# Patient Record
Sex: Male | Born: 1970 | Race: Black or African American | Hispanic: No | Marital: Single | Smoking: Never smoker
Health system: Southern US, Community
[De-identification: ages and names within clinical notes are randomized; demographics above are authoritative.]

## PROBLEM LIST (undated history)

## (undated) DIAGNOSIS — M109 Gout, unspecified: Secondary | ICD-10-CM

## (undated) DIAGNOSIS — I10 Essential (primary) hypertension: Secondary | ICD-10-CM

## (undated) DIAGNOSIS — K219 Gastro-esophageal reflux disease without esophagitis: Secondary | ICD-10-CM

## (undated) DIAGNOSIS — I728 Aneurysm of other specified arteries: Secondary | ICD-10-CM

## (undated) DIAGNOSIS — D649 Anemia, unspecified: Secondary | ICD-10-CM

## (undated) DIAGNOSIS — M87051 Idiopathic aseptic necrosis of right femur: Secondary | ICD-10-CM

## (undated) DIAGNOSIS — K279 Peptic ulcer, site unspecified, unspecified as acute or chronic, without hemorrhage or perforation: Secondary | ICD-10-CM

## (undated) DIAGNOSIS — M1990 Unspecified osteoarthritis, unspecified site: Secondary | ICD-10-CM

## (undated) DIAGNOSIS — E119 Type 2 diabetes mellitus without complications: Secondary | ICD-10-CM

## (undated) HISTORY — DX: Type 2 diabetes mellitus without complications: E11.9

## (undated) HISTORY — PX: COLONOSCOPY: SHX174

## (undated) HISTORY — DX: Anemia, unspecified: D64.9

## (undated) HISTORY — PX: KNEE ARTHROSCOPY, MEDIAL PATELLO FEMORAL LIGAMENT REPAIR: SHX1890

---

## 2001-09-20 ENCOUNTER — Emergency Department (HOSPITAL_COMMUNITY): Admission: EM | Admit: 2001-09-20 | Discharge: 2001-09-20 | Payer: Self-pay | Admitting: Emergency Medicine

## 2002-09-30 ENCOUNTER — Emergency Department (HOSPITAL_COMMUNITY): Admission: EM | Admit: 2002-09-30 | Discharge: 2002-09-30 | Payer: Self-pay

## 2002-09-30 ENCOUNTER — Encounter: Payer: Self-pay | Admitting: Emergency Medicine

## 2002-10-03 ENCOUNTER — Ambulatory Visit (HOSPITAL_BASED_OUTPATIENT_CLINIC_OR_DEPARTMENT_OTHER): Admission: RE | Admit: 2002-10-03 | Discharge: 2002-10-03 | Payer: Self-pay | Admitting: Orthopedic Surgery

## 2012-04-20 ENCOUNTER — Ambulatory Visit: Payer: Self-pay | Admitting: Physician Assistant

## 2012-09-05 ENCOUNTER — Ambulatory Visit: Payer: Self-pay | Admitting: Internal Medicine

## 2012-09-12 ENCOUNTER — Emergency Department: Payer: Self-pay | Admitting: Emergency Medicine

## 2012-09-12 LAB — BASIC METABOLIC PANEL
Anion Gap: 5 — ABNORMAL LOW (ref 7–16)
BUN: 17 mg/dL (ref 7–18)
Calcium, Total: 9.3 mg/dL (ref 8.5–10.1)
Chloride: 106 mmol/L (ref 98–107)
Co2: 28 mmol/L (ref 21–32)
Creatinine: 0.98 mg/dL (ref 0.60–1.30)
EGFR (African American): >60
EGFR (Non-African Amer.): >60
Glucose: 113 mg/dL — ABNORMAL HIGH (ref 65–99)
Osmolality: 280 (ref 275–301)
Potassium: 3.9 mmol/L (ref 3.5–5.1)
Sodium: 139 mmol/L (ref 136–145)

## 2012-09-12 LAB — TROPONIN I: Troponin-I: <0.02 ng/mL

## 2012-09-12 LAB — CBC
HCT: 39.6 % — ABNORMAL LOW (ref 40.0–52.0)
HGB: 13.7 g/dL (ref 13.0–18.0)
MCH: 32.1 pg (ref 26.0–34.0)
MCHC: 34.7 g/dL (ref 32.0–36.0)
MCV: 92 fL (ref 80–100)
Platelet: 376 10*3/uL (ref 150–440)
RBC: 4.28 10*6/uL — ABNORMAL LOW (ref 4.40–5.90)
RDW: 13.4 % (ref 11.5–14.5)
WBC: 15.3 10*3/uL — ABNORMAL HIGH (ref 3.8–10.6)

## 2012-10-21 ENCOUNTER — Ambulatory Visit: Payer: Self-pay | Admitting: Internal Medicine

## 2015-03-29 ENCOUNTER — Other Ambulatory Visit: Payer: Self-pay | Admitting: Nurse Practitioner

## 2015-03-29 DIAGNOSIS — R1084 Generalized abdominal pain: Secondary | ICD-10-CM

## 2015-04-04 ENCOUNTER — Ambulatory Visit
Admission: RE | Admit: 2015-04-04 | Discharge: 2015-04-04 | Disposition: A | Discharge status: Home / Self Care | Payer: 59 | Source: Ambulatory Visit | Attending: Nurse Practitioner | Admitting: Nurse Practitioner

## 2015-04-04 ENCOUNTER — Other Ambulatory Visit: Payer: Self-pay | Admitting: Nurse Practitioner

## 2015-04-04 DIAGNOSIS — R1909 Other intra-abdominal and pelvic swelling, mass and lump: Secondary | ICD-10-CM | POA: Diagnosis not present

## 2015-04-04 DIAGNOSIS — R1084 Generalized abdominal pain: Secondary | ICD-10-CM | POA: Diagnosis present

## 2015-04-04 DIAGNOSIS — M1611 Unilateral primary osteoarthritis, right hip: Secondary | ICD-10-CM | POA: Diagnosis not present

## 2015-04-04 HISTORY — DX: Essential (primary) hypertension: I10

## 2015-04-04 MED ORDER — IOHEXOL 350 MG/ML SOLN
100.0000 mL | Freq: Once | INTRAVENOUS | Status: AC | PRN
  Administered 2015-04-04: 100 mL via INTRAVENOUS

## 2015-11-01 DIAGNOSIS — R55 Syncope and collapse: Secondary | ICD-10-CM | POA: Insufficient documentation

## 2015-11-01 DIAGNOSIS — R0602 Shortness of breath: Secondary | ICD-10-CM | POA: Insufficient documentation

## 2015-11-01 DIAGNOSIS — D649 Anemia, unspecified: Secondary | ICD-10-CM | POA: Insufficient documentation

## 2016-05-13 DIAGNOSIS — I10 Essential (primary) hypertension: Secondary | ICD-10-CM | POA: Diagnosis not present

## 2016-05-13 DIAGNOSIS — K210 Gastro-esophageal reflux disease with esophagitis: Secondary | ICD-10-CM | POA: Diagnosis not present

## 2016-05-13 DIAGNOSIS — M25551 Pain in right hip: Secondary | ICD-10-CM | POA: Diagnosis not present

## 2016-05-13 DIAGNOSIS — R634 Abnormal weight loss: Secondary | ICD-10-CM | POA: Diagnosis not present

## 2016-11-19 DIAGNOSIS — Z0001 Encounter for general adult medical examination with abnormal findings: Secondary | ICD-10-CM | POA: Diagnosis not present

## 2016-11-19 DIAGNOSIS — L03113 Cellulitis of right upper limb: Secondary | ICD-10-CM | POA: Diagnosis not present

## 2016-11-19 DIAGNOSIS — I10 Essential (primary) hypertension: Secondary | ICD-10-CM | POA: Diagnosis not present

## 2016-11-19 DIAGNOSIS — M25541 Pain in joints of right hand: Secondary | ICD-10-CM | POA: Diagnosis not present

## 2016-11-26 ENCOUNTER — Ambulatory Visit
Admission: RE | Admit: 2016-11-26 | Discharge: 2016-11-26 | Disposition: A | Discharge status: Home / Self Care | Payer: BLUE CROSS/BLUE SHIELD | Source: Ambulatory Visit | Attending: Nurse Practitioner | Admitting: Nurse Practitioner

## 2016-11-26 ENCOUNTER — Other Ambulatory Visit: Payer: Self-pay | Admitting: Nurse Practitioner

## 2016-11-26 DIAGNOSIS — M7989 Other specified soft tissue disorders: Secondary | ICD-10-CM | POA: Diagnosis not present

## 2016-11-26 DIAGNOSIS — R937 Abnormal findings on diagnostic imaging of other parts of musculoskeletal system: Secondary | ICD-10-CM | POA: Insufficient documentation

## 2016-11-26 DIAGNOSIS — M79641 Pain in right hand: Secondary | ICD-10-CM | POA: Insufficient documentation

## 2016-11-26 DIAGNOSIS — R52 Pain, unspecified: Secondary | ICD-10-CM

## 2016-11-26 DIAGNOSIS — L03113 Cellulitis of right upper limb: Secondary | ICD-10-CM | POA: Diagnosis not present

## 2016-11-26 DIAGNOSIS — M25541 Pain in joints of right hand: Secondary | ICD-10-CM | POA: Diagnosis not present

## 2016-11-26 DIAGNOSIS — I10 Essential (primary) hypertension: Secondary | ICD-10-CM | POA: Diagnosis not present

## 2016-11-26 DIAGNOSIS — R609 Edema, unspecified: Secondary | ICD-10-CM

## 2016-11-26 DIAGNOSIS — M25531 Pain in right wrist: Secondary | ICD-10-CM | POA: Diagnosis not present

## 2016-11-27 ENCOUNTER — Other Ambulatory Visit: Payer: Self-pay | Admitting: Nurse Practitioner

## 2016-11-27 DIAGNOSIS — L03113 Cellulitis of right upper limb: Secondary | ICD-10-CM

## 2016-12-01 ENCOUNTER — Ambulatory Visit: Payer: BLUE CROSS/BLUE SHIELD | Attending: Nurse Practitioner

## 2016-12-01 DIAGNOSIS — M25431 Effusion, right wrist: Secondary | ICD-10-CM | POA: Diagnosis not present

## 2016-12-01 DIAGNOSIS — M25531 Pain in right wrist: Secondary | ICD-10-CM | POA: Diagnosis not present

## 2016-12-23 DIAGNOSIS — M1A4310 Other secondary chronic gout, right wrist, without tophus (tophi): Secondary | ICD-10-CM | POA: Diagnosis not present

## 2016-12-23 DIAGNOSIS — M25541 Pain in joints of right hand: Secondary | ICD-10-CM | POA: Diagnosis not present

## 2016-12-23 DIAGNOSIS — I10 Essential (primary) hypertension: Secondary | ICD-10-CM | POA: Diagnosis not present

## 2017-01-10 ENCOUNTER — Encounter (HOSPITAL_COMMUNITY): Payer: Self-pay

## 2017-01-10 ENCOUNTER — Emergency Department (HOSPITAL_COMMUNITY)
Admission: EM | Admit: 2017-01-10 | Discharge: 2017-01-10 | Disposition: A | Discharge status: Home / Self Care | Payer: BLUE CROSS/BLUE SHIELD | Attending: Emergency Medicine | Admitting: Emergency Medicine

## 2017-01-10 ENCOUNTER — Other Ambulatory Visit: Payer: Self-pay

## 2017-01-10 ENCOUNTER — Emergency Department (HOSPITAL_COMMUNITY): Payer: BLUE CROSS/BLUE SHIELD

## 2017-01-10 DIAGNOSIS — Y998 Other external cause status: Secondary | ICD-10-CM | POA: Insufficient documentation

## 2017-01-10 DIAGNOSIS — Y939 Activity, unspecified: Secondary | ICD-10-CM | POA: Diagnosis not present

## 2017-01-10 DIAGNOSIS — W25XXXA Contact with sharp glass, initial encounter: Secondary | ICD-10-CM | POA: Insufficient documentation

## 2017-01-10 DIAGNOSIS — Y929 Unspecified place or not applicable: Secondary | ICD-10-CM | POA: Diagnosis not present

## 2017-01-10 DIAGNOSIS — S51812A Laceration without foreign body of left forearm, initial encounter: Secondary | ICD-10-CM | POA: Diagnosis not present

## 2017-01-10 MED ORDER — LIDOCAINE HCL (PF) 1 % IJ SOLN
30.0000 mL | Freq: Once | INTRAMUSCULAR | Status: DC
  Filled 2017-01-10: qty 30

## 2017-01-10 MED ORDER — CEPHALEXIN 500 MG PO CAPS: 500.0000 mg | ORAL_CAPSULE | Freq: Four times a day (QID) | ORAL | 0 refills | Status: AC

## 2017-01-10 NOTE — ED Notes (Signed)
Patient transported to [facility] 
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
## 2017-01-18 ENCOUNTER — Encounter (INDEPENDENT_AMBULATORY_CARE_PROVIDER_SITE_OTHER): Payer: Self-pay | Admitting: Orthopaedic Surgery

## 2017-01-18 ENCOUNTER — Ambulatory Visit (INDEPENDENT_AMBULATORY_CARE_PROVIDER_SITE_OTHER): Payer: BLUE CROSS/BLUE SHIELD | Admitting: Orthopaedic Surgery

## 2017-01-18 DIAGNOSIS — S41112A Laceration without foreign body of left upper arm, initial encounter: Secondary | ICD-10-CM | POA: Diagnosis not present

## 2017-01-18 NOTE — Progress Notes (Signed)
The patient I am seeing today for the first time 8 days out from a laceration to his left forearm near the antecubital fossa but more radial and dorsal.  This laceration was repaired by the ER staff since there was no tenderness ligamentous vascular or neuro injury.  The incision was closed with small interrupted 4 oh looks like Prolene sutures.  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  He does have history of gout but no diabetes  He denies any fever, chills, nausea, vomiting or chest pain.  He denies any numbness and tingling in his hand.  On examination the incision looks well-healed.  The sutures are very teeny side to remove all of but is very meticulous because I feel that the used to small suture for such a laceration that needs only a small amount of sutures.  Distally his motor sensory exam were entirely normal there is no evidence of infection.  At this point I placed Steri-Strips and let them know that it is gaped open at all that he can treat it with just local wound care.  All questions concerns were answered and addressed.  Follow-up as needed.

## 2017-02-02 DIAGNOSIS — M87051 Idiopathic aseptic necrosis of right femur: Secondary | ICD-10-CM

## 2017-02-02 HISTORY — DX: Idiopathic aseptic necrosis of right femur: M87.051

## 2017-03-09 ENCOUNTER — Other Ambulatory Visit: Payer: Self-pay

## 2017-03-09 MED ORDER — COLCHICINE 0.6 MG PO TABS: 0.6000 mg | ORAL_TABLET | Freq: Every day | ORAL | 0 refills | Status: DC | PRN

## 2017-03-15 ENCOUNTER — Ambulatory Visit: Payer: BLUE CROSS/BLUE SHIELD | Admitting: Nurse Practitioner

## 2017-03-15 ENCOUNTER — Encounter: Payer: Self-pay | Admitting: Nurse Practitioner

## 2017-03-15 VITALS — BP 124/102 | HR 60 | Resp 16 | Ht 72.0 in | Wt 202.0 lb

## 2017-03-15 DIAGNOSIS — Z8711 Personal history of peptic ulcer disease: Secondary | ICD-10-CM | POA: Diagnosis not present

## 2017-03-15 DIAGNOSIS — M87051 Idiopathic aseptic necrosis of right femur: Secondary | ICD-10-CM | POA: Diagnosis not present

## 2017-03-15 DIAGNOSIS — I10 Essential (primary) hypertension: Secondary | ICD-10-CM

## 2017-03-15 MED ORDER — PREDNISONE 10 MG (21) PO TBPK: ORAL_TABLET | ORAL | 0 refills | Status: DC

## 2017-03-15 NOTE — Progress Notes (Signed)
[facility], [place] [address]  Internal MEDICINE  Office Visit Note  Patient Name: Evan Sullivan  [id number]  [id number]  Date of Service: 03/15/2017  Chief Complaint  Patient presents with  . Hip Pain    right side pain is going on since last thursday     Hip Pain   The incident occurred 2 days ago. The incident occurred at home. There was no injury mechanism. The pain is present in the right hip. The quality of the pain is described as aching. Associated symptoms include an inability to bear weight, a loss of motion and muscle weakness. Pertinent negatives include no numbness. The symptoms are aggravated by movement and weight bearing. He has tried immobilization (using crutches to help with mobility) for the symptoms. The treatment provided mild relief.   Pt is here for a sick visit.     Current Medication:  Outpatient Encounter Medications as of 03/15/2017  Medication Sig Note  . atenolol (TENORMIN) 100 MG tablet Take 100 mg by mouth daily.   . colchicine 0.6 MG tablet Take 1 tablet (0.6 mg total) by mouth daily as needed (gout attack).   . hydrochlorothiazide (HYDRODIURIL) 25 MG tablet Take 25 mg by mouth daily.   . pantoprazole (PROTONIX) 40 MG tablet Take 40 mg by mouth daily.   Marland Kitchen ULORIC 40 MG tablet Take 20 mg by mouth daily. 01/10/2017: X 14 days  . predniSONE (STERAPRED UNI-PAK 21 TAB) 10 MG (21) TBPK tablet 6 day taper - take by mouth as directed for 6 days    No facility-administered encounter medications on file as of 03/15/2017.       Medical History: Past Medical History:  Diagnosis Date  . Hypertension      Today's Vitals   03/15/17 1020  BP: (!) 124/102  Pulse: 60  Resp: 16  SpO2: 99%  Weight: 202 lb (91.6 kg)  Height: 6' (1.829 m)    Review of Systems  Constitutional: Positive for activity change. Negative for chills, fatigue and unexpected weight change.  HENT: Negative for congestion, postnasal drip,  rhinorrhea, sneezing and sore throat.   Eyes: Negative.  Negative for redness.  Respiratory: Negative for cough, chest tightness and shortness of breath.   Cardiovascular: Negative for chest pain and palpitations.  Gastrointestinal: Negative for abdominal pain, constipation, diarrhea, nausea and vomiting.  Endocrine: Negative for cold intolerance, heat intolerance, polydipsia and polyphagia.  Genitourinary: Negative for dysuria and frequency.  Musculoskeletal: Positive for arthralgias. Negative for back pain, joint swelling and neck pain.       Moderate to severe right hip pain. Getting slightly better since using crutch to support his weight.   Skin: Negative for rash.  Neurological: Negative.  Negative for tremors, numbness and headaches.  Hematological: Negative for adenopathy. Does not bruise/bleed easily.  Psychiatric/Behavioral: Negative for behavioral problems (Depression), sleep disturbance and suicidal ideas.    Physical Exam  Constitutional: He is oriented to person, place, and time. He appears well-developed and well-nourished.  HENT:  Head: Normocephalic.  Eyes: Pupils are equal, round, and reactive to light.  Neck: Normal range of motion. Neck supple.  Cardiovascular: Normal rate and regular rhythm.  Pulmonary/Chest: Effort normal and breath sounds normal.  Abdominal: Soft. Bowel sounds are normal. There is no tenderness.  Musculoskeletal:       Right hip: He exhibits decreased range of motion and decreased strength.       Legs: Neurological: He is alert and oriented to  person, place, and time.  Skin: Skin is warm and dry.  Psychiatric: He has a normal mood and affect. His behavior is normal. Judgment and thought content normal.  Nursing note and vitals reviewed.   Assessment/Plan: 1. Avascular necrosis of bone of hip, right ([place]) X-[place] done in 2017 per orthopedics, concerning for avascular necrosis. Patient admits decreasing alcohol intake since that visit. Will refer  back to Vance Peper, Pac for further evaluation and treatment.  - predniSONE (STERAPRED UNI-PAK 21 TAB) 10 MG (21) TBPK tablet; 6 day taper - take by mouth as directed for 6 days  Dispense: 21 tablet; Refill: 0 - Ambulatory referral to Orthopedic Surgery  2. Essential hypertension Stable. continue bp medication as prescribed.   3. History of peptic ulcer disease Continue pantoprazole as presribed.   General Counseling: [name] verbalizes understanding of the findings of todays visit and agrees with plan of treatment. I have discussed any further diagnostic evaluation that may be needed or ordered today. We also reviewed his medications today. he has been encouraged to call the office with any questions or concerns that should arise related to todays visit.   This patient was seen by Evan Pol, FNP- C in Collaboration with Dr Evan Sullivan as a part of collaborative care agreement    Orders Placed This Encounter  Procedures  . Ambulatory referral to Orthopedic Surgery    Meds ordered this encounter  Medications  . predniSONE (STERAPRED UNI-PAK 21 TAB) 10 MG (21) TBPK tablet    Sig: 6 day taper - take by mouth as directed for 6 days    Dispense:  21 tablet    Refill:  0    Order Specific Question:   Supervising Provider    Answer:   Evan Sullivan [[id number]]    Time spent: 15 Minutes

## 2017-03-16 DIAGNOSIS — M87051 Idiopathic aseptic necrosis of right femur: Secondary | ICD-10-CM | POA: Diagnosis not present

## 2017-03-16 DIAGNOSIS — M25551 Pain in right hip: Secondary | ICD-10-CM | POA: Diagnosis not present

## 2017-03-18 DIAGNOSIS — M167 Other unilateral secondary osteoarthritis of hip: Secondary | ICD-10-CM | POA: Diagnosis not present

## 2017-03-18 DIAGNOSIS — M87051 Idiopathic aseptic necrosis of right femur: Secondary | ICD-10-CM | POA: Diagnosis not present

## 2017-04-15 ENCOUNTER — Other Ambulatory Visit: Payer: Self-pay

## 2017-04-15 MED ORDER — HYDROCHLOROTHIAZIDE 25 MG PO TABS: 25.0000 mg | ORAL_TABLET | Freq: Every day | ORAL | 1 refills | Status: DC

## 2017-04-15 MED ORDER — ATENOLOL 100 MG PO TABS: 100.0000 mg | ORAL_TABLET | Freq: Every day | ORAL | 1 refills | Status: DC

## 2017-04-21 ENCOUNTER — Encounter
Admission: RE | Admit: 2017-04-21 | Discharge: 2017-04-21 | Disposition: A | Discharge status: Home / Self Care | Payer: BLUE CROSS/BLUE SHIELD | Source: Ambulatory Visit | Attending: Orthopedic Surgery | Admitting: Orthopedic Surgery

## 2017-04-21 ENCOUNTER — Other Ambulatory Visit: Payer: Self-pay

## 2017-04-21 DIAGNOSIS — R9431 Abnormal electrocardiogram [ECG] [EKG]: Secondary | ICD-10-CM | POA: Insufficient documentation

## 2017-04-21 DIAGNOSIS — Z01812 Encounter for preprocedural laboratory examination: Secondary | ICD-10-CM | POA: Insufficient documentation

## 2017-04-21 DIAGNOSIS — Z01810 Encounter for preprocedural cardiovascular examination: Secondary | ICD-10-CM | POA: Diagnosis not present

## 2017-04-21 DIAGNOSIS — I10 Essential (primary) hypertension: Secondary | ICD-10-CM | POA: Insufficient documentation

## 2017-04-21 HISTORY — DX: Gout, unspecified: M10.9

## 2017-04-21 HISTORY — DX: Unspecified osteoarthritis, unspecified site: M19.90

## 2017-04-21 LAB — COMPREHENSIVE METABOLIC PANEL
ALT: 16 U/L — ABNORMAL LOW (ref 17–63)
AST: 21 U/L (ref 15–41)
Albumin: 4.2 g/dL (ref 3.5–5.0)
Alkaline Phosphatase: 63 U/L (ref 38–126)
Anion gap: 9 (ref 5–15)
BUN: 13 mg/dL (ref 6–20)
CO2: 26 mmol/L (ref 22–32)
Calcium: 9.9 mg/dL (ref 8.9–10.3)
Chloride: 103 mmol/L (ref 101–111)
Creatinine, Ser: 0.93 mg/dL (ref 0.61–1.24)
GFR calc Af Amer: >60 mL/min (ref >60)
GFR calc non Af Amer: >60 mL/min (ref >60)
Glucose, Bld: 108 mg/dL — ABNORMAL HIGH (ref 65–99)
Potassium: 3.7 mmol/L (ref 3.5–5.1)
Sodium: 138 mmol/L (ref 135–145)
Total Bilirubin: 0.7 mg/dL (ref 0.3–1.2)
Total Protein: 7.9 g/dL (ref 6.5–8.1)

## 2017-04-21 LAB — URINALYSIS, ROUTINE W REFLEX MICROSCOPIC
Bacteria, UA: NONE SEEN
Bilirubin Urine: NEGATIVE
Glucose, UA: NEGATIVE mg/dL
Hgb urine dipstick: NEGATIVE
Ketones, ur: NEGATIVE mg/dL
Leukocytes, UA: NEGATIVE
Nitrite: NEGATIVE
Protein, ur: 100 mg/dL — AB
RBC / HPF: NONE SEEN RBC/hpf (ref 0–5)
Specific Gravity, Urine: 1.016 (ref 1.005–1.030)
WBC, UA: NONE SEEN WBC/hpf (ref 0–5)
pH: 5 (ref 5.0–8.0)

## 2017-04-21 LAB — CBC
HCT: 45 % (ref 40.0–52.0)
Hemoglobin: 15.5 g/dL (ref 13.0–18.0)
MCH: 31.9 pg (ref 26.0–34.0)
MCHC: 34.5 g/dL (ref 32.0–36.0)
MCV: 92.5 fL (ref 80.0–100.0)
Platelets: 299 10*3/uL (ref 150–440)
RBC: 4.86 MIL/uL (ref 4.40–5.90)
RDW: 13.5 % (ref 11.5–14.5)
WBC: 7 10*3/uL (ref 3.8–10.6)

## 2017-04-21 LAB — SURGICAL PCR SCREEN
MRSA, PCR: NEGATIVE
Staphylococcus aureus: NEGATIVE

## 2017-04-21 LAB — APTT: aPTT: 32 seconds (ref 24–36)

## 2017-04-21 LAB — TYPE AND SCREEN
ABO/RH(D): O POS
Antibody Screen: NEGATIVE

## 2017-04-21 LAB — C-REACTIVE PROTEIN: CRP: 0.8 mg/dL (ref <1.0)

## 2017-04-21 LAB — SEDIMENTATION RATE: Sed Rate: 29 mm/hr — ABNORMAL HIGH (ref 0–15)

## 2017-04-21 LAB — PROTIME-INR
INR: 0.92
Prothrombin Time: 12.3 seconds (ref 11.4–15.2)

## 2017-04-21 NOTE — Patient Instructions (Signed)
Your procedure is scheduled on: 05/03/17 Mon Report to Same Day Surgery 2nd floor medical mall [facility] Entrance-take elevator on left to 2nd floor.  Check in with surgery information desk.) To find out your arrival time please call [phone] between 1PM - 3PM on 04/30/17 Fri  Remember: Instructions that are not followed completely may result in serious medical risk, up to and including death, or upon the discretion of your surgeon and anesthesiologist your surgery may need to be rescheduled.    _x___ 1. Do not eat food after midnight the night before your procedure. You may drink clear liquids up to 2 hours before you are scheduled to arrive at the hospital for your procedure.  Do not drink clear liquids within 2 hours of your scheduled arrival to the hospital.  Clear liquids include  --Water or Apple juice without pulp  --Clear carbohydrate beverage such as ClearFast or Gatorade  --Black Coffee or Clear Tea (No milk, no creamers, do not add anything to                  the coffee or Tea Type 1 and type 2 diabetics should only drink water.  No gum chewing or hard candies.     __x__ 2. No Alcohol for 24 hours before or after surgery.   __x__3. No Smoking or e-cigarettes for 24 prior to surgery.  Do not use any chewable tobacco products for at least 6 hour prior to surgery   ____  4. Bring all medications with you on the day of surgery if instructed.    __x__ 5. Notify your doctor if there is any change in your medical condition     (cold, fever, infections).    x___6. On the morning of surgery brush your teeth with toothpaste and water.  You may rinse your mouth with mouth wash if you wish.  Do not swallow any toothpaste or mouthwash.   Do not wear jewelry, make-up, hairpins, clips or nail polish.  Do not wear lotions, powders, or perfumes. You may wear deodorant.  Do not shave 48 hours prior to surgery. Men may shave face and neck.  Do not bring valuables to the hospital.     [facility] is not responsible for any belongings or valuables.               Contacts, dentures or bridgework may not be worn into surgery.  Leave your suitcase in the car. After surgery it may be brought to your room.  For patients admitted to the hospital, discharge time is determined by your                       treatment team.  _  Patients discharged the day of surgery will not be allowed to drive home.  You will need someone to drive you home and stay with you the night of your procedure.    Please read over the following fact sheets that you were given:   [facility] Preparing for Surgery and or MRSA Information   _x___ Take anti-hypertensive listed below, cardiac, seizure, asthma,     anti-reflux and psychiatric medicines. These include:  1. atenolol (TENORMIN) 100 MG tablet  2.pantoprazole (PROTONIX) 40 MG tablet  3.  4.  5.  6.  ____Fleets enema or Magnesium Citrate as directed.   _x___ Use CHG Soap or sage wipes as directed on instruction sheet   ____ Use inhalers on the day of surgery  and bring to hospital day of surgery  ____ Stop Metformin and Janumet 2 days prior to surgery.    ____ Take 1/2 of usual insulin dose the night before surgery and none on the morning     surgery.   _x___ Follow recommendations from Cardiologist, Pulmonologist or PCP regarding          stopping Aspirin, Coumadin, Plavix ,Eliquis, Effient, or Pradaxa, and Pletal.  X____Stop Anti-inflammatories such as Advil, Aleve, Ibuprofen, Motrin, Naproxen, Naprosyn, Goodies powders or aspirin products. OK to take Tylenol and                          Celebrex.   _x___ Stop supplements until after surgery.  But may continue Vitamin D, Vitamin B,       and multivitamin.   ____ Bring C-Pap to the hospital.

## 2017-04-21 NOTE — Pre-Procedure Instructions (Addendum)
AS INSTRUCTED BY DR Ronelle Nigh, EKG/CARDIAC CLEARANCE REQUEST FAXED TO DR Hans Eden. LM FOR TIFFANY

## 2017-04-22 LAB — URINE CULTURE
Culture: NO GROWTH
Special Requests: NORMAL

## 2017-04-23 DIAGNOSIS — I208 Other forms of angina pectoris: Secondary | ICD-10-CM | POA: Diagnosis not present

## 2017-04-23 DIAGNOSIS — R0602 Shortness of breath: Secondary | ICD-10-CM | POA: Diagnosis not present

## 2017-05-03 ENCOUNTER — Encounter: Admission: RE | Payer: Self-pay | Source: Ambulatory Visit

## 2017-05-03 ENCOUNTER — Inpatient Hospital Stay
Admission: RE | Admit: 2017-05-03 | Payer: BLUE CROSS/BLUE SHIELD | Source: Ambulatory Visit | Admitting: Orthopedic Surgery

## 2017-05-03 SURGERY — ARTHROPLASTY, HIP, TOTAL,POSTERIOR APPROACH
Anesthesia: Choice | Laterality: Right

## 2017-05-10 DIAGNOSIS — I208 Other forms of angina pectoris: Secondary | ICD-10-CM | POA: Diagnosis not present

## 2017-05-10 DIAGNOSIS — R0602 Shortness of breath: Secondary | ICD-10-CM | POA: Diagnosis not present

## 2017-05-24 ENCOUNTER — Other Ambulatory Visit: Payer: Self-pay

## 2017-05-24 MED ORDER — COLCHICINE 0.6 MG PO TABS: 0.6000 mg | ORAL_TABLET | Freq: Every day | ORAL | 0 refills | Status: DC | PRN

## 2017-05-26 ENCOUNTER — Telehealth: Payer: Self-pay | Admitting: Nurse Practitioner

## 2017-05-26 ENCOUNTER — Other Ambulatory Visit: Payer: Self-pay | Admitting: Nurse Practitioner

## 2017-05-26 MED ORDER — COLCHICINE 0.6 MG PO CAPS: 1.0000 | ORAL_CAPSULE | Freq: Every day | ORAL | 1 refills | Status: DC

## 2017-05-26 NOTE — Telephone Encounter (Signed)
Prior authorization is approved for Mitigare 0.6

## 2017-05-26 NOTE — Telephone Encounter (Signed)
Prior authorization for colchicine has been approved.

## 2017-07-20 ENCOUNTER — Encounter
Admission: RE | Admit: 2017-07-20 | Discharge: 2017-07-20 | Disposition: A | Discharge status: Home / Self Care | Payer: BLUE CROSS/BLUE SHIELD | Source: Ambulatory Visit | Attending: Orthopedic Surgery | Admitting: Orthopedic Surgery

## 2017-07-20 ENCOUNTER — Other Ambulatory Visit: Payer: Self-pay

## 2017-07-20 DIAGNOSIS — Z01812 Encounter for preprocedural laboratory examination: Secondary | ICD-10-CM | POA: Diagnosis not present

## 2017-07-20 HISTORY — DX: Idiopathic aseptic necrosis of right femur: M87.051

## 2017-07-20 HISTORY — DX: Gastro-esophageal reflux disease without esophagitis: K21.9

## 2017-07-20 HISTORY — DX: Peptic ulcer, site unspecified, unspecified as acute or chronic, without hemorrhage or perforation: K27.9

## 2017-07-20 LAB — COMPREHENSIVE METABOLIC PANEL
ALT: 21 U/L (ref 17–63)
AST: 24 U/L (ref 15–41)
Albumin: 4.3 g/dL (ref 3.5–5.0)
Alkaline Phosphatase: 62 U/L (ref 38–126)
Anion gap: 9 (ref 5–15)
BUN: 16 mg/dL (ref 6–20)
CO2: 24 mmol/L (ref 22–32)
Calcium: 9.8 mg/dL (ref 8.9–10.3)
Chloride: 105 mmol/L (ref 101–111)
Creatinine, Ser: 0.94 mg/dL (ref 0.61–1.24)
GFR calc Af Amer: >60 mL/min (ref >60)
GFR calc non Af Amer: >60 mL/min (ref >60)
Glucose, Bld: 110 mg/dL — ABNORMAL HIGH (ref 65–99)
Potassium: 3.7 mmol/L (ref 3.5–5.1)
Sodium: 138 mmol/L (ref 135–145)
Total Bilirubin: 0.7 mg/dL (ref 0.3–1.2)
Total Protein: 8.3 g/dL — ABNORMAL HIGH (ref 6.5–8.1)

## 2017-07-20 LAB — URINALYSIS, ROUTINE W REFLEX MICROSCOPIC
Bacteria, UA: NONE SEEN
Bilirubin Urine: NEGATIVE
Glucose, UA: NEGATIVE mg/dL
Hgb urine dipstick: NEGATIVE
Ketones, ur: NEGATIVE mg/dL
Leukocytes, UA: NEGATIVE
Nitrite: NEGATIVE
Protein, ur: 100 mg/dL — AB
Specific Gravity, Urine: 1.019 (ref 1.005–1.030)
pH: 5 (ref 5.0–8.0)

## 2017-07-20 LAB — TYPE AND SCREEN
ABO/RH(D): O POS
Antibody Screen: NEGATIVE

## 2017-07-20 LAB — APTT: aPTT: 32 seconds (ref 24–36)

## 2017-07-20 LAB — SURGICAL PCR SCREEN
MRSA, PCR: NEGATIVE
Staphylococcus aureus: NEGATIVE

## 2017-07-20 LAB — CBC
HCT: 45.3 % (ref 40.0–52.0)
Hemoglobin: 15.7 g/dL (ref 13.0–18.0)
MCH: 32.6 pg (ref 26.0–34.0)
MCHC: 34.7 g/dL (ref 32.0–36.0)
MCV: 94 fL (ref 80.0–100.0)
Platelets: 284 10*3/uL (ref 150–440)
RBC: 4.81 MIL/uL (ref 4.40–5.90)
RDW: 13.5 % (ref 11.5–14.5)
WBC: 6.6 10*3/uL (ref 3.8–10.6)

## 2017-07-20 LAB — PROTIME-INR
INR: 0.86
Prothrombin Time: 11.6 seconds (ref 11.4–15.2)

## 2017-07-20 LAB — SEDIMENTATION RATE: Sed Rate: 17 mm/hr — ABNORMAL HIGH (ref 0–15)

## 2017-07-20 LAB — C-REACTIVE PROTEIN: CRP: <0.8 mg/dL (ref <1.0)

## 2017-07-20 NOTE — Patient Instructions (Signed)
Your procedure is scheduled on: Monday, August 02, 2017  Report to [place]    DO NOT STOP ON THE FIRST FLOOR TO REGISTER  To find out your arrival time please call [phone] between 1PM - 3PM on Friday, July 30, 2017  Remember: Instructions that are not followed completely may result in serious medical risk, p to and including death, or upon the discretion of your surgeon and anesthesiologist your  surgery may need to be rescheduled.     _X__ 1. Do not eat food after midnight the night before your procedure.                 No gum chewing or hard candies. NOTHING SOLID IN YOUR MOUTH                     AFTER MIDNIGHT.                 You may drink clear liquids up to 2 hours before you are scheduled to arrive for your surgery                DO not drink clear liquids within 2 hours of the start of your surgery.                  Clear Liquids include:  water, apple juice without pulp, clear carbohydrate                 drink such as Clearfast of Gatorade, Black Coffee or Tea (Do not add                 anything to coffee or tea).  __X__2.  On the morning of surgery brush your teeth with toothpaste and water,                     you  may rinse your mouth with mouthwash if you wish.                         Do not swallow any toothpaste of mouthwash.     _X__ 3.  No Alcohol for 24 hours before or after surgery.   __ 4.  Do Not Smoke or use e-cigarettes For 24 Hours Prior to Your Surgery.                 Do not use any chewable tobacco products for at least 6 hours prior to                 surgery.  ____  5.  Bring all medications with you on the day of surgery if instructed.   __x__  6.  Notify your doctor if there is any change in your medical condition      (cold, fever, infections).     Do not wear jewelry, make-up, hairpins, clips or nail polish. Do not wear lotions, powders, or perfumes. You may wear deodorant. Do not shave  48 hours prior to surgery. Men may shave face and neck. Do not bring valuables to the hospital.    [facility] is not responsible for any belongings or valuables.  Contacts, dentures or bridgework may not be worn into surgery. Leave your suitcase in the car. After surgery it may be brought to your room. For patients admitted to the hospital, discharge time is determined by your treatment team.   Patients discharged the  day of surgery will not be allowed to drive home.   Please read over the following fact sheets that you were given:   Preparing for surgery    ____ Take these medicines the morning of surgery with A SIP OF WATER:    1. PANTOPRAZOLE  2. ATENOLOL  3. COLCHICINE, ONLY IF YOU HAVE A CURRENT GOUT OUTBREAK  4.  5.  6.  ____ Fleet Enema (as directed)   __X__ Use CHG Soap as directed  ____ Use inhalers on the day of surgery  ____ Stop ALL ASPIRIN PRODUCTS TODAY.                                   YOU MAY TAKE TYLENOL AT ANY TIME   ____ Stop Anti-inflammatories AS OF TODAY   ____ Stop supplements until after surgery.    ____ Bring C-Pap to the hospital.   HAVE STOOL SOFTENERS READY FOR USE UPON DISCHARGE.       YOU SHOULD START THIS ON THE DAY BEFORE SURGERY BUT DO NOT             TAKE ON THE DAY OF SURGERY.  Arnold

## 2017-07-20 NOTE — Pre-Procedure Instructions (Signed)
BP remained elevated during pre-op interview.  Left room and allowed patient to recline and relax to see if numbers would go down. States he took his bp medicine this morning. Repeat blood pressure after quiet and calm environment remains with diastolic over 327.  Faxed request to [facility] for medical clearance

## 2017-07-21 LAB — URINE CULTURE
Culture: <10000 — AB
Special Requests: NORMAL

## 2017-07-28 ENCOUNTER — Encounter: Payer: Self-pay | Admitting: Internal Medicine

## 2017-07-28 ENCOUNTER — Ambulatory Visit: Payer: BLUE CROSS/BLUE SHIELD | Admitting: Internal Medicine

## 2017-07-28 VITALS — BP 170/100 | HR 63 | Resp 16 | Ht 72.0 in | Wt 210.4 lb

## 2017-07-28 DIAGNOSIS — I10 Essential (primary) hypertension: Secondary | ICD-10-CM | POA: Diagnosis not present

## 2017-07-28 DIAGNOSIS — M87051 Idiopathic aseptic necrosis of right femur: Secondary | ICD-10-CM | POA: Diagnosis not present

## 2017-07-28 MED ORDER — AMLODIPINE BESYLATE 5 MG PO TABS: 5.0000 mg | ORAL_TABLET | Freq: Every day | ORAL | 3 refills | Status: DC

## 2017-07-28 NOTE — Progress Notes (Signed)
[facility] [place], [place] [address]  Internal MEDICINE  Office Visit Note  Patient Name: Evan Sullivan  [id number]  [id number]  Date of Service: 08/16/2017  Chief Complaint  Patient presents with  . Hypertension    BP is uncontrolled, pt needs surgical clearance  Pt is here for routine follow up. His bp is not well controlled, he is scheduled to have hip arthoplasty next week, pt admits taking his Atenolol. C/o ED.  Hypertension  This is a chronic problem. The current episode started more than 1 year ago. The problem has been waxing and waning since onset. The problem is uncontrolled. Associated symptoms include headaches and shortness of breath. Pertinent negatives include no chest pain, neck pain or palpitations. Compliance problems include medication side effects.    Current Medication: Outpatient Encounter Medications as of 07/28/2017  Medication Sig  . acetaminophen (TYLENOL) 500 MG tablet Take 1,000 mg by mouth daily as needed for moderate pain.  Marland Kitchen atenolol (TENORMIN) 100 MG tablet Take 1 tablet (100 mg total) by mouth daily.  . colchicine (COLCRYS) 0.6 MG tablet Take 0.6 mg by mouth daily.  . hydrochlorothiazide (HYDRODIURIL) 25 MG tablet Take 1 tablet (25 mg total) by mouth daily.  . hydrocortisone cream 1 % Apply 1 application topically daily as needed (rash).  . pantoprazole (PROTONIX) 40 MG tablet Take 40 mg by mouth daily as needed (acid reflux).   . Colchicine (MITIGARE) 0.6 MG CAPS Take 1 capsule by mouth daily for 1 dose. (Patient taking differently: Take 1 capsule by mouth 2 (two) times daily as needed (gout). )  . [DISCONTINUED] amLODipine (NORVASC) 5 MG tablet Take 1 tablet (5 mg total) by mouth daily.   No facility-administered encounter medications on file as of 07/28/2017.     Surgical History: Past Surgical History:  Procedure Laterality Date  . COLONOSCOPY    . KNEE ARTHROSCOPY, MEDIAL PATELLO FEMORAL LIGAMENT REPAIR Bilateral  2004,2000  . TOTAL HIP ARTHROPLASTY Right 08/02/2017   Procedure: TOTAL HIP ARTHROPLASTY;  Surgeon: Dereck Leep, MD;  Location: [facility] ORS;  Service: Orthopedics;  Laterality: Right;    Medical History: Past Medical History:  Diagnosis Date  . Arthritis   . Avascular necrosis of femoral head, right ([place]) 2019  . GERD (gastroesophageal reflux disease)   . Gout   . Hypertension   . Peptic ulcer disease     Family History: Family History  Problem Relation Age of Onset  . Hypertension Mother   . Hypertension Father   . Colon cancer Father     Social History   Socioeconomic History  . Marital status: Single    Spouse name: Not on file  . Number of children: Not on file  . Years of education: Not on file  . Highest education level: Not on file  Occupational History  . Not on file  Social Needs  . Financial resource strain: Not on file  . Food insecurity:    Worry: Not on file    Inability: Not on file  . Transportation needs:    Medical: Not on file    Non-medical: Not on file  Tobacco Use  . Smoking status: Never Smoker  . Smokeless tobacco: Never Used  Substance and Sexual Activity  . Alcohol use: Yes    Alcohol/week: 3.6 oz    Types: 6 Cans of beer per week    Comment: 2 cans of beer most days  . Drug use: Yes    Types:  Marijuana  . Sexual activity: Not on file  Lifestyle  . Physical activity:    Days per week: Not on file    Minutes per session: Not on file  . Stress: Not on file  Relationships  . Social connections:    Talks on phone: Not on file    Gets together: Not on file    Attends religious service: Not on file    Active member of club or organization: Not on file    Attends meetings of clubs or organizations: Not on file    Relationship status: Not on file  . Intimate partner violence:    Fear of current or ex partner: Not on file    Emotionally abused: Not on file    Physically abused: Not on file    Forced sexual activity: Not on file   Other Topics Concern  . Not on file  Social History Narrative  . Not on file      Review of Systems  Constitutional: Negative for chills, fatigue and unexpected weight change.  HENT: Positive for postnasal drip. Negative for congestion, rhinorrhea, sneezing and sore throat.   Eyes: Negative for redness.  Respiratory: Positive for shortness of breath. Negative for cough and chest tightness.   Cardiovascular: Negative for chest pain and palpitations.  Gastrointestinal: Negative for abdominal pain, constipation, diarrhea, nausea and vomiting.  Genitourinary: Negative for dysuria and frequency.  Musculoskeletal: Negative for arthralgias, back pain, joint swelling and neck pain.  Skin: Negative for rash.  Neurological: Positive for headaches. Negative for tremors and numbness.  Hematological: Negative for adenopathy. Does not bruise/bleed easily.  Psychiatric/Behavioral: Negative for behavioral problems (Depression), sleep disturbance and suicidal ideas. The patient is not nervous/anxious.     Vital Signs: BP (!) 170/100   Pulse 63   Resp 16   Ht 6' (1.829 m)   Wt 210 lb 6.4 oz (95.4 kg)   SpO2 98%   BMI 28.54 kg/m    Physical Exam  Constitutional: He is oriented to person, place, and time. He appears well-developed and well-nourished. No distress.  HENT:  Head: Normocephalic and atraumatic.  Mouth/Throat: Oropharynx is clear and moist. No oropharyngeal exudate.  Eyes: Pupils are equal, round, and reactive to light. EOM are normal.  Neck: Normal range of motion. Neck supple. No JVD present. No tracheal deviation present. No thyromegaly present.  Cardiovascular: Normal rate, regular rhythm and normal heart sounds. Exam reveals no gallop and no friction rub.  No murmur heard. Pulmonary/Chest: Effort normal. No respiratory distress. He has no wheezes. He has no rales. He exhibits no tenderness.  Abdominal: Soft. Bowel sounds are normal.  Musculoskeletal: Normal range of motion.   Lymphadenopathy:    He has no cervical adenopathy.  Neurological: He is alert and oriented to person, place, and time. No cranial nerve deficit.  Skin: Skin is warm and dry. He is not diaphoretic.  Psychiatric: He has a normal mood and affect. His behavior is normal. Judgment and thought content normal.   Assessment/Plan: 1. Essential hypertension Continue atenolol and add norvasc, will increase from 5 mg to 10 mg,  will recheck his bp here in the office   2. Avascular necrosis of bone of hip, right ([place]) Continue with surgery as scheduled   General Counseling: Heriberto verbalizes understanding of the findings of todays visit and agrees with plan of treatment. I have discussed any further diagnostic evaluation that may be needed or ordered today. We also reviewed his medications today. he has been  encouraged to call the office with any questions or concerns that should arise related to todays visit. Hypertension Counseling:   The following hypertensive lifestyle modification were recommended and discussed:  1. Limiting alcohol intake to less than 1 oz/day of ethanol:(24 oz of beer or 8 oz of wine or 2 oz of 100-proof whiskey). 2. Take baby ASA 81 mg daily. 3. Importance of regular aerobic exercise and losing weight. 4. Reduce dietary saturated fat and cholesterol intake for overall cardiovascular health. 5. Maintaining adequate dietary potassium, calcium, and magnesium intake. 6. Regular monitoring of the blood pressure. 7. Reduce sodium intake to less than 100 mmol/day (less than 2.3 gm of sodium or less than 6 gm of sodium choride)     Meds ordered this encounter  Medications  . DISCONTD: amLODipine (NORVASC) 5 MG tablet    Sig: Take 1 tablet (5 mg total) by mouth daily.    Dispense:  30 tablet    Refill:  3    Time spent:20 Minutes  Dr Lavera Guise Internal medicine

## 2017-07-29 DIAGNOSIS — M25551 Pain in right hip: Secondary | ICD-10-CM | POA: Diagnosis not present

## 2017-07-30 ENCOUNTER — Ambulatory Visit: Payer: BLUE CROSS/BLUE SHIELD

## 2017-07-30 VITALS — BP 152/102 | HR 63 | Resp 16 | Ht 76.0 in | Wt 210.0 lb

## 2017-07-30 DIAGNOSIS — Z0130 Encounter for examination of blood pressure without abnormal findings: Secondary | ICD-10-CM

## 2017-08-01 ENCOUNTER — Encounter: Payer: Self-pay | Admitting: Orthopedic Surgery

## 2017-08-01 NOTE — Discharge Instructions (Signed)
Instructions after Total Hip Replacement ° ° °  Diksha Tagliaferro P. Delina Kruczek, Jr., M.D.    ° Dept. of Orthopaedics & Sports Medicine ° [facility] ° [address] ° [place], [place]  [address] ° Phone: [phone]   Fax: [phone] ° °  °DIET: °• Drink plenty of non-alcoholic fluids. °• Resume your normal diet. Include foods high in fiber. ° °ACTIVITY:  °• You may use crutches or a Man with weight-bearing as tolerated, unless instructed otherwise. °• You may be weaned off of the [place] or crutches by your Physical Therapist.  °• Do NOT reach below the level of your knees or cross your legs until allowed.    °• Continue doing gentle exercises. Exercising will reduce the pain and swelling, increase motion, and prevent muscle weakness.   °• Please continue to use the TED compression stockings for 6 weeks. You may remove the stockings at night, but should reapply them in the morning. °• Do not drive or operate any equipment until instructed. ° °WOUND CARE:  °• Continue to use ice packs periodically to reduce pain and swelling. °• Keep the incision clean and dry. °• You may bathe or shower after the staples are removed at the first office visit following surgery. ° °MEDICATIONS: °• You may resume your regular medications. °• Please take the pain medication as prescribed on the medication. °• Do not take pain medication on an empty stomach. °• You have been given a prescription for a blood thinner to prevent blood clots. Please take the medication as instructed. (NOTE: After completing a 2 week course of Lovenox, take one Enteric-coated aspirin once a day.) °• Pain medications and iron supplements can cause constipation. Use a stool softener (Senokot or Colace) on a daily basis and a laxative (dulcolax or miralax) as needed. °• Do not drive or drink alcoholic beverages when taking pain medications. ° °CALL THE OFFICE FOR: °• Temperature above 101 degrees °• Excessive bleeding or drainage on the dressing. °• Excessive  swelling, coldness, or paleness of the toes. °• Persistent nausea and vomiting. ° °FOLLOW-UP:  °• You should have an appointment to return to the office in 6 weeks after surgery. °• Arrangements have been made for continuation of Physical Therapy (either home therapy or outpatient therapy). °  °

## 2017-08-02 ENCOUNTER — Inpatient Hospital Stay: Payer: BLUE CROSS/BLUE SHIELD

## 2017-08-02 ENCOUNTER — Inpatient Hospital Stay
Admission: RE | Admit: 2017-08-02 | Discharge: 2017-08-04 | DRG: 470 | Disposition: A | Discharge status: Home / Self Care | Payer: BLUE CROSS/BLUE SHIELD | Source: Ambulatory Visit | Attending: Orthopedic Surgery | Admitting: Orthopedic Surgery

## 2017-08-02 ENCOUNTER — Other Ambulatory Visit: Payer: Self-pay

## 2017-08-02 ENCOUNTER — Encounter
Admission: RE | Disposition: A | Discharge status: Home / Self Care | Payer: Self-pay | Source: Ambulatory Visit | Attending: Orthopedic Surgery

## 2017-08-02 ENCOUNTER — Encounter: Payer: Self-pay | Admitting: *Deleted

## 2017-08-02 DIAGNOSIS — K219 Gastro-esophageal reflux disease without esophagitis: Secondary | ICD-10-CM | POA: Diagnosis not present

## 2017-08-02 DIAGNOSIS — Z79899 Other long term (current) drug therapy: Secondary | ICD-10-CM

## 2017-08-02 DIAGNOSIS — Z471 Aftercare following joint replacement surgery: Secondary | ICD-10-CM | POA: Diagnosis not present

## 2017-08-02 DIAGNOSIS — I10 Essential (primary) hypertension: Secondary | ICD-10-CM | POA: Diagnosis not present

## 2017-08-02 DIAGNOSIS — M87851 Other osteonecrosis, right femur: Secondary | ICD-10-CM | POA: Diagnosis present

## 2017-08-02 DIAGNOSIS — I96 Gangrene, not elsewhere classified: Secondary | ICD-10-CM | POA: Diagnosis not present

## 2017-08-02 DIAGNOSIS — M1611 Unilateral primary osteoarthritis, right hip: Principal | ICD-10-CM | POA: Diagnosis present

## 2017-08-02 DIAGNOSIS — M109 Gout, unspecified: Secondary | ICD-10-CM | POA: Diagnosis present

## 2017-08-02 DIAGNOSIS — Z96641 Presence of right artificial hip joint: Secondary | ICD-10-CM | POA: Diagnosis not present

## 2017-08-02 DIAGNOSIS — M879 Osteonecrosis, unspecified: Secondary | ICD-10-CM | POA: Diagnosis not present

## 2017-08-02 DIAGNOSIS — M167 Other unilateral secondary osteoarthritis of hip: Secondary | ICD-10-CM | POA: Diagnosis not present

## 2017-08-02 DIAGNOSIS — Z96649 Presence of unspecified artificial hip joint: Secondary | ICD-10-CM | POA: Diagnosis not present

## 2017-08-02 HISTORY — PX: TOTAL HIP ARTHROPLASTY: SHX124

## 2017-08-02 LAB — URINE DRUG SCREEN, QUALITATIVE (ARMC ONLY)
Amphetamines, Ur Screen: NOT DETECTED
Benzodiazepine, Ur Scrn: NOT DETECTED
Cannabinoid 50 Ng, Ur ~~LOC~~: POSITIVE — AB
Cocaine Metabolite,Ur ~~LOC~~: NOT DETECTED
MDMA (Ecstasy)Ur Screen: NOT DETECTED
Methadone Scn, Ur: NOT DETECTED
Opiate, Ur Screen: NOT DETECTED
Phencyclidine (PCP) Ur S: NOT DETECTED
Tricyclic, Ur Screen: NOT DETECTED

## 2017-08-02 SURGERY — ARTHROPLASTY, HIP, TOTAL,POSTERIOR APPROACH
Anesthesia: Spinal | Site: Hip | Laterality: Right | Wound class: Clean

## 2017-08-02 MED ORDER — FAMOTIDINE 20 MG PO TABS
ORAL_TABLET | ORAL | Status: AC
  Administered 2017-08-02: 20 mg
  Filled 2017-08-02: qty 1

## 2017-08-02 MED ORDER — CELECOXIB 200 MG PO CAPS
ORAL_CAPSULE | ORAL | Status: AC
  Administered 2017-08-02: 400 mg via ORAL
  Filled 2017-08-02: qty 2

## 2017-08-02 MED ORDER — DEXMEDETOMIDINE HCL IN NACL 80 MCG/20ML IV SOLN
INTRAVENOUS | Status: AC
  Filled 2017-08-02: qty 20

## 2017-08-02 MED ORDER — SENNOSIDES-DOCUSATE SODIUM 8.6-50 MG PO TABS
1.0000 | ORAL_TABLET | Freq: Two times a day (BID) | ORAL | Status: DC
  Administered 2017-08-02 – 2017-08-04 (×4): 1 via ORAL
  Filled 2017-08-02 (×4): qty 1

## 2017-08-02 MED ORDER — MENTHOL 3 MG MT LOZG
1.0000 | LOZENGE | OROMUCOSAL | Status: DC | PRN
  Filled 2017-08-02: qty 9

## 2017-08-02 MED ORDER — GABAPENTIN 300 MG PO CAPS
300.0000 mg | ORAL_CAPSULE | Freq: Every day | ORAL | Status: DC
  Administered 2017-08-02 – 2017-08-03 (×2): 300 mg via ORAL
  Filled 2017-08-02 (×2): qty 1

## 2017-08-02 MED ORDER — NEOMYCIN-POLYMYXIN B GU 40-200000 IR SOLN
Status: DC | PRN
  Administered 2017-08-02: 2 mL

## 2017-08-02 MED ORDER — MIDAZOLAM HCL 5 MG/5ML IJ SOLN
INTRAMUSCULAR | Status: DC | PRN
  Administered 2017-08-02 (×2): 2 mg via INTRAVENOUS

## 2017-08-02 MED ORDER — MIDAZOLAM HCL 2 MG/2ML IJ SOLN
INTRAMUSCULAR | Status: AC
  Filled 2017-08-02: qty 2

## 2017-08-02 MED ORDER — FENTANYL CITRATE (PF) 100 MCG/2ML IJ SOLN
INTRAMUSCULAR | Status: DC | PRN
  Administered 2017-08-02 (×2): 50 ug via INTRAVENOUS

## 2017-08-02 MED ORDER — HYDROMORPHONE HCL 1 MG/ML IJ SOLN: 0.5000 mg | INTRAMUSCULAR | Status: DC | PRN

## 2017-08-02 MED ORDER — HYDROCORTISONE 1 % EX CREA
1.0000 "application " | TOPICAL_CREAM | Freq: Every day | CUTANEOUS | Status: DC | PRN
  Filled 2017-08-02: qty 28

## 2017-08-02 MED ORDER — ACETAMINOPHEN 10 MG/ML IV SOLN
INTRAVENOUS | Status: DC | PRN
  Administered 2017-08-02: 1000 mg via INTRAVENOUS

## 2017-08-02 MED ORDER — EPHEDRINE SULFATE 50 MG/ML IJ SOLN
INTRAMUSCULAR | Status: AC
  Filled 2017-08-02: qty 1

## 2017-08-02 MED ORDER — PROPOFOL 500 MG/50ML IV EMUL
INTRAVENOUS | Status: AC
  Filled 2017-08-02: qty 50

## 2017-08-02 MED ORDER — ACETAMINOPHEN 325 MG PO TABS: 325.0000 mg | ORAL_TABLET | Freq: Four times a day (QID) | ORAL | Status: DC | PRN

## 2017-08-02 MED ORDER — CEFAZOLIN SODIUM-DEXTROSE 2-4 GM/100ML-% IV SOLN
2.0000 g | Freq: Four times a day (QID) | INTRAVENOUS | Status: AC
  Administered 2017-08-02 – 2017-08-03 (×4): 2 g via INTRAVENOUS
  Filled 2017-08-02 (×4): qty 100

## 2017-08-02 MED ORDER — ALUM & MAG HYDROXIDE-SIMETH 200-200-20 MG/5ML PO SUSP: 30.0000 mL | ORAL | Status: DC | PRN

## 2017-08-02 MED ORDER — FENTANYL CITRATE (PF) 100 MCG/2ML IJ SOLN: 25.0000 ug | INTRAMUSCULAR | Status: DC | PRN

## 2017-08-02 MED ORDER — PANTOPRAZOLE SODIUM 40 MG PO TBEC
40.0000 mg | DELAYED_RELEASE_TABLET | Freq: Two times a day (BID) | ORAL | Status: DC
  Administered 2017-08-02 – 2017-08-04 (×4): 40 mg via ORAL
  Filled 2017-08-02 (×4): qty 1

## 2017-08-02 MED ORDER — PHENYLEPHRINE HCL 10 MG/ML IJ SOLN
INTRAMUSCULAR | Status: DC | PRN
  Administered 2017-08-02: 100 ug via INTRAVENOUS

## 2017-08-02 MED ORDER — METOCLOPRAMIDE HCL 5 MG/ML IJ SOLN: 5.0000 mg | Freq: Three times a day (TID) | INTRAMUSCULAR | Status: DC | PRN

## 2017-08-02 MED ORDER — ONDANSETRON HCL 4 MG/2ML IJ SOLN: 4.0000 mg | Freq: Four times a day (QID) | INTRAMUSCULAR | Status: DC | PRN

## 2017-08-02 MED ORDER — SODIUM CHLORIDE 0.9 % IV SOLN
INTRAVENOUS | Status: DC
  Administered 2017-08-02: 14:00:00 via INTRAVENOUS

## 2017-08-02 MED ORDER — ACETAMINOPHEN 10 MG/ML IV SOLN
1000.0000 mg | Freq: Four times a day (QID) | INTRAVENOUS | Status: AC
  Administered 2017-08-02 – 2017-08-03 (×4): 1000 mg via INTRAVENOUS
  Filled 2017-08-02 (×4): qty 100

## 2017-08-02 MED ORDER — BUPIVACAINE HCL (PF) 0.5 % IJ SOLN
INTRAMUSCULAR | Status: DC | PRN
  Administered 2017-08-02: 2 mL

## 2017-08-02 MED ORDER — TETRACAINE HCL 1 % IJ SOLN
INTRAMUSCULAR | Status: DC | PRN
  Administered 2017-08-02: 8 mg via INTRASPINAL

## 2017-08-02 MED ORDER — MAGNESIUM HYDROXIDE 400 MG/5ML PO SUSP
30.0000 mL | Freq: Every day | ORAL | Status: DC
  Administered 2017-08-03 – 2017-08-04 (×2): 30 mL via ORAL
  Filled 2017-08-02 (×2): qty 30

## 2017-08-02 MED ORDER — GABAPENTIN 300 MG PO CAPS
ORAL_CAPSULE | ORAL | Status: AC
  Administered 2017-08-02: 300 mg via ORAL
  Filled 2017-08-02: qty 1

## 2017-08-02 MED ORDER — ONDANSETRON HCL 4 MG/2ML IJ SOLN: 4.0000 mg | Freq: Once | INTRAMUSCULAR | Status: DC | PRN

## 2017-08-02 MED ORDER — AMLODIPINE BESYLATE 5 MG PO TABS
5.0000 mg | ORAL_TABLET | Freq: Every day | ORAL | Status: DC
  Administered 2017-08-03 – 2017-08-04 (×2): 5 mg via ORAL
  Filled 2017-08-02 (×2): qty 1

## 2017-08-02 MED ORDER — AMLODIPINE BESYLATE 5 MG PO TABS
5.0000 mg | ORAL_TABLET | Freq: Once | ORAL | Status: AC
  Administered 2017-08-02: 5 mg via ORAL
  Filled 2017-08-02: qty 1

## 2017-08-02 MED ORDER — OXYCODONE HCL 5 MG PO TABS
5.0000 mg | ORAL_TABLET | ORAL | Status: DC | PRN
  Administered 2017-08-02: 5 mg via ORAL
  Filled 2017-08-02: qty 1

## 2017-08-02 MED ORDER — CELECOXIB 200 MG PO CAPS
200.0000 mg | ORAL_CAPSULE | Freq: Two times a day (BID) | ORAL | Status: DC
  Administered 2017-08-02 – 2017-08-04 (×4): 200 mg via ORAL
  Filled 2017-08-02 (×4): qty 1

## 2017-08-02 MED ORDER — COLCHICINE 0.6 MG PO TABS
0.6000 mg | ORAL_TABLET | Freq: Every day | ORAL | Status: DC
  Filled 2017-08-02: qty 1

## 2017-08-02 MED ORDER — SUCCINYLCHOLINE CHLORIDE 20 MG/ML IJ SOLN
INTRAMUSCULAR | Status: AC
  Filled 2017-08-02: qty 1

## 2017-08-02 MED ORDER — ENOXAPARIN SODIUM 30 MG/0.3ML ~~LOC~~ SOLN
30.0000 mg | Freq: Two times a day (BID) | SUBCUTANEOUS | Status: DC
Start: 2017-08-03 — End: 2017-08-04
  Administered 2017-08-03 – 2017-08-04 (×3): 30 mg via SUBCUTANEOUS
  Filled 2017-08-02 (×3): qty 0.3

## 2017-08-02 MED ORDER — METOCLOPRAMIDE HCL 10 MG PO TABS: 5.0000 mg | ORAL_TABLET | Freq: Three times a day (TID) | ORAL | Status: DC | PRN

## 2017-08-02 MED ORDER — CHLORHEXIDINE GLUCONATE 4 % EX LIQD: 60.0000 mL | Freq: Once | CUTANEOUS | Status: DC

## 2017-08-02 MED ORDER — LACTATED RINGERS IV SOLN
INTRAVENOUS | Status: DC
  Administered 2017-08-02: 100 mL/h via INTRAVENOUS

## 2017-08-02 MED ORDER — HYDROCHLOROTHIAZIDE 25 MG PO TABS
25.0000 mg | ORAL_TABLET | Freq: Every day | ORAL | Status: DC
  Administered 2017-08-02 – 2017-08-04 (×3): 25 mg via ORAL
  Filled 2017-08-02 (×3): qty 1

## 2017-08-02 MED ORDER — OXYCODONE HCL 5 MG PO TABS
10.0000 mg | ORAL_TABLET | ORAL | Status: DC | PRN
  Administered 2017-08-03 – 2017-08-04 (×5): 10 mg via ORAL
  Filled 2017-08-02 (×5): qty 2

## 2017-08-02 MED ORDER — ONDANSETRON HCL 4 MG PO TABS: 4.0000 mg | ORAL_TABLET | Freq: Four times a day (QID) | ORAL | Status: DC | PRN

## 2017-08-02 MED ORDER — CELECOXIB 200 MG PO CAPS
400.0000 mg | ORAL_CAPSULE | Freq: Once | ORAL | Status: AC
  Administered 2017-08-02: 400 mg via ORAL

## 2017-08-02 MED ORDER — FERROUS SULFATE 325 (65 FE) MG PO TABS
325.0000 mg | ORAL_TABLET | Freq: Two times a day (BID) | ORAL | Status: DC
  Administered 2017-08-03 – 2017-08-04 (×3): 325 mg via ORAL
  Filled 2017-08-02 (×4): qty 1

## 2017-08-02 MED ORDER — TETRACAINE HCL 1 % IJ SOLN
INTRAMUSCULAR | Status: AC
  Filled 2017-08-02: qty 2

## 2017-08-02 MED ORDER — BISACODYL 10 MG RE SUPP: 10.0000 mg | Freq: Every day | RECTAL | Status: DC | PRN

## 2017-08-02 MED ORDER — DEXMEDETOMIDINE HCL 200 MCG/2ML IV SOLN
INTRAVENOUS | Status: DC | PRN
  Administered 2017-08-02: 8 ug via INTRAVENOUS
  Administered 2017-08-02: 12 ug via INTRAVENOUS
  Administered 2017-08-02: 8 ug via INTRAVENOUS
  Administered 2017-08-02: 12 ug via INTRAVENOUS

## 2017-08-02 MED ORDER — ACETAMINOPHEN 10 MG/ML IV SOLN
INTRAVENOUS | Status: AC
  Filled 2017-08-02: qty 100

## 2017-08-02 MED ORDER — TRAMADOL HCL 50 MG PO TABS
50.0000 mg | ORAL_TABLET | ORAL | Status: DC | PRN
  Administered 2017-08-02: 100 mg via ORAL
  Filled 2017-08-02: qty 2
  Filled 2017-08-02: qty 1
  Filled 2017-08-02: qty 2

## 2017-08-02 MED ORDER — NEOMYCIN-POLYMYXIN B GU 40-200000 IR SOLN
Status: AC
  Filled 2017-08-02: qty 20

## 2017-08-02 MED ORDER — TRANEXAMIC ACID 1000 MG/10ML IV SOLN
1000.0000 mg | INTRAVENOUS | Status: AC
  Administered 2017-08-02: 1000 mg via INTRAVENOUS
  Filled 2017-08-02: qty 10

## 2017-08-02 MED ORDER — FLEET ENEMA 7-19 GM/118ML RE ENEM: 1.0000 | ENEMA | Freq: Once | RECTAL | Status: DC | PRN

## 2017-08-02 MED ORDER — FAMOTIDINE 20 MG PO TABS: 20.0000 mg | ORAL_TABLET | Freq: Once | ORAL | Status: DC

## 2017-08-02 MED ORDER — DEXAMETHASONE SODIUM PHOSPHATE 10 MG/ML IJ SOLN
8.0000 mg | Freq: Once | INTRAMUSCULAR | Status: AC
  Administered 2017-08-02: 8 mg via INTRAVENOUS

## 2017-08-02 MED ORDER — PHENOL 1.4 % MT LIQD
1.0000 | OROMUCOSAL | Status: DC | PRN
  Filled 2017-08-02: qty 177

## 2017-08-02 MED ORDER — FENTANYL CITRATE (PF) 100 MCG/2ML IJ SOLN
INTRAMUSCULAR | Status: AC
  Filled 2017-08-02: qty 2

## 2017-08-02 MED ORDER — CEFAZOLIN SODIUM-DEXTROSE 2-4 GM/100ML-% IV SOLN
2.0000 g | INTRAVENOUS | Status: AC
  Administered 2017-08-02: 2 g via INTRAVENOUS

## 2017-08-02 MED ORDER — TRANEXAMIC ACID 1000 MG/10ML IV SOLN
1000.0000 mg | Freq: Once | INTRAVENOUS | Status: AC
  Administered 2017-08-02: 1000 mg via INTRAVENOUS
  Filled 2017-08-02: qty 10

## 2017-08-02 MED ORDER — SODIUM CHLORIDE 0.9 % IV SOLN
INTRAVENOUS | Status: DC | PRN
  Administered 2017-08-02: 20 ug/min via INTRAVENOUS

## 2017-08-02 MED ORDER — PROPOFOL 500 MG/50ML IV EMUL
INTRAVENOUS | Status: DC | PRN
  Administered 2017-08-02: 125 ug/kg/min via INTRAVENOUS

## 2017-08-02 MED ORDER — ATENOLOL 25 MG PO TABS
100.0000 mg | ORAL_TABLET | Freq: Every day | ORAL | Status: DC
  Administered 2017-08-03 – 2017-08-04 (×2): 100 mg via ORAL
  Filled 2017-08-02 (×2): qty 4

## 2017-08-02 MED ORDER — BUPIVACAINE HCL (PF) 0.5 % IJ SOLN
INTRAMUSCULAR | Status: AC
  Filled 2017-08-02: qty 10

## 2017-08-02 MED ORDER — PHENYLEPHRINE HCL 10 MG/ML IJ SOLN
INTRAMUSCULAR | Status: AC
  Filled 2017-08-02: qty 1

## 2017-08-02 MED ORDER — GABAPENTIN 300 MG PO CAPS
300.0000 mg | ORAL_CAPSULE | Freq: Once | ORAL | Status: AC
  Administered 2017-08-02: 300 mg via ORAL

## 2017-08-02 MED ORDER — DEXAMETHASONE SODIUM PHOSPHATE 10 MG/ML IJ SOLN
INTRAMUSCULAR | Status: AC
  Administered 2017-08-02: 8 mg via INTRAVENOUS
  Filled 2017-08-02: qty 1

## 2017-08-02 MED ORDER — DIPHENHYDRAMINE HCL 12.5 MG/5ML PO ELIX
12.5000 mg | ORAL_SOLUTION | ORAL | Status: DC | PRN
  Administered 2017-08-02: 12.5 mg via ORAL
  Administered 2017-08-03: 25 mg via ORAL
  Filled 2017-08-02: qty 5
  Filled 2017-08-02: qty 10

## 2017-08-02 MED ORDER — METOCLOPRAMIDE HCL 10 MG PO TABS
10.0000 mg | ORAL_TABLET | Freq: Three times a day (TID) | ORAL | Status: DC
  Administered 2017-08-03 – 2017-08-04 (×6): 10 mg via ORAL
  Filled 2017-08-02 (×7): qty 1

## 2017-08-02 MED ORDER — CEFAZOLIN SODIUM-DEXTROSE 2-4 GM/100ML-% IV SOLN
INTRAVENOUS | Status: AC
  Filled 2017-08-02: qty 100

## 2017-08-02 SURGICAL SUPPLY — 53 items
BLADE DRUM FLTD (BLADE) ×2 IMPLANT
BLADE SAW 1 (BLADE) ×2 IMPLANT
CANISTER SUCT 1200ML W/VALVE (MISCELLANEOUS) ×2 IMPLANT
CANISTER SUCT 3000ML PPV (MISCELLANEOUS) ×4 IMPLANT
CAPT HIP TOTAL 2 ×2 IMPLANT
CARTRIDGE OIL MAESTRO DRILL (MISCELLANEOUS) ×1 IMPLANT
DIFFUSER DRILL AIR PNEUMATIC (MISCELLANEOUS) ×2 IMPLANT
DRAPE INCISE IOBAN 66X60 STRL (DRAPES) ×2 IMPLANT
DRAPE SHEET LG 3/4 BI-LAMINATE (DRAPES) ×2 IMPLANT
DRSG DERMACEA 8X12 NADH (GAUZE/BANDAGES/DRESSINGS) ×2 IMPLANT
DRSG OPSITE POSTOP 4X12 (GAUZE/BANDAGES/DRESSINGS) ×2 IMPLANT
DRSG OPSITE POSTOP 4X14 (GAUZE/BANDAGES/DRESSINGS) IMPLANT
DRSG TEGADERM 4X4.75 (GAUZE/BANDAGES/DRESSINGS) ×2 IMPLANT
DURAPREP 26ML APPLICATOR (WOUND CARE) ×2 IMPLANT
ELECT BLADE 6.5 EXT (BLADE) ×2 IMPLANT
ELECT CAUTERY BLADE 6.4 (BLADE) ×2 IMPLANT
GLOVE BIOGEL M STRL SZ7.5 (GLOVE) ×4 IMPLANT
GLOVE BIOGEL PI IND STRL 9 (GLOVE) ×1 IMPLANT
GLOVE BIOGEL PI INDICATOR 9 (GLOVE) ×1
GLOVE INDICATOR 8.0 STRL GRN (GLOVE) ×2 IMPLANT
GLOVE SURG SYN 9.0  PF PI (GLOVE) ×1
GLOVE SURG SYN 9.0 PF PI (GLOVE) ×1 IMPLANT
GOWN STRL REUS W/ TWL LRG LVL3 (GOWN DISPOSABLE) ×2 IMPLANT
GOWN STRL REUS W/TWL 2XL LVL3 (GOWN DISPOSABLE) ×2 IMPLANT
GOWN STRL REUS W/TWL LRG LVL3 (GOWN DISPOSABLE) ×2
HEMOVAC 400CC 10FR (MISCELLANEOUS) ×2 IMPLANT
HOLDER FOLEY CATH W/STRAP (MISCELLANEOUS) ×2 IMPLANT
HOOD PEEL AWAY FLYTE STAYCOOL (MISCELLANEOUS) ×4 IMPLANT
KIT TURNOVER KIT A (KITS) ×2 IMPLANT
NDL SAFETY ECLIPSE 18X1.5 (NEEDLE) ×1 IMPLANT
NEEDLE HYPO 18GX1.5 SHARP (NEEDLE) ×1
NS IRRIG 500ML POUR BTL (IV SOLUTION) ×2 IMPLANT
OIL CARTRIDGE MAESTRO DRILL (MISCELLANEOUS) ×2
PACK HIP PROSTHESIS (MISCELLANEOUS) ×2 IMPLANT
PIN STEIN THRED 5/32 (Pin) ×2 IMPLANT
PULSAVAC PLUS IRRIG FAN TIP (DISPOSABLE) ×2
SOL .9 NS 3000ML IRR  AL (IV SOLUTION) ×1
SOL .9 NS 3000ML IRR UROMATIC (IV SOLUTION) ×1 IMPLANT
SOL PREP PVP 2OZ (MISCELLANEOUS) ×2
SOLUTION PREP PVP 2OZ (MISCELLANEOUS) ×1 IMPLANT
SPONGE DRAIN TRACH 4X4 STRL 2S (GAUZE/BANDAGES/DRESSINGS) ×2 IMPLANT
STAPLER SKIN PROX 35W (STAPLE) ×2 IMPLANT
SUT ETHIBOND #5 BRAIDED 30INL (SUTURE) ×2 IMPLANT
SUT VIC AB 0 CT1 36 (SUTURE) ×2 IMPLANT
SUT VIC AB 1 CT1 36 (SUTURE) ×4 IMPLANT
SUT VIC AB 2-0 CT1 27 (SUTURE) ×1
SUT VIC AB 2-0 CT1 TAPERPNT 27 (SUTURE) ×1 IMPLANT
SYR 20CC LL (SYRINGE) ×2 IMPLANT
TAPE ADH 3 LX (MISCELLANEOUS) ×2 IMPLANT
TAPE TRANSPORE STRL 2 31045 (GAUZE/BANDAGES/DRESSINGS) ×2 IMPLANT
TIP FAN IRRIG PULSAVAC PLUS (DISPOSABLE) ×1 IMPLANT
TOWEL OR 17X26 4PK STRL BLUE (TOWEL DISPOSABLE) ×2 IMPLANT
TRAY FOLEY MTR SLVR 16FR STAT (SET/KITS/TRAYS/PACK) ×2 IMPLANT

## 2017-08-02 NOTE — OR Nursing (Signed)
Received instructions with return demonstration for use of the incentive spirometer. Patient understands the device and how to use after surgery.

## 2017-08-02 NOTE — Progress Notes (Signed)
PT Cancellation Note  Patient Details Name: Evan Sullivan MRN: [id number] DOB: 1970/08/31   Cancelled Treatment:    Reason Eval/Treat Not Completed: Patient not medically ready.  PT consult received.  Chart reviewed.  Pt does not have full LE sensation and strength back yet (minimal movement at R knee and no active movement rest of R LE below knee; unable to perform any active movement below L knee).  Discussed with pt and nurse regarding pt dangling edge of bed later today once pt has full LE sensation and strength back.  Will re-attempt PT evaluation tomorrow morning.  Leitha Bleak, PT 08/02/17, 3:18 PM [phone]

## 2017-08-02 NOTE — Anesthesia Procedure Notes (Signed)
Spinal  Patient location during procedure: OR Start time: 08/02/2017 7:15 AM End time: 08/02/2017 7:20 AM Staffing Anesthesiologist: Molli Barrows, MD Resident/CRNA: Rolla Plate, CRNA Other anesthesia staff: Salomon Fick, RN Performed: other anesthesia staff  Preanesthetic Checklist Completed: patient identified, site marked, surgical consent, pre-op evaluation, timeout performed, IV checked, risks and benefits discussed and monitors and equipment checked Spinal Block Patient position: sitting Prep: Betadine and ChloraPrep Patient monitoring: heart rate, continuous pulse ox and blood pressure Approach: midline Location: L4-5 Injection technique: single-shot Needle Needle type: Pencan  Needle gauge: 24 G Needle length: 10 cm Additional Notes Negative paresthesia. Negative blood return. Positive free-flowing CSF. Expiration date of kit checked and confirmed. Patient tolerated procedure well, without complications.

## 2017-08-02 NOTE — Anesthesia Post-op Follow-up Note (Signed)
Anesthesia QCDR form completed.        

## 2017-08-02 NOTE — Anesthesia Preprocedure Evaluation (Signed)
Anesthesia Evaluation  Patient identified by MRN, date of birth, ID band Patient awake    Reviewed: Allergy & Precautions, H&P , NPO status , Patient's Chart, lab work & pertinent test results, reviewed documented beta blocker date and time   Airway Mallampati: II   Neck ROM: full    Dental  (+) Poor Dentition   Pulmonary neg pulmonary ROS,    Pulmonary exam normal        Cardiovascular Exercise Tolerance: Good hypertension, On Medications negative cardio ROS Normal cardiovascular exam Rhythm:regular Rate:Normal  Hx of arrythmia and clearance per [facility] cards. JA   Neuro/Psych negative neurological ROS  negative psych ROS   GI/Hepatic negative GI ROS, Neg liver ROS, PUD, GERD  Medicated,  Endo/Other  negative endocrine ROS  Renal/GU negative Renal ROS  negative genitourinary   Musculoskeletal   Abdominal   Peds  Hematology negative hematology ROS (+) anemia ,   Anesthesia Other Findings Past Medical History: No date: Arthritis 2019: Avascular necrosis of femoral head, right ([facility]) No date: GERD (gastroesophageal reflux disease) No date: Gout No date: Hypertension No date: Peptic ulcer disease Past Surgical History: No date: COLONOSCOPY 2004,2000: KNEE ARTHROSCOPY, MEDIAL PATELLO FEMORAL LIGAMENT REPAIR;  Bilateral BMI    Body Mass Index:  28.48 kg/m     Reproductive/Obstetrics negative OB ROS                             Anesthesia Physical Anesthesia Plan  ASA: III  Anesthesia Plan: General and Spinal   Post-op Pain Management:    Induction:   PONV Risk Score and Plan: 3  Airway Management Planned:   Additional Equipment:   Intra-op Plan:   Post-operative Plan:   Informed Consent: I have reviewed the patients History and Physical, chart, labs and discussed the procedure including the risks, benefits and alternatives for the proposed anesthesia with  the patient or authorized representative who has indicated his/her understanding and acceptance.   Dental Advisory Given  Plan Discussed with: CRNA  Anesthesia Plan Comments:         Anesthesia Quick Evaluation

## 2017-08-02 NOTE — NC FL2 (Signed)
[place] LEVEL OF CARE SCREENING TOOL     IDENTIFICATION  Patient Name: Evan Sullivan Birthdate: 10/10/1970 Sex: male Admission Date (Current Location): 08/02/2017  [place] and [place] Number:  Engineering geologist and Address:  [facility], [address], [place], [place] [address]      Provider Number: [id number]  Attending Physician Name and Address:  Dereck Leep, MD  Relative Name and Phone Number:       Current Level of Care: Hospital Recommended Level of Care: [place] Prior Approval Number:    Date Approved/Denied:   PASRR Number: [id number])  Discharge Plan: SNF    Current Diagnoses: Patient Active Problem List   Diagnosis Date Noted  . 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 Avascular necrosis of bone of hip, right ([place]) 03/15/2017  . Essential hypertension 03/15/2017  . History of peptic ulcer disease 03/15/2017  . Anemia 11/01/2015  . SOB (shortness of breath) 11/01/2015  . Syncope 11/01/2015    Orientation RESPIRATION BLADDER Height & Weight     Self, Time, Situation, Place  Normal Continent Weight: 210 lb (95.3 kg) Height:  6' (182.9 cm)  BEHAVIORAL SYMPTOMS/MOOD NEUROLOGICAL BOWEL NUTRITION STATUS      Continent Diet(Diet: Heart Healthy )  AMBULATORY STATUS COMMUNICATION OF NEEDS Skin   Extensive Assist Verbally Surgical wounds(Incision: Right Hip. )                       Personal Care Assistance Level of Assistance  Bathing, Feeding, Dressing Bathing Assistance: Limited assistance Feeding assistance: Independent Dressing Assistance: Limited assistance     Functional Limitations Info  Sight, Hearing, Speech Sight Info: Adequate Hearing Info: Adequate Speech Info: Adequate    SPECIAL CARE FACTORS FREQUENCY  PT (By licensed PT), OT (By licensed OT)     PT Frequency: (5) OT Frequency: (5)            Contractures      Additional Factors Info  Code  Status, Allergies Code Status Info: (Full Code. ) Allergies Info: (No Known Allergies. )           Current Medications (08/02/2017):  This is the current hospital active medication list Current Facility-Administered Medications  Medication Dose Route Frequency Provider Last Rate Last Dose  . 0.9 %  sodium chloride infusion   Intravenous Continuous Hooten, Laurice Record, MD      . acetaminophen (OFIRMEV) IV 1,000 mg  1,000 mg Intravenous Q6H Hooten, Laurice Record, MD      . Derrill Memo ON 08/03/2017] acetaminophen (TYLENOL) tablet 325-650 mg  325-650 mg Oral Q6H PRN Hooten, Laurice Record, MD      . alum & mag hydroxide-simeth (MAALOX/MYLANTA) 200-200-20 MG/5ML suspension 30 mL  30 mL Oral Q4H PRN Hooten, Laurice Record, MD      . Derrill Memo ON 08/03/2017] amLODipine (NORVASC) tablet 5 mg  5 mg Oral Daily Hooten, Laurice Record, MD      . Derrill Memo ON 08/03/2017] atenolol (TENORMIN) tablet 100 mg  100 mg Oral Daily Hooten, Laurice Record, MD      . bisacodyl (DULCOLAX) suppository 10 mg  10 mg Rectal Daily PRN Hooten, Laurice Record, MD      . ceFAZolin (ANCEF) IVPB 2g/100 mL premix  2 g Intravenous Q6H Hooten, Laurice Record, MD      . celecoxib (CELEBREX) capsule 200 mg  200 mg Oral BID Hooten, Laurice Record, MD      .  colchicine tablet 0.6 mg  0.6 mg Oral Daily Hooten, Laurice Record, MD      . diphenhydrAMINE (BENADRYL) 12.5 MG/5ML elixir 12.5-25 mg  12.5-25 mg Oral Q4H PRN Hooten, Laurice Record, MD      . Derrill Memo ON 08/03/2017] enoxaparin (LOVENOX) injection 30 mg  30 mg Subcutaneous Q12H Hooten, Laurice Record, MD      . famotidine (PEPCID) tablet 20 mg  20 mg Oral Once Molli Barrows, MD      . ferrous sulfate tablet 325 mg  325 mg Oral BID WC Hooten, Laurice Record, MD      . gabapentin (NEURONTIN) capsule 300 mg  300 mg Oral QHS Hooten, Laurice Record, MD      . hydrochlorothiazide (HYDRODIURIL) tablet 25 mg  25 mg Oral Daily Hooten, Laurice Record, MD      . hydrocortisone cream 1 % 1 application  1 application Topical Daily PRN Hooten, Laurice Record, MD      . HYDROmorphone (DILAUDID) injection 0.5-1 mg   0.5-1 mg Intravenous Q4H PRN Hooten, Laurice Record, MD      . magnesium hydroxide (MILK OF MAGNESIA) suspension 30 mL  30 mL Oral Daily Hooten, Laurice Record, MD      . menthol-cetylpyridinium (CEPACOL) lozenge 3 mg  1 lozenge Oral PRN Hooten, Laurice Record, MD       Or  . phenol (CHLORASEPTIC) mouth spray 1 spray  1 spray Mouth/Throat PRN Hooten, Laurice Record, MD      . metoCLOPramide (REGLAN) tablet 5-10 mg  5-10 mg Oral Q8H PRN Hooten, Laurice Record, MD       Or  . metoCLOPramide (REGLAN) injection 5-10 mg  5-10 mg Intravenous Q8H PRN Hooten, Laurice Record, MD      . metoCLOPramide (REGLAN) tablet 10 mg  10 mg Oral TID AC & HS Hooten, Laurice Record, MD      . ondansetron (ZOFRAN) tablet 4 mg  4 mg Oral Q6H PRN Hooten, Laurice Record, MD       Or  . ondansetron (ZOFRAN) injection 4 mg  4 mg Intravenous Q6H PRN Hooten, Laurice Record, MD      . oxyCODONE (Oxy IR/ROXICODONE) immediate release tablet 10 mg  10 mg Oral Q4H PRN Hooten, Laurice Record, MD      . oxyCODONE (Oxy IR/ROXICODONE) immediate release tablet 5 mg  5 mg Oral Q4H PRN Hooten, Laurice Record, MD      . pantoprazole (PROTONIX) EC tablet 40 mg  40 mg Oral BID Hooten, Laurice Record, MD      . senna-docusate (Senokot-S) tablet 1 tablet  1 tablet Oral BID Hooten, Laurice Record, MD      . sodium phosphate (FLEET) 7-19 GM/118ML enema 1 enema  1 enema Rectal Once PRN Hooten, Laurice Record, MD      . traMADol Veatrice Bourbon) tablet 50-100 mg  50-100 mg Oral Q4H PRN Hooten, Laurice Record, MD         Discharge Medications: Please see discharge summary for a list of discharge medications.  Relevant Imaging Results:  Relevant Lab Results:   Additional Information (SSN: [id number])  Gazelle Towe, Veronia Beets, LCSW

## 2017-08-02 NOTE — Progress Notes (Signed)
Paged MD (Hooten) regarding elevated BP. Verbal order to give home med now, f/u with Hooten.

## 2017-08-02 NOTE — Op Note (Signed)
OPERATIVE NOTE  DATE OF SURGERY:  08/02/2017  PATIENT NAME:  Evan Sullivan   DOB: [birth year]  MRN: [id number]  PRE-OPERATIVE DIAGNOSIS: Degenerative arthrosis of the right hip, primary  POST-OPERATIVE DIAGNOSIS:  Same  PROCEDURE:  Right total hip arthroplasty  SURGEON:  Marciano Sequin. M.D.  ASSISTANT:  Vance Peper, PA (present and scrubbed throughout the case, critical for assistance with exposure, retraction, instrumentation, and closure)  ANESTHESIA: spinal  ESTIMATED BLOOD LOSS: 100 mL  FLUIDS REPLACED: 1000 mL of crystalloid  DRAINS: 2 medium drains to a Hemovac reservoir  IMPLANTS UTILIZED: DePuy 15 mm small stature AML femoral stem, 56 mm OD Pinnacle 100 acetabular component, +4 mm neutral Pinnacle Marathon polyethylene insert, and a 36 mm M-SPEC +1.5 mm hip ball  INDICATIONS FOR SURGERY: Evan Sullivan is a 47 y.o. year old male with a long history of progressive hip and groin  pain. X-rays demonstrated severe degenerative changes. The patient had not seen any significant improvement despite conservative nonsurgical intervention. After discussion of the risks and benefits of surgical intervention, the patient expressed understanding of the risks benefits and agree with plans for total hip arthroplasty.   The risks, benefits, and alternatives were discussed at length including but not limited to the risks of infection, bleeding, nerve injury, stiffness, blood clots, the need for revision surgery, limb length inequality, dislocation, cardiopulmonary complications, among others, and they were willing to proceed.  PROCEDURE IN DETAIL: The patient was brought into the operating room and, after adequate spinal anesthesia was achieved, the patient was placed in a left lateral decubitus position. Axillary roll was placed and all bony prominences were well-padded. The patient's right hip was cleaned and prepped with alcohol and DuraPrep and draped in the usual sterile fashion. A "timeout"  was performed as per usual protocol. A lateral curvilinear incision was made gently curving towards the posterior superior iliac spine. The IT band was incised in line with the skin incision and the fibers of the gluteus maximus were split in line. The piriformis tendon was identified, skeletonized, and incised at its insertion to the proximal femur and reflected posteriorly. A T type posterior capsulotomy was performed. Prior to dislocation of the femoral head, a threaded Steinmann pin was inserted through a separate stab incision into the pelvis superior to the acetabulum and bent in the form of a stylus so as to assess limb length and hip offset throughout the procedure. The femoral head was then dislocated posteriorly. Inspection of the femoral head demonstrated severe degenerative changes with full-thickness loss of articular cartilage as well as gross irregularity.  Prominent synovial tissue was also noted. The femoral neck cut was performed using an oscillating saw. The anterior capsule was elevated off of the femoral neck using a periosteal elevator. Attention was then directed to the acetabulum. The remnant of the labrum was excised using electrocautery. Inspection of the acetabulum also demonstrated significant degenerative changes. The acetabulum was reamed in sequential fashion up to a 55 mm diameter. Good punctate bleeding bone was encountered.  There was a contained cystic lesion in the dome of the acetabulum.  The cyst was curettaged free of soft tissue.  Bone graft was retrieved from the femoral head and impacted in the cystic lesion.  The 55 mm reamer was then placed in the acetabulum and reamed in reverse to impact the graft material.  A 56 mm Pinnacle 100 acetabular component was positioned and impacted into place. Good scratch fit was appreciated. A +4 mm neutral  polyethylene trial was inserted.  Attention was then directed to the proximal femur. A hole for reaming of the proximal femoral canal  was created using a high-speed burr. The femoral canal was reamed in sequential fashion up to a 14.5 mm diameter. This allowed for approximately 7 cm of scratch fit.  It was thus elected to ream up to a 15 mm diameter to allow for a line to line fit.  Serial broaches were inserted up to a 15 mm small stature femoral broach. Calcar region was planed and a trial reduction was performed using a 36 mm hip ball with a +1.5 mm neck length. Good equalization of limb lengths and hip offset was appreciated and excellent stability was noted both anteriorly and posteriorly. Trial components were removed. The acetabular shell was irrigated with copious amounts of normal saline with antibiotic solution and suctioned dry. A +4 mm neutral Pinnacle Marathon polyethylene insert was positioned and impacted into place. Next, a 15 mm small stature AML femoral stem was positioned and impacted into place. Excellent scratch fit was appreciated. A trial reduction was again performed with a 36 mm hip ball with a +1.5 mm neck length. Again, good equalization of limb lengths was appreciated and excellent stability appreciated both anteriorly and posteriorly. The hip was then dislocated and the trial hip ball was removed. The Morse taper was cleaned and dried. A 36 mm MM-SPEC hip ball with a +1.5 mm neck length was placed on the trunnion and impacted into place. The hip was then reduced and placed through range of motion. Excellent stability was appreciated both anteriorly and posteriorly.  The wound was irrigated with copious amounts of normal saline with antibiotic solution and suctioned dry. Good hemostasis was appreciated. The posterior capsulotomy was repaired using #5 Ethibond. Piriformis tendon was reapproximated to the undersurface of the gluteus medius tendon using #5 Ethibond. Two medium drains were placed in the wound bed and brought out through separate stab incisions to be attached to a Hemovac reservoir. The IT band was  reapproximated using interrupted sutures of #1 Vicryl. Subcutaneous tissue was approximated using first #0 Vicryl followed by #2-0 Vicryl. The skin was closed with skin staples.  The patient tolerated the procedure well and was transported to the recovery room in stable condition.   Marciano Sequin., M.D.

## 2017-08-02 NOTE — Transfer of Care (Signed)
Immediate Anesthesia Transfer of Care Note  Patient: Evan Sullivan  Procedure(s) Performed: TOTAL HIP ARTHROPLASTY (Right Hip)  Patient Location: PACU  Anesthesia Type:Spinal  Level of Consciousness: awake  Airway & Oxygen Therapy: Patient Spontanous Breathing  Post-op Assessment: Report given to RN and Post -op Vital signs reviewed and stable  Post vital signs: Reviewed  Last Vitals:  Vitals Value Taken Time  BP 124/79 08/02/2017 10:41 AM  Temp    Pulse 59 08/02/2017 10:42 AM  Resp 19 08/02/2017 10:42 AM  SpO2 97 % 08/02/2017 10:42 AM  Vitals shown include unvalidated device data.  Last Pain:  Vitals:   08/02/17 0622  TempSrc: Oral  PainSc: 0-No pain      Patients Stated Pain Goal: 0 (91/66/06 0045)  Complications: No apparent anesthesia complications

## 2017-08-03 ENCOUNTER — Encounter: Payer: Self-pay | Admitting: Orthopedic Surgery

## 2017-08-03 MED ORDER — TRAMADOL HCL 50 MG PO TABS: 50.0000 mg | ORAL_TABLET | ORAL | 0 refills | Status: DC | PRN

## 2017-08-03 MED ORDER — HYDRALAZINE HCL 25 MG PO TABS
25.0000 mg | ORAL_TABLET | Freq: Three times a day (TID) | ORAL | Status: DC
  Administered 2017-08-03 – 2017-08-04 (×3): 25 mg via ORAL
  Filled 2017-08-03 (×3): qty 1

## 2017-08-03 MED ORDER — OXYCODONE HCL 5 MG PO TABS: 5.0000 mg | ORAL_TABLET | ORAL | 0 refills | Status: DC | PRN

## 2017-08-03 MED ORDER — ENOXAPARIN SODIUM 40 MG/0.4ML ~~LOC~~ SOLN: 40.0000 mg | SUBCUTANEOUS | 0 refills | Status: DC

## 2017-08-03 NOTE — Discharge Summary (Addendum)
Physician Discharge Summary  Patient ID: Evan Sullivan MRN: [id number] DOB/AGE: [birth year] 47 y.o.  Admit date: 08/02/2017 Discharge date: 08/04/2017  Admission Diagnoses:  [place] SECONDARY OSTEOARTHRITIS OF RIGHT HIP   Discharge Diagnoses: Patient Active Problem List   Diagnosis Date Noted  . 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 Avascular necrosis of bone of hip, right ([place]) 03/15/2017  . Essential hypertension 03/15/2017  . History of peptic ulcer disease 03/15/2017  . Anemia 11/01/2015  . SOB (shortness of breath) 11/01/2015  . Syncope 11/01/2015    Past Medical History:  Diagnosis Date  . Arthritis   . Avascular necrosis of femoral head, right ([place]) 2019  . GERD (gastroesophageal reflux disease)   . Gout   . Hypertension   . Peptic ulcer disease      Transfusion: No transfusions during this admission   Consultants (if any):   Discharged Condition: Improved  Hospital Course: Evan Sullivan is an 47 y.o. male who was admitted 08/02/2017 with a diagnosis of degenerative arthrosis right hip and went to the operating room on 08/02/2017 and underwent the above named procedures.    Surgeries:Procedure(s): TOTAL HIP ARTHROPLASTY on 08/02/2017  PRE-OPERATIVE DIAGNOSIS: Degenerative arthrosis of the right hip, primary  POST-OPERATIVE DIAGNOSIS:  Same  PROCEDURE:  Right total hip arthroplasty  SURGEON:  Marciano Sequin. M.D.  ASSISTANT:  Vance Peper, PA (present and scrubbed throughout the case, critical for assistance with exposure, retraction, instrumentation, and closure)  ANESTHESIA: spinal  ESTIMATED BLOOD LOSS: 100 mL  FLUIDS REPLACED: 1000 mL of crystalloid  DRAINS: 2 medium drains to a Hemovac reservoir  IMPLANTS UTILIZED: DePuy 15 mm small stature AML femoral stem, 56 mm OD Pinnacle 100 acetabular component, +4 mm neutral Pinnacle Marathon polyethylene insert, and a 36 mm M-SPEC +1.5 mm hip  ball  INDICATIONS FOR SURGERY: Evan Sullivan is a 47 y.o. year old male with a long history of progressive hip and groin  pain. X-rays demonstrated severe degenerative changes. The patient had not seen any significant improvement despite conservative nonsurgical intervention. After discussion of the risks and benefits of surgical intervention, the patient expressed understanding of the risks benefits and agree with plans for total hip arthroplasty.   The risks, benefits, and alternatives were discussed at length including but not limited to the risks of infection, bleeding, nerve injury, stiffness, blood clots, the need for revision surgery, limb length inequality, dislocation, cardiopulmonary complications, among others, and they were willing to proceed.    Patient tolerated the surgery well. No complications .Patient was taken to PACU where she was stabilized and then transferred to the orthopedic floor.  Patient started on Lovenox 30 mg q 12 hrs. Foot pumps applied bilaterally at 80 mm hgb. Heels elevated off bed with rolled towels. No evidence of DVT. Calves non tender. Negative Homan. Physical therapy started on day #1 for gait training and transfer with OT starting on  day #1 for ADL and assisted devices. Patient has done well with therapy. Ambulated greater than 200 feet upon being discharged.  Patient's IV And Foley were discontinued on day #1 with Hemovac being discontinued on day #2. Dressing was changed on day 2 prior to patient being discharged   He was given perioperative antibiotics:  Anti-infectives (From admission, onward)   Start     Dose/Rate Route Frequency Ordered Stop   08/02/17 1400  ceFAZolin (ANCEF) IVPB 2g/100 mL premix     2 g 200 mL/hr  over 30 Minutes Intravenous Every 6 hours 08/02/17 1255 08/03/17 1359   08/02/17 0633  ceFAZolin (ANCEF) 2-4 GM/100ML-% IVPB    Note to Pharmacy:  Cleatis Polka   : cabinet override      08/02/17 0633 08/02/17 0723   08/02/17 0600   ceFAZolin (ANCEF) IVPB 2g/100 mL premix     2 g 200 mL/hr over 30 Minutes Intravenous On call to O.R. 08/02/17 0131 08/02/17 4010    .  He was fitted with AV 1 compression foot pump devices, instructed on heel pumps, early ambulation, and fitted with TED stockings bilaterally for DVT prophylaxis.  He benefited maximally from the hospital stay and there were no complications.    Recent vital signs:  Vitals:   08/03/17 0021 08/03/17 0343  BP: (!) 150/86 (!) 150/101  Pulse:  70  Resp:    Temp:  97.7 F (36.5 C)  SpO2:  99%    Recent laboratory studies:  Lab Results  Component Value Date   HGB 15.7 07/20/2017   HGB 15.5 04/21/2017   HGB 13.7 09/12/2012   Lab Results  Component Value Date   WBC 6.6 07/20/2017   PLT 284 07/20/2017   Lab Results  Component Value Date   INR 0.86 07/20/2017   Lab Results  Component Value Date   NA 138 07/20/2017   K 3.7 07/20/2017   CL 105 07/20/2017   CO2 24 07/20/2017   BUN 16 07/20/2017   CREATININE 0.94 07/20/2017   GLUCOSE 110 (H) 07/20/2017    Discharge Medications:   Allergies as of 08/03/2017   No Known Allergies     Medication List    TAKE these medications   acetaminophen 500 MG tablet Commonly known as:  TYLENOL Take 1,000 mg by mouth daily as needed for moderate pain.   amLODipine 5 MG tablet Commonly known as:  NORVASC Take 1 tablet (5 mg total) by mouth daily.   atenolol 100 MG tablet Commonly known as:  TENORMIN Take 1 tablet (100 mg total) by mouth daily.   COLCRYS 0.6 MG tablet Generic drug:  colchicine Take 0.6 mg by mouth daily. What changed:  Another medication with the same name was changed. Make sure you understand how and when to take each.   Colchicine 0.6 MG Caps Commonly known as:  MITIGARE Take 1 capsule by mouth daily for 1 dose. What changed:    when to take this  reasons to take this   enoxaparin 40 MG/0.4ML injection Commonly known as:  LOVENOX Inject 0.4 mLs (40 mg total) into  the skin daily for 14 days. Start taking on:  08/05/2017   hydrochlorothiazide 25 MG tablet Commonly known as:  HYDRODIURIL Take 1 tablet (25 mg total) by mouth daily.   hydrocortisone cream 1 % Apply 1 application topically daily as needed (rash).   oxyCODONE 5 MG immediate release tablet Commonly known as:  Oxy IR/ROXICODONE Take 1 tablet (5 mg total) by mouth every 4 (four) hours as needed for moderate pain (pain score 4-6).   pantoprazole 40 MG tablet Commonly known as:  PROTONIX Take 40 mg by mouth daily as needed (acid reflux).   traMADol 50 MG tablet Commonly known as:  ULTRAM Take 1-2 tablets (50-100 mg total) by mouth every 4 (four) hours as needed for moderate pain.            Durable Medical Equipment  (From admission, onward)        Start     Ordered  08/02/17 1256  DME [place] rolling  Once    Question:  Patient needs a [place] to treat with the following condition  Answer:  S/P total hip arthroplasty   08/02/17 1255   08/02/17 1256  DME Bedside commode  Once    Question:  Patient needs a bedside commode to treat with the following condition  Answer:  S/P total hip arthroplasty   08/02/17 1255      Diagnostic Studies: Dg Hip Port Unilat With Pelvis 1v Right  Result Date: 08/02/2017 CLINICAL DATA:  Status post right hip replacement EXAM: DG HIP (WITH OR WITHOUT PELVIS) 1V PORT RIGHT COMPARISON:  None. FINDINGS: Right hip replacement is well visualized. No acute bony or soft tissue abnormality is seen. Surgical drains are noted in place. IMPRESSION: Status post right hip replacement. Electronically Signed   By: Inez Catalina M.D.   On: 08/02/2017 12:22    Disposition:   Discharge Instructions    Increase activity slowly   Complete by:  As directed       Follow-up Information    Hooten, Laurice Record, MD On 09/14/2017.   Specialty:  Orthopedic Surgery Why:  at 9:45am Contact information: [place]  [address] [phone]            Signed: Watt Climes. 08/03/2017, 8:03 AM

## 2017-08-03 NOTE — Progress Notes (Signed)
Physical Therapy Treatment Patient Details Name: Evan Sullivan MRN: [id number] DOB: [birth year] Today's Date: 08/03/2017    History of Present Illness Pt is a 47 y.o. 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  PMH includes HTN, PUD, GERD, anemia, avascular necrosis R femoral head, gout.     PT Comments    Pt reporting minimal R hip soreness/stiffness beginning of session and able to ambulate around nursing loop with RW (pt reporting R hip soreness/stiffness improved with activity and pt reporting 3/10 R hip pain/soreness/stiffness end of session).  Pt able to recall 3/3 posterior hip precautions with increased time.  Will continue to progress pt with strengthening, progressive functional mobility, and trial stairs next session as appropriate.   Follow Up Recommendations  Home health PT     Equipment Recommendations  Rolling [place] with 5" wheels;3in1 (PT)    Recommendations for Other Services OT consult     Precautions / Restrictions Precautions Precautions: Fall;Posterior Hip Precaution Booklet Issued: Yes (comment) Precaution Comments: Pt able to recall 3/3 posterior hip precautions beginning and end of session with increased time. Restrictions Weight Bearing Restrictions: Yes RLE Weight Bearing: Weight bearing as tolerated    Mobility  Bed Mobility Overal bed mobility: Needs Assistance Bed Mobility: Sit to Supine       Sit to supine: Supervision;HOB elevated   General bed mobility comments: increased effort and time to perform on own but no physical assist required  Transfers Overall transfer level: Needs assistance Equipment used: Rolling Sheaffer (2 wheeled) Transfers: Sit to/from Stand Sit to Stand: Supervision         General transfer comment: occasional vc's for posterior hip precautions; strong stand from recliner  Ambulation/Gait Ambulation/Gait assistance: Min guard Gait Distance (Feet): 260 Feet Assistive device: Rolling Husak (2  wheeled) Gait Pattern/deviations: Step-to pattern Gait velocity: decreased   General Gait Details: occasional vc's required for Juran use, gait technique, posterior hip precautions with turns, and to stay within RW with turns   Stairs             Wheelchair Mobility    Modified Rankin (Stroke Patients Only)       Balance Overall balance assessment: Needs assistance Sitting-balance support: No upper extremity supported;Feet supported Sitting balance-Leahy Scale: Normal Sitting balance - Comments: steady sitting reaching outside BOS (within posterior hip precautions)   Standing balance support: No upper extremity supported Standing balance-Leahy Scale: Good Standing balance comment: steady standing reaching within BOS                            Cognition Arousal/Alertness: Awake/alert Behavior During Therapy: WFL for tasks assessed/performed Overall Cognitive Status: Within Functional Limits for tasks assessed                                        Exercises Total Joint Exercises Long Arc Quad: AROM;Strengthening;Both;10 reps;Seated    General Comments General comments (skin integrity, edema, etc.): R hip dressing and hemovac in place beginning and end of session.  Nursing cleared pt for participation in physical therapy.  Pt agreeable to PT session.      Pertinent Vitals/Pain Pain Assessment: 0-10 Pain Score: 3  Pain Location: R hip Pain Descriptors / Indicators: Sore Pain Intervention(s): Limited activity within patient's tolerance;Monitored during session;Repositioned;Ice applied;Other (comment)(Pt was calling nurse end of session for pain medication)  Home Living                      Prior Function            PT Goals (current goals can now be found in the care plan section) Acute Rehab PT Goals Patient Stated Goal: to go home PT Goal Formulation: With patient Time For Goal Achievement: 08/17/17 Potential to  Achieve Goals: Good Progress towards PT goals: Progressing toward goals    Frequency    BID      PT Plan Current plan remains appropriate    Co-evaluation              AM-PAC PT "6 Clicks" Daily Activity  Outcome Measure  Difficulty turning over in bed (including adjusting bedclothes, sheets and blankets)?: A Little Difficulty moving from lying on back to sitting on the side of the bed? : Unable Difficulty sitting down on and standing up from a chair with arms (e.g., wheelchair, bedside commode, etc,.)?: A Little Help needed moving to and from a bed to chair (including a wheelchair)?: A Little Help needed walking in hospital room?: A Little Help needed climbing 3-5 steps with a railing? : A Little 6 Click Score: 16    End of Session Equipment Utilized During Treatment: Gait belt Activity Tolerance: Patient tolerated treatment well Patient left: in bed;with call bell/phone within reach;with bed alarm set;with family/visitor present;with SCD's reapplied;Other (comment)(pillows placed between pt's knees for posterior hip precautions; B heels elevated via towel rolls; ice pack applied) Nurse Communication: Mobility status;Precautions;Weight bearing status PT Visit Diagnosis: Other abnormalities of gait and mobility (R26.89);Muscle weakness (generalized) (M62.81);Difficulty in walking, not elsewhere classified (R26.2);Pain Pain - Right/Left: Right Pain - part of body: Hip     Time: 1402-1430 PT Time Calculation (min) (ACUTE ONLY): 28 min  Charges:  $Gait Training: 8-22 mins $Therapeutic Exercise: 8-22 mins                    G CodesLeitha Bleak, PT 08/03/17, 3:39 PM [phone]

## 2017-08-03 NOTE — Care Management Note (Signed)
Case Management Note  Patient Details  Name: Evan Sullivan MRN: [id number] Date of Birth: 21-Dec-1970  Subjective/Objective:                  RNCM met with patient briefly as PT had just started to evaluate. He plans to return home with a lady friend that works third shift.  He has not DME. He would like to use [facility] for home health. He will need a bedside commode and front-wheeled S[place]an. He uses [facility] [phone].  Action/Plan: Home health list provided to patient for review; referral to [facility]. Rolling Kiester and bedside commode requested from [place] [facility].  [facility]ovenox was 10$ per [facility]- patient updated.  Expected Discharge Date:  08/04/17               Expected Discharge Plan:     In-House Referral:     Discharge planning Services  CM Consult  Post Acute Care Choice:  Durable Medical Equipment, Home Health Choice offered to:  Patient  DME Arranged:  3-N-1, [place] rolling DME Agency:  [place]:  PT [place]:  [facility] (now [facility])  Status of Service:  In process, will continue to follow  If discussed at Long Length of Stay Meetings, dates discussed:    Additional Comments:  Marshell Garfinkel, RN 08/03/2017, 10:07 AM

## 2017-08-03 NOTE — Evaluation (Signed)
Occupational Therapy Evaluation Patient Details Name: Evan Sullivan MRN: [id number] DOB: 01/24/1971 Today's Date: 08/03/2017    History of Present Illness Pt is a 47 y.o. 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  PMH includes HTN, PUD, GERD, anemia, avascular necrosis R femoral head, gout.    Clinical Impression   Pt seen for OT evaluation this date, POD#1 from above surgery. Pt was independent in all ADLs prior to surgery, however having increased difficulty with LB dressing tasks 2:2 R hip pain. Pt is eager to return to PLOF with less pain and improved safety and independence. Pt currently requires minimal assist for LB dressing and bathing while in seated position due to pain and limited AROM of R hip in order to maintain posterior THPs. Pt able to recall 1/3 posterior total hip precautions at start of session and demonstrated difficulty with independently verbalizing how to implement during ADL and mobility. >23 minutes aside from evaluation spent instructing pt in posterior total hip precautions and how to implement, self care skills, falls prevention strategies, home/routines modifications, DME/AE for LB bathing and dressing tasks, functional mobility training within kitchen/bathroom environments, compression stocking mgt strategies, positioning during sleep, and car transfer techniques. Pt verbalized understanding but would benefit from additional instruction in self care skills and techniques to help maintain precautions with or without assistive devices to support recall and carryover prior to discharge. At end of session pt able to independently recall 2/3 precautions, requiring a verbal cue for the 3rd. Pt will benefit from skilled OT services while in the hospital to address noted impairments and functional deficits in order to maximize return to PLOF and minimize risk of falls. Good family support and home set up. Recommend 3:1 and reacher for use at home to assist in increasing  independence and maintaining hip precautions. Do not anticipate skilled OT needs after discharge.     Follow Up Recommendations  No OT follow up    Equipment Recommendations  3 in 1 bedside commode;Other (comment)(reacher)    Recommendations for Other Services       Precautions / Restrictions Precautions Precautions: Fall;Posterior Hip Precaution Booklet Issued: Yes (comment) Precaution Comments: Pt able to recall 1/3 precautions at start of session. Extensive education/training provided, able to recall 2/3 independently at end of session, required verbal cue for 3rd Restrictions Weight Bearing Restrictions: Yes RLE Weight Bearing: Weight bearing as tolerated      Mobility Bed Mobility     General bed mobility comments: deferred, up in recliner  Transfers Overall transfer level: Needs assistance Equipment used: Rolling [place] (2 wheeled) Transfers: Sit to/from Stand Sit to Stand: Min guard         General transfer comment: VCs for posterior THPs    Balance Overall balance assessment: Needs assistance Sitting-balance support: No upper extremity supported;Feet supported Sitting balance-Leahy Scale: Good Sitting balance - Comments: steady sitting reaching within BOS   Standing balance support: No upper extremity supported Standing balance-Leahy Scale: Fair Standing balance comment: pt able to static stand without UE support safely                           ADL either performed or assessed with clinical judgement   ADL Overall ADL's : Needs assistance/impaired Eating/Feeding: Independent   Grooming: Independent   Upper Body Bathing: Sitting;Modified independent   Lower Body Bathing: Sit to/from stand;Minimal assistance   Upper Body Dressing : Sitting;Modified independent   Lower Body Dressing: Sit to/from  stand;Minimal assistance Lower Body Dressing Details (indicate cue type and reason): pt instructed in AE for LB dressing to maintain  precautions Toilet Transfer: RW;Min guard;Ambulation Toilet Transfer Details (indicate cue type and reason): BSC over toilet, VC for precautions, instructed pt to use 3:1 over commode at home to maintain hip precautions Toileting- Clothing Manipulation and Hygiene: Independent       Functional mobility during ADLs: Min guard;Rolling [place] General ADL Comments: pt instructed in compression stocking mgt and how to instruct significant other in assisting. Would benefit from additional training     Vision Baseline Vision/History: No visual deficits Patient Visual Report: No change from baseline       Perception     Praxis      Pertinent Vitals/Pain Pain Assessment: 0-10 Pain Score: 2  Pain Location: R hip Pain Descriptors / Indicators: Sore Pain Intervention(s): Limited activity within patient's tolerance;Monitored during session;Repositioned     Hand Dominance Right   Extremity/Trunk Assessment Upper Extremity Assessment Upper Extremity Assessment: Overall WFL for tasks assessed   Lower Extremity Assessment Lower Extremity Assessment: Defer to PT evaluation;RLE deficits/detail;LLE deficits/detail RLE Deficits / Details: at least 3/5 AROM hip flexion; at least 3+/5 knee flexion/extension and DF RLE: Unable to fully assess due to pain LLE Deficits / Details: at least 4/5 hip flexion, knee flexion/extension, and DF   Cervical / Trunk Assessment Cervical / Trunk Assessment: Normal   Communication Communication Communication: No difficulties   Cognition Arousal/Alertness: Awake/alert Behavior During Therapy: WFL for tasks assessed/performed Overall Cognitive Status: Within Functional Limits for tasks assessed                                     General Comments  R hip dressing and hemovac in place beginning and end of session    Exercises Other Exercises Other Exercises: Pt instructed in falls prevention strategies including proper footwear during  recovery while wearing compression stockings. Pt verbalized understanding. Other Exercises: Pt instructed in sleeping positioning with pillows between legs to minimize risk of unintentionally crossing his legs or internally rotating his hip. Educated in use of pillows between knees while sidelying once precautions are no longer in place to support joint alignment Other Exercises: Pt instructed in functional mobility training with RW while maintaining THPs within kitchen/bathroom environment with counter support as well as techniques for performing car transfers with visual demo.   Shoulder Instructions      Home Living Family/patient expects to be discharged to:: Private residence Living Arrangements: Spouse/significant other(Significant other of 19 years) Available Help at Discharge: Friend(s);Family(sig other) Type of Home: House Home Access: Stairs to enter [employer] of Steps: 4-5 steps with B rail from front; 7-8 with L rail from side entrance   Home Layout: One level     Bathroom Shower/Tub: Teacher, early years/pre: Standard     Home Equipment: Crutches          Prior Functioning/Environment Level of Independence: Independent        Comments: Pt reports no falls in past 6 months. Indep with mobility, ADL, IADL including driving and working full time as a Building control surveyor (on his feet a lot, steel toe boots on concrete floors). Did endorse increasing difficulty with LB dressing 2:2 R hip pain.        OT Problem List: Decreased knowledge of use of DME or AE;Decreased knowledge of precautions;Impaired balance (sitting and/or standing);Pain  OT Treatment/Interventions: Self-care/ADL training;Balance training;Therapeutic exercise;Therapeutic activities;DME and/or AE instruction;Patient/family education    OT Goals(Current goals can be found in the care plan section) Acute Rehab OT Goals Patient Stated Goal: to go home OT Goal Formulation: With  patient Time For Goal Achievement: 08/17/17 Potential to Achieve Goals: Good ADL Goals Pt Will Perform Lower Body Dressing: with modified independence;with adaptive equipment;sit to/from stand(maintaining posterior THPs) Pt Will Transfer to Toilet: with supervision;ambulating(3:1 over toilet, LRAD for amb, maintaining posterior THPs) Additional ADL Goal #1: Pt will verbalize 3/3 posterior total hip precautions and demo understanding during ADL and mobility tasks, 5/5 opportunities to maximize adherence and safety.  OT Frequency: Min 1X/week   Barriers to D/C:            Co-evaluation              AM-PAC PT "6 Clicks" Daily Activity     Outcome Measure Help from another person eating meals?: None Help from another person taking care of personal grooming?: None Help from another person toileting, which includes using toliet, bedpan, or urinal?: A Little Help from another person bathing (including washing, rinsing, drying)?: A Little Help from another person to put on and taking off regular upper body clothing?: None Help from another person to put on and taking off regular lower body clothing?: A Little 6 Click Score: 21   End of Session Equipment Utilized During Treatment: Gait belt;Rolling [place]  Activity Tolerance: Patient tolerated treatment well Patient left: in chair;with call bell/phone within reach;with chair alarm set;with SCD's reapplied  OT Visit Diagnosis: Other abnormalities of gait and mobility (R26.89);Pain Pain - Right/Left: Right Pain - part of body: Hip                Time: 3570-1779 OT Time Calculation (min): 29 min Charges:  OT General Charges $OT Visit: 1 Visit OT Evaluation $OT Eval Low Complexity: 1 Low OT Treatments $Self Care/Home Management : 23-37 mins  Jeni Salles, MPH, MS, OTR/L ascom [phone] 08/03/17, 10:30 AM

## 2017-08-03 NOTE — Progress Notes (Signed)
   Subjective: 1 Day Post-Op Procedure(s) (LRB): TOTAL HIP ARTHROPLASTY (Right) Patient reports pain as Minimal.   Patient is well, and has had no acute complaints or problems We will start therapy today.  Plan is to go Home after hospital stay. no nausea and no vomiting Patient denies any chest pains or shortness of breath. Objective: Vital signs in last 24 hours: Temp:  [97 F (36.1 C)-98.3 F (36.8 C)] 97.7 F (36.5 C) (07/02 0343) Pulse Rate:  [52-71] 70 (07/02 0343) Resp:  [12-22] 18 (07/01 2304) BP: (118-172)/(72-107) 150/101 (07/02 0343) SpO2:  [95 %-100 %] 99 % (07/02 0343) well approximated incision Heels are non tender and elevated off the bed using rolled towels Intake/Output from previous day: 07/01 0701 - 07/02 0700 In: 3793.3 [P.O.:480; I.V.:2413.3; IV Piggyback:900] Out: 6811 [Urine:3425; Drains:350; Blood:100] Intake/Output this shift: No intake/output data recorded.  No results for input(s): HGB in the last 72 hours. No results for input(s): WBC, RBC, HCT, PLT in the last 72 hours. No results for input(s): NA, K, CL, CO2, BUN, CREATININE, GLUCOSE, CALCIUM in the last 72 hours. No results for input(s): LABPT, INR in the last 72 hours.  EXAM General - Patient is Alert, Appropriate and Oriented Extremity - Neurologically intact Neurovascular intact Sensation intact distally Intact pulses distally Dorsiflexion/Plantar flexion intact No cellulitis present Compartment soft Dressing - dressing C/D/I Motor Function - intact, moving foot and toes well on exam.    Past Medical History:  Diagnosis Date  . Arthritis   . Avascular necrosis of femoral head, right ([place]) 2019  . GERD (gastroesophageal reflux disease)   . Gout   . Hypertension   . Peptic ulcer disease     Assessment/Plan: 1 Day Post-Op Procedure(s) (LRB): TOTAL HIP ARTHROPLASTY (Right) Active Problems:   Status post total replacement of hip  Estimated body mass index is 28.48 kg/m as  calculated from the following:   Height as of this encounter: 6' (1.829 m).   Weight as of this encounter: 95.3 kg (210 lb). Advance diet Up with therapy D/C IV fluids Plan for discharge tomorrow Discharge home with home health  Labs: None DVT Prophylaxis - Lovenox, Foot Pumps and TED hose Weight-Bearing as tolerated to right leg D/C O2 and Pulse OX and try on Room Air Begin working on bowel movement  Jon R. Kay Volant 08/03/2017, 7:58 AM

## 2017-08-03 NOTE — Progress Notes (Signed)
Clinical Social Worker (CSW) received SNF consult. PT is recommending home health. RN case manager aware of above. Please reconsult if future social work needs arise. CSW signing off.   Andersen Mckiver, LCSW [phone] 

## 2017-08-03 NOTE — Anesthesia Postprocedure Evaluation (Signed)
Anesthesia Post Note  Patient: Evan Sullivan  Procedure(s) Performed: TOTAL HIP ARTHROPLASTY (Right Hip)  Patient location during evaluation: PACU Anesthesia Type: Spinal Level of consciousness: awake and alert Pain management: pain level controlled Vital Signs Assessment: post-procedure vital signs reviewed and stable Respiratory status: spontaneous breathing, nonlabored ventilation, respiratory function stable and patient connected to nasal cannula oxygen Cardiovascular status: blood pressure returned to baseline and stable Postop Assessment: no apparent nausea or vomiting Anesthetic complications: no     Last Vitals:  Vitals:   08/03/17 0021 08/03/17 0343  BP: (!) 150/86 (!) 150/101  Pulse:  70  Resp:    Temp:  36.5 C  SpO2:  99%    Last Pain:  Vitals:   08/03/17 0608  TempSrc:   PainSc: [place]

## 2017-08-03 NOTE — Evaluation (Signed)
Physical Therapy Evaluation Patient Details Name: Evan Sullivan MRN: [id number] DOB: 07/02/1970 Today's Date: 08/03/2017   History of Present Illness  Pt is a 47 y.o. 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  PMH includes htn, PUD, GERD, anemia, avascular necrosis R femoral head, gout.  Clinical Impression  Prior to hospital admission, pt was independent with functional mobility.  Pt lives with his significant other in 1 level home with stairs to enter home.  Currently pt is min assist supine to sit; CGA with transfers; and CGA ambulating 100 feet with RW.  Vc's required for gait technique and also posterior hip precautions with functional activities.  Pain 0/10 R hip beginning of session and 3/10 end of session.  Pt would benefit from skilled PT to address noted impairments and functional limitations (see below for any additional details).  Upon hospital discharge, recommend pt discharge to home with HHPT.    Follow Up Recommendations Home health PT    Equipment Recommendations  Rolling [place] with 5" wheels;3in1 (PT)    Recommendations for Other Services OT consult     Precautions / Restrictions Precautions Precautions: Fall;Posterior Hip Precaution Booklet Issued: Yes (comment) Restrictions Weight Bearing Restrictions: Yes RLE Weight Bearing: Weight bearing as tolerated      Mobility  Bed Mobility Overal bed mobility: Needs Assistance Bed Mobility: Supine to Sit     Supine to sit: Min assist;HOB elevated     General bed mobility comments: assist for R LE supine to sit; mild increased time to perform with vc's for technique and posterior hip precautions  Transfers Overall transfer level: Needs assistance Equipment used: Rolling [place] (2 wheeled) Transfers: Sit to/from Stand Sit to Stand: Min guard         General transfer comment: vc's for posterior hip precautions with transfers sit to/from stand; vc's for UE and LE placement; strong  stand  Ambulation/Gait Ambulation/Gait assistance: Min guard Gait Distance (Feet): 100 Feet Assistive device: Rolling [place] (2 wheeled) Gait Pattern/deviations: Step-to pattern Gait velocity: decreased   General Gait Details: vc's required for Kautzman use, gait technique, posterior hip precautions with turns, and to stay within RW with turns  Stairs            Wheelchair Mobility    Modified Rankin (Stroke Patients Only)       Balance Overall balance assessment: Needs assistance Sitting-balance support: No upper extremity supported;Feet supported Sitting balance-Leahy Scale: Good Sitting balance - Comments: steady sitting reaching within BOS   Standing balance support: No upper extremity supported Standing balance-Leahy Scale: Fair Standing balance comment: pt able to static stand without UE support safely                             Pertinent Vitals/Pain Pain Assessment: 0-10 Pain Score: 3  Pain Location: R hip Pain Descriptors / Indicators: Sore Pain Intervention(s): Limited activity within patient's tolerance;Monitored during session;Premedicated before session;Repositioned;Other (comment)(NT notified pt requesting ice for his ice pack)  HR and O2 sats on room air WFL during session    [place] expects to be discharged to:: Private residence Living Arrangements: Spouse/significant other(Significant other of 19 years) Available Help at Discharge: Friend(s) Type of Home: House Home Access: Stairs to enter   [employer] of Steps: 4-5 steps with B rail from front; 7-8 with L rail from side entrance Home Layout: One level Home Equipment: Crutches      Prior Function Level of Independence:  Independent         Comments: Pt reports no falls in past 6 months.     Hand Dominance        Extremity/Trunk Assessment   Upper Extremity Assessment Upper Extremity Assessment: Overall WFL for tasks assessed    Lower  Extremity Assessment Lower Extremity Assessment: RLE deficits/detail;LLE deficits/detail RLE Deficits / Details: at least 3/5 AROM hip flexion; at least 3+/5 knee flexion/extension and DF RLE: Unable to fully assess due to pain LLE Deficits / Details: at least 4/5 hip flexion, knee flexion/extension, and DF    Cervical / Trunk Assessment Cervical / Trunk Assessment: Normal  Communication   Communication: No difficulties  Cognition Arousal/Alertness: Awake/alert Behavior During Therapy: WFL for tasks assessed/performed Overall Cognitive Status: Within Functional Limits for tasks assessed                                        General Comments General comments (skin integrity, edema, etc.): R hip dressing and hemovac in place beginning and end of session.  Nursing cleared pt for participation in physical therapy.  Pt agreeable to PT session.    Exercises Total Joint Exercises Ankle Circles/Pumps: AROM;Strengthening;Both;10 reps;Supine Quad Sets: AROM;Strengthening;Both;10 reps;Supine Gluteal Sets: AROM;Strengthening;Both;10 reps;Supine Towel Squeeze: AROM;Strengthening;Both;10 reps;Supine(pillow between pt's knees) Short Arc Quad: AROM;Strengthening;Right;10 reps;Supine Heel Slides: AROM;Strengthening;Right;10 reps;Supine Hip ABduction/ADduction: AAROM;Strengthening;Right;10 reps;Supine   Assessment/Plan    PT Assessment Patient needs continued PT services  PT Problem List Decreased strength;Decreased balance;Decreased mobility;Decreased activity tolerance;Decreased knowledge of use of DME;Decreased knowledge of precautions;Pain;Decreased skin integrity       PT Treatment Interventions DME instruction;Gait training;Stair training;Functional mobility training;Therapeutic activities;Therapeutic exercise;Balance training;Patient/family education    PT Goals (Current goals can be found in the Care Plan section)  Acute Rehab PT Goals Patient Stated Goal: to go  home PT Goal Formulation: With patient Time For Goal Achievement: 08/17/17 Potential to Achieve Goals: Good    Frequency BID   Barriers to discharge        Co-evaluation               AM-PAC PT "6 Clicks" Daily Activity  Outcome Measure Difficulty turning over in bed (including adjusting bedclothes, sheets and blankets)?: A Little Difficulty moving from lying on back to sitting on the side of the bed? : Unable Difficulty sitting down on and standing up from a chair with arms (e.g., wheelchair, bedside commode, etc,.)?: A Little Help needed moving to and from a bed to chair (including a wheelchair)?: A Little Help needed walking in hospital room?: A Little Help needed climbing 3-5 steps with a railing? : A Little 6 Click Score: 16    End of Session Equipment Utilized During Treatment: Gait belt Activity Tolerance: Patient tolerated treatment well Patient left: in chair;with call bell/phone within reach;with chair alarm set;with SCD's reapplied;Other (comment)(pillow between pt's knees for posterior hip precautions; B heels elevated via towel rolls) Nurse Communication: Mobility status;Precautions;Weight bearing status;Other (comment)(Pt's BP and pain status end of session) PT Visit Diagnosis: Other abnormalities of gait and mobility (R26.89);Muscle weakness (generalized) (M62.81);Difficulty in walking, not elsewhere classified (R26.2);Pain Pain - Right/Left: Right Pain - part of body: Hip    Time: 0835-0920 PT Time Calculation (min) (ACUTE ONLY): 45 min   Charges:   PT Evaluation $PT Eval Low Complexity: 1 Low PT Treatments $Gait Training: 8-22 mins $Therapeutic Exercise: 8-22 mins   PT G Codes:  Leitha Bleak, PT 08/03/17, 9:41 AM [phone]

## 2017-08-03 NOTE — Consult Note (Signed)
Patient Demographics  Evan Sullivan, is a 47 y.o. 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  Patient had right THA on July 1.  We were called for management of hypertension.  The patient has long-standing history of hypertension and takes atenolol,  amlodipine, hydrochlorothiazide.  Patient started on amlodipine recently 6 days ago by PCP in preparation for surgery.  Patient BP has been running high, diastolic BP more than 474 and he received all his home by BP medicines and also Norvasc 10 mg daily.  Patient is started on Norvasc 5 mg daily.  He denies any complaints now.  According to him BP has been running high because of that PCP added amlodipine recently.  Past Medical History:  Diagnosis Date  . Arthritis   . Avascular necrosis of femoral head, right ([place]) 2019  . GERD (gastroesophageal reflux disease)   . Gout   . Hypertension   . Peptic ulcer disease       Past Surgical History:  Procedure Laterality Date  . COLONOSCOPY    . KNEE ARTHROSCOPY, MEDIAL PATELLO FEMORAL LIGAMENT REPAIR Bilateral 2004,2000  . TOTAL HIP ARTHROPLASTY Right 08/02/2017   Procedure: TOTAL HIP ARTHROPLASTY;  Surgeon: Dereck Leep, MD;  Location: [facility] ORS;  Service: Orthopedics;  Laterality: Right;       Review of Systems    In addition to the HPI above,  No Fever-chills, No Headache, No changes with Vision or hearing, No problems swallowing food or Liquids, No Chest pain, Cough or Shortness of  Breath, No Abdominal pain, No Nausea or Vommitting, Bowel movements are regular, No Blood in stool or Urine, No dysuria, No new skin rashes or bruises, Minimal right hip pain with PT. No new weakness, tingling, numbness in any extremity, No recent weight gain or loss, No polyuria, polydypsia or polyphagia, No significant Mental Stressors.  A full 10 point Review of Systems was done, except as stated above, all other Review of Systems were negative.   Social History Social History   Tobacco Use  . Smoking status: Never Smoker  . Smokeless tobacco: Never Used  Substance Use Topics  . Alcohol use: Yes    Alcohol/week: 3.6 oz    Types: 6 Cans of beer per week    Comment: 2 cans of beer most days     Family History Family History  Problem Relation Age of Onset  . Hypertension Mother   . Hypertension Father   . Colon cancer Father      Prior to Admission medications   Medication Sig Start Date End Date Taking? Authorizing Provider  acetaminophen (TYLENOL) 500 MG tablet Take 1,000 mg  by mouth daily as needed for moderate pain.   Yes [provider]  amLODipine (NORVASC) 5 MG tablet Take 1 tablet (5 mg total) by mouth daily. 07/28/17  Yes Lavera Guise, MD  atenolol (TENORMIN) 100 MG tablet Take 1 tablet (100 mg total) by mouth daily. 04/15/17  Yes Boscia, Greer Ee, NP  colchicine (COLCRYS) 0.6 MG tablet Take 0.6 mg by mouth daily.   Yes [provider]  Colchicine (MITIGARE) 0.6 MG CAPS Take 1 capsule by mouth daily for 1 dose. Patient taking differently: Take 1 capsule by mouth 2 (two) times daily as needed (gout).  05/26/17 07/14/17 Yes Boscia, Greer Ee, NP  hydrochlorothiazide (HYDRODIURIL) 25 MG tablet Take 1 tablet (25 mg total) by mouth daily. 04/15/17  Yes Boscia, Greer Ee, NP  hydrocortisone cream 1 % Apply 1 application topically daily as needed (rash).   Yes [provider]  pantoprazole (PROTONIX) 40 MG tablet Take 40 mg by mouth daily as  needed (acid reflux).    Yes [provider]  enoxaparin (LOVENOX) 40 MG/0.4ML injection Inject 0.4 mLs (40 mg total) into the skin daily for 14 days. 08/05/17 08/19/17  Watt Climes, PA  oxyCODONE (OXY IR/ROXICODONE) 5 MG immediate release tablet Take 1 tablet (5 mg total) by mouth every 4 (four) hours as needed for moderate pain (pain score 4-6). 08/03/17   Watt Climes, PA  traMADol (ULTRAM) 50 MG tablet Take 1-2 tablets (50-100 mg total) by mouth every 4 (four) hours as needed for moderate pain. 08/03/17   Watt Climes, PA    Anti-infectives (From admission, onward)   Start     Dose/Rate Route Frequency Ordered Stop   08/02/17 1400  ceFAZolin (ANCEF) IVPB 2g/100 mL premix     2 g 200 mL/hr over 30 Minutes Intravenous Every 6 hours 08/02/17 1255 08/03/17 0848   08/02/17 0633  ceFAZolin (ANCEF) 2-4 GM/100ML-% IVPB    Note to Pharmacy:  Cleatis Polka   : cabinet override      08/02/17 0633 08/02/17 0723   08/02/17 0600  ceFAZolin (ANCEF) IVPB 2g/100 mL premix     2 g 200 mL/hr over 30 Minutes Intravenous On call to O.R. 08/02/17 0131 08/02/17 0733      Scheduled Meds: . amLODipine  5 mg Oral Daily  . atenolol  100 mg Oral Daily  . celecoxib  200 mg Oral BID  . colchicine  0.6 mg Oral Daily  . enoxaparin (LOVENOX) injection  30 mg Subcutaneous Q12H  . famotidine  20 mg Oral Once  . ferrous sulfate  325 mg Oral BID WC  . gabapentin  300 mg Oral QHS  . hydrochlorothiazide  25 mg Oral Daily  . magnesium hydroxide  30 mL Oral Daily  . metoCLOPramide  10 mg Oral TID AC & HS  . pantoprazole  40 mg Oral BID  . senna-docusate  1 tablet Oral BID   Continuous Infusions: . sodium chloride Stopped (08/03/17 1039)  . acetaminophen Stopped (08/03/17 0533)   PRN Meds:.acetaminophen, alum & mag hydroxide-simeth, bisacodyl, diphenhydrAMINE, hydrocortisone cream, HYDROmorphone (DILAUDID) injection, menthol-cetylpyridinium **OR** phenol, metoCLOPramide **OR** metoCLOPramide (REGLAN) injection,  ondansetron **OR** ondansetron (ZOFRAN) IV, oxyCODONE, oxyCODONE, sodium phosphate, traMADol  No Known Allergies  Physical Exam  Vitals  Blood pressure (!) 139/100, pulse 68, temperature 98.6 F (37 C), temperature source Oral, resp. rate 18, height 6' (1.829 m), weight 95.3 kg (210 lb), SpO2 99 %.   1. General ; alert, awake, oriented  2. Normal affect and insight, Not Suicidal or Homicidal, Awake Alert, Oriented X 3.  3. No F.N deficits, ALL C.Nerves Intact, Strength 5/5 all 4 extremities, Sensation intact all 4 extremities, Plantars down going.  4. Ears and Eyes appear Normal, Conjunctivae clear, PERRLA. Moist Oral Mucosa.  5. Supple Neck, No JVD, No cervical lymphadenopathy appriciated, No Carotid Bruits.  6. Symmetrical Chest wall movement, Good air movement bilaterally, CTAB.  7. RRR, No Gallops, Rubs or Murmurs, No Parasternal Heave.  8. Positive Bowel Sounds, Abdomen Soft, No tenderness, No organomegaly appriciated,No rebound -guarding or rigidity.  9.  No Cyanosis, Normal Skin Turgor, No Skin Rash or Bruise.  10. Good muscle tone,  joints appear normal , no effusions, Normal ROM.  11. No Palpable Lymph Nodes in Neck or Axillae    Data Review  CBC No results for input(s): WBC, HGB, HCT, PLT, MCV, MCH, MCHC, RDW, LYMPHSABS, MONOABS, EOSABS, BASOSABS, BANDABS in the last 168 hours.  Invalid input(s): NEUTRABS, BANDSABD ------------------------------------------------------------------------------------------------------------------  Chemistries  No results for input(s): NA, K, CL, CO2, GLUCOSE, BUN, CREATININE, CALCIUM, MG, AST, ALT, ALKPHOS, BILITOT in the last 168 hours.  Invalid input(s): GFRCGP ------------------------------------------------------------------------------------------------------------------ estimated creatinine clearance is 117.6 mL/min (by C-G formula based on SCr of 0.94  mg/dL). ------------------------------------------------------------------------------------------------------------------ No results for input(s): TSH, T4TOTAL, T3FREE, THYROIDAB in the last 72 hours.  Invalid input(s): FREET3   Coagulation profile No results for input(s): INR, PROTIME in the last 168 hours. ------------------------------------------------------------------------------------------------------------------- No results for input(s): DDIMER in the last 72 hours. -------------------------------------------------------------------------------------------------------------------  Cardiac Enzymes No results for input(s): CKMB, TROPONINI, MYOGLOBIN in the last 168 hours.  Invalid input(s): CK ------------------------------------------------------------------------------------------------------------------ Invalid input(s): POCBNP   ---------------------------------------------------------------------------------------------------------------  Urinalysis    Component Value Date/Time   COLORURINE YELLOW (A) 07/20/2017 0950   APPEARANCEUR CLEAR (A) 07/20/2017 0950   LABSPEC 1.019 07/20/2017 0950   PHURINE 5.0 07/20/2017 0950   GLUCOSEU NEGATIVE 07/20/2017 0950   HGBUR NEGATIVE 07/20/2017 0950   BILIRUBINUR NEGATIVE 07/20/2017 0950   KETONESUR NEGATIVE 07/20/2017 0950   PROTEINUR 100 (A) 07/20/2017 0950   NITRITE NEGATIVE 07/20/2017 0950   LEUKOCYTESUR NEGATIVE 07/20/2017 0950     Imaging results:   Dg Hip Port Unilat With Pelvis 1v Right  Result Date: 08/02/2017 CLINICAL DATA:  Status post right hip replacement EXAM: DG HIP (WITH OR WITHOUT PELVIS) 1V PORT RIGHT COMPARISON:  None. FINDINGS: Right hip replacement is well visualized. No acute bony or soft tissue abnormality is seen. Surgical drains are noted in place. IMPRESSION: Status post right hip replacement. Electronically Signed   By: Inez Catalina M.D.   On: 08/02/2017 12:22        Assessment &  Plan  Active Problems:   Status post total replacement of hip    1.  Accelerated hypertension: Presently patient is on amlodipine 5 mg daily, atenolol 100 mg p.o. daily, HCTZ 25 mg p.o. daily, recommend discontinue IV fluids, increase amlodipine to 10 mg daily at discharge, continue other home medicine including atenolol 100 mg daily, HCTZ 25 mg daily, will add hydralazine 20 mg every 8 hours, watch overnight for any further medication adjustment.      DVT Prophylaxis Heparin -  Lovenox -   AM Labs Ordered, also please review Full Orders  Family Communication: Plan discussed with patient  Total time spent on this consult 55 minutes  Thank you for the consult, we will follow the patient with you in the Hospital.   Epifanio Lesches M.D on 08/03/2017 at 10:45 AM

## 2017-08-03 NOTE — Plan of Care (Signed)
  Problem: Education: Goal: Knowledge of General Education information will improve Outcome: Progressing   Problem: Health Behavior/Discharge Planning: Goal: Ability to manage health-related needs will improve Outcome: Progressing   Problem: Clinical Measurements: Goal: Ability to maintain clinical measurements within normal limits will improve Outcome: Progressing Goal: Will remain free from infection Outcome: Progressing Goal: Diagnostic test results will improve Outcome: Progressing Goal: Respiratory complications will improve Outcome: Progressing Goal: Cardiovascular complication will be avoided Outcome: Progressing   Problem: Activity: Goal: Risk for activity intolerance will decrease Outcome: Progressing   Problem: Nutrition: Goal: Adequate nutrition will be maintained Outcome: Progressing   Problem: Coping: Goal: Level of anxiety will decrease Outcome: Progressing   Problem: Elimination: Goal: Will not experience complications related to bowel motility Outcome: Progressing Goal: Will not experience complications related to urinary retention Outcome: Progressing   Problem: Pain Managment: Goal: General experience of comfort will improve Outcome: Progressing

## 2017-08-04 LAB — SURGICAL PATHOLOGY

## 2017-08-04 MED ORDER — AMLODIPINE BESYLATE 10 MG PO TABS: 10.0000 mg | ORAL_TABLET | Freq: Every day | ORAL | Status: DC

## 2017-08-04 MED ORDER — AMLODIPINE BESYLATE 5 MG PO TABS
5.0000 mg | ORAL_TABLET | Freq: Once | ORAL | Status: AC
  Administered 2017-08-04: 5 mg via ORAL
  Filled 2017-08-04: qty 1

## 2017-08-04 MED ORDER — AMLODIPINE BESYLATE 10 MG PO TABS: 10.0000 mg | ORAL_TABLET | Freq: Every day | ORAL | 0 refills | Status: DC

## 2017-08-04 MED ORDER — LACTULOSE 10 GM/15ML PO SOLN: 10.0000 g | Freq: Two times a day (BID) | ORAL | Status: DC | PRN

## 2017-08-04 NOTE — Progress Notes (Signed)
Discharge instructions reviewed with patient. Prescription given. Patient voiced comfort with Lovenox injections.  IV removed without complication. Additional dressings given to patient. Appointment reminders highlighted. Patient with no questions at this time.

## 2017-08-04 NOTE — Care Management Note (Signed)
Case Management Note  Patient Details  Name: Evan Sullivan MRN: [id number] Date of Birth: [birth year]  Subjective/Objective:  Discharging today                  Action/Plan: [facility] notified of discharge.   Expected Discharge Date:  08/04/17               Expected Discharge Plan:  [place]  In-House Referral:     Discharge planning Services  CM Consult  Post Acute Care Choice:  Durable Medical Equipment, Home Health Choice offered to:  Patient  DME Arranged:  3-N-1, [place] rolling DME Agency:  [place]:  PT [place]:  [facility] (now [facility])  Status of Service:  Completed, signed off  If discussed at [employer] of Stay Meetings, dates discussed:    Additional Comments:  Jolly Mango, RN 08/04/2017, 9:18 AM

## 2017-08-04 NOTE — Progress Notes (Signed)
Physical Therapy Treatment Patient Details Name: Evan Sullivan MRN: [id number] DOB: [birth year] Today's Date: 08/04/2017    History of Present Illness Pt is a 47 y.o. 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  PMH includes HTN, PUD, GERD, anemia, avascular necrosis R femoral head, gout.     PT Comments    Pt modified independent with bed mobility, transfers, and ambulating around nursing loop with RW.  Able to ascend/descend 8 stairs with B railings SBA safely.  Pain 2/10 R hip during session ("sore").  Pt able to maintain and verbalize posterior hip precautions during session.  Pt appears safe to discharge home with HHPT when medically appropriate.    Follow Up Recommendations  Home health PT     Equipment Recommendations  Rolling Mcfate with 5" wheels;3in1 (PT)    Recommendations for Other Services OT consult     Precautions / Restrictions Precautions Precautions: Fall;Posterior Hip Precaution Booklet Issued: Yes (comment) Precaution Comments: Pt able to recall 3/3 posterior hip precautions beginning and end of session with increased time. Restrictions Weight Bearing Restrictions: Yes RLE Weight Bearing: Weight bearing as tolerated    Mobility  Bed Mobility Overal bed mobility: Modified Independent Bed Mobility: Supine to Sit;Sit to Supine     Supine to sit: Modified independent (Device/Increase time) Sit to supine: Modified independent (Device/Increase time)   General bed mobility comments: increased time/effort to perform but no physical assist required; able to maintain posterior hip precautions  Transfers Overall transfer level: Modified independent Equipment used: Rolling [place] (2 wheeled) Transfers: Sit to/from [facility] Sit to Stand: Modified independent (Device/Increase time) Stand pivot transfers: Modified independent (Device/Increase time)       General transfer comment: strong steady transfers; no vc's required for  posterior hip precautions  Ambulation/Gait Ambulation/Gait assistance: Modified independent (Device/Increase time) Gait Distance (Feet): 300 Feet Assistive device: Rolling Mccumbers (2 wheeled)   Gait velocity: mildly decreased   General Gait Details: initially step to pattern but with cueing improved to step through gait pattern; vc's to increase R knee flexion during R LE swing phase; good R heel strike   Stairs Stairs: Yes Stairs assistance: Supervision Stair Management: Step to pattern;Forwards;Two rails Number of Stairs: 8 General stair comments: initial vc's for technique/sequencing and then pt able to perform on own safely   Wheelchair Mobility    Modified Rankin (Stroke Patients Only)       Balance Overall balance assessment: Needs assistance Sitting-balance support: No upper extremity supported;Feet supported Sitting balance-Leahy Scale: Normal Sitting balance - Comments: steady sitting reaching outside BOS (within posterior hip precautions)   Standing balance support: No upper extremity supported Standing balance-Leahy Scale: Good Standing balance comment: steady standing reaching within BOS                            Cognition Arousal/Alertness: Awake/alert Behavior During Therapy: WFL for tasks assessed/performed Overall Cognitive Status: Within Functional Limits for tasks assessed                                        Exercises      General Comments General comments (skin integrity, edema, etc.): R hip dressing in place.  Nursing cleared pt for participation in physical therapy.  Pt agreeable to PT session.      Pertinent Vitals/Pain Pain Assessment: 0-10 Pain Score: 2  Pain Location:  R hip Pain Descriptors / Indicators: Sore Pain Intervention(s): Limited activity within patient's tolerance;Monitored during session;Premedicated before session;Repositioned    Home Living                      Prior Function             PT Goals (current goals can now be found in the care plan section) Acute Rehab PT Goals Patient Stated Goal: to go home PT Goal Formulation: With patient Time For Goal Achievement: 08/17/17 Potential to Achieve Goals: Good Progress towards PT goals: Progressing toward goals    Frequency    BID      PT Plan Current plan remains appropriate    Co-evaluation              AM-PAC PT "6 Clicks" Daily Activity  Outcome Measure  Difficulty turning over in bed (including adjusting bedclothes, sheets and blankets)?: A Little Difficulty moving from lying on back to sitting on the side of the bed? : A Little Difficulty sitting down on and standing up from a chair with arms (e.g., wheelchair, bedside commode, etc,.)?: None Help needed moving to and from a bed to chair (including a wheelchair)?: None Help needed walking in hospital room?: None Help needed climbing 3-5 steps with a railing? : A Little 6 Click Score: 21    End of Session Equipment Utilized During Treatment: Gait belt Activity Tolerance: Patient tolerated treatment well Patient left: in chair;with call bell/phone within reach;with chair alarm set;with SCD's reapplied;Other (comment)(pillow between pt's knees for posterior hip precautions; towel rolls under B ankles to offload B heels) Nurse Communication: Mobility status;Precautions;Weight bearing status PT Visit Diagnosis: Other abnormalities of gait and mobility (R26.89);Muscle weakness (generalized) (M62.81);Difficulty in walking, not elsewhere classified (R26.2);Pain Pain - Right/Left: Right Pain - part of body: Hip     Time: 0926-0949 PT Time Calculation (min) (ACUTE ONLY): 23 min  Charges:  $Gait Training: 8-22 mins $Therapeutic Activity: 8-22 mins                    G CodesLeitha Bleak, PT 08/04/17, 10:03 AM [phone]

## 2017-08-04 NOTE — Progress Notes (Signed)
   Subjective: 2 Days Post-Op Procedure(s) (LRB): TOTAL HIP ARTHROPLASTY (Right) Patient reports pain as 2 on 0-10 scale.   Patient is well, and has had no acute complaints or problems Patient did very well with physical therapy yesterday.  Met all goals except for doing stairs. Plan is to go Home after hospital stay. no nausea and no vomiting Patient denies any chest pains or shortness of breath. Objective: Vital signs in last 24 hours: Temp:  [97.5 F (36.4 C)-98.7 F (37.1 C)] 98.7 F (37.1 C) (07/03 0613) Pulse Rate:  [54-70] 70 (07/03 0613) Resp:  [16-18] 16 (07/03 0613) BP: (139-169)/(86-105) 145/86 (07/03 0613) SpO2:  [98 %-100 %] 100 % (07/03 4373) well approximated incision Heels are non tender and elevated off the bed using rolled towels Intake/Output from previous day: 07/02 0701 - 07/03 0700 In: 1665 [P.O.:1200; I.V.:365; IV Piggyback:100] Out: 2795 [Urine:2700; Drains:95] Intake/Output this shift: Total I/O In: 480 [P.O.:480] Out: 1020 [Urine:1000; Drains:20]  No results for input(s): HGB in the last 72 hours. No results for input(s): WBC, RBC, HCT, PLT in the last 72 hours. No results for input(s): NA, K, CL, CO2, BUN, CREATININE, GLUCOSE, CALCIUM in the last 72 hours. No results for input(s): LABPT, INR in the last 72 hours.  EXAM General - Patient is Alert, Appropriate and Oriented Extremity - Neurologically intact Neurovascular intact Sensation intact distally Intact pulses distally Dorsiflexion/Plantar flexion intact No cellulitis present Compartment soft Dressing - dressing C/D/I Motor Function - intact, moving foot and toes well on exam.    Past Medical History:  Diagnosis Date  . Arthritis   . Avascular necrosis of femoral head, right ([place]) 2019  . GERD (gastroesophageal reflux disease)   . Gout   . Hypertension   . Peptic ulcer disease     Assessment/Plan: 2 Days Post-Op Procedure(s) (LRB): TOTAL HIP ARTHROPLASTY (Right) Active  Problems:   Status post total replacement of hip  Estimated body mass index is 28.48 kg/m as calculated from the following:   Height as of this encounter: 6' (1.829 m).   Weight as of this encounter: 95.3 kg (210 lb). Up with therapy Discharge home with home health  Labs: None DVT Prophylaxis - Lovenox, Foot Pumps and TED hose Weight-Bearing as tolerated to right leg Hemovac discontinued today.  Into the drain appeared to be intact. Patient needs bowel movement today.  Lactulose ordered Please change dressing prior to patient being discharged and get the patient 2 extra honeycomb dressings to take home  [place]. [place] Holden 08/04/2017, 6:36 AM

## 2017-08-04 NOTE — Progress Notes (Signed)
Occupational Therapy Treatment Patient Details Name: Evan Sullivan MRN: [id number] DOB: 12/23/1970 Today's Date: 08/04/2017    History of present illness Pt is a 47 y.o. 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  PMH includes HTN, PUD, GERD, anemia, avascular necrosis R femoral head, gout.    OT comments  Pt seen for OT tx session this am focused on posterior THPs during functional mobility and ADL tasks as well as training in AE for LB dressing to improve independence and safety. Pt able to perform sup>sit EOB with supervision and initial verbal cue for precautions. Pt was able to verbalize 3/3 with additional time and good follow through t/o session. Pt instructed in AE for LB dressing and able to return demo with additional time to perform, and initial CGA for transition from sit>stand. Good safety awareness noted and pt able to complete donning of underwear and pants over hips with BUE off RW and no LOB. Pt progressing well towards OT goals; will continue to progress while in the hospital.   Follow Up Recommendations  No OT follow up    Equipment Recommendations  3 in 1 bedside commode;Other (comment)(reacher)    Recommendations for Other Services      Precautions / Restrictions Precautions Precautions: Fall;Posterior Hip Precaution Comments: Pt able to recall 3/3 posterior hip precautions beginning and end of session with increased time. Restrictions Weight Bearing Restrictions: Yes RLE Weight Bearing: Weight bearing as tolerated       Mobility Bed Mobility Overal bed mobility: Needs Assistance Bed Mobility: Supine to Sit     Supine to sit: Supervision;HOB elevated     General bed mobility comments: increased time/effort, no physical assist required, maintained precautions t/o  Transfers Overall transfer level: Needs assistance Equipment used: Rolling Puls (2 wheeled) Transfers: Sit to/from Stand Sit to Stand: Supervision         General transfer  comment: minimal/PRN VC for precautions with good carryover    Balance Overall balance assessment: Needs assistance Sitting-balance support: No upper extremity supported;Feet supported Sitting balance-Leahy Scale: Normal     Standing balance support: No upper extremity supported Standing balance-Leahy Scale: Good                             ADL either performed or assessed with clinical judgement   ADL Overall ADL's : Needs assistance/impaired Eating/Feeding: Independent   Grooming: Modified independent Grooming Details (indicate cue type and reason): stood at sink to brush teeth, using a cup to rinse so he didn't bend over         Upper Body Dressing : Sitting;Set up   Lower Body Dressing: With adaptive equipment;Min guard;Sit to/from stand Lower Body Dressing Details (indicate cue type and reason): pt able to don underwear and pants with AE after instruction with CGA for initial sit to stand transfer. No LOB.                     Vision Baseline Vision/History: No visual deficits Patient Visual Report: No change from baseline     Perception     Praxis      Cognition Arousal/Alertness: Awake/alert Behavior During Therapy: WFL for tasks assessed/performed Overall Cognitive Status: Within Functional Limits for tasks assessed  Exercises     Shoulder Instructions       General Comments      Pertinent Vitals/ Pain       Pain Assessment: 0-10 Pain Score: 2  Pain Location: R hip Pain Descriptors / Indicators: Sore Pain Intervention(s): Limited activity within patient's tolerance;Monitored during session;Premedicated before session;Repositioned  Home Living                                          Prior Functioning/Environment              Frequency  Min 1X/week        Progress Toward Goals  OT Goals(current goals can now be found in the care plan  section)  Progress towards OT goals: Progressing toward goals  Acute Rehab OT Goals Patient Stated Goal: to go home OT Goal Formulation: With patient Time For Goal Achievement: 08/17/17 Potential to Achieve Goals: Good  Plan Discharge plan remains appropriate;Frequency remains appropriate    Co-evaluation                 AM-PAC PT "6 Clicks" Daily Activity     Outcome Measure   Help from another person eating meals?: None Help from another person taking care of personal grooming?: None Help from another person toileting, which includes using toliet, bedpan, or urinal?: A Little Help from another person bathing (including washing, rinsing, drying)?: A Little Help from another person to put on and taking off regular upper body clothing?: None Help from another person to put on and taking off regular lower body clothing?: A Little 6 Click Score: 21    End of Session Equipment Utilized During Treatment: Rolling Collard;Gait belt  OT Visit Diagnosis: Other abnormalities of gait and mobility (R26.89);Pain Pain - Right/Left: Right Pain - part of body: Hip   Activity Tolerance Patient tolerated treatment well   Patient Left in chair;with call bell/phone within reach;with chair alarm set   Nurse Communication          Time: 7124-5809 OT Time Calculation (min): 26 min  Charges: OT General Charges $OT Visit: 1 Visit OT Treatments $Self Care/Home Management : 23-37 mins  Jeni Salles, MPH, MS, OTR/L ascom [phone] 08/04/17, 9:48 AM

## 2017-08-04 NOTE — Progress Notes (Signed)
Evan Sullivan at [facility] NAME: Evan Sullivan    MR#:  [id number]  DATE OF BIRTH:  08-Jul-1970  SUBJECTIVE:  patient out in the chair doing well. Pain much better control today.  REVIEW OF SYSTEMS:   Review of Systems  Constitutional: Negative for chills, fever and weight loss.  HENT: Negative for ear discharge, ear pain and nosebleeds.   Eyes: Negative for blurred vision, pain and discharge.  Respiratory: Negative for sputum production, shortness of breath, wheezing and stridor.   Cardiovascular: Negative for chest pain, palpitations, orthopnea and PND.  Gastrointestinal: Negative for abdominal pain, diarrhea, nausea and vomiting.  Genitourinary: Negative for frequency and urgency.  Musculoskeletal: Positive for joint pain. Negative for back pain.  Neurological: Negative for sensory change, speech change, focal weakness and weakness.  Psychiatric/Behavioral: Negative for depression and hallucinations. The patient is not nervous/anxious.    Tolerating Diet:yes Tolerating PT: HHPT  DRUG ALLERGIES:  No Known Allergies  VITALS:  Blood pressure (!) 150/93, pulse 67, temperature 98.5 F (36.9 C), temperature source Oral, resp. rate 18, height 6' (1.829 m), weight 95.3 kg (210 lb), SpO2 98 %.  PHYSICAL EXAMINATION:   Physical Exam  GENERAL:  47 y.o.-year-old patient lying in the bed with no acute distress.  EYES: Pupils equal, round, reactive to light and accommodation. No scleral icterus. Extraocular muscles intact.  HEENT: Head atraumatic, normocephalic. Oropharynx and nasopharynx clear.  NECK:  Supple, no jugular venous distention. No thyroid enlargement, no tenderness.  LUNGS: Normal breath sounds bilaterally, no wheezing, rales, rhonchi. No use of accessory muscles of respiration.  CARDIOVASCULAR: S1, S2 normal. No murmurs, rubs, or gallops.  ABDOMEN: Soft, nontender, nondistended. Bowel sounds present. No organomegaly or mass.   EXTREMITIES: No cyanosis, clubbing or edema b/l.    NEUROLOGIC: Cranial nerves II through XII are intact. No focal Motor or sensory deficits b/l.   PSYCHIATRIC:  patient is alert and oriented x 3.  SKIN: No obvious rash, lesion, or ulcer.   LABORATORY PANEL:  CBC No results for input(s): WBC, HGB, HCT, PLT in the last 168 hours.  Chemistries  No results for input(s): NA, K, CL, CO2, GLUCOSE, BUN, CREATININE, CALCIUM, MG, AST, ALT, ALKPHOS, BILITOT in the last 168 hours.  Invalid input(s): GFRCGP Cardiac Enzymes No results for input(s): TROPONINI in the last 168 hours. RADIOLOGY:  No results found. ASSESSMENT AND PLAN:   1.  Accelerated hypertension: - Presently patient is on amlodipine 5 mg daily-- increased to 10 mg daily. -Continue atenolol 100 mg p.o. daily, HCTZ 25 mg p.o. Daily -overall feels better. Recommended to get a blood pressure cuff at home document BP readings at home for primary care physician to review.  2. Avascular necrosis of   right hip status post total hip replacement by Dr.Hooten -management per orthopedic  Patient will discharge home later today. Okay from a medical standpoint. Follow-up with primary care physician for hypertension.    Case discussed with Care Management/Social Worker. Management plans discussed with the patient, family and they are in agreement.  CODE STATUS: full  DVT Prophylaxis:lovenox  TOTAL TIME TAKING CARE OF THIS PATIENT: *25* minutes.  >50% time spent on counselling and coordination of care    Note: This dictation was prepared with Dragon dictation along with smaller phrase technology. Any transcriptional errors that result from this process are unintentional.  Fritzi Mandes M.D on 08/04/2017 at 11:18 AM  Between 7am to 6pm - Pager - [phone]  After 6pm  go to [email or web address] - password [email or web address] [facility]  Office  [phone]  CC: Primary care physician; [name], NPPatient ID: Evan Sullivan, male   DOB: Sep 16, 1970, 47 y.o.   MRN: [id number]

## 2017-08-05 DIAGNOSIS — Z7901 Long term (current) use of anticoagulants: Secondary | ICD-10-CM | POA: Diagnosis not present

## 2017-08-05 DIAGNOSIS — Z9181 History of falling: Secondary | ICD-10-CM | POA: Diagnosis not present

## 2017-08-05 DIAGNOSIS — M109 Gout, unspecified: Secondary | ICD-10-CM | POA: Diagnosis not present

## 2017-08-05 DIAGNOSIS — Z96641 Presence of right artificial hip joint: Secondary | ICD-10-CM | POA: Diagnosis not present

## 2017-08-05 DIAGNOSIS — K279 Peptic ulcer, site unspecified, unspecified as acute or chronic, without hemorrhage or perforation: Secondary | ICD-10-CM | POA: Diagnosis not present

## 2017-08-05 DIAGNOSIS — I10 Essential (primary) hypertension: Secondary | ICD-10-CM | POA: Diagnosis not present

## 2017-08-05 DIAGNOSIS — Z79891 Long term (current) use of opiate analgesic: Secondary | ICD-10-CM | POA: Diagnosis not present

## 2017-08-05 DIAGNOSIS — Z471 Aftercare following joint replacement surgery: Secondary | ICD-10-CM | POA: Diagnosis not present

## 2017-08-07 DIAGNOSIS — Z96641 Presence of right artificial hip joint: Secondary | ICD-10-CM | POA: Diagnosis not present

## 2017-08-07 DIAGNOSIS — Z7901 Long term (current) use of anticoagulants: Secondary | ICD-10-CM | POA: Diagnosis not present

## 2017-08-07 DIAGNOSIS — M109 Gout, unspecified: Secondary | ICD-10-CM | POA: Diagnosis not present

## 2017-08-07 DIAGNOSIS — Z79891 Long term (current) use of opiate analgesic: Secondary | ICD-10-CM | POA: Diagnosis not present

## 2017-08-07 DIAGNOSIS — I10 Essential (primary) hypertension: Secondary | ICD-10-CM | POA: Diagnosis not present

## 2017-08-07 DIAGNOSIS — K279 Peptic ulcer, site unspecified, unspecified as acute or chronic, without hemorrhage or perforation: Secondary | ICD-10-CM | POA: Diagnosis not present

## 2017-08-07 DIAGNOSIS — Z9181 History of falling: Secondary | ICD-10-CM | POA: Diagnosis not present

## 2017-08-07 DIAGNOSIS — Z471 Aftercare following joint replacement surgery: Secondary | ICD-10-CM | POA: Diagnosis not present

## 2017-08-09 DIAGNOSIS — Z96641 Presence of right artificial hip joint: Secondary | ICD-10-CM | POA: Diagnosis not present

## 2017-08-09 DIAGNOSIS — I10 Essential (primary) hypertension: Secondary | ICD-10-CM | POA: Diagnosis not present

## 2017-08-09 DIAGNOSIS — K279 Peptic ulcer, site unspecified, unspecified as acute or chronic, without hemorrhage or perforation: Secondary | ICD-10-CM | POA: Diagnosis not present

## 2017-08-09 DIAGNOSIS — Z7901 Long term (current) use of anticoagulants: Secondary | ICD-10-CM | POA: Diagnosis not present

## 2017-08-09 DIAGNOSIS — Z471 Aftercare following joint replacement surgery: Secondary | ICD-10-CM | POA: Diagnosis not present

## 2017-08-09 DIAGNOSIS — Z9181 History of falling: Secondary | ICD-10-CM | POA: Diagnosis not present

## 2017-08-09 DIAGNOSIS — M109 Gout, unspecified: Secondary | ICD-10-CM | POA: Diagnosis not present

## 2017-08-09 DIAGNOSIS — Z79891 Long term (current) use of opiate analgesic: Secondary | ICD-10-CM | POA: Diagnosis not present

## 2017-08-10 ENCOUNTER — Telehealth: Payer: Self-pay

## 2017-08-10 NOTE — Telephone Encounter (Signed)
EMMI Follow-up: Noted on the report that patient had other questions.  I called and talked with Evan Sullivan and he said he was just about out about of the Oxycodone and I encouraged him to follow-up with his PCP.  Also, asked if home health nursing would be coming out to change his dressing since his follow-up appointments aren't until 09/14/17.  I checked with the care manager and she said hospital provided him with 2 dressing changes and he was suppose to change himself. Said PT was going well and they would be back on Wednesday.  No other needs noted.

## 2017-08-11 DIAGNOSIS — Z96641 Presence of right artificial hip joint: Secondary | ICD-10-CM | POA: Diagnosis not present

## 2017-08-11 DIAGNOSIS — Z471 Aftercare following joint replacement surgery: Secondary | ICD-10-CM | POA: Diagnosis not present

## 2017-08-11 DIAGNOSIS — Z79891 Long term (current) use of opiate analgesic: Secondary | ICD-10-CM | POA: Diagnosis not present

## 2017-08-11 DIAGNOSIS — M109 Gout, unspecified: Secondary | ICD-10-CM | POA: Diagnosis not present

## 2017-08-11 DIAGNOSIS — I10 Essential (primary) hypertension: Secondary | ICD-10-CM | POA: Diagnosis not present

## 2017-08-11 DIAGNOSIS — K279 Peptic ulcer, site unspecified, unspecified as acute or chronic, without hemorrhage or perforation: Secondary | ICD-10-CM | POA: Diagnosis not present

## 2017-08-11 DIAGNOSIS — Z9181 History of falling: Secondary | ICD-10-CM | POA: Diagnosis not present

## 2017-08-11 DIAGNOSIS — Z7901 Long term (current) use of anticoagulants: Secondary | ICD-10-CM | POA: Diagnosis not present

## 2017-08-13 DIAGNOSIS — Z96641 Presence of right artificial hip joint: Secondary | ICD-10-CM | POA: Diagnosis not present

## 2017-08-13 DIAGNOSIS — K279 Peptic ulcer, site unspecified, unspecified as acute or chronic, without hemorrhage or perforation: Secondary | ICD-10-CM | POA: Diagnosis not present

## 2017-08-13 DIAGNOSIS — M109 Gout, unspecified: Secondary | ICD-10-CM | POA: Diagnosis not present

## 2017-08-13 DIAGNOSIS — Z9181 History of falling: Secondary | ICD-10-CM | POA: Diagnosis not present

## 2017-08-13 DIAGNOSIS — Z471 Aftercare following joint replacement surgery: Secondary | ICD-10-CM | POA: Diagnosis not present

## 2017-08-13 DIAGNOSIS — Z79891 Long term (current) use of opiate analgesic: Secondary | ICD-10-CM | POA: Diagnosis not present

## 2017-08-13 DIAGNOSIS — I10 Essential (primary) hypertension: Secondary | ICD-10-CM | POA: Diagnosis not present

## 2017-08-13 DIAGNOSIS — Z7901 Long term (current) use of anticoagulants: Secondary | ICD-10-CM | POA: Diagnosis not present

## 2017-08-16 DIAGNOSIS — Z96641 Presence of right artificial hip joint: Secondary | ICD-10-CM | POA: Diagnosis not present

## 2017-08-16 DIAGNOSIS — K279 Peptic ulcer, site unspecified, unspecified as acute or chronic, without hemorrhage or perforation: Secondary | ICD-10-CM | POA: Diagnosis not present

## 2017-08-16 DIAGNOSIS — Z9181 History of falling: Secondary | ICD-10-CM | POA: Diagnosis not present

## 2017-08-16 DIAGNOSIS — Z79891 Long term (current) use of opiate analgesic: Secondary | ICD-10-CM | POA: Diagnosis not present

## 2017-08-16 DIAGNOSIS — Z471 Aftercare following joint replacement surgery: Secondary | ICD-10-CM | POA: Diagnosis not present

## 2017-08-16 DIAGNOSIS — I10 Essential (primary) hypertension: Secondary | ICD-10-CM | POA: Diagnosis not present

## 2017-08-16 DIAGNOSIS — M109 Gout, unspecified: Secondary | ICD-10-CM | POA: Diagnosis not present

## 2017-08-16 DIAGNOSIS — Z7901 Long term (current) use of anticoagulants: Secondary | ICD-10-CM | POA: Diagnosis not present

## 2017-08-18 DIAGNOSIS — Z7901 Long term (current) use of anticoagulants: Secondary | ICD-10-CM | POA: Diagnosis not present

## 2017-08-18 DIAGNOSIS — Z9181 History of falling: Secondary | ICD-10-CM | POA: Diagnosis not present

## 2017-08-18 DIAGNOSIS — I10 Essential (primary) hypertension: Secondary | ICD-10-CM | POA: Diagnosis not present

## 2017-08-18 DIAGNOSIS — M109 Gout, unspecified: Secondary | ICD-10-CM | POA: Diagnosis not present

## 2017-08-18 DIAGNOSIS — Z79891 Long term (current) use of opiate analgesic: Secondary | ICD-10-CM | POA: Diagnosis not present

## 2017-08-18 DIAGNOSIS — K279 Peptic ulcer, site unspecified, unspecified as acute or chronic, without hemorrhage or perforation: Secondary | ICD-10-CM | POA: Diagnosis not present

## 2017-08-18 DIAGNOSIS — Z96641 Presence of right artificial hip joint: Secondary | ICD-10-CM | POA: Diagnosis not present

## 2017-08-18 DIAGNOSIS — Z471 Aftercare following joint replacement surgery: Secondary | ICD-10-CM | POA: Diagnosis not present

## 2017-08-20 DIAGNOSIS — K279 Peptic ulcer, site unspecified, unspecified as acute or chronic, without hemorrhage or perforation: Secondary | ICD-10-CM | POA: Diagnosis not present

## 2017-08-20 DIAGNOSIS — M109 Gout, unspecified: Secondary | ICD-10-CM | POA: Diagnosis not present

## 2017-08-20 DIAGNOSIS — Z96641 Presence of right artificial hip joint: Secondary | ICD-10-CM | POA: Diagnosis not present

## 2017-08-20 DIAGNOSIS — Z471 Aftercare following joint replacement surgery: Secondary | ICD-10-CM | POA: Diagnosis not present

## 2017-08-20 DIAGNOSIS — Z9181 History of falling: Secondary | ICD-10-CM | POA: Diagnosis not present

## 2017-08-20 DIAGNOSIS — Z7901 Long term (current) use of anticoagulants: Secondary | ICD-10-CM | POA: Diagnosis not present

## 2017-08-20 DIAGNOSIS — I10 Essential (primary) hypertension: Secondary | ICD-10-CM | POA: Diagnosis not present

## 2017-08-20 DIAGNOSIS — Z79891 Long term (current) use of opiate analgesic: Secondary | ICD-10-CM | POA: Diagnosis not present

## 2017-08-27 ENCOUNTER — Other Ambulatory Visit: Payer: Self-pay | Admitting: Nurse Practitioner

## 2017-08-27 MED ORDER — AMLODIPINE BESYLATE 10 MG PO TABS: 10.0000 mg | ORAL_TABLET | Freq: Every day | ORAL | 3 refills | Status: DC

## 2017-09-13 DIAGNOSIS — Z471 Aftercare following joint replacement surgery: Secondary | ICD-10-CM | POA: Diagnosis not present

## 2017-09-14 ENCOUNTER — Ambulatory Visit: Payer: Self-pay | Admitting: Nurse Practitioner

## 2017-09-14 DIAGNOSIS — Z96641 Presence of right artificial hip joint: Secondary | ICD-10-CM | POA: Diagnosis not present

## 2017-09-15 ENCOUNTER — Ambulatory Visit: Payer: BLUE CROSS/BLUE SHIELD | Admitting: Nurse Practitioner

## 2017-09-15 ENCOUNTER — Encounter: Payer: Self-pay | Admitting: Nurse Practitioner

## 2017-09-15 VITALS — BP 140/99 | HR 68 | Resp 16 | Ht 72.0 in | Wt 207.0 lb

## 2017-09-15 DIAGNOSIS — M87051 Idiopathic aseptic necrosis of right femur: Secondary | ICD-10-CM

## 2017-09-15 DIAGNOSIS — K219 Gastro-esophageal reflux disease without esophagitis: Secondary | ICD-10-CM | POA: Diagnosis not present

## 2017-09-15 DIAGNOSIS — I10 Essential (primary) hypertension: Secondary | ICD-10-CM | POA: Diagnosis not present

## 2017-09-15 MED ORDER — IBUPROFEN 800 MG PO TABS: 800.0000 mg | ORAL_TABLET | Freq: Three times a day (TID) | ORAL | 3 refills | Status: DC | PRN

## 2017-09-15 MED ORDER — PANTOPRAZOLE SODIUM 40 MG PO TBEC: 40.0000 mg | DELAYED_RELEASE_TABLET | Freq: Every day | ORAL | 5 refills | Status: DC | PRN

## 2017-09-15 MED ORDER — AMLODIPINE BESYLATE 10 MG PO TABS: 10.0000 mg | ORAL_TABLET | Freq: Every day | ORAL | 5 refills | Status: DC

## 2017-09-15 NOTE — Progress Notes (Signed)
[facility] [place], [place] [address]  Internal MEDICINE  Office Visit Note  Patient Name: Evan Sullivan  [id number]  [id number]  Date of Service: 09/15/2017  Chief Complaint  Patient presents with  . Hypertension    6wk follow up    The patient does have history of hypertension. Was started on amlodipine 5mg  prior to hip replacement surgery.  Was increased to 10mg  due to resistant bp. Had hip replacement on July 1. Doing very well. Will go back to work September 3. Was discharged from hospital with 10mg  tablets, but ran out. Is currently taking two of the 5mg  tablets.  He would like to have trial of NSAID to help control pain and inflammation in hip and knee after surgery. He has had a ruptured peptic ulcer in the past due to over consumption of NSAIDs in the past.       Current Medication: Outpatient Encounter Medications as of 09/15/2017  Medication Sig  . acetaminophen (TYLENOL) 500 MG tablet Take 1,000 mg by mouth daily as needed for moderate pain.  Marland Kitchen amLODipine (NORVASC) 10 MG tablet Take 1 tablet (10 mg total) by mouth daily.  Marland Kitchen atenolol (TENORMIN) 100 MG tablet Take 1 tablet (100 mg total) by mouth daily.  . hydrochlorothiazide (HYDRODIURIL) 25 MG tablet Take 1 tablet (25 mg total) by mouth daily.  . hydrocortisone cream 1 % Apply 1 application topically daily as needed (rash).  Marland Kitchen oxyCODONE (OXY IR/ROXICODONE) 5 MG immediate release tablet Take 1 tablet (5 mg total) by mouth every 4 (four) hours as needed for moderate pain (pain score 4-6).  . [DISCONTINUED] amLODipine (NORVASC) 10 MG tablet Take 1 tablet (10 mg total) by mouth daily.  . colchicine (COLCRYS) 0.6 MG tablet Take 0.6 mg by mouth daily.  . Colchicine (MITIGARE) 0.6 MG CAPS Take 1 capsule by mouth daily for 1 dose. (Patient taking differently: Take 1 capsule by mouth 2 (two) times daily as needed (gout). )  . enoxaparin (LOVENOX) 40 MG/0.4ML injection Inject 0.4 mLs (40 mg total) into the  skin daily for 14 days.  Marland Kitchen ibuprofen (IBU) 800 MG tablet Take 1 tablet (800 mg total) by mouth every 8 (eight) hours as needed for moderate pain.  . pantoprazole (PROTONIX) 40 MG tablet Take 1 tablet (40 mg total) by mouth daily as needed (acid reflux).  . [DISCONTINUED] pantoprazole (PROTONIX) 40 MG tablet Take 40 mg by mouth daily as needed (acid reflux).    No facility-administered encounter medications on file as of 09/15/2017.     Surgical History: Past Surgical History:  Procedure Laterality Date  . COLONOSCOPY    . KNEE ARTHROSCOPY, MEDIAL PATELLO FEMORAL LIGAMENT REPAIR Bilateral 2004,2000  . TOTAL HIP ARTHROPLASTY Right 08/02/2017   Procedure: TOTAL HIP ARTHROPLASTY;  Surgeon: Dereck Leep, MD;  Location: [facility] ORS;  Service: Orthopedics;  Laterality: Right;    Medical History: Past Medical History:  Diagnosis Date  . Arthritis   . Avascular necrosis of femoral head, right ([place]) 2019  . GERD (gastroesophageal reflux disease)   . Gout   . Hypertension   . Peptic ulcer disease     Family History: Family History  Problem Relation Age of Onset  . Hypertension Mother   . Hypertension Father   . Colon cancer Father     Social History   Socioeconomic History  . Marital status: Single    Spouse name: Not on file  . Number of children: Not on file  .  Years of education: Not on file  . Highest education level: Not on file  Occupational History  . Not on file  Social Needs  . Financial resource strain: Not on file  . Food insecurity:    Worry: Not on file    Inability: Not on file  . Transportation needs:    Medical: Not on file    Non-medical: Not on file  Tobacco Use  . Smoking status: Never Smoker  . Smokeless tobacco: Never Used  Substance and Sexual Activity  . Alcohol use: Yes    Alcohol/week: 6.0 standard drinks    Types: 6 Cans of beer per week    Comment: 2 cans of beer most days  . Drug use: Yes    Types: Marijuana  . Sexual activity: Not on file   Lifestyle  . Physical activity:    Days per week: Not on file    Minutes per session: Not on file  . Stress: Not on file  Relationships  . Social connections:    Talks on phone: Not on file    Gets together: Not on file    Attends religious service: Not on file    Active member of club or organization: Not on file    Attends meetings of clubs or organizations: Not on file    Relationship status: Not on file  . Intimate partner violence:    Fear of current or ex partner: Not on file    Emotionally abused: Not on file    Physically abused: Not on file    Forced sexual activity: Not on file  Other Topics Concern  . Not on file  Social History Narrative  . Not on file      Review of Systems  Constitutional: Negative for activity change, chills, fatigue and unexpected weight change.  HENT: Negative for congestion, postnasal drip, rhinorrhea, sneezing and sore throat.   Eyes: Negative for redness.  Respiratory: Positive for shortness of breath. Negative for cough and chest tightness.   Cardiovascular: Negative for chest pain and palpitations.       Mild elevated bp today.  Gastrointestinal: Negative for abdominal pain, constipation, diarrhea, nausea and vomiting.  Endocrine: Negative for cold intolerance, heat intolerance, polyphagia and polyuria.  Genitourinary: Negative for dysuria and frequency.  Musculoskeletal: Positive for arthralgias and myalgias. Negative for back pain, joint swelling and neck pain.       Right hip replacement August 02, 2017  Skin: Negative for rash.  Allergic/Immunologic: Negative for environmental allergies.  Neurological: Positive for headaches. Negative for tremors and numbness.  Hematological: Negative for adenopathy. Does not bruise/bleed easily.  Psychiatric/Behavioral: Negative for behavioral problems (Depression), sleep disturbance and suicidal ideas. The patient is not nervous/anxious.     Today's Vitals   09/15/17 0922  BP: (!) 140/99   Pulse: 68  Resp: 16  SpO2: 100%  Weight: 207 lb (93.9 kg)  Height: 6' (1.829 m)    Physical Exam  Constitutional: He is oriented to person, place, and time. He appears well-developed and well-nourished. No distress.  HENT:  Head: Normocephalic and atraumatic.  Nose: Nose normal.  Mouth/Throat: Oropharynx is clear and moist. No oropharyngeal exudate.  Eyes: Pupils are equal, round, and reactive to light. Conjunctivae and EOM are normal.  Neck: Normal range of motion. Neck supple. No JVD present. No tracheal deviation present. No thyromegaly present.  Cardiovascular: Normal rate, regular rhythm and normal heart sounds. Exam reveals no gallop and no friction rub.  No murmur heard.  Pulmonary/Chest: Effort normal. No respiratory distress. He has no wheezes. He has no rales. He exhibits no tenderness.  Abdominal: Soft. Bowel sounds are normal. There is no tenderness.  Musculoskeletal: Normal range of motion.  Well healing posterior right hip surgical scar. Patient c/o little pain. Able to walk without limping. Does have small inflammation and pain in right knee. No reduction in ROM or strength.   Lymphadenopathy:    He has no cervical adenopathy.  Neurological: He is alert and oriented to person, place, and time. No cranial nerve deficit.  Skin: Skin is warm and dry. He is not diaphoretic.  Psychiatric: He has a normal mood and affect. His behavior is normal. Judgment and thought content normal.  Nursing note and vitals reviewed.  Assessment/Plan: 1. Essential hypertension Much better than previous visits. Continue amlodipine 10mg  daily.  - amLODipine (NORVASC) 10 MG tablet; Take 1 tablet (10 mg total) by mouth daily.  Dispense: 30 tablet; Refill: 5  2. Avascular necrosis of bone of hip, right ([place]) Did add ibuprofen 800mg  up to TID as needed for pain/inflammation, ensured that patient takes pantoprazole any day he takes ibuprofen to protect lining of his stomach. He has agreed to this  plan.  - ibuprofen (IBU) 800 MG tablet; Take 1 tablet (800 mg total) by mouth every 8 (eight) hours as needed for moderate pain.  Dispense: 90 tablet; Refill: 3  3. Gastroesophageal reflux disease without esophagitis Added back pantoprazole. Patient to take this any time he takes ibuprofen to protenct stomach lining.  - pantoprazole (PROTONIX) 40 MG tablet; Take 1 tablet (40 mg total) by mouth daily as needed (acid reflux).  Dispense: 30 tablet; Refill: 5   General Counseling: Eulas verbalizes understanding of the findings of todays visit and agrees with plan of treatment. I have discussed any further diagnostic evaluation that may be needed or ordered today. We also reviewed his medications today. he has been encouraged to call the office with any questions or concerns that should arise related to todays visit.  Hypertension Counseling:   The following hypertensive lifestyle modification were recommended and discussed:  1. Limiting alcohol intake to less than 1 oz/day of ethanol:(24 oz of beer or 8 oz of wine or 2 oz of 100-proof whiskey). 2. Take baby ASA 81 mg daily. 3. Importance of regular aerobic exercise and losing weight. 4. Reduce dietary saturated fat and cholesterol intake for overall cardiovascular health. 5. Maintaining adequate dietary potassium, calcium, and magnesium intake. 6. Regular monitoring of the blood pressure. 7. Reduce sodium intake to less than 100 mmol/day (less than 2.3 gm of sodium or less than 6 gm of sodium choride)   This patient was seen by [place] with Dr Lavera Guise as a part of collaborative care agreement    Meds ordered this encounter  Medications  . amLODipine (NORVASC) 10 MG tablet    Sig: Take 1 tablet (10 mg total) by mouth daily.    Dispense:  30 tablet    Refill:  5    Order Specific Question:   Supervising Provider    Answer:   Lavera Guise [[id number]]  . pantoprazole (PROTONIX) 40 MG tablet    Sig: Take 1 tablet (40 mg  total) by mouth daily as needed (acid reflux).    Dispense:  30 tablet    Refill:  5    Order Specific Question:   Supervising Provider    Answer:   Lavera Guise [[id number]]  . ibuprofen (  IBU) 800 MG tablet    Sig: Take 1 tablet (800 mg total) by mouth every 8 (eight) hours as needed for moderate pain.    Dispense:  90 tablet    Refill:  3    Order Specific Question:   Supervising Provider    Answer:   Lavera Guise [[id number]]    Time spent: 70 Minutes      Dr Lavera Guise Internal medicine

## 2017-10-14 ENCOUNTER — Other Ambulatory Visit: Payer: Self-pay

## 2017-10-14 ENCOUNTER — Other Ambulatory Visit: Payer: Self-pay | Admitting: Nurse Practitioner

## 2017-10-14 MED ORDER — ATENOLOL 100 MG PO TABS: 100.0000 mg | ORAL_TABLET | Freq: Every day | ORAL | 1 refills | Status: DC

## 2017-10-14 MED ORDER — HYDROCHLOROTHIAZIDE 25 MG PO TABS: 25.0000 mg | ORAL_TABLET | Freq: Every day | ORAL | 2 refills | Status: DC

## 2018-01-20 ENCOUNTER — Other Ambulatory Visit: Payer: Self-pay | Admitting: Nurse Practitioner

## 2018-01-20 DIAGNOSIS — M87051 Idiopathic aseptic necrosis of right femur: Secondary | ICD-10-CM

## 2018-01-20 MED ORDER — IBUPROFEN 800 MG PO TABS: 800.0000 mg | ORAL_TABLET | Freq: Three times a day (TID) | ORAL | 3 refills | Status: DC | PRN

## 2018-01-20 NOTE — Progress Notes (Signed)
Refilled ibuprofen to take only as needed for pain. Discuss frequency with him at next visit.

## 2018-03-14 ENCOUNTER — Other Ambulatory Visit: Payer: Self-pay

## 2018-03-14 DIAGNOSIS — I10 Essential (primary) hypertension: Secondary | ICD-10-CM

## 2018-03-14 MED ORDER — AMLODIPINE BESYLATE 10 MG PO TABS: 10.0000 mg | ORAL_TABLET | Freq: Every day | ORAL | 5 refills | Status: DC

## 2018-03-15 ENCOUNTER — Other Ambulatory Visit: Payer: Self-pay

## 2018-03-15 ENCOUNTER — Encounter: Payer: Self-pay | Admitting: Adult Health

## 2018-03-15 ENCOUNTER — Ambulatory Visit: Payer: BLUE CROSS/BLUE SHIELD | Admitting: Adult Health

## 2018-03-15 VITALS — BP 152/96 | HR 72 | Resp 16 | Ht 72.0 in | Wt 222.0 lb

## 2018-03-15 DIAGNOSIS — I10 Essential (primary) hypertension: Secondary | ICD-10-CM | POA: Diagnosis not present

## 2018-03-15 DIAGNOSIS — Z96641 Presence of right artificial hip joint: Secondary | ICD-10-CM

## 2018-03-15 DIAGNOSIS — K219 Gastro-esophageal reflux disease without esophagitis: Secondary | ICD-10-CM

## 2018-03-15 MED ORDER — ATENOLOL 100 MG PO TABS: 100.0000 mg | ORAL_TABLET | Freq: Every day | ORAL | 1 refills | Status: DC

## 2018-03-15 MED ORDER — HYDROCHLOROTHIAZIDE 25 MG PO TABS: 25.0000 mg | ORAL_TABLET | Freq: Every day | ORAL | 2 refills | Status: DC

## 2018-03-15 NOTE — Progress Notes (Signed)
[facility] [place], [place] [address]  Internal MEDICINE  Office Visit Note  Patient Name: Evan Sullivan  [id number]  [id number]  Date of Service: 05/18/2018  Chief Complaint  Patient presents with  . Hypertension  . Gastroesophageal Reflux    HPI  Pt is here for follow up on HTN and GERD.  His BP is slightly elevated today. However once in room, it was rechecked and found to be better, 155/88. He Denies Chest pain, Shortness of breath, palpitations, headache, or blurred vision. He denies any symptoms of his Jerrye Bushy.  Overall is doing well.    Current Medication: Outpatient Encounter Medications as of 03/15/2018  Medication Sig  . acetaminophen (TYLENOL) 500 MG tablet Take 1,000 mg by mouth daily as needed for moderate pain.  Marland Kitchen amLODipine (NORVASC) 10 MG tablet Take 1 tablet (10 mg total) by mouth daily.  Marland Kitchen atenolol (TENORMIN) 100 MG tablet Take 1 tablet (100 mg total) by mouth daily.  . colchicine (COLCRYS) 0.6 MG tablet Take 0.6 mg by mouth daily.  . hydrochlorothiazide (HYDRODIURIL) 25 MG tablet Take 1 tablet (25 mg total) by mouth daily.  . hydrocortisone cream 1 % Apply 1 application topically daily as needed (rash).  Marland Kitchen ibuprofen (IBU) 800 MG tablet Take 1 tablet (800 mg total) by mouth every 8 (eight) hours as needed for moderate pain.  . pantoprazole (PROTONIX) 40 MG tablet Take 1 tablet (40 mg total) by mouth daily as needed (acid reflux).  . [DISCONTINUED] atenolol (TENORMIN) 100 MG tablet Take 1 tablet (100 mg total) by mouth daily.  . [DISCONTINUED] hydrochlorothiazide (HYDRODIURIL) 25 MG tablet Take 1 tablet (25 mg total) by mouth daily.  . Colchicine (MITIGARE) 0.6 MG CAPS Take 1 capsule by mouth daily for 1 dose. (Patient taking differently: Take 1 capsule by mouth 2 (two) times daily as needed (gout). )  . enoxaparin (LOVENOX) 40 MG/0.4ML injection Inject 0.4 mLs (40 mg total) into the skin daily for 14 days.  . [DISCONTINUED] oxyCODONE (OXY  IR/ROXICODONE) 5 MG immediate release tablet Take 1 tablet (5 mg total) by mouth every 4 (four) hours as needed for moderate pain (pain score 4-6). (Patient not taking: Reported on 03/15/2018)   No facility-administered encounter medications on file as of 03/15/2018.     Surgical History: Past Surgical History:  Procedure Laterality Date  . COLONOSCOPY    . KNEE ARTHROSCOPY, MEDIAL PATELLO FEMORAL LIGAMENT REPAIR Bilateral 2004,2000  . TOTAL HIP ARTHROPLASTY Right 08/02/2017   Procedure: TOTAL HIP ARTHROPLASTY;  Surgeon: Dereck Leep, MD;  Location: [facility] ORS;  Service: Orthopedics;  Laterality: Right;    Medical History: Past Medical History:  Diagnosis Date  . Arthritis   . Avascular necrosis of femoral head, right ([place]) 2019  . GERD (gastroesophageal reflux disease)   . Gout   . Hypertension   . Peptic ulcer disease     Family History: Family History  Problem Relation Age of Onset  . Hypertension Mother   . Hypertension Father   . Colon cancer Father     Social History   Socioeconomic History  . Marital status: Single    Spouse name: Not on file  . Number of children: Not on file  . Years of education: Not on file  . Highest education level: Not on file  Occupational History  . Not on file  Social Needs  . Financial resource strain: Not on file  . Food insecurity:    Worry: Not on file  Inability: Not on file  . Transportation needs:    Medical: Not on file    Non-medical: Not on file  Tobacco Use  . Smoking status: Never Smoker  . Smokeless tobacco: Never Used  Substance and Sexual Activity  . Alcohol use: Yes    Alcohol/week: 6.0 standard drinks    Types: 6 Cans of beer per week    Comment: 2 cans of beer most days  . Drug use: Yes    Types: Marijuana  . Sexual activity: Not on file  Lifestyle  . Physical activity:    Days per week: Not on file    Minutes per session: Not on file  . Stress: Not on file  Relationships  . Social connections:     Talks on phone: Not on file    Gets together: Not on file    Attends religious service: Not on file    Active member of club or organization: Not on file    Attends meetings of clubs or organizations: Not on file    Relationship status: Not on file  . Intimate partner violence:    Fear of current or ex partner: Not on file    Emotionally abused: Not on file    Physically abused: Not on file    Forced sexual activity: Not on file  Other Topics Concern  . Not on file  Social History Narrative  . Not on file      Review of Systems  Constitutional: Negative.  Negative for chills, fatigue and unexpected weight change.  HENT: Negative.  Negative for congestion, rhinorrhea, sneezing and sore throat.   Eyes: Negative for redness.  Respiratory: Negative.  Negative for cough, chest tightness and shortness of breath.   Cardiovascular: Negative.  Negative for chest pain and palpitations.  Gastrointestinal: Negative.  Negative for abdominal pain, constipation, diarrhea, nausea and vomiting.  Endocrine: Negative.   Genitourinary: Negative.  Negative for dysuria and frequency.  Musculoskeletal: Negative.  Negative for arthralgias, back pain, joint swelling and neck pain.  Skin: Negative.  Negative for rash.  Allergic/Immunologic: Negative.   Neurological: Negative.  Negative for tremors and numbness.  Hematological: Negative for adenopathy. Does not bruise/bleed easily.  Psychiatric/Behavioral: Negative.  Negative for behavioral problems, sleep disturbance and suicidal ideas. The patient is not nervous/anxious.     Vital Signs: BP (!) 152/96   Pulse 72   Resp 16   Ht 6' (1.829 m)   Wt 222 lb (100.7 kg)   SpO2 96%   BMI 30.11 kg/m    Physical Exam Vitals signs and nursing note reviewed.  Constitutional:      General: He is not in acute distress.    Appearance: He is well-developed. He is not diaphoretic.  HENT:     Head: Normocephalic and atraumatic.     Mouth/Throat:      Pharynx: No oropharyngeal exudate.  Eyes:     Pupils: Pupils are equal, round, and reactive to light.  Neck:     Musculoskeletal: Normal range of motion and neck supple.     Thyroid: No thyromegaly.     Vascular: No JVD.     Trachea: No tracheal deviation.  Cardiovascular:     Rate and Rhythm: Normal rate and regular rhythm.     Heart sounds: Normal heart sounds. No murmur. No friction rub. No gallop.   Pulmonary:     Effort: Pulmonary effort is normal. No respiratory distress.     Breath sounds: Normal breath sounds. No  wheezing or rales.  Chest:     Chest wall: No tenderness.  Abdominal:     Palpations: Abdomen is soft.     Tenderness: There is no abdominal tenderness. There is no guarding.  Musculoskeletal: Normal range of motion.  Lymphadenopathy:     Cervical: No cervical adenopathy.  Skin:    General: Skin is warm and dry.  Neurological:     Mental Status: He is alert and oriented to person, place, and time.     Cranial Nerves: No cranial nerve deficit.  Psychiatric:        Behavior: Behavior normal.        Thought Content: Thought content normal.        Judgment: Judgment normal.    Assessment/Plan: bp 155/88 1. Essential hypertension Repeat BP 155/88.  Refilled patients atenolol and HCTZ at this visit.  Continue present thearpy.  Return to clinic if  - atenolol (TENORMIN) 100 MG tablet; Take 1 tablet (100 mg total) by mouth daily.  Dispense: 90 tablet; Refill: 1 - hydrochlorothiazide (HYDRODIURIL) 25 MG tablet; Take 1 tablet (25 mg total) by mouth daily.  Dispense: 90 tablet; Refill: 2  2. Gastroesophageal reflux disease without esophagitis Stable, continue present management.   3. Status post total replacement of right hip Doing well, continue to follow up a scheduled with ortho.  General Counseling: Evan Sullivan verbalizes understanding of the findings of todays visit and agrees with plan of treatment. I have discussed any further diagnostic evaluation that may be  needed or ordered today. We also reviewed his medications today. he has been encouraged to call the office with any questions or concerns that should arise related to todays visit.    No orders of the defined types were placed in this encounter.   Meds ordered this encounter  Medications  . atenolol (TENORMIN) 100 MG tablet    Sig: Take 1 tablet (100 mg total) by mouth daily.    Dispense:  90 tablet    Refill:  1  . hydrochlorothiazide (HYDRODIURIL) 25 MG tablet    Sig: Take 1 tablet (25 mg total) by mouth daily.    Dispense:  90 tablet    Refill:  2    Time spent: 25 Minutes   This patient was seen by Orson Gear AGNP-C in Collaboration with Dr Lavera Guise as a part of collaborative care agreement     Kendell Bane AGNP-C Internal medicine

## 2018-03-15 NOTE — Patient Instructions (Signed)

## 2018-05-18 ENCOUNTER — Encounter: Payer: Self-pay | Admitting: Adult Health

## 2018-09-05 ENCOUNTER — Other Ambulatory Visit: Payer: Self-pay

## 2018-09-05 DIAGNOSIS — I10 Essential (primary) hypertension: Secondary | ICD-10-CM

## 2018-09-05 MED ORDER — AMLODIPINE BESYLATE 10 MG PO TABS: 10.0000 mg | ORAL_TABLET | Freq: Every day | ORAL | 5 refills | Status: DC

## 2018-09-15 ENCOUNTER — Other Ambulatory Visit: Payer: Self-pay

## 2018-09-15 ENCOUNTER — Encounter: Payer: Self-pay | Admitting: Adult Health

## 2018-09-15 ENCOUNTER — Ambulatory Visit: Payer: BC Managed Care – PPO | Admitting: Adult Health

## 2018-09-15 VITALS — BP 149/100 | HR 80 | Resp 16 | Ht 73.0 in | Wt 236.0 lb

## 2018-09-15 DIAGNOSIS — K219 Gastro-esophageal reflux disease without esophagitis: Secondary | ICD-10-CM

## 2018-09-15 DIAGNOSIS — I10 Essential (primary) hypertension: Secondary | ICD-10-CM | POA: Diagnosis not present

## 2018-09-15 NOTE — Progress Notes (Signed)
[facility] [place], [place] [address]  Internal MEDICINE  Office Visit Note  Patient Name: Evan Sullivan  [id number]  [id number]  Date of Service: 09/15/2018  Chief Complaint  Patient presents with  . Medical Management of Chronic Issues    6 month follow up  . Hypertension  . Gastroesophageal Reflux    HPI Pt is here for follow up on HTN and GERD. Pt reports overall he has been doing well.  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  He denies chest pain, sob, palpitations or dizziness.  He reports today's elevated reading is normal for him.        Current Medication: Outpatient Encounter Medications as of 09/15/2018  Medication Sig  . acetaminophen (TYLENOL) 500 MG tablet Take 1,000 mg by mouth daily as needed for moderate pain.  Marland Kitchen amLODipine (NORVASC) 10 MG tablet Take 1 tablet (10 mg total) by mouth daily.  Marland Kitchen atenolol (TENORMIN) 100 MG tablet Take 1 tablet (100 mg total) by mouth daily.  . colchicine (COLCRYS) 0.6 MG tablet Take 0.6 mg by mouth daily.  . hydrochlorothiazide (HYDRODIURIL) 25 MG tablet Take 1 tablet (25 mg total) by mouth daily.  . hydrocortisone cream 1 % Apply 1 application topically daily as needed (rash).  Marland Kitchen ibuprofen (IBU) 800 MG tablet Take 1 tablet (800 mg total) by mouth every 8 (eight) hours as needed for moderate pain.  . pantoprazole (PROTONIX) 40 MG tablet Take 1 tablet (40 mg total) by mouth daily as needed (acid reflux).  . Colchicine (MITIGARE) 0.6 MG CAPS Take 1 capsule by mouth daily for 1 dose. (Patient taking differently: Take 1 capsule by mouth 2 (two) times daily as needed (gout). )  . enoxaparin (LOVENOX) 40 MG/0.4ML injection Inject 0.4 mLs (40 mg total) into the skin daily for 14 days.   No facility-administered encounter medications on file as of 09/15/2018.     Surgical History: Past Surgical History:  Procedure Laterality Date  . COLONOSCOPY     . KNEE ARTHROSCOPY, MEDIAL PATELLO FEMORAL LIGAMENT REPAIR Bilateral 2004,2000  . TOTAL HIP ARTHROPLASTY Right 08/02/2017   Procedure: TOTAL HIP ARTHROPLASTY;  Surgeon: Dereck Leep, MD;  Location: [facility] ORS;  Service: Orthopedics;  Laterality: Right;    Medical History: Past Medical History:  Diagnosis Date  . Arthritis   . Avascular necrosis of femoral head, right ([place]) 2019  . GERD (gastroesophageal reflux disease)   . Gout   . Hypertension   . Peptic ulcer disease     Family History: Family History  Problem Relation Age of Onset  . Hypertension Mother   . Hypertension Father   . Colon cancer Father     Social History   Socioeconomic History  . Marital status: Single    Spouse name: Not on file  . Number of children: Not on file  . Years of education: Not on file  . Highest education level: Not on file  Occupational History  . Not on file  Social Needs  . Financial resource strain: Not on file  . Food insecurity    Worry: Not on file    Inability: Not on file  . Transportation needs    Medical: Not on file    Non-medical: Not on file  Tobacco Use  . Smoking status: Never Smoker  . Smokeless tobacco: Never Used  Substance and Sexual Activity  . Alcohol use: Yes  Alcohol/week: 6.0 standard drinks    Types: 6 Cans of beer per week    Comment: 2 cans of beer most days  . Drug use: Yes    Types: Marijuana  . Sexual activity: Not on file  Lifestyle  . Physical activity    Days per week: Not on file    Minutes per session: Not on file  . Stress: Not on file  Relationships  . Social Herbalist on phone: Not on file    Gets together: Not on file    Attends religious service: Not on file    Active member of club or organization: Not on file    Attends meetings of clubs or organizations: Not on file    Relationship status: Not on file  . Intimate partner violence    Fear of current or ex partner: Not on file    Emotionally abused: Not on file     Physically abused: Not on file    Forced sexual activity: Not on file  Other Topics Concern  . Not on file  Social History Narrative  . Not on file      Review of Systems  Constitutional: Negative.  Negative for chills, fatigue and unexpected weight change.  HENT: Negative.  Negative for congestion, rhinorrhea, sneezing and sore throat.   Eyes: Negative for redness.  Respiratory: Negative.  Negative for cough, chest tightness and shortness of breath.   Cardiovascular: Negative.  Negative for chest pain and palpitations.  Gastrointestinal: Negative.  Negative for abdominal pain, constipation, diarrhea, nausea and vomiting.  Endocrine: Negative.   Genitourinary: Negative.  Negative for dysuria and frequency.  Musculoskeletal: Negative.  Negative for arthralgias, back pain, joint swelling and neck pain.  Skin: Negative.  Negative for rash.  Allergic/Immunologic: Negative.   Neurological: Negative.  Negative for tremors and numbness.  Hematological: Negative for adenopathy. Does not bruise/bleed easily.  Psychiatric/Behavioral: Negative.  Negative for behavioral problems, sleep disturbance and suicidal ideas. The patient is not nervous/anxious.     Vital Signs: BP (!) 149/100   Pulse 80   Resp 16   Ht 6\' 1"  (1.854 m)   Wt 236 lb (107 kg)   SpO2 98%   BMI 31.14 kg/m    Physical Exam Vitals signs and nursing note reviewed.  Constitutional:      General: He is not in acute distress.    Appearance: He is well-developed. He is not diaphoretic.  HENT:     Head: Normocephalic and atraumatic.     Mouth/Throat:     Pharynx: No oropharyngeal exudate.  Eyes:     Pupils: Pupils are equal, round, and reactive to light.  Neck:     Musculoskeletal: Normal range of motion and neck supple.     Thyroid: No thyromegaly.     Vascular: No JVD.     Trachea: No tracheal deviation.  Cardiovascular:     Rate and Rhythm: Normal rate and regular rhythm.     Heart sounds: Normal heart  sounds. No murmur. No friction rub. No gallop.   Pulmonary:     Effort: Pulmonary effort is normal. No respiratory distress.     Breath sounds: Normal breath sounds. No wheezing or rales.  Chest:     Chest wall: No tenderness.  Abdominal:     Palpations: Abdomen is soft.     Tenderness: There is no abdominal tenderness. There is no guarding.  Musculoskeletal: Normal range of motion.  Lymphadenopathy:  Cervical: No cervical adenopathy.  Skin:    General: Skin is warm and dry.  Neurological:     Mental Status: He is alert and oriented to person, place, and time.     Cranial Nerves: No cranial nerve deficit.  Psychiatric:        Behavior: Behavior normal.        Thought Content: Thought content normal.        Judgment: Judgment normal.    Assessment/Plan: 1. Essential hypertension Elevated, encouraged patient to continue to take medications and follow up as scheduled.  Would consider hydralazine if he is interested later. He is not at this time. Encouraged him to take his bp at home when he is not working.   2. Gastroesophageal reflux disease without esophagitis Stable, continue present management.   General Counseling: Evan Sullivan verbalizes understanding of the findings of todays visit and agrees with plan of treatment. I have discussed any further diagnostic evaluation that may be needed or ordered today. We also reviewed his medications today. he has been encouraged to call the office with any questions or concerns that should arise related to todays visit.    No orders of the defined types were placed in this encounter.   No orders of the defined types were placed in this encounter.   Time spent: 15 Minutes   This patient was seen by Orson Gear AGNP-C in Collaboration with Dr Lavera Guise as a part of collaborative care agreement     Kendell Bane AGNP-C Internal medicine

## 2018-10-07 ENCOUNTER — Other Ambulatory Visit: Payer: Self-pay | Admitting: Adult Health

## 2018-10-07 DIAGNOSIS — I10 Essential (primary) hypertension: Secondary | ICD-10-CM

## 2018-11-24 ENCOUNTER — Encounter: Payer: BC Managed Care – PPO | Admitting: Nurse Practitioner

## 2018-12-14 ENCOUNTER — Telehealth: Payer: Self-pay

## 2018-12-14 NOTE — Telephone Encounter (Signed)
SCREENED AND CONFIRMED 12-16-18 APPOINTMENT.

## 2018-12-16 ENCOUNTER — Encounter: Payer: Self-pay | Admitting: Nurse Practitioner

## 2018-12-16 ENCOUNTER — Other Ambulatory Visit: Payer: Self-pay

## 2018-12-16 ENCOUNTER — Ambulatory Visit (INDEPENDENT_AMBULATORY_CARE_PROVIDER_SITE_OTHER): Payer: BC Managed Care – PPO | Admitting: Nurse Practitioner

## 2018-12-16 VITALS — BP 143/99 | HR 75 | Temp 98.3°F | Resp 16 | Ht 73.0 in | Wt 227.0 lb

## 2018-12-16 DIAGNOSIS — Z1211 Encounter for screening for malignant neoplasm of colon: Secondary | ICD-10-CM | POA: Diagnosis not present

## 2018-12-16 DIAGNOSIS — Z0001 Encounter for general adult medical examination with abnormal findings: Secondary | ICD-10-CM

## 2018-12-16 DIAGNOSIS — R300 Dysuria: Secondary | ICD-10-CM | POA: Diagnosis not present

## 2018-12-16 DIAGNOSIS — I10 Essential (primary) hypertension: Secondary | ICD-10-CM

## 2018-12-16 DIAGNOSIS — K219 Gastro-esophageal reflux disease without esophagitis: Secondary | ICD-10-CM

## 2018-12-16 DIAGNOSIS — Z800 Family history of malignant neoplasm of digestive organs: Secondary | ICD-10-CM

## 2018-12-16 NOTE — Progress Notes (Signed)
[facility] [place], [place] [address]  Internal MEDICINE  Office Visit Note  Patient Name: Evan Sullivan  [id number]  [id number]  Date of Service: 12/31/2018   Pt is here for routine health maintenance examination   Chief Complaint  Patient presents with  . Annual Exam  . Hypertension     The patient is here for health maintenance exam. His blood pressure is elevated today. He states that he is taking all blood pressure medication as prescribed. He states that he feels well. Denies chest pain, chest pressure, or shortness of breath.  had colonoscopy in 2017 with multiple polyps. Father passed away from colon cancer. He was to repeat colonoscopy in three years. Last done at [facility].   Current Medication: Outpatient Encounter Medications as of 12/16/2018  Medication Sig  . acetaminophen (TYLENOL) 500 MG tablet Take 1,000 mg by mouth daily as needed for moderate pain.  Marland Kitchen amLODipine (NORVASC) 10 MG tablet Take 1 tablet (10 mg total) by mouth daily.  Marland Kitchen atenolol (TENORMIN) 100 MG tablet TAKE 1 TABLET BY MOUTH EVERY DAY  . colchicine (COLCRYS) 0.6 MG tablet Take 0.6 mg by mouth daily.  . hydrochlorothiazide (HYDRODIURIL) 25 MG tablet Take 1 tablet (25 mg total) by mouth daily.  . hydrocortisone cream 1 % Apply 1 application topically daily as needed (rash).  Marland Kitchen ibuprofen (IBU) 800 MG tablet Take 1 tablet (800 mg total) by mouth every 8 (eight) hours as needed for moderate pain.  . pantoprazole (PROTONIX) 40 MG tablet Take 1 tablet (40 mg total) by mouth daily as needed (acid reflux).  . Colchicine (MITIGARE) 0.6 MG CAPS Take 1 capsule by mouth daily for 1 dose. (Patient taking differently: Take 1 capsule by mouth 2 (two) times daily as needed (gout). )  . enoxaparin (LOVENOX) 40 MG/0.4ML injection Inject 0.4 mLs (40 mg total) into the skin daily for 14 days.   No facility-administered encounter medications on file as of 12/16/2018.     Surgical  History: Past Surgical History:  Procedure Laterality Date  . COLONOSCOPY    . KNEE ARTHROSCOPY, MEDIAL PATELLO FEMORAL LIGAMENT REPAIR Bilateral 2004,2000  . TOTAL HIP ARTHROPLASTY Right 08/02/2017   Procedure: TOTAL HIP ARTHROPLASTY;  Surgeon: Dereck Leep, MD;  Location: [facility] ORS;  Service: Orthopedics;  Laterality: Right;    Medical History: Past Medical History:  Diagnosis Date  . Arthritis   . Avascular necrosis of femoral head, right ([place]) 2019  . GERD (gastroesophageal reflux disease)   . Gout   . Hypertension   . Peptic ulcer disease     Family History: Family History  Problem Relation Age of Onset  . Hypertension Mother   . Hypertension Father   . Colon cancer Father       Review of Systems  Constitutional: Negative for chills, fatigue and unexpected weight change.  HENT: Negative for congestion, postnasal drip, rhinorrhea, sneezing and sore throat.   Respiratory: Negative for cough, chest tightness, shortness of breath and wheezing.   Cardiovascular: Negative for chest pain and palpitations.       Elevated blood pressure today  Gastrointestinal: Negative for abdominal pain, constipation, diarrhea, nausea and vomiting.  Endocrine: Negative for cold intolerance, heat intolerance, polydipsia and polyuria.  Genitourinary: Negative for dysuria, frequency, hematuria and urgency.  Musculoskeletal: Negative for arthralgias, back pain, joint swelling and neck pain.  Skin: Negative for rash.  Allergic/Immunologic: Negative for environmental allergies.  Neurological: Negative for dizziness, tremors, numbness and headaches.  Hematological: Negative for adenopathy. Does not bruise/bleed easily.  Psychiatric/Behavioral: Negative for behavioral problems (Depression), sleep disturbance and suicidal ideas. The patient is not nervous/anxious.      Today's Vitals   12/16/18 1440 12/16/18 1531  BP: (!) 147/103 (!) 143/99  Pulse: 77 75  Resp: 16   Temp: 98.3 F (36.8 C)    SpO2: 98% 95%  Weight: 227 lb (103 kg)   Height: 6\' 1"  (1.854 m)    Body mass index is 29.95 kg/m.  Physical Exam Vitals signs and nursing note reviewed.  Constitutional:      General: He is not in acute distress.    Appearance: Normal appearance. He is well-developed. He is not diaphoretic.  HENT:     Head: Normocephalic and atraumatic.     Nose: Nose normal.     Mouth/Throat:     Pharynx: No oropharyngeal exudate.  Eyes:     Extraocular Movements: Extraocular movements intact.     Pupils: Pupils are equal, round, and reactive to light.  Neck:     Musculoskeletal: Normal range of motion and neck supple.     Thyroid: No thyromegaly.     Vascular: No JVD.     Trachea: No tracheal deviation.  Cardiovascular:     Rate and Rhythm: Normal rate and regular rhythm.     Pulses: Normal pulses.     Heart sounds: Normal heart sounds. No murmur. No friction rub. No gallop.   Pulmonary:     Effort: Pulmonary effort is normal. No respiratory distress.     Breath sounds: Normal breath sounds. No wheezing or rales.  Chest:     Chest wall: No tenderness.  Abdominal:     General: Bowel sounds are normal.     Palpations: Abdomen is soft.     Tenderness: There is no abdominal tenderness.  Musculoskeletal: Normal range of motion.  Lymphadenopathy:     Cervical: No cervical adenopathy.  Skin:    General: Skin is warm and dry.  Neurological:     Mental Status: He is alert and oriented to person, place, and time.     Cranial Nerves: No cranial nerve deficit.  Psychiatric:        Mood and Affect: Mood normal.        Behavior: Behavior normal.        Thought Content: Thought content normal.        Judgment: Judgment normal.      LABS: Recent Results (from the past 2160 hour(s))  UA/M w/rflx Culture, Routine     Status: Abnormal   Collection Time: 12/16/18  2:47 PM   Specimen: Urine   URINE  Result Value Ref Range   Specific Gravity, UA 1.017 1.005 - 1.030   pH, UA 6.0 5.0 - 7.5    Color, UA Yellow Yellow   Appearance Ur Clear Clear   Leukocytes,UA Negative Negative   Protein,UA 2+ (A) Negative/Trace   Glucose, UA Negative Negative   Ketones, UA Negative Negative   RBC, UA Negative Negative   Bilirubin, UA Negative Negative   Urobilinogen, Ur 0.2 0.2 - 1.0 mg/dL   Nitrite, UA Negative Negative   Microscopic Examination See below:     Comment: Microscopic was indicated and was performed.   Urinalysis Reflex Comment     Comment: This specimen will not reflex to a Urine Culture.  Microscopic Examination     Status: Abnormal   Collection Time: 12/16/18  2:47 PM   URINE  Result Value  Ref Range   WBC, UA 0-5 0 - 5 /hpf   RBC 0-2 0 - 2 /hpf   Epithelial Cells (non renal) 0-10 0 - 10 /hpf   Casts Present (A) None seen /lpf   Cast Type Hyaline casts N/A   Mucus, UA Present Not Estab.   Bacteria, UA None seen None seen/Few  Comprehensive metabolic panel     Status: Abnormal   Collection Time: 12/23/18 11:25 AM  Result Value Ref Range   Glucose 118 (H) 65 - 99 mg/dL   BUN 15 6 - 24 mg/dL   Creatinine, Ser 1.24 0.76 - 1.27 mg/dL   GFR calc non Af Amer 69 >59 mL/min/1.73   GFR calc Af Amer 80 >59 mL/min/1.73   BUN/Creatinine Ratio 12 9 - 20   Sodium 138 134 - 144 mmol/L   Potassium 3.8 3.5 - 5.2 mmol/L   Chloride 100 96 - 106 mmol/L   CO2 21 20 - 29 mmol/L   Calcium 9.8 8.7 - 10.2 mg/dL   Total Protein 7.2 6.0 - 8.5 g/dL   Albumin 4.0 4.0 - 5.0 g/dL   Globulin, Total 3.2 1.5 - 4.5 g/dL   Albumin/Globulin Ratio 1.3 1.2 - 2.2   Bilirubin Total 0.3 0.0 - 1.2 mg/dL   Alkaline Phosphatase 63 39 - 117 IU/L   AST 35 0 - 40 IU/L   ALT 34 0 - 44 IU/L  CBC     Status: None   Collection Time: 12/23/18 11:25 AM  Result Value Ref Range   WBC 6.3 3.4 - 10.8 x10E3/uL   RBC 4.43 4.14 - 5.80 x10E6/uL   Hemoglobin 14.2 13.0 - 17.7 g/dL   Hematocrit 41.4 37.5 - 51.0 %   MCV 94 79 - 97 fL   MCH 32.1 26.6 - 33.0 pg   MCHC 34.3 31.5 - 35.7 g/dL   RDW 12.6 11.6 - 15.4 %    Platelets 292 150 - 450 x10E3/uL  Lipid Panel w/o Chol/HDL Ratio     Status: Abnormal   Collection Time: 12/23/18 11:25 AM  Result Value Ref Range   Cholesterol, Total 190 100 - 199 mg/dL   Triglycerides 401 (H) 0 - 149 mg/dL   HDL 57 >39 mg/dL   VLDL Cholesterol Cal 63 (H) 5 - 40 mg/dL   LDL Chol Calc (NIH) 70 0 - 99 mg/dL  T4, free     Status: None   Collection Time: 12/23/18 11:25 AM  Result Value Ref Range   Free T4 0.97 0.82 - 1.77 ng/dL  TSH     Status: None   Collection Time: 12/23/18 11:25 AM  Result Value Ref Range   TSH 2.290 0.450 - 4.500 uIU/mL  PSA     Status: None   Collection Time: 12/23/18 11:25 AM  Result Value Ref Range   Prostate Specific Ag, Serum 0.4 0.0 - 4.0 ng/mL    Comment: Roche ECLIA methodology. According to the American Urological Association, Serum PSA should decrease and remain at undetectable levels after radical prostatectomy. The AUA defines biochemical recurrence as an initial PSA value 0.2 ng/mL or greater followed by a subsequent confirmatory PSA value 0.2 ng/mL or greater. Values obtained with different assay methods or kits cannot be used interchangeably. Results cannot be interpreted as absolute evidence of the presence or absence of malignant disease.     Assessment/Plan: 1. Encounter for general adult medical examination with abnormal findings Annual health maintenance exam today  2. Essential hypertension Generally stable. continue bp  medication as prescribed   3. Gastroesophageal reflux disease without esophagitis Continue pantoprazole as prescribed   4. Screening for colon cancer Refer to [facility] for colonoscopy.  - Ambulatory referral to Gastroenterology  5. Family history of colon cancer Refer to [facility] for colonoscopy.  - Ambulatory referral to Gastroenterology  6. Dysuria - UA/M w/rflx Culture, Routine  General Counseling: Minh verbalizes understanding of the findings of todays visit and agrees with plan of  treatment. I have discussed any further diagnostic evaluation that may be needed or ordered today. We also reviewed his medications today. he has been encouraged to call the office with any questions or concerns that should arise related to todays visit.    Counseling:  Hypertension Counseling:   The following hypertensive lifestyle modification were recommended and discussed:  1. Limiting alcohol intake to less than 1 oz/day of ethanol:(24 oz of beer or 8 oz of wine or 2 oz of 100-proof whiskey). 2. Take baby ASA 81 mg daily. 3. Importance of regular aerobic exercise and losing weight. 4. Reduce dietary saturated fat and cholesterol intake for overall cardiovascular health. 5. Maintaining adequate dietary potassium, calcium, and magnesium intake. 6. Regular monitoring of the blood pressure. 7. Reduce sodium intake to less than 100 mmol/day (less than 2.3 gm of sodium or less than 6 gm of sodium choride)   This patient was seen by [place] with Dr Lavera Guise as a part of collaborative care agreement  Orders Placed This Encounter  Procedures  . Microscopic Examination  . UA/M w/rflx Culture, Routine  . Ambulatory referral to Gastroenterology     Time spent: [place], MD  Internal Medicine

## 2018-12-17 LAB — MICROSCOPIC EXAMINATION: Bacteria, UA: NONE SEEN

## 2018-12-17 LAB — UA/M W/RFLX CULTURE, ROUTINE
Bilirubin, UA: NEGATIVE
Glucose, UA: NEGATIVE
Ketones, UA: NEGATIVE
Leukocytes,UA: NEGATIVE
Nitrite, UA: NEGATIVE
RBC, UA: NEGATIVE
Specific Gravity, UA: 1.017 (ref 1.005–1.030)
Urobilinogen, Ur: 0.2 mg/dL (ref 0.2–1.0)
pH, UA: 6 (ref 5.0–7.5)

## 2018-12-23 ENCOUNTER — Other Ambulatory Visit: Payer: Self-pay | Admitting: Nurse Practitioner

## 2018-12-23 DIAGNOSIS — Z125 Encounter for screening for malignant neoplasm of prostate: Secondary | ICD-10-CM | POA: Diagnosis not present

## 2018-12-24 LAB — COMPREHENSIVE METABOLIC PANEL
ALT: 34 IU/L (ref 0–44)
AST: 35 IU/L (ref 0–40)
Albumin/Globulin Ratio: 1.3 (ref 1.2–2.2)
Albumin: 4 g/dL (ref 4.0–5.0)
Alkaline Phosphatase: 63 IU/L (ref 39–117)
BUN/Creatinine Ratio: 12 (ref 9–20)
BUN: 15 mg/dL (ref 6–24)
Bilirubin Total: 0.3 mg/dL (ref 0.0–1.2)
CO2: 21 mmol/L (ref 20–29)
Calcium: 9.8 mg/dL (ref 8.7–10.2)
Chloride: 100 mmol/L (ref 96–106)
Creatinine, Ser: 1.24 mg/dL (ref 0.76–1.27)
GFR calc Af Amer: 80 mL/min/{1.73_m2} (ref >59)
GFR calc non Af Amer: 69 mL/min/{1.73_m2} (ref >59)
Globulin, Total: 3.2 g/dL (ref 1.5–4.5)
Glucose: 118 mg/dL — ABNORMAL HIGH (ref 65–99)
Potassium: 3.8 mmol/L (ref 3.5–5.2)
Sodium: 138 mmol/L (ref 134–144)
Total Protein: 7.2 g/dL (ref 6.0–8.5)

## 2018-12-24 LAB — CBC
Hematocrit: 41.4 % (ref 37.5–51.0)
Hemoglobin: 14.2 g/dL (ref 13.0–17.7)
MCH: 32.1 pg (ref 26.6–33.0)
MCHC: 34.3 g/dL (ref 31.5–35.7)
MCV: 94 fL (ref 79–97)
Platelets: 292 10*3/uL (ref 150–450)
RBC: 4.43 x10E6/uL (ref 4.14–5.80)
RDW: 12.6 % (ref 11.6–15.4)
WBC: 6.3 10*3/uL (ref 3.4–10.8)

## 2018-12-24 LAB — TSH: TSH: 2.29 u[IU]/mL (ref 0.450–4.500)

## 2018-12-24 LAB — LIPID PANEL W/O CHOL/HDL RATIO
Cholesterol, Total: 190 mg/dL (ref 100–199)
HDL: 57 mg/dL (ref >39)
LDL Chol Calc (NIH): 70 mg/dL (ref 0–99)
Triglycerides: 401 mg/dL — ABNORMAL HIGH (ref 0–149)
VLDL Cholesterol Cal: 63 mg/dL — ABNORMAL HIGH (ref 5–40)

## 2018-12-24 LAB — T4, FREE: Free T4: 0.97 ng/dL (ref 0.82–1.77)

## 2018-12-24 LAB — PSA: Prostate Specific Ag, Serum: 0.4 ng/mL (ref 0.0–4.0)

## 2018-12-26 ENCOUNTER — Telehealth: Payer: Self-pay

## 2018-12-26 ENCOUNTER — Other Ambulatory Visit: Payer: Self-pay | Admitting: Nurse Practitioner

## 2018-12-26 DIAGNOSIS — E781 Pure hyperglyceridemia: Secondary | ICD-10-CM

## 2018-12-26 MED ORDER — GEMFIBROZIL 600 MG PO TABS: 600.0000 mg | ORAL_TABLET | Freq: Two times a day (BID) | ORAL | 3 refills | Status: DC

## 2018-12-26 NOTE — Telephone Encounter (Signed)
-----   Message from [name], NP sent at 12/26/2018 12:58 PM EST ----- Please let the patient know that labs showed triglycerides very high on recent labs. Added gemfibrozil 600mg  twice daily to lower this number. Will recheck lipid panel after the next visit. I sent prescription to his pharmacy. Thanks.

## 2018-12-26 NOTE — Progress Notes (Signed)
Please let the patient know that labs showed triglycerides very high on recent labs. Added gemfibrozil 600mg  twice daily to lower this number. Will recheck lipid panel after the next visit. I sent prescription to his pharmacy. Thanks.

## 2018-12-26 NOTE — Progress Notes (Signed)
triglycerides very high on recent labs. Added gemfibrozil 600mg  twice daily to lower this number. Will recheck lipid panel after the next visit.

## 2018-12-26 NOTE — Telephone Encounter (Signed)
Pt notified of lab results and prescription at pharmacy

## 2018-12-31 DIAGNOSIS — Z800 Family history of malignant neoplasm of digestive organs: Secondary | ICD-10-CM | POA: Insufficient documentation

## 2018-12-31 DIAGNOSIS — R300 Dysuria: Secondary | ICD-10-CM | POA: Insufficient documentation

## 2018-12-31 DIAGNOSIS — Z1211 Encounter for screening for malignant neoplasm of colon: Secondary | ICD-10-CM | POA: Insufficient documentation

## 2019-01-16 ENCOUNTER — Other Ambulatory Visit: Payer: Self-pay

## 2019-01-16 DIAGNOSIS — I10 Essential (primary) hypertension: Secondary | ICD-10-CM

## 2019-01-16 IMAGING — DX DG HIP (WITH OR WITHOUT PELVIS) 1V PORT*R*
2 series · 2 of 2 positions shown · non-contrast
Comparison: None.

CLINICAL DATA: Status post right hip replacement

EXAM:
DG HIP (WITH OR WITHOUT PELVIS) 1V PORT RIGHT

[hip lat]
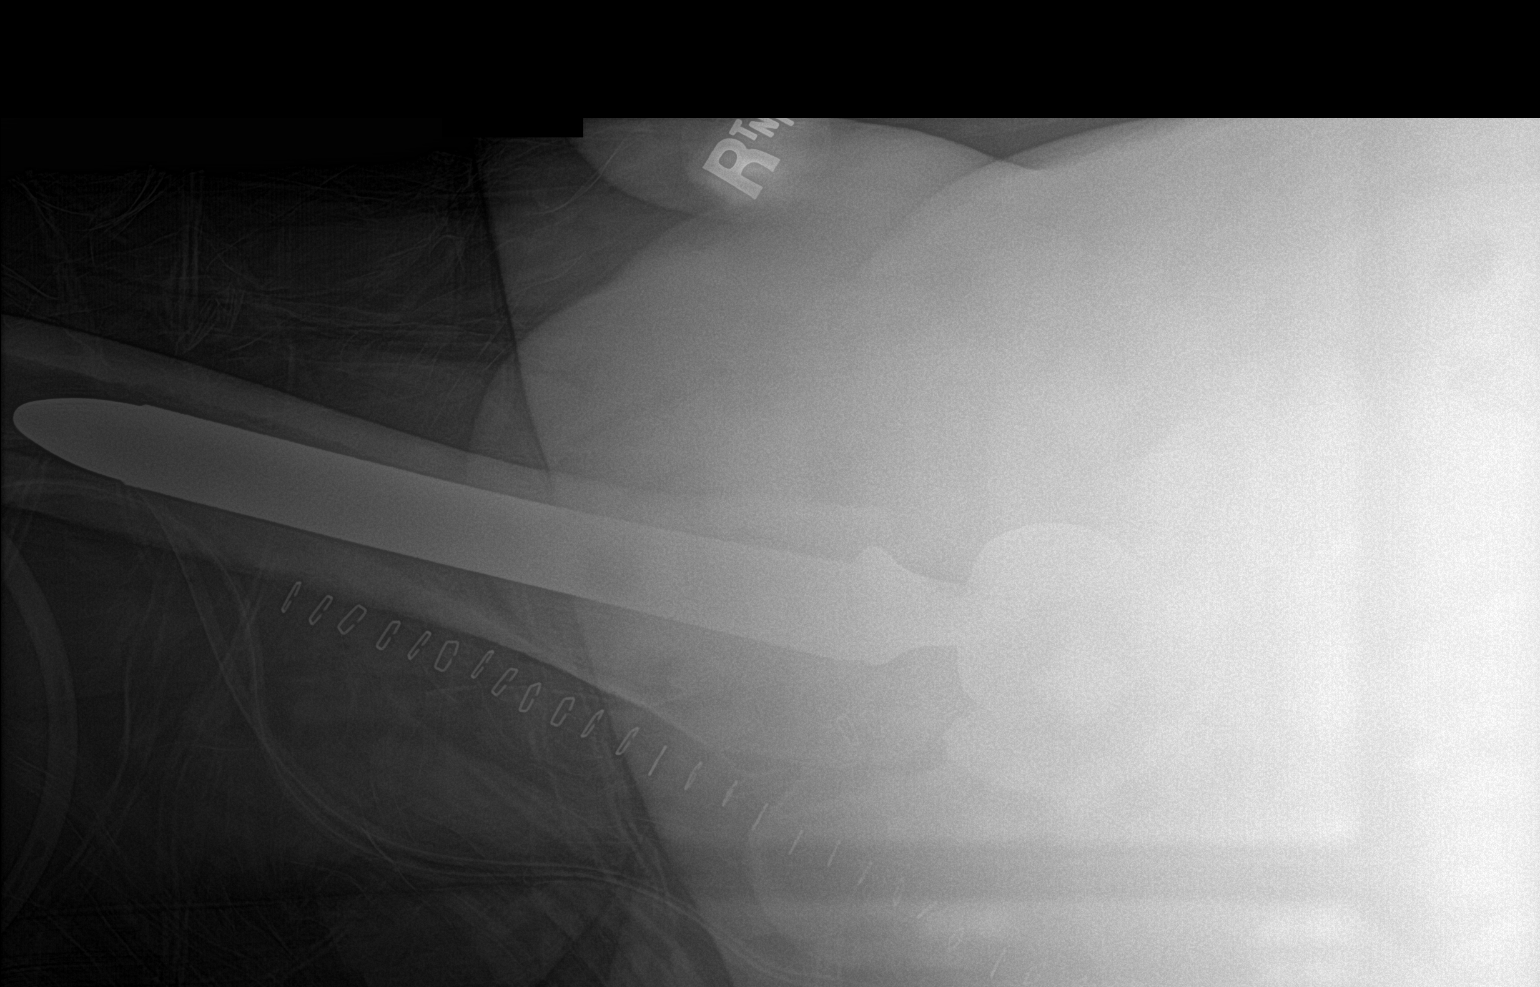

[pelvis ap]
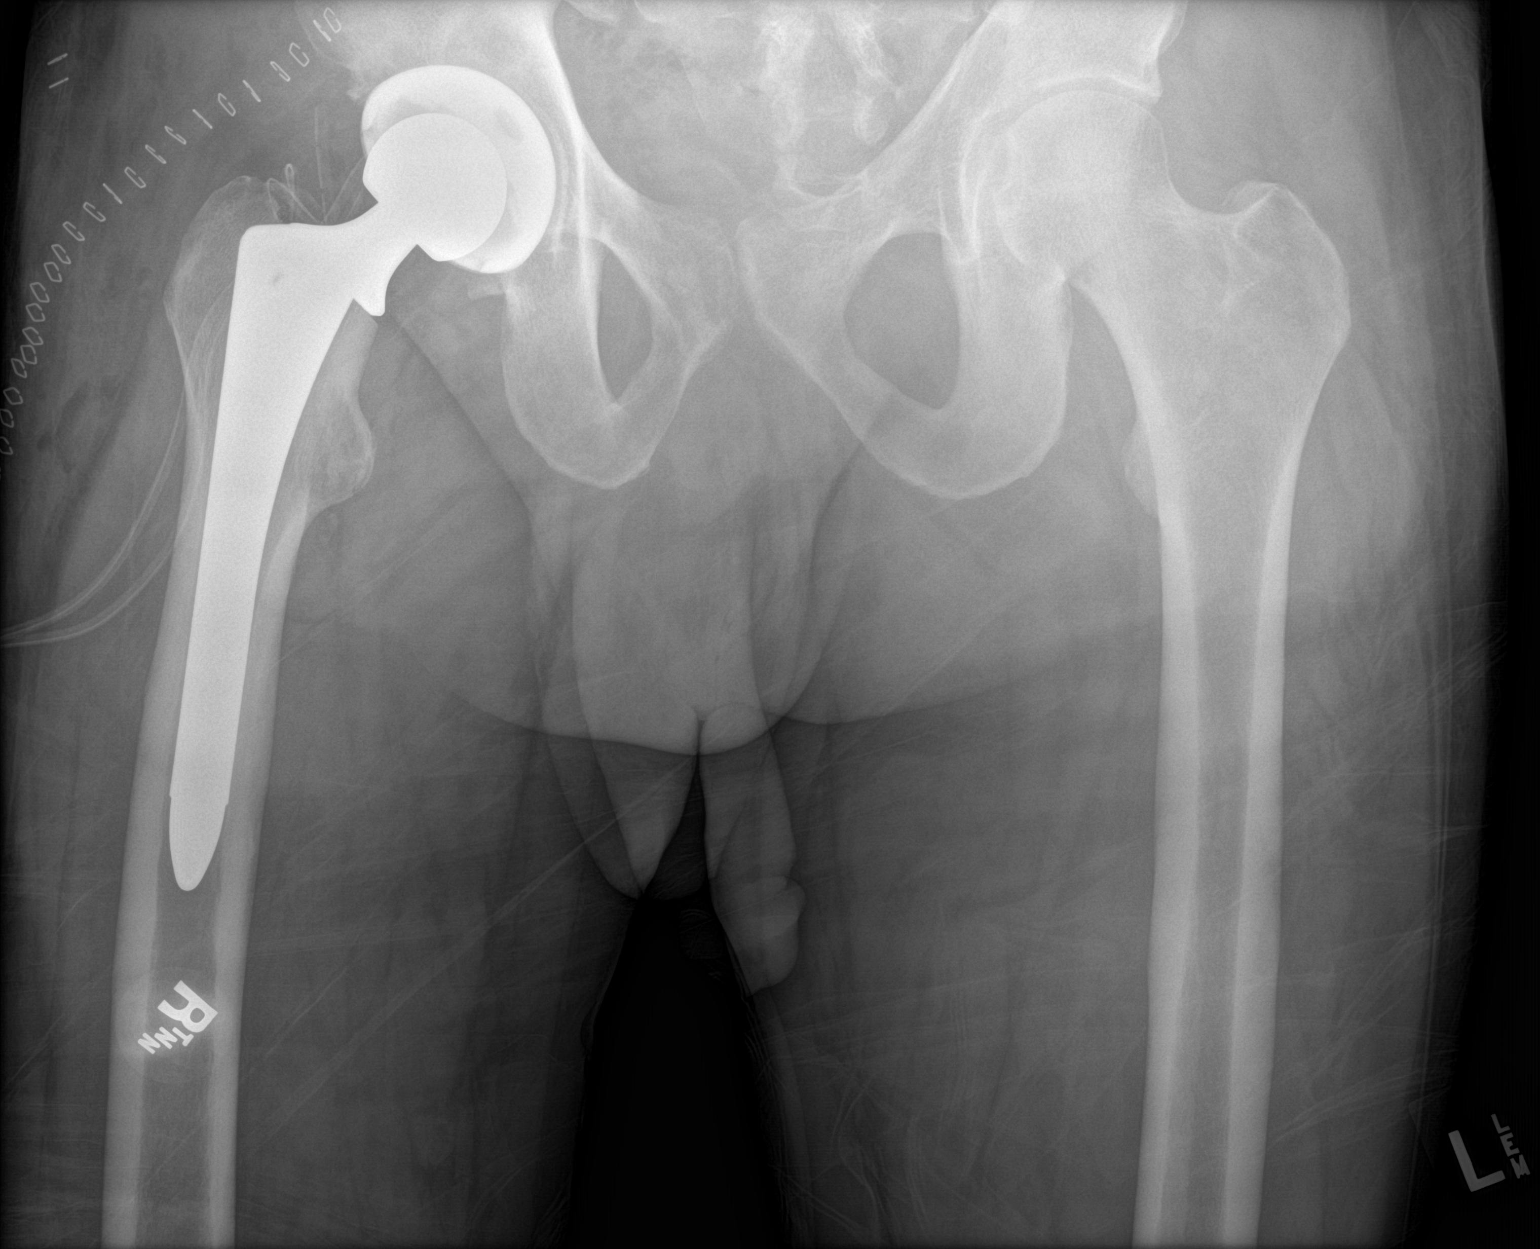

[2 of 2 positions shown; findings below may reference images not displayed]

FINDINGS: Right hip replacement is well visualized. No acute bony or soft
tissue abnormality is seen. Surgical drains are noted in place.
IMPRESSION: Status post right hip replacement.

## 2019-01-16 MED ORDER — HYDROCHLOROTHIAZIDE 25 MG PO TABS: 25.0000 mg | ORAL_TABLET | Freq: Every day | ORAL | 2 refills | Status: DC

## 2019-02-08 ENCOUNTER — Telehealth: Payer: Self-pay

## 2019-02-08 ENCOUNTER — Other Ambulatory Visit: Payer: Self-pay | Admitting: Nurse Practitioner

## 2019-02-08 DIAGNOSIS — E781 Pure hyperglyceridemia: Secondary | ICD-10-CM

## 2019-02-08 MED ORDER — FENOFIBRATE 48 MG PO TABS: 48.0000 mg | ORAL_TABLET | Freq: Every day | ORAL | 3 refills | Status: DC

## 2019-02-08 NOTE — Telephone Encounter (Signed)
Please let him know he can D/c gemfibrozil due to constipation and abdominal pain. Will try low dose tricor once daily with supper. New prescription sent to his pharmacy. thanks

## 2019-02-08 NOTE — Progress Notes (Signed)
D/c gemfibrozil due to constipation and abdominal pain. Will try low dose tricor once daily with supper. New prescription sent to his pharmacy.

## 2019-02-08 NOTE — Telephone Encounter (Signed)
Lmom to call us back 

## 2019-02-08 NOTE — Telephone Encounter (Signed)
Pt advised stopped gemfibrozil and we send tricor to [facility]

## 2019-03-02 ENCOUNTER — Other Ambulatory Visit: Payer: Self-pay | Admitting: Adult Health

## 2019-03-02 DIAGNOSIS — I10 Essential (primary) hypertension: Secondary | ICD-10-CM

## 2019-04-10 ENCOUNTER — Other Ambulatory Visit: Payer: Self-pay | Admitting: Adult Health

## 2019-04-10 DIAGNOSIS — I10 Essential (primary) hypertension: Secondary | ICD-10-CM

## 2019-04-18 ENCOUNTER — Telehealth: Payer: Self-pay

## 2019-04-18 NOTE — Telephone Encounter (Signed)
PATIENT CONFIRMED AND SCREENED FOR 04-20-19 OV.

## 2019-04-18 NOTE — Telephone Encounter (Signed)
LMOM FOR PATIENT TO CONFIRM AND SCREEN FOR 04-20-19 OV.

## 2019-04-20 ENCOUNTER — Encounter: Payer: Self-pay | Admitting: Adult Health

## 2019-04-20 ENCOUNTER — Other Ambulatory Visit: Payer: Self-pay

## 2019-04-20 ENCOUNTER — Ambulatory Visit: Payer: BC Managed Care – PPO | Admitting: Adult Health

## 2019-04-20 VITALS — BP 153/99 | HR 70 | Temp 97.5°F | Resp 16 | Ht 73.0 in | Wt 224.0 lb

## 2019-04-20 DIAGNOSIS — I10 Essential (primary) hypertension: Secondary | ICD-10-CM

## 2019-04-20 DIAGNOSIS — K219 Gastro-esophageal reflux disease without esophagitis: Secondary | ICD-10-CM | POA: Diagnosis not present

## 2019-04-20 DIAGNOSIS — M25562 Pain in left knee: Secondary | ICD-10-CM | POA: Diagnosis not present

## 2019-04-20 MED ORDER — MELOXICAM 15 MG PO TABS: 15.0000 mg | ORAL_TABLET | Freq: Every day | ORAL | 0 refills | Status: DC

## 2019-04-20 MED ORDER — OXYCODONE-ACETAMINOPHEN 2.5-325 MG PO TABS: 1.0000 | ORAL_TABLET | Freq: Every evening | ORAL | 0 refills | Status: DC | PRN

## 2019-04-20 NOTE — Progress Notes (Signed)
[facility] [place], [place] [address]  Internal MEDICINE  Office Visit Note  Patient Name: Evan Sullivan  [id number]  [id number]  Date of Service: 04/20/2019  Chief Complaint  Patient presents with  . Follow-up  . Knee Pain    left knee pain over a week  . Gastroesophageal Reflux  . Hypertension     HPI Pt is here for a sick visit. He reports Patellar tendon surgery on left knee in 2000.  He has intermittently had issues with this.  He is not sure if he hurt it or twisted it.  For the last week he has been having sub-patellar pain on that left knee.  He denies any warmth or redness. His blood pressure was slightly elevated in the office today.  He Denies Chest pain, Shortness of breath, palpitations, headache, or blurred vision.  He reports the knee pain has kept him up and night, and when he doesn't sleep well his blood pressure rises.      Current Medication:  Outpatient Encounter Medications as of 04/20/2019  Medication Sig  . acetaminophen (TYLENOL) 500 MG tablet Take 1,000 mg by mouth daily as needed for moderate pain.  Marland Kitchen amLODipine (NORVASC) 10 MG tablet TAKE 1 TABLET BY MOUTH EVERY DAY  . atenolol (TENORMIN) 100 MG tablet TAKE 1 TABLET BY MOUTH EVERY DAY  . colchicine (COLCRYS) 0.6 MG tablet Take 0.6 mg by mouth daily.  . fenofibrate (TRICOR) 48 MG tablet Take 1 tablet (48 mg total) by mouth daily.  . hydrochlorothiazide (HYDRODIURIL) 25 MG tablet Take 1 tablet (25 mg total) by mouth daily.  . hydrocortisone cream 1 % Apply 1 application topically daily as needed (rash).  Marland Kitchen ibuprofen (IBU) 800 MG tablet Take 1 tablet (800 mg total) by mouth every 8 (eight) hours as needed for moderate pain.  . pantoprazole (PROTONIX) 40 MG tablet Take 1 tablet (40 mg total) by mouth daily as needed (acid reflux).  . Colchicine (MITIGARE) 0.6 MG CAPS Take 1 capsule by mouth daily for 1 dose. (Patient taking differently: Take 1 capsule by mouth 2 (two) times daily  as needed (gout). )  . enoxaparin (LOVENOX) 40 MG/0.4ML injection Inject 0.4 mLs (40 mg total) into the skin daily for 14 days.  . meloxicam (MOBIC) 15 MG tablet Take 1 tablet (15 mg total) by mouth daily.  Marland Kitchen oxycodone-acetaminophen (PERCOCET) 2.5-325 MG tablet Take 1 tablet by mouth at bedtime as needed for pain.   No facility-administered encounter medications on file as of 04/20/2019.      Medical History: Past Medical History:  Diagnosis Date  . Arthritis   . Avascular necrosis of femoral head, right ([place]) 2019  . GERD (gastroesophageal reflux disease)   . Gout   . Hypertension   . Peptic ulcer disease      Vital Signs: BP (!) 153/99   Pulse 70   Temp (!) 97.5 F (36.4 C)   Resp 16   Ht 6\' 1"  (1.854 m)   Wt 224 lb (101.6 kg)   SpO2 99%   BMI 29.55 kg/m    Review of Systems  Constitutional: Negative.  Negative for chills, fatigue and unexpected weight change.  HENT: Negative.  Negative for congestion, rhinorrhea, sneezing and sore throat.   Eyes: Negative for redness.  Respiratory: Negative.  Negative for cough, chest tightness and shortness of breath.   Cardiovascular: Negative.  Negative for chest pain and palpitations.  Gastrointestinal: Negative.  Negative for abdominal pain, constipation,  diarrhea, nausea and vomiting.  Endocrine: Negative.   Genitourinary: Negative.  Negative for dysuria and frequency.  Musculoskeletal: Negative.  Negative for arthralgias, back pain, joint swelling and neck pain.       Left knee pain  Skin: Negative.  Negative for rash.  Allergic/Immunologic: Negative.   Neurological: Negative.  Negative for tremors and numbness.  Hematological: Negative for adenopathy. Does not bruise/bleed easily.  Psychiatric/Behavioral: Negative.  Negative for behavioral problems, sleep disturbance and suicidal ideas. The patient is not nervous/anxious.     Physical Exam Vitals and nursing note reviewed.  Constitutional:      General: He is not in  acute distress.    Appearance: He is well-developed. He is not diaphoretic.  HENT:     Head: Normocephalic and atraumatic.     Mouth/Throat:     Pharynx: No oropharyngeal exudate.  Eyes:     Pupils: Pupils are equal, round, and reactive to light.  Neck:     Thyroid: No thyromegaly.     Vascular: No JVD.     Trachea: No tracheal deviation.  Cardiovascular:     Rate and Rhythm: Normal rate and regular rhythm.     Heart sounds: Normal heart sounds. No murmur. No friction rub. No gallop.   Pulmonary:     Effort: Pulmonary effort is normal. No respiratory distress.     Breath sounds: Normal breath sounds. No wheezing or rales.  Chest:     Chest wall: No tenderness.  Abdominal:     Palpations: Abdomen is soft.     Tenderness: There is no abdominal tenderness. There is no guarding.  Musculoskeletal:        General: Normal range of motion.     Cervical back: Normal range of motion and neck supple.     Comments: Left knee pain.   Lymphadenopathy:     Cervical: No cervical adenopathy.  Skin:    General: Skin is warm and dry.  Neurological:     Mental Status: He is alert and oriented to person, place, and time.     Cranial Nerves: No cranial nerve deficit.  Psychiatric:        Behavior: Behavior normal.        Thought Content: Thought content normal.        Judgment: Judgment normal.     Assessment/Plan: 1. Left anterior knee pain  Encouraged patient to rest, elevate and ice knee.  Take antiinflammatories and use Percocet for night time pain.  Reviewed risks and possible side effects associated with taking opiates, benzodiazepines and other CNS depressants. Combination of these could cause dizziness and drowsiness. Advised patient not to drive or operate machinery when taking these medications, as patient's and other's life can be at risk and will have consequences. Patient verbalized understanding in this matter. Dependence and abuse for these drugs will be monitored closely. A  Controlled substance policy and procedure is on file which allows [facility] to order a urine drug screen test at any visit. Patient understands and agrees with the plan - meloxicam (MOBIC) 15 MG tablet; Take 1 tablet (15 mg total) by mouth daily.  Dispense: 20 tablet; Refill: 0 - oxycodone-acetaminophen (PERCOCET) 2.5-325 MG tablet; Take 1 tablet by mouth at bedtime as needed for pain.  Dispense: 10 tablet; Refill: 0  2. Essential hypertension Elevated today, continue to monitor.  Follow up in office in one week.   3. Gastroesophageal reflux disease without esophagitis Stable, continue present medication.  General Counseling: Dawan  verbalizes understanding of the findings of todays visit and agrees with plan of treatment. I have discussed any further diagnostic evaluation that may be needed or ordered today. We also reviewed his medications today. he has been encouraged to call the office with any questions or concerns that should arise related to todays visit.   No orders of the defined types were placed in this encounter.   Meds ordered this encounter  Medications  . meloxicam (MOBIC) 15 MG tablet    Sig: Take 1 tablet (15 mg total) by mouth daily.    Dispense:  20 tablet    Refill:  0  . oxycodone-acetaminophen (PERCOCET) 2.5-325 MG tablet    Sig: Take 1 tablet by mouth at bedtime as needed for pain.    Dispense:  10 tablet    Refill:  0    Time spent: 25 Minutes  This patient was seen by Orson Gear AGNP-C in Collaboration with Dr Lavera Guise as a part of collaborative care agreement.  Kendell Bane AGNP-C Internal Medicine

## 2019-04-24 ENCOUNTER — Telehealth: Payer: Self-pay

## 2019-04-24 NOTE — Telephone Encounter (Signed)
Patient rescheduled appointment on 03/24/2021to 05/15/2019. klh

## 2019-04-26 ENCOUNTER — Ambulatory Visit: Payer: BC Managed Care – PPO | Admitting: Adult Health

## 2019-05-03 ENCOUNTER — Other Ambulatory Visit: Payer: Self-pay

## 2019-05-03 DIAGNOSIS — E781 Pure hyperglyceridemia: Secondary | ICD-10-CM

## 2019-05-03 MED ORDER — FENOFIBRATE 48 MG PO TABS: 48.0000 mg | ORAL_TABLET | Freq: Every day | ORAL | 3 refills | Status: DC

## 2019-05-15 ENCOUNTER — Ambulatory Visit: Payer: BC Managed Care – PPO | Admitting: Nurse Practitioner

## 2019-06-20 ENCOUNTER — Encounter (HOSPITAL_COMMUNITY): Payer: Self-pay

## 2019-06-20 ENCOUNTER — Other Ambulatory Visit: Payer: Self-pay

## 2019-06-20 ENCOUNTER — Ambulatory Visit (HOSPITAL_COMMUNITY)
Admission: EM | Admit: 2019-06-20 | Discharge: 2019-06-20 | Disposition: A | Discharge status: Home / Self Care | Payer: BC Managed Care – PPO | Attending: Family Medicine | Admitting: Family Medicine

## 2019-06-20 ENCOUNTER — Ambulatory Visit (INDEPENDENT_AMBULATORY_CARE_PROVIDER_SITE_OTHER): Payer: BC Managed Care – PPO

## 2019-06-20 ENCOUNTER — Telehealth: Payer: Self-pay

## 2019-06-20 DIAGNOSIS — R109 Unspecified abdominal pain: Secondary | ICD-10-CM | POA: Diagnosis not present

## 2019-06-20 DIAGNOSIS — R1084 Generalized abdominal pain: Secondary | ICD-10-CM

## 2019-06-20 DIAGNOSIS — R110 Nausea: Secondary | ICD-10-CM | POA: Diagnosis not present

## 2019-06-20 DIAGNOSIS — K5900 Constipation, unspecified: Secondary | ICD-10-CM

## 2019-06-20 DIAGNOSIS — I10 Essential (primary) hypertension: Secondary | ICD-10-CM

## 2019-06-20 MED ORDER — LACTULOSE 20 GM/30ML PO SOLN: 30.0000 mL | Freq: Two times a day (BID) | ORAL | 0 refills | Status: DC | PRN

## 2019-06-20 NOTE — Discharge Instructions (Addendum)
You have been seen today for abdominal pain. Your evaluation was not suggestive of any emergent condition requiring medical intervention at this time. However, some abdominal problems make take more time to appear. Therefore, it is very important for you to pay attention to any new symptoms or worsening of your current condition.  Please return here or to the Emergency Department immediately should you begin to feel worse in any way or have any of the following symptoms: increasing or different abdominal pain, persistent vomiting, inability to drink fluids, fevers, or shaking chills.   Your blood pressure was noted to be slightly elevated during your visit today. If you are currently taking medication for high blood pressure, please ensure you are taking this as directed. If you do not have a history of high blood pressure and your blood pressure remains persistently elevated, you may need to begin taking a medication at some point. You may return here within the next few days to recheck if unable to see your primary care provider or if do not have a one.  BP (!) 140/97 (BP Location: Right Arm)   Pulse 72   Temp 98.4 F (36.9 C) (Oral)   Resp 19   Wt 102.1 kg   SpO2 100%   BMI 29.69 kg/m

## 2019-06-20 NOTE — ED Provider Notes (Signed)
[facility]   [id number] 06/20/19 Arrival Time: [id number]  ASSESSMENT & PLAN:  1. Generalized abdominal pain   2. Constipation, unspecified constipation type   3. Elevated blood pressure reading in office with diagnosis of hypertension     Benign abdominal exam. I have personally viewed the imaging studies ordered this visit. No signs of SBO; no free air. No indications for advanced abdominal/pelvic imaging at this time. Discussed.  Will treat as constipation. Could not tolerate Mg Citrate.  Begin: Meds ordered this encounter  Medications  . Lactulose 20 GM/30ML SOLN    Sig: Take 30 mLs (20 g total) by mouth 2 (two) times daily as needed (for constipation).    Dispense:  450 mL    Refill:  0    Will do his best to ensure adequate hydration.  Follow-up Information    [facility].   Specialty: Emergency Medicine Why: If symptoms worsen in any way. Contact information: [id number] [place]       [name], NP.   Specialty: Family Medicine Why: If worsening or failing to improve as anticipated. Contact information: [place] [address] [phone]            Discharge Instructions     You have been seen today for abdominal pain. Your evaluation was not suggestive of any emergent condition requiring medical intervention at this time. However, some abdominal problems make take more time to appear. Therefore, it is very important for you to pay attention to any new symptoms or worsening of your current condition.  Please return here or to the Emergency Department immediately should you begin to feel worse in any way or have any of the following symptoms: increasing or different abdominal pain, persistent vomiting, inability to drink fluids, fevers, or shaking chills.   Your blood pressure was noted to be slightly elevated during your  visit today. If you are currently taking medication for high blood pressure, please ensure you are taking this as directed. If you do not have a history of high blood pressure and your blood pressure remains persistently elevated, you may need to begin taking a medication at some point. You may return here within the next few days to recheck if unable to see your primary care provider or if do not have a one.  BP (!) 140/97 (BP Location: Right Arm)   Pulse 72   Temp 98.4 F (36.9 C) (Oral)   Resp 19   Wt 102.1 kg   SpO2 100%   BMI 29.69 kg/m       Reviewed expectations re: course of current medical issues. Questions answered. Outlined signs and symptoms indicating need for more acute intervention. Patient verbalized understanding. After Visit Summary given.   SUBJECTIVE: History from: patient. Evan Sullivan is a 49 y.o. male who presents with complaint of fairly persistent generalized abdominal discomfort; relates to not having a bowel movement for the past three days; usually is daily. "Feel bloated". Tried to take Mg Citrate but could not tolerate it; "made me throw up". No other n/v reported. Tolerating PO intake but doesn't feel as hungry as usual. Current symptoms do not wake him at night. Reports decreased flatus but is passing. Symptoms are unchanged since beginning. Fever: absent. Aggravating factors: have not been identified. Alleviating factors: have not been identified. Associated symptoms: mild fatigue. He denies belching, chills, dysuria, myalgias and sweats. Ambulatory without assistance. Urinary symptoms:  none. Called PCP and was told to come here for evaluation. No h/o abdominal surgery.   Past Surgical History:  Procedure Laterality Date  . COLONOSCOPY    . KNEE ARTHROSCOPY, MEDIAL PATELLO FEMORAL LIGAMENT REPAIR Bilateral 2004,2000  . TOTAL HIP ARTHROPLASTY Right 08/02/2017   Procedure: TOTAL HIP ARTHROPLASTY;  Surgeon: Dereck Leep, MD;  Location: [facility] ORS;   Service: Orthopedics;  Laterality: Right;   Increased blood pressure noted today. Reports that he is treated for HTN.  He reports taking medications as instructed, no medication side effects noted, no chest pain on exertion, no dyspnea on exertion, no swelling of ankles, no orthostatic dizziness or lightheadedness, no orthopnea or paroxysmal nocturnal dyspnea and no palpitations.   OBJECTIVE:  Vitals:   06/20/19 0943 06/20/19 0945  BP:  (!) 140/97  Pulse:  72  Resp:  19  Temp:  98.4 F (36.9 C)  TempSrc:  Oral  SpO2:  100%  Weight: 102.1 kg     General appearance: alert, oriented, no acute distress HEENT: [place]; AT; oropharynx moist Lungs: unlabored respirations Abdomen: overall soft; with very slight distention; mild  and poorly localized tenderness to palpation over abdomen; 'just soreness'; present bowel sounds; without appreciable masses or organomegaly; without guarding or rebound tenderness Back: without reported CVA tenderness; FROM at waist Extremities: without LE edema; symmetrical; without gross deformities Skin: warm and dry Neurologic: normal gait Psychological: alert and cooperative; normal mood and affect   Imaging: DG Abd 2 Views  Result Date: 06/20/2019 CLINICAL DATA:  Acute abdominal pain, nausea. EXAM: ABDOMEN - 2 VIEW COMPARISON:  None. FINDINGS: The bowel gas pattern is normal. There is no evidence of free air. No radio-opaque calculi or other significant radiographic abnormality is seen. IMPRESSION: Negative. Electronically Signed   By: Marijo Conception M.D.   On: 06/20/2019 10:49     No Known Allergies                                             Past Medical History:  Diagnosis Date  . Arthritis   . Avascular necrosis of femoral head, right ([place]) 2019  . GERD (gastroesophageal reflux disease)   . Gout   . Hypertension   . Peptic ulcer disease     Social History   Socioeconomic History  . Marital status: Single    Spouse name: Not on file  .  Number of children: Not on file  . Years of education: Not on file  . Highest education level: Not on file  Occupational History  . Not on file  Tobacco Use  . Smoking status: Never Smoker  . Smokeless tobacco: Never Used  Substance and Sexual Activity  . Alcohol use: Yes    Alcohol/week: 6.0 standard drinks    Types: 6 Cans of beer per week    Comment: 2 cans of beer most days  . Drug use: Yes    Types: Marijuana  . Sexual activity: Yes    Birth control/protection: Condom  Other Topics Concern  . Not on file  Social History Narrative  . Not on file   Social Determinants of Health   Financial Resource Strain:   . Difficulty of Paying Living Expenses:   Food Insecurity:   . Worried About Charity fundraiser in the Last Year:   . [place] in the Last Year:  Transportation Needs:   . Film/video editor (Medical):   Marland Kitchen Lack of Transportation (Non-Medical):   Physical Activity:   . Days of Exercise per Week:   . Minutes of Exercise per Session:   Stress:   . Feeling of Stress :   Social Connections:   . Frequency of Communication with Friends and Family:   . Frequency of Social Gatherings with Friends and Family:   . Attends Religious Services:   . Active Member of Clubs or Organizations:   . Attends Archivist Meetings:   Marland Kitchen Marital Status:   Intimate Partner Violence:   . Fear of Current or Ex-Partner:   . Emotionally Abused:   Marland Kitchen Physically Abused:   . Sexually Abused:     Family History  Problem Relation Age of Onset  . Hypertension Mother   . Hypertension Father   . Colon cancer Father      Vanessa Kick, MD 06/20/19 1119

## 2019-06-20 NOTE — ED Triage Notes (Signed)
Pt is here with abdominal pain, constipation since Sunday morning. Pt has taken Liquid Mag, prune juice to relieve discomfort.

## 2019-06-20 NOTE — Telephone Encounter (Signed)
Patient called with severe stomach pains for 2 days, otc laxative not working so pt will go to er so they can d work up and possible ct scan if needed. Evan Sullivan

## 2019-06-22 ENCOUNTER — Emergency Department (HOSPITAL_COMMUNITY): Payer: BC Managed Care – PPO

## 2019-06-22 ENCOUNTER — Emergency Department (HOSPITAL_COMMUNITY)
Admission: EM | Admit: 2019-06-22 | Discharge: 2019-06-22 | Disposition: A | Discharge status: Home / Self Care | Payer: BC Managed Care – PPO | Attending: Emergency Medicine | Admitting: Emergency Medicine

## 2019-06-22 ENCOUNTER — Encounter (HOSPITAL_COMMUNITY): Payer: Self-pay | Admitting: Emergency Medicine

## 2019-06-22 DIAGNOSIS — Z96641 Presence of right artificial hip joint: Secondary | ICD-10-CM | POA: Insufficient documentation

## 2019-06-22 DIAGNOSIS — I10 Essential (primary) hypertension: Secondary | ICD-10-CM | POA: Diagnosis not present

## 2019-06-22 DIAGNOSIS — K852 Alcohol induced acute pancreatitis without necrosis or infection: Secondary | ICD-10-CM | POA: Diagnosis not present

## 2019-06-22 DIAGNOSIS — Z79899 Other long term (current) drug therapy: Secondary | ICD-10-CM | POA: Insufficient documentation

## 2019-06-22 DIAGNOSIS — R1013 Epigastric pain: Secondary | ICD-10-CM | POA: Diagnosis not present

## 2019-06-22 LAB — URINALYSIS, ROUTINE W REFLEX MICROSCOPIC
Bacteria, UA: NONE SEEN
Bilirubin Urine: NEGATIVE
Glucose, UA: NEGATIVE mg/dL
Hgb urine dipstick: NEGATIVE
Ketones, ur: NEGATIVE mg/dL
Leukocytes,Ua: NEGATIVE
Nitrite: NEGATIVE
Protein, ur: >=300 mg/dL — AB
Specific Gravity, Urine: >1.046 — ABNORMAL HIGH (ref 1.005–1.030)
pH: 6 (ref 5.0–8.0)

## 2019-06-22 LAB — LIPASE, BLOOD: Lipase: 194 U/L — ABNORMAL HIGH (ref 11–51)

## 2019-06-22 LAB — CBC
HCT: 45.1 % (ref 39.0–52.0)
Hemoglobin: 15.1 g/dL (ref 13.0–17.0)
MCH: 32.3 pg (ref 26.0–34.0)
MCHC: 33.5 g/dL (ref 30.0–36.0)
MCV: 96.4 fL (ref 80.0–100.0)
Platelets: 275 10*3/uL (ref 150–400)
RBC: 4.68 MIL/uL (ref 4.22–5.81)
RDW: 12.4 % (ref 11.5–15.5)
WBC: 11.9 10*3/uL — ABNORMAL HIGH (ref 4.0–10.5)
nRBC: 0 % (ref 0.0–0.2)

## 2019-06-22 LAB — COMPREHENSIVE METABOLIC PANEL
ALT: 58 U/L — ABNORMAL HIGH (ref 0–44)
AST: 64 U/L — ABNORMAL HIGH (ref 15–41)
Albumin: 3.7 g/dL (ref 3.5–5.0)
Alkaline Phosphatase: 58 U/L (ref 38–126)
Anion gap: 13 (ref 5–15)
BUN: 11 mg/dL (ref 6–20)
CO2: 28 mmol/L (ref 22–32)
Calcium: 9.3 mg/dL (ref 8.9–10.3)
Chloride: 91 mmol/L — ABNORMAL LOW (ref 98–111)
Creatinine, Ser: 0.99 mg/dL (ref 0.61–1.24)
GFR calc Af Amer: >60 mL/min (ref >60)
GFR calc non Af Amer: >60 mL/min (ref >60)
Glucose, Bld: 162 mg/dL — ABNORMAL HIGH (ref 70–99)
Potassium: 4.1 mmol/L (ref 3.5–5.1)
Sodium: 132 mmol/L — ABNORMAL LOW (ref 135–145)
Total Bilirubin: 0.9 mg/dL (ref 0.3–1.2)
Total Protein: 8.2 g/dL — ABNORMAL HIGH (ref 6.5–8.1)

## 2019-06-22 MED ORDER — MORPHINE SULFATE (PF) 4 MG/ML IV SOLN
4.0000 mg | Freq: Once | INTRAVENOUS | Status: AC
  Administered 2019-06-22: 4 mg via INTRAVENOUS
  Filled 2019-06-22: qty 1

## 2019-06-22 MED ORDER — SODIUM CHLORIDE 0.9% FLUSH: 3.0000 mL | Freq: Once | INTRAVENOUS | Status: DC

## 2019-06-22 MED ORDER — IOHEXOL 300 MG/ML  SOLN
100.0000 mL | Freq: Once | INTRAMUSCULAR | Status: AC | PRN
  Administered 2019-06-22: 100 mL via INTRAVENOUS

## 2019-06-22 MED ORDER — ONDANSETRON 4 MG PO TBDP
4.0000 mg | ORAL_TABLET | Freq: Three times a day (TID) | ORAL | 0 refills | Status: DC | PRN
Start: 2019-06-22 — End: 2019-10-25

## 2019-06-22 MED ORDER — ONDANSETRON HCL 4 MG/2ML IJ SOLN
4.0000 mg | Freq: Once | INTRAMUSCULAR | Status: AC
  Administered 2019-06-22: 4 mg via INTRAVENOUS
  Filled 2019-06-22: qty 2

## 2019-06-22 MED ORDER — HYDROCODONE-ACETAMINOPHEN 5-325 MG PO TABS: 1.0000 | ORAL_TABLET | Freq: Four times a day (QID) | ORAL | 0 refills | Status: DC | PRN

## 2019-06-22 MED ORDER — SODIUM CHLORIDE 0.9 % IV BOLUS
1000.0000 mL | Freq: Once | INTRAVENOUS | Status: AC
  Administered 2019-06-22: 1000 mL via INTRAVENOUS

## 2019-06-22 NOTE — Discharge Instructions (Signed)
Care liquid diet over the next 2 days until your symptoms have improved.  Pain medicine as needed.  Return for any worsening symptoms.  Do not drink alcohol while taking this medication.  I would recommend slowly discontinuing alcohol use given this may be a cause of your pancreatitis.

## 2019-06-22 NOTE — ED Triage Notes (Signed)
Pt had abd pains since Saturday. Went to [facility] Tuesday but nothing has helped. Last BM had little yesterday but not very much.

## 2019-06-22 NOTE — ED Notes (Signed)
Pt transported to CT ?

## 2019-06-22 NOTE — ED Notes (Signed)
Pt returned from CT °

## 2019-06-22 NOTE — ED Notes (Signed)
Pt given sprite for PO/fluid challenge.

## 2019-06-22 NOTE — ED Provider Notes (Signed)
[facility] DEPT Provider Note   CSN: [id number] Arrival date & time: 06/22/19  1611     History Chief Complaint  Patient presents with  . Abdominal Pain    Evan Sullivan is a 49 y.o. male with past medical history significant for arthritis, hypertension, PUD, GERD who presents for evaluation of abdominal pain.  Was seen by urgent care 2 days ago.  Constipation at that time.  Had ultrasound which did not show evidence of SBO.  He was given lactulose as well as Pepcid for his epigastric pain.  Took 1 dose of lactulose on Tuesday and 2 doses yesterday.  Had small bowel movement yesterday however does not feel like he is "empty."  He is passing flatulence.  He has been able to tolerate p.o. intake with decreased appetite decreased solid food intake.  Pain lies however worse to diffuse upper abdomen.  Rates pain at 8/10.  No emesis.  He does admit to chronic alcohol use.  Approximately 6 beers daily.  Denies any chronic NSAID use, hemoptysis, DTs or withdrawal seizures.  Last drink 3 days ago.  No melena or bright red blood in his stools.  Denies prior abdominal surgeries.  Denies additional aggravating or relieving factors.  History obtained from patient and past medical records.  No interpreter is used.  HPI     Past Medical History:  Diagnosis Date  . Arthritis   . Avascular necrosis of femoral head, right ([place]) 2019  . GERD (gastroesophageal reflux disease)   . Gout   . Hypertension   . Peptic ulcer disease     Patient Active Problem List   Diagnosis Date Noted  . Screening for colon cancer 12/31/2018  . Family history of colon cancer 12/31/2018  . Dysuria 12/31/2018  . Gastroesophageal reflux disease without esophagitis 09/15/2017  . 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 Avascular necrosis of bone of hip, right ([place]) 03/15/2017  . Essential hypertension 03/15/2017  . History of peptic ulcer disease 03/15/2017  . Anemia 11/01/2015    . SOB (shortness of breath) 11/01/2015  . Syncope 11/01/2015    Past Surgical History:  Procedure Laterality Date  . COLONOSCOPY    . KNEE ARTHROSCOPY, MEDIAL PATELLO FEMORAL LIGAMENT REPAIR Bilateral 2004,2000  . TOTAL HIP ARTHROPLASTY Right 08/02/2017   Procedure: TOTAL HIP ARTHROPLASTY;  Surgeon: Dereck Leep, MD;  Location: [facility] ORS;  Service: Orthopedics;  Laterality: Right;       Family History  Problem Relation Age of Onset  . Hypertension Mother   . Hypertension Father   . Colon cancer Father     Social History   Tobacco Use  . Smoking status: Never Smoker  . Smokeless tobacco: Never Used  Substance Use Topics  . Alcohol use: Yes    Alcohol/week: 6.0 standard drinks    Types: 6 Cans of beer per week    Comment: 2 cans of beer most days  . Drug use: Yes    Types: Marijuana    Home Medications Prior to Admission medications   Medication Sig Start Date End Date Taking? Authorizing Provider  acetaminophen (TYLENOL) 500 MG tablet Take 1,000 mg by mouth daily as needed for moderate pain.   Yes [provider]  amLODipine (NORVASC) 10 MG tablet TAKE 1 TABLET BY MOUTH EVERY DAY 03/02/19  Yes Boscia, Heather E, NP  atenolol (TENORMIN) 100 MG tablet TAKE 1 TABLET BY MOUTH EVERY DAY 04/10/19  Yes [name], NP  Colchicine 0.6 MG CAPS Take 1 capsule by mouth daily as needed (gout pain).   Yes [provider]  fenofibrate (TRICOR) 48 MG tablet Take 1 tablet (48 mg total) by mouth daily. 05/03/19  Yes Scarboro, Audie Clear, NP  hydrochlorothiazide (HYDRODIURIL) 25 MG tablet Take 1 tablet (25 mg total) by mouth daily. 01/16/19  Yes Boscia, Greer Ee, NP  hydrocortisone cream 1 % Apply 1 application topically daily as needed (rash).   Yes [provider]  ibuprofen (ADVIL) 800 MG tablet Take 800 mg by mouth every 8 (eight) hours as needed for moderate pain.   Yes [provider]  Lactulose 20 GM/30ML SOLN Take 30 mLs (20 g total) by mouth 2  (two) times daily as needed (for constipation). 06/20/19  Yes Vanessa Kick, MD  pantoprazole (PROTONIX) 40 MG tablet Take 1 tablet (40 mg total) by mouth daily as needed (acid reflux). 09/15/17  Yes Boscia, Greer Ee, NP  Colchicine (MITIGARE) 0.6 MG CAPS Take 1 capsule by mouth daily for 1 dose. Patient taking differently: Take 1 capsule by mouth 2 (two) times daily as needed (gout).  05/26/17 07/14/17  [name], NP  enoxaparin (LOVENOX) 40 MG/0.4ML injection Inject 0.4 mLs (40 mg total) into the skin daily for 14 days. 08/05/17 08/19/17  Watt Climes, PA  HYDROcodone-acetaminophen (NORCO/VICODIN) 5-325 MG tablet Take 1-2 tablets by mouth every 6 (six) hours as needed. 06/22/19   Daquawn Seelman A, PA-C  ibuprofen (IBU) 800 MG tablet Take 1 tablet (800 mg total) by mouth every 8 (eight) hours as needed for moderate pain. Patient not taking: Reported on 06/22/2019 01/20/18   [name], NP  meloxicam (MOBIC) 15 MG tablet Take 1 tablet (15 mg total) by mouth daily. Patient not taking: Reported on 06/22/2019 04/20/19   Kendell Bane, NP  ondansetron (ZOFRAN ODT) 4 MG disintegrating tablet Take 1 tablet (4 mg total) by mouth every 8 (eight) hours as needed for nausea or vomiting. 06/22/19   Mlissa Tamayo A, PA-C  oxycodone-acetaminophen (PERCOCET) 2.5-325 MG tablet Take 1 tablet by mouth at bedtime as needed for pain. Patient not taking: Reported on 06/22/2019 04/20/19   Kendell Bane, NP    Allergies    Patient has no known allergies.  Review of Systems   Review of Systems  Constitutional: Negative.   HENT: Negative.   Respiratory: Negative.   Cardiovascular: Negative.   Gastrointestinal: Positive for abdominal pain. Negative for abdominal distention, anal bleeding, blood in stool, constipation, diarrhea, nausea, rectal pain and vomiting.  Genitourinary: Negative.   Musculoskeletal: Negative.   Skin: Negative.   Neurological: Negative.   All other systems reviewed and are  negative.   Physical Exam Updated Vital Signs BP (!) 157/101   Pulse 76   Temp 99 F (37.2 C) (Oral)   Resp 18   SpO2 98%   Physical Exam Vitals and nursing note reviewed.  Constitutional:      General: He is not in acute distress.    Appearance: He is well-developed. He is not ill-appearing, toxic-appearing or diaphoretic.  HENT:     Head: Normocephalic and atraumatic.     Mouth/Throat:     Mouth: Mucous membranes are moist.  Eyes:     Pupils: Pupils are equal, round, and reactive to light.  Cardiovascular:     Rate and Rhythm: Normal rate and regular rhythm.     Heart sounds: Normal heart sounds.  Pulmonary:     Effort: Pulmonary effort  is normal. No respiratory distress.     Breath sounds: Normal breath sounds.  Abdominal:     General: Bowel sounds are normal. There is no distension.     Palpations: Abdomen is soft.     Tenderness: There is generalized abdominal tenderness and tenderness in the right upper quadrant, epigastric area and left upper quadrant. There is no right CVA tenderness, left CVA tenderness or guarding. Negative signs include Murphy's sign and McBurney's sign.     Hernia: No hernia is present.     Comments: Generalized tenderness palpation however worse diffusely to upper abdomen.  Negative Murphy sign.  No overlying skin changes.  Normoactive bowel sounds.  Musculoskeletal:        General: Normal range of motion.     Cervical back: Normal range of motion and neck supple.  Skin:    General: Skin is warm and dry.     Capillary Refill: Capillary refill takes less than 2 seconds.  Neurological:     General: No focal deficit present.     Mental Status: He is alert.     ED Results / Procedures / Treatments   Labs (all labs ordered are listed, but only abnormal results are displayed) Labs Reviewed  LIPASE, BLOOD - Abnormal; Notable for the following components:      Result Value   Lipase 194 (*)    All other components within normal limits    COMPREHENSIVE METABOLIC PANEL - Abnormal; Notable for the following components:   Sodium 132 (*)    Chloride 91 (*)    Glucose, Bld 162 (*)    Total Protein 8.2 (*)    AST 64 (*)    ALT 58 (*)    All other components within normal limits  CBC - Abnormal; Notable for the following components:   WBC 11.9 (*)    All other components within normal limits  URINALYSIS, ROUTINE W REFLEX MICROSCOPIC - Abnormal; Notable for the following components:   Color, Urine AMBER (*)    Specific Gravity, Urine >1.046 (*)    Protein, ur >=300 (*)    All other components within normal limits    EKG None  Radiology CT Abdomen Pelvis W Contrast  Result Date: 06/22/2019 CLINICAL DATA:  Epigastric pain. EXAM: CT ABDOMEN AND PELVIS WITH CONTRAST TECHNIQUE: Multidetector CT imaging of the abdomen and pelvis was performed using the standard protocol following bolus administration of intravenous contrast. CONTRAST:  166m OMNIPAQUE IOHEXOL 300 MG/ML  SOLN COMPARISON:  CT dated March 2nd, 2017. FINDINGS: Lower chest: The lung bases are clear. The heart size is normal. Hepatobiliary: The liver is normal. Normal gallbladder.There is no biliary ductal dilation. Pancreas: There is mild peripancreatic fat stranding and free fluid. The pancreas enhances uniformly. Spleen: Unremarkable. Adrenals/Urinary Tract: --Adrenal glands: Unremarkable. --Right kidney/ureter: No hydronephrosis or radiopaque kidney stones. --Left kidney/ureter: No hydronephrosis or radiopaque kidney stones. --Urinary bladder: Unremarkable. Stomach/Bowel: --Stomach/Duodenum: No hiatal hernia or other gastric abnormality. Normal duodenal course and caliber. --Small bowel: Unremarkable. --Colon: Unremarkable. --Appendix: Normal. Vascular/Lymphatic: Atherosclerotic calcification is present within the non-aneurysmal abdominal aorta, without hemodynamically significant stenosis. 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 Collateral  flow is noted through the prominent GDA. --No retroperitoneal lymphadenopathy. --No mesenteric lymphadenopathy. --No pelvic or inguinal lymphadenopathy. Reproductive: Unremarkable Other: No ascites or free air. The abdominal wall is normal. Musculoskeletal. No acute displaced fractures. The patient is status post total hip arthroplasty on the right. IMPRESSION: 1. Findings consistent with  acute uncomplicated pancreatitis. 2. 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 This finding has progressed somewhat since 2017. Aortic Atherosclerosis (ICD10-I70.0). Electronically Signed   By: Constance Holster M.D.   On: 06/22/2019 19:25    Procedures Procedures (including critical care time)  Medications Ordered in ED Medications  sodium chloride 0.9 % bolus 1,000 mL (0 mLs Intravenous Stopped 06/22/19 2032)  ondansetron (ZOFRAN) injection 4 mg (4 mg Intravenous Given 06/22/19 1849)  morphine 4 MG/ML injection 4 mg (4 mg Intravenous Given 06/22/19 1850)  iohexol (OMNIPAQUE) 300 MG/ML solution 100 mL (100 mLs Intravenous Contrast Given 06/22/19 1902)  morphine 4 MG/ML injection 4 mg (4 mg Intravenous Given 06/22/19 2047)    ED Course  I have reviewed the triage vital signs and the nursing notes.  Pertinent labs & imaging results that were available during my care of the patient were reviewed by me and considered in my medical decision making (see chart for details).  49 year old male presents for evaluation of abdominal pain.  Seen by urgent care 2 days and diagnosed with constipation.  Has been on lactulose.  Had small bowel movement yesterday however does not feel "empty."  Pain located generally to upper abdomen however negative Murphy sign.  No rebound or guarding.  Heart and lungs clear.  No chest pain or shortness of breath.  He is passing flatulence.  Patient is a chronic alcohol user, 6 beers daily.  No prior history of DTs or withdrawal  seizures.  Last drink 3 days ago.  No melena or bright red blood per rectum with last stool.  Labs obtained from triage  Labs and imaging personally reviewed and interpreted.  CBC with mild leukocytosis at 26.9 Metabolic panel with mild hyponatremia to 132, hyperglycemia to 162, mildly elevated AST at 64, ALT at 58.  Normal T bili, alk phos. Lipase 194 CTAP with acute, uncomplicated pancreatitis  Patient reassessed.  Feels improved.  He is tolerating p.o. intake without difficulty.  Reassessment shows nonsurgical abdomen without rebound or guarding.  Discussed CT imaging.  Likely alcohol induced pancreatitis without complication due to his history of alcohol use.  No evidence of necrosis or infectious process.  No dilated CBD duct to suggest gallstone pancreatitis.  Discussed inpatient management for pain and antiemetic control versus outpatient management.  Patient and significant other in room would prefer trial of outpatient management.  Discussed bowel rest, pain medicine as well as decreasing his alcohol intake.  Discussed close follow-up with PCP.  Patient does not meet the SIRS or Sepsis criteria.  On repeat exam patient does not have a surgical abdomin and there are no peritoneal signs.  No indication of appendicitis, bowel obstruction, bowel perforation, cholecystitis, diverticulitis.    The patient has been appropriately medically screened and/or stabilized in the ED. I have low suspicion for any other emergent medical condition which would require further screening, evaluation or treatment in the ED or require inpatient management.  Patient is hemodynamically stable and in no acute distress.  Patient able to ambulate in department prior to ED.  Evaluation does not show acute pathology that would require ongoing or additional emergent interventions while in the emergency department or further inpatient treatment.  I have discussed the diagnosis with the patient and answered all questions.   Pain is been managed while in the emergency department and patient has no further complaints prior to discharge.  Patient is comfortable with plan discussed in room and  is stable for discharge at this time.  I have discussed strict return precautions for returning to the emergency department.  Patient was encouraged to follow-up with PCP/specialist refer to at discharge.    MDM Rules/Calculators/A&P                      Final Clinical Impression(s) / ED Diagnoses Final diagnoses:  Alcohol-induced acute pancreatitis without infection or necrosis    Rx / DC Orders ED Discharge Orders         Ordered    HYDROcodone-acetaminophen (NORCO/VICODIN) 5-325 MG tablet  Every 6 hours PRN     06/22/19 2118    ondansetron (ZOFRAN ODT) 4 MG disintegrating tablet  Every 8 hours PRN     06/22/19 2118           Jonavin Seder A, PA-C 06/22/19 2121    Isla Pence, MD 06/22/19 2212

## 2019-06-29 ENCOUNTER — Other Ambulatory Visit: Payer: Self-pay

## 2019-06-29 MED ORDER — COLCHICINE 0.6 MG PO CAPS: 1.0000 | ORAL_CAPSULE | Freq: Every day | ORAL | 3 refills | Status: DC | PRN

## 2019-07-05 ENCOUNTER — Telehealth: Payer: Self-pay

## 2019-07-05 NOTE — Telephone Encounter (Signed)
Left a message and asked pt to call and schedule hospital follow up and review meds due to htn. Beth

## 2019-08-03 ENCOUNTER — Other Ambulatory Visit: Payer: Self-pay | Admitting: Adult Health

## 2019-08-03 DIAGNOSIS — E781 Pure hyperglyceridemia: Secondary | ICD-10-CM

## 2019-09-18 ENCOUNTER — Other Ambulatory Visit: Payer: Self-pay

## 2019-09-18 DIAGNOSIS — I10 Essential (primary) hypertension: Secondary | ICD-10-CM

## 2019-09-18 MED ORDER — AMLODIPINE BESYLATE 10 MG PO TABS: 10.0000 mg | ORAL_TABLET | Freq: Every day | ORAL | 1 refills | Status: DC

## 2019-10-16 ENCOUNTER — Other Ambulatory Visit: Payer: Self-pay

## 2019-10-16 DIAGNOSIS — I10 Essential (primary) hypertension: Secondary | ICD-10-CM

## 2019-10-16 MED ORDER — HYDROCHLOROTHIAZIDE 25 MG PO TABS: 25.0000 mg | ORAL_TABLET | Freq: Every day | ORAL | 0 refills | Status: DC

## 2019-10-16 MED ORDER — ATENOLOL 100 MG PO TABS: 100.0000 mg | ORAL_TABLET | Freq: Every day | ORAL | 0 refills | Status: DC

## 2019-10-23 ENCOUNTER — Telehealth: Payer: Self-pay

## 2019-10-23 ENCOUNTER — Ambulatory Visit: Payer: BC Managed Care – PPO | Admitting: Nurse Practitioner

## 2019-10-23 NOTE — Telephone Encounter (Signed)
confirmed and screened for 10-25-19 ov.

## 2019-10-25 ENCOUNTER — Other Ambulatory Visit: Payer: Self-pay

## 2019-10-25 ENCOUNTER — Ambulatory Visit: Payer: BC Managed Care – PPO | Admitting: Internal Medicine

## 2019-10-25 ENCOUNTER — Encounter: Payer: Self-pay | Admitting: Hospice and Palliative Medicine

## 2019-10-25 DIAGNOSIS — I10 Essential (primary) hypertension: Secondary | ICD-10-CM | POA: Diagnosis not present

## 2019-10-25 DIAGNOSIS — R361 Hematospermia: Secondary | ICD-10-CM

## 2019-10-25 DIAGNOSIS — R300 Dysuria: Secondary | ICD-10-CM

## 2019-10-25 DIAGNOSIS — Z800 Family history of malignant neoplasm of digestive organs: Secondary | ICD-10-CM | POA: Diagnosis not present

## 2019-10-25 DIAGNOSIS — R7301 Impaired fasting glucose: Secondary | ICD-10-CM

## 2019-10-25 DIAGNOSIS — M25562 Pain in left knee: Secondary | ICD-10-CM

## 2019-10-25 DIAGNOSIS — Z125 Encounter for screening for malignant neoplasm of prostate: Secondary | ICD-10-CM

## 2019-10-25 DIAGNOSIS — Z0001 Encounter for general adult medical examination with abnormal findings: Secondary | ICD-10-CM | POA: Diagnosis not present

## 2019-10-25 LAB — POCT URINALYSIS DIPSTICK
Bilirubin, UA: NEGATIVE
Blood, UA: NEGATIVE
Glucose, UA: NEGATIVE
Ketones, UA: 1.5
Leukocytes, UA: NEGATIVE
Nitrite, UA: NEGATIVE
Protein, UA: POSITIVE — AB
Spec Grav, UA: 1.02 (ref 1.010–1.025)
Urobilinogen, UA: 0.2 E.U./dL
pH, UA: 5 (ref 5.0–8.0)

## 2019-10-25 LAB — GLUCOSE, POCT (MANUAL RESULT ENTRY): POC Glucose: 122 mg/dl — AB (ref 70–99)

## 2019-10-25 MED ORDER — IBUPROFEN 800 MG PO TABS: ORAL_TABLET | ORAL | 0 refills | Status: DC

## 2019-10-25 NOTE — Progress Notes (Signed)
[facility] [place], [place] [address]  Internal MEDICINE  Office Visit Note  Patient Name: Evan Sullivan  [id number]  [id number]  Date of Service: 11/02/2019  Chief Complaint  Patient presents with  . Follow-up    blood in semen, two weeks ago left knee was swollen, but it cleared up after a week  . Hypertension  . Quality Metric Gaps    HepC, TDAP    HPI  Pt is here for routine annual wellness visit. Feels well, works as a Building control surveyor. Had an episode of blood in his semen Which has not recurred, denies any pain in his groin, no testicular mass, no dysuria symptoms. Pt does have fhx of colon cancer and will like to see GI. Blood pressure is slightly elevated as well , pt does admit to consuming ETOH. Takes all medications as prescribed. Pt will like to have a refill on his Ibuprofen   Current Medication: Outpatient Encounter Medications as of 10/25/2019  Medication Sig  . amLODipine (NORVASC) 10 MG tablet Take 1 tablet (10 mg total) by mouth daily.  Marland Kitchen atenolol (TENORMIN) 100 MG tablet Take 1 tablet (100 mg total) by mouth daily.  . Colchicine 0.6 MG CAPS Take 1 capsule by mouth daily as needed (gout pain).  . fenofibrate (TRICOR) 48 MG tablet TAKE 1 TABLET BY MOUTH EVERY DAY  . hydrochlorothiazide (HYDRODIURIL) 25 MG tablet Take 1 tablet (25 mg total) by mouth daily.  . hydrocortisone cream 1 % Apply 1 application topically daily as needed (rash).  . pantoprazole (PROTONIX) 40 MG tablet Take 1 tablet (40 mg total) by mouth daily as needed (acid reflux).  . [DISCONTINUED] HYDROcodone-acetaminophen (NORCO/VICODIN) 5-325 MG tablet Take 1-2 tablets by mouth every 6 (six) hours as needed.  . [DISCONTINUED] ibuprofen (ADVIL) 800 MG tablet Take 800 mg by mouth every 8 (eight) hours as needed for moderate pain.  . [DISCONTINUED] ibuprofen (IBU) 800 MG tablet Take 1 tablet (800 mg total) by mouth every 8 (eight) hours as needed for moderate pain.  Marland Kitchen ibuprofen (IBU) 800  MG tablet Take one tab po bid with food prn only  . [DISCONTINUED] acetaminophen (TYLENOL) 500 MG tablet Take 1,000 mg by mouth daily as needed for moderate pain. (Patient not taking: Reported on 10/25/2019)  . [DISCONTINUED] Colchicine (MITIGARE) 0.6 MG CAPS Take 1 capsule by mouth daily for 1 dose. (Patient taking differently: Take 1 capsule by mouth 2 (two) times daily as needed (gout). )  . [DISCONTINUED] enoxaparin (LOVENOX) 40 MG/0.4ML injection Inject 0.4 mLs (40 mg total) into the skin daily for 14 days.  . [DISCONTINUED] Lactulose 20 GM/30ML SOLN Take 30 mLs (20 g total) by mouth 2 (two) times daily as needed (for constipation). (Patient not taking: Reported on 10/25/2019)  . [DISCONTINUED] meloxicam (MOBIC) 15 MG tablet Take 1 tablet (15 mg total) by mouth daily. (Patient not taking: Reported on 06/22/2019)  . [DISCONTINUED] ondansetron (ZOFRAN ODT) 4 MG disintegrating tablet Take 1 tablet (4 mg total) by mouth every 8 (eight) hours as needed for nausea or vomiting. (Patient not taking: Reported on 10/25/2019)  . [DISCONTINUED] oxycodone-acetaminophen (PERCOCET) 2.5-325 MG tablet Take 1 tablet by mouth at bedtime as needed for pain. (Patient not taking: Reported on 06/22/2019)   No facility-administered encounter medications on file as of 10/25/2019.    Surgical History: Past Surgical History:  Procedure Laterality Date  . COLONOSCOPY    . KNEE ARTHROSCOPY, MEDIAL PATELLO FEMORAL LIGAMENT REPAIR Bilateral 2004,2000  . TOTAL  HIP ARTHROPLASTY Right 08/02/2017   Procedure: TOTAL HIP ARTHROPLASTY;  Surgeon: Dereck Leep, MD;  Location: [facility] ORS;  Service: Orthopedics;  Laterality: Right;    Medical History: Past Medical History:  Diagnosis Date  . Arthritis   . Avascular necrosis of femoral head, right ([place]) 2019  . GERD (gastroesophageal reflux disease)   . Gout   . Hypertension   . Peptic ulcer disease     Family History: Family History  Problem Relation Age of Onset  .  Hypertension Mother   . Hypertension Father   . Colon cancer Father     Social History   Socioeconomic History  . Marital status: Single    Spouse name: Not on file  . Number of children: Not on file  . Years of education: Not on file  . Highest education level: Not on file  Occupational History  . Not on file  Tobacco Use  . Smoking status: Never Smoker  . Smokeless tobacco: Never Used  Vaping Use  . Vaping Use: Never used  Substance and Sexual Activity  . Alcohol use: Yes    Alcohol/week: 6.0 standard drinks    Types: 6 Cans of beer per week    Comment: 2 cans of beer most days  . Drug use: Yes    Types: Marijuana  . Sexual activity: Yes    Birth control/protection: Condom  Other Topics Concern  . Not on file  Social History Narrative  . Not on file   Social Determinants of Health   Financial Resource Strain:   . Difficulty of Paying Living Expenses: Not on file  Food Insecurity:   . Worried About Charity fundraiser in the Last Year: Not on file  . Ran Out of Food in the Last Year: Not on file  Transportation Needs:   . Lack of Transportation (Medical): Not on file  . Lack of Transportation (Non-Medical): Not on file  Physical Activity:   . Days of Exercise per Week: Not on file  . Minutes of Exercise per Session: Not on file  Stress:   . Feeling of Stress : Not on file  Social Connections:   . Frequency of Communication with Friends and Family: Not on file  . Frequency of Social Gatherings with Friends and Family: Not on file  . Attends Religious Services: Not on file  . Active Member of Clubs or Organizations: Not on file  . Attends Archivist Meetings: Not on file  . Marital Status: Not on file  Intimate Partner Violence:   . Fear of Current or Ex-Partner: Not on file  . Emotionally Abused: Not on file  . Physically Abused: Not on file  . Sexually Abused: Not on file      Review of Systems  Constitutional: Negative for chills, fatigue  and unexpected weight change.  HENT: Negative for congestion, postnasal drip, rhinorrhea, sneezing and sore throat.   Eyes: Negative for redness.  Respiratory: Negative for cough, chest tightness and shortness of breath.   Cardiovascular: Negative for chest pain and palpitations.  Gastrointestinal: Negative for abdominal pain, constipation, diarrhea, nausea and vomiting.  Genitourinary: Negative for dysuria, frequency, hematuria, scrotal swelling and testicular pain.  Musculoskeletal: Positive for joint swelling. Negative for arthralgias, back pain and neck pain.  Skin: Negative for rash.  Neurological: Negative.  Negative for tremors and numbness.  Hematological: Negative for adenopathy. Does not bruise/bleed easily.  Psychiatric/Behavioral: Negative for behavioral problems (Depression), sleep disturbance and suicidal ideas. The patient  is not nervous/anxious.     Vital Signs: BP 140/90   Pulse 75   Temp 99.2 F (37.3 C)   Resp 16   Ht 6\' 1"  (1.854 m)   Wt 223 lb 6.4 oz (101.3 kg)   SpO2 99%   BMI 29.47 kg/m    Physical Exam Constitutional:      General: He is not in acute distress.    Appearance: He is well-developed. He is not diaphoretic.  HENT:     Head: Normocephalic and atraumatic.     Right Ear: Tympanic membrane normal.     Left Ear: Tympanic membrane normal.     Nose: Nose normal.     Mouth/Throat:     Pharynx: No oropharyngeal exudate.  Eyes:     Extraocular Movements: Extraocular movements intact.     Pupils: Pupils are equal, round, and reactive to light.     Comments: Conjunctival erythema    Neck:     Thyroid: No thyromegaly.     Vascular: No JVD.     Trachea: No tracheal deviation.  Cardiovascular:     Rate and Rhythm: Normal rate and regular rhythm.     Heart sounds: Normal heart sounds. No murmur heard.  No friction rub. No gallop.   Pulmonary:     Effort: Pulmonary effort is normal. No respiratory distress.     Breath sounds: No wheezing or  rales.  Chest:     Chest wall: No tenderness.  Abdominal:     General: Bowel sounds are normal.     Palpations: Abdomen is soft.  Musculoskeletal:        General: Normal range of motion.     Cervical back: Normal range of motion and neck supple.  Lymphadenopathy:     Cervical: No cervical adenopathy.  Skin:    General: Skin is warm and dry.  Neurological:     Mental Status: He is alert and oriented to person, place, and time.     Cranial Nerves: No cranial nerve deficit.  Psychiatric:        Mood and Affect: Mood normal.        Behavior: Behavior normal.        Thought Content: Thought content normal.        Judgment: Judgment normal.     Assessment/Plan: 1. Encounter for general adult medical examination with abnormal findings Updated all labs and other PHM according to guidelines  - CBC with Differential/Platelet - Lipid Panel With LDL/HDL Ratio - TSH - T4, free - Comprehensive metabolic panel  2. Family history of colon cancer requiring screening colonoscopy Will get consult with GI as pt has FHX of colon cancer  - Ambulatory referral to Gastroenterology - CBC with Differential/Platelet  3. Blood in semen Self limiting episode, he was instructed to monitor, no other symptoms. PSA is ordered   4. Essential hypertension BP is slightly elevated today, will monitor for now ( pt does drink on a regular basis)   5. Left anterior knee pain Continue prn use of Ibuprofen, GI and renal side effects are discussed with long term chronic use  - ibuprofen (IBU) 800 MG tablet; Take one tab po bid with food prn only  Dispense: 45 tablet; Refill: 0  6. Impaired fasting glucose CBG is 122, will monitor  - POCT Glucose (CBG)  7. Dysuria - POCT Urinalysis Dipstick  8. Special screening for malignant neoplasm of prostate - PSA  General Counseling: Kiril verbalizes understanding of the findings of  todays visit and agrees with plan of treatment. I have discussed any further  diagnostic evaluation that may be needed or ordered today. We also reviewed his medications today. he has been encouraged to call the office with any questions or concerns that should arise related to todays visit.   Orders Placed This Encounter  Procedures  . CBC with Differential/Platelet  . Lipid Panel With LDL/HDL Ratio  . TSH  . T4, free  . Comprehensive metabolic panel  . Ambulatory referral to Gastroenterology  . POCT Glucose (CBG)  . POCT Urinalysis Dipstick    Meds ordered this encounter  Medications  . ibuprofen (IBU) 800 MG tablet    Sig: Take one tab po bid with food prn only    Dispense:  45 tablet    Refill:  0    Total time spent:40 Minutes Time spent includes review of chart, medications, test results, and follow up plan with the patient.      Dr Lavera Guise Internal medicine

## 2019-11-12 ENCOUNTER — Other Ambulatory Visit: Payer: Self-pay | Admitting: Internal Medicine

## 2019-11-12 DIAGNOSIS — M25562 Pain in left knee: Secondary | ICD-10-CM

## 2019-11-13 ENCOUNTER — Other Ambulatory Visit: Payer: Self-pay

## 2019-11-13 ENCOUNTER — Telehealth (INDEPENDENT_AMBULATORY_CARE_PROVIDER_SITE_OTHER): Payer: Self-pay | Admitting: Gastroenterology

## 2019-11-13 DIAGNOSIS — Z1211 Encounter for screening for malignant neoplasm of colon: Secondary | ICD-10-CM

## 2019-11-13 DIAGNOSIS — Z800 Family history of malignant neoplasm of digestive organs: Secondary | ICD-10-CM

## 2019-11-13 MED ORDER — NA SULFATE-K SULFATE-MG SULF 17.5-3.13-1.6 GM/177ML PO SOLN: 1.0000 | Freq: Once | ORAL | 0 refills | Status: AC

## 2019-11-13 NOTE — Progress Notes (Signed)
Gastroenterology Pre-Procedure Review  Request Date: 11/30/19 Requesting Physician: Dr. Marius Ditch  PATIENT REVIEW QUESTIONS: The patient responded to the following health history questions as indicated:    1. Are you having any GI issues? no 2. Do you have a personal history of Polyps? no 3. Do you have a family history of Colon Cancer or Polyps? yes (father colon cancer) 4. Diabetes Mellitus? no 5. Joint replacements in the past 12 months?no 6. Major health problems in the past 3 months?no 7. Any artificial heart valves, MVP, or defibrillator?no    MEDICATIONS & ALLERGIES:    Patient reports the following regarding taking any anticoagulation/antiplatelet therapy:   Plavix, Coumadin, Eliquis, Xarelto, Lovenox, Pradaxa, Brilinta, or Effient? no Aspirin? no  Patient confirms/reports the following medications:  Current Outpatient Medications  Medication Sig Dispense Refill  . amLODipine (NORVASC) 10 MG tablet Take 1 tablet (10 mg total) by mouth daily. 90 tablet 1  . atenolol (TENORMIN) 100 MG tablet Take 1 tablet (100 mg total) by mouth daily. 90 tablet 0  . Colchicine 0.6 MG CAPS Take 1 capsule by mouth daily as needed (gout pain). 30 capsule 3  . fenofibrate (TRICOR) 48 MG tablet TAKE 1 TABLET BY MOUTH EVERY DAY 90 tablet 1  . hydrochlorothiazide (HYDRODIURIL) 25 MG tablet Take 1 tablet (25 mg total) by mouth daily. 90 tablet 0  . hydrocortisone cream 1 % Apply 1 application topically daily as needed (rash).    Marland Kitchen ibuprofen (IBU) 800 MG tablet Take one tab po bid with food prn only 45 tablet 0  . pantoprazole (PROTONIX) 40 MG tablet Take 1 tablet (40 mg total) by mouth daily as needed (acid reflux). 30 tablet 5  . Na Sulfate-K Sulfate-Mg Sulf 17.5-3.13-1.6 GM/177ML SOLN Take 1 kit by mouth once for 1 dose. 354 mL 0   No current facility-administered medications for this visit.    Patient confirms/reports the following allergies:  No Known Allergies  Orders Placed This Encounter   Procedures  . Procedural/ Surgical Case Request: COLONOSCOPY WITH PROPOFOL    Standing Status:   Standing    Number of Occurrences:   1    Order Specific Question:   Pre-op diagnosis    Answer:   father colon cancer, screening colonoscopy    Order Specific Question:   CPT Code    Answer:   [id number]    AUTHORIZATION INFORMATION Primary Insurance: 1D#: Group #:  Secondary Insurance: 1D#: Group #:  SCHEDULE INFORMATION: Date: 11/30/19 Time: Location:[facility]

## 2019-11-23 ENCOUNTER — Encounter: Payer: Self-pay | Admitting: Gastroenterology

## 2019-11-23 ENCOUNTER — Other Ambulatory Visit: Payer: Self-pay

## 2019-11-28 ENCOUNTER — Other Ambulatory Visit: Payer: Self-pay

## 2019-11-28 ENCOUNTER — Other Ambulatory Visit
Admission: RE | Admit: 2019-11-28 | Discharge: 2019-11-28 | Disposition: A | Discharge status: Home / Self Care | Payer: BC Managed Care – PPO | Source: Ambulatory Visit | Attending: Gastroenterology | Admitting: Gastroenterology

## 2019-11-28 DIAGNOSIS — Z01812 Encounter for preprocedural laboratory examination: Secondary | ICD-10-CM | POA: Diagnosis not present

## 2019-11-28 DIAGNOSIS — Z20822 Contact with and (suspected) exposure to covid-19: Secondary | ICD-10-CM | POA: Diagnosis not present

## 2019-11-28 LAB — SARS CORONAVIRUS 2 (TAT 6-24 HRS): SARS Coronavirus 2: NEGATIVE

## 2019-11-29 NOTE — Discharge Instructions (Signed)
General Anesthesia, Adult, Care After This sheet gives you information about how to care for yourself after your procedure. Your health care provider may also give you more specific instructions. If you have problems or questions, contact your health care provider. What can I expect after the procedure? After the procedure, the following side effects are common:  Pain or discomfort at the IV site.  Nausea.  Vomiting.  Sore throat.  Trouble concentrating.  Feeling cold or chills.  Weak or tired.  Sleepiness and fatigue.  Soreness and body aches. These side effects can affect parts of the body that were not involved in surgery. Follow these instructions at home:  For at least 24 hours after the procedure:  Have a responsible adult stay with you. It is important to have someone help care for you until you are awake and alert.  Rest as needed.  Do not: ? Participate in activities in which you could fall or become injured. ? Drive. ? Use heavy machinery. ? Drink alcohol. ? Take sleeping pills or medicines that cause drowsiness. ? Make important decisions or sign legal documents. ? Take care of children on your own. Eating and drinking  Follow any instructions from your health care provider about eating or drinking restrictions.  When you feel hungry, start by eating small amounts of foods that are soft and easy to digest (bland), such as toast. Gradually return to your regular diet.  Drink enough fluid to keep your urine pale yellow.  If you vomit, rehydrate by drinking water, juice, or clear broth. General instructions  If you have sleep apnea, surgery and certain medicines can increase your risk for breathing problems. Follow instructions from your health care provider about wearing your sleep device: ? Anytime you are sleeping, including during daytime naps. ? While taking prescription pain medicines, sleeping medicines, or medicines that make you drowsy.  Return to  your normal activities as told by your health care provider. Ask your health care provider what activities are safe for you.  Take over-the-counter and prescription medicines only as told by your health care provider.  If you smoke, do not smoke without supervision.  Keep all follow-up visits as told by your health care provider. This is important. Contact a health care provider if:  You have nausea or vomiting that does not get better with medicine.  You cannot eat or drink without vomiting.  You have pain that does not get better with medicine.  You are unable to pass urine.  You develop a skin rash.  You have a fever.  You have redness around your IV site that gets worse. Get help right away if:  You have difficulty breathing.  You have chest pain.  You have blood in your urine or stool, or you vomit blood. Summary  After the procedure, it is common to have a sore throat or nausea. It is also common to feel tired.  Have a responsible adult stay with you for the first 24 hours after general anesthesia. It is important to have someone help care for you until you are awake and alert.  When you feel hungry, start by eating small amounts of foods that are soft and easy to digest (bland), such as toast. Gradually return to your regular diet.  Drink enough fluid to keep your urine pale yellow.  Return to your normal activities as told by your health care provider. Ask your health care provider what activities are safe for you. This information is not   intended to replace advice given to you by your health care provider. Make sure you discuss any questions you have with your health care provider. Document Revised: 01/22/2017 Document Reviewed: 09/04/2016 Elsevier Patient Education  2020 Elsevier Inc.  

## 2019-11-30 ENCOUNTER — Other Ambulatory Visit: Payer: Self-pay

## 2019-11-30 ENCOUNTER — Ambulatory Visit: Payer: BC Managed Care – PPO | Admitting: Anesthesiology

## 2019-11-30 ENCOUNTER — Ambulatory Visit
Admission: RE | Admit: 2019-11-30 | Discharge: 2019-11-30 | Disposition: A | Discharge status: Home / Self Care | Payer: BC Managed Care – PPO | Attending: Gastroenterology | Admitting: Gastroenterology

## 2019-11-30 ENCOUNTER — Encounter
Admission: RE | Disposition: A | Discharge status: Home / Self Care | Payer: Self-pay | Source: Home / Self Care | Attending: Gastroenterology

## 2019-11-30 ENCOUNTER — Encounter: Payer: Self-pay | Admitting: Gastroenterology

## 2019-11-30 DIAGNOSIS — D126 Benign neoplasm of colon, unspecified: Secondary | ICD-10-CM | POA: Diagnosis not present

## 2019-11-30 DIAGNOSIS — K635 Polyp of colon: Secondary | ICD-10-CM

## 2019-11-30 DIAGNOSIS — Z800 Family history of malignant neoplasm of digestive organs: Secondary | ICD-10-CM | POA: Diagnosis not present

## 2019-11-30 DIAGNOSIS — D1779 Benign lipomatous neoplasm of other sites: Secondary | ICD-10-CM | POA: Insufficient documentation

## 2019-11-30 DIAGNOSIS — K621 Rectal polyp: Secondary | ICD-10-CM | POA: Insufficient documentation

## 2019-11-30 DIAGNOSIS — Z1211 Encounter for screening for malignant neoplasm of colon: Secondary | ICD-10-CM | POA: Insufficient documentation

## 2019-11-30 HISTORY — PX: COLONOSCOPY WITH PROPOFOL: SHX5780

## 2019-11-30 HISTORY — PX: POLYPECTOMY: SHX5525

## 2019-11-30 SURGERY — COLONOSCOPY WITH PROPOFOL
Anesthesia: General | Site: Rectum

## 2019-11-30 MED ORDER — ACETAMINOPHEN 160 MG/5ML PO SOLN: 325.0000 mg | Freq: Once | ORAL | Status: DC

## 2019-11-30 MED ORDER — ACETAMINOPHEN 325 MG PO TABS: 325.0000 mg | ORAL_TABLET | Freq: Once | ORAL | Status: DC

## 2019-11-30 MED ORDER — SODIUM CHLORIDE 0.9 % IV SOLN: INTRAVENOUS | Status: DC

## 2019-11-30 MED ORDER — PROPOFOL 10 MG/ML IV BOLUS
INTRAVENOUS | Status: DC | PRN
  Administered 2019-11-30 (×4): 30 mg via INTRAVENOUS
  Administered 2019-11-30: 50 mg via INTRAVENOUS
  Administered 2019-11-30: 30 mg via INTRAVENOUS
  Administered 2019-11-30: 20 mg via INTRAVENOUS
  Administered 2019-11-30 (×2): 30 mg via INTRAVENOUS
  Administered 2019-11-30: 150 mg via INTRAVENOUS
  Administered 2019-11-30: 30 mg via INTRAVENOUS
  Administered 2019-11-30: 40 mg via INTRAVENOUS

## 2019-11-30 MED ORDER — LACTATED RINGERS IV SOLN: INTRAVENOUS | Status: DC

## 2019-11-30 MED ORDER — LIDOCAINE HCL (CARDIAC) PF 100 MG/5ML IV SOSY
PREFILLED_SYRINGE | INTRAVENOUS | Status: DC | PRN
  Administered 2019-11-30: 50 mg via INTRAVENOUS

## 2019-11-30 SURGICAL SUPPLY — 11 items
GOWN CVR UNV OPN BCK APRN NK (MISCELLANEOUS) ×4 IMPLANT
GOWN ISOL THUMB LOOP REG UNIV (MISCELLANEOUS) ×6
KIT PRC NS LF DISP ENDO (KITS) ×2 IMPLANT
KIT PROCEDURE OLYMPUS (KITS) ×3
MANIFOLD NEPTUNE II (INSTRUMENTS) ×3 IMPLANT
RETRIEVER NET PLAT FOOD (MISCELLANEOUS) ×3 IMPLANT
RETRIEVER NET ROTH 2.5X230 LF (MISCELLANEOUS) ×3 IMPLANT
SNARE COLD EXACTO (MISCELLANEOUS) ×3 IMPLANT
SNARE LASSO HEX 3 IN 1 (INSTRUMENTS) ×3 IMPLANT
TRAP ETRAP POLY (MISCELLANEOUS) ×3 IMPLANT
WATER STERILE IRR 250ML POUR (IV SOLUTION) ×3 IMPLANT

## 2019-11-30 NOTE — Transfer of Care (Signed)
Immediate Anesthesia Transfer of Care Note  Patient: Evan Sullivan  Procedure(s) Performed: COLONOSCOPY WITH PROPOFOL (N/A Rectum) POLYPECTOMY (Rectum)  Patient Location: PACU  Anesthesia Type: General  Level of Consciousness: awake, alert  and patient cooperative  Airway and Oxygen Therapy: Patient Spontanous Breathing and Patient connected to supplemental oxygen  Post-op Assessment: Post-op Vital signs reviewed, Patient's Cardiovascular Status Stable, Respiratory Function Stable, Patent Airway and No signs of Nausea or vomiting  Post-op Vital Signs: Reviewed and stable  Complications: No complications documented.

## 2019-11-30 NOTE — Op Note (Signed)
[facility] Gastroenterology Patient Name: Evan Sullivan Procedure Date: 11/30/2019 11:50 AM MRN: [id number] Account #: [id number] Date of Birth: 10-12-1970 Admit Type: Outpatient Age: 49 Room: [facility] OR ROOM 01 Gender: Male Note Status: Finalized Procedure:             Colonoscopy Indications:           Screening in patient at increased risk: Colorectal                         cancer in father before age 52 Providers:             Raechal Raben Raeanne Gathers MD, MD Referring MD:          Lavera Guise, MD (Referring MD) Medicines:             General Anesthesia Complications:         No immediate complications. Estimated blood loss: None. Procedure:             Pre-Anesthesia Assessment:                        - Prior to the procedure, a History and Physical was                         performed, and patient medications and allergies were                         reviewed. The patient is competent. The risks and                         benefits of the procedure and the sedation options and                         risks were discussed with the patient. All questions                         were answered and informed consent was obtained.                         Patient identification and proposed procedure were                         verified by the physician, the nurse, the                         anesthesiologist, the anesthetist and the technician                         in the pre-procedure area in the procedure room in the                         endoscopy suite. Mental Status Examination: alert and                         oriented. Airway Examination: normal oropharyngeal                         airway and neck mobility. Respiratory Examination:  clear to auscultation. CV Examination: normal.                         Prophylactic Antibiotics: The patient does not require                         prophylactic antibiotics. Prior Anticoagulants: The                          patient has taken no previous anticoagulant or                         antiplatelet agents. ASA Grade Assessment: II - A                         patient with mild systemic disease. After reviewing                         the risks and benefits, the patient was deemed in                         satisfactory condition to undergo the procedure. The                         anesthesia plan was to use general anesthesia.                         Immediately prior to administration of medications,                         the patient was re-assessed for adequacy to receive                         sedatives. The heart rate, respiratory rate, oxygen                         saturations, blood pressure, adequacy of pulmonary                         ventilation, and response to care were monitored                         throughout the procedure. The physical status of the                         patient was re-assessed after the procedure.                        After obtaining informed consent, the colonoscope was                         passed under direct vision. Throughout the procedure,                         the patient's blood pressure, pulse, and oxygen                         saturations were monitored continuously. The was  introduced through the anus and advanced to the the                         cecum, identified by appendiceal orifice and ileocecal                         valve. The colonoscopy was performed without                         difficulty. The patient tolerated the procedure well.                         The quality of the bowel preparation was evaluated                         using the BBPS [facility] Bowel Preparation Scale) with                         scores of: Right Colon = 3, Transverse Colon = 3 and                         Left Colon = 3 (entire mucosa seen well with no                         residual staining, small fragments of  stool or opaque                         liquid). The total BBPS score equals 9. Findings:      The perianal and digital rectal examinations were normal. Pertinent       negatives include normal sphincter tone and no palpable rectal lesions.      A 15 mm polyp was found in the ascending colon. The polyp was sessile.       The polyp was removed with a hot snare. Resection and retrieval were       complete. To prevent bleeding after the polypectomy, one hemostatic clip       was successfully placed (MR conditional). There was no bleeding during,       or at the end, of the procedure.      Two sessile polyps were found in the rectum. The polyps were 3 to 5 mm       in size. These polyps were removed with a cold snare. Resection and       retrieval were complete.      The exam was otherwise without abnormality.      The retroflexed view of the distal rectum and anal verge was normal and       showed no anal or rectal abnormalities. Impression:            - One 15 mm polyp in the ascending colon, removed with                         a hot snare. Resected and retrieved. Clip (MR                         conditional) was placed.                        -  Two 3 to 5 mm polyps in the rectum, removed with a                         cold snare. Resected and retrieved.                        - The examination was otherwise normal.                        - The distal rectum and anal verge are normal on                         retroflexion view. Recommendation:        - Discharge patient to home (with escort).                        - Resume previous diet today.                        - Continue present medications.                        - Await pathology results.                        - Repeat colonoscopy in 3 years for surveillance. Procedure Code(s):     --- Professional ---                        [phone], Colonoscopy, flexible; with removal of                         tumor(s), polyp(s), or other  lesion(s) by snare                         technique Diagnosis Code(s):     --- Professional ---                        Z80.0, Family history of malignant neoplasm of                         digestive organs                        K63.5, Polyp of colon                        K62.1, Rectal polyp CPT copyright 2019 American Medical Association. All rights reserved. The codes documented in this report are preliminary and upon coder review may  be revised to meet current compliance requirements. Dr. Ulyess Mort Lin Landsman MD, MD 11/30/2019 12:33:10 PM This report has been signed electronically. Number of Addenda: 0 Note Initiated On: 11/30/2019 11:50 AM Scope Withdrawal Time: 0 hours 23 minutes 57 seconds  Total Procedure Duration: 0 hours 25 minutes 58 seconds  Estimated Blood Loss:  Estimated blood loss: none.      [facility]

## 2019-11-30 NOTE — Anesthesia Preprocedure Evaluation (Signed)
Anesthesia Evaluation  Patient identified by MRN, date of birth, ID band Patient awake    Reviewed: Allergy & Precautions, H&P , NPO status , Patient's Chart, lab work & pertinent test results  Airway Mallampati: II  TM Distance: >3 FB Neck ROM: full    Dental  (+) Poor Dentition, Missing   Pulmonary    Pulmonary exam normal breath sounds clear to auscultation       Cardiovascular hypertension, Normal cardiovascular exam Rhythm:regular Rate:Normal     Neuro/Psych    GI/Hepatic GERD  ,  Endo/Other    Renal/GU      Musculoskeletal   Abdominal   Peds  Hematology   Anesthesia Other Findings   Reproductive/Obstetrics                             Anesthesia Physical Anesthesia Plan  ASA: II  Anesthesia Plan: General   Post-op Pain Management:    Induction: Intravenous  PONV Risk Score and Plan: 2 and Treatment may vary due to age or medical condition, TIVA and Propofol infusion  Airway Management Planned: Natural Airway  Additional Equipment:   Intra-op Plan:   Post-operative Plan:   Informed Consent: I have reviewed the patients History and Physical, chart, labs and discussed the procedure including the risks, benefits and alternatives for the proposed anesthesia with the patient or authorized representative who has indicated his/her understanding and acceptance.     Dental Advisory Given  Plan Discussed with: CRNA  Anesthesia Plan Comments:         Anesthesia Quick Evaluation

## 2019-11-30 NOTE — H&P (Signed)
Evan Darby, MD [address]  [place]  [place], [place] [address]  Main: [phone]  Fax: [phone] Pager: [phone]  Primary Care Physician:  [name], NP Primary Gastroenterologist:  Evan [place]  Pre-Procedure History & Physical: HPI:  Evan [place] is a 49 y.o. male is here for an colonoscopy.   Past Medical History:  Diagnosis Date  . Arthritis   . Avascular necrosis of femoral head, right ([place]) 2019  . GERD (gastroesophageal reflux disease)   . Gout   . Hypertension   . Peptic ulcer disease     Past Surgical History:  Procedure Laterality Date  . COLONOSCOPY    . KNEE ARTHROSCOPY, MEDIAL PATELLO FEMORAL LIGAMENT REPAIR Bilateral 2004,2000  . TOTAL HIP ARTHROPLASTY Right 08/02/2017   Procedure: TOTAL HIP ARTHROPLASTY;  Surgeon: Dereck Leep, MD;  Location: [facility] ORS;  Service: Orthopedics;  Laterality: Right;    Prior to Admission medications   Medication Sig Start Date End Date Taking? Authorizing Provider  amLODipine (NORVASC) 10 MG tablet Take 1 tablet (10 mg total) by mouth daily. 09/18/19  Yes Boscia, Greer Ee, NP  atenolol (TENORMIN) 100 MG tablet Take 1 tablet (100 mg total) by mouth daily. 10/16/19  Yes [name], NP  Colchicine 0.6 MG CAPS Take 1 capsule by mouth daily as needed (gout pain). 06/29/19  Yes Boscia, Greer Ee, NP  fenofibrate (TRICOR) 48 MG tablet TAKE 1 TABLET BY MOUTH EVERY DAY 08/03/19  Yes Scarboro, Audie Clear, NP  hydrochlorothiazide (HYDRODIURIL) 25 MG tablet Take 1 tablet (25 mg total) by mouth daily. 10/16/19  Yes Boscia, Greer Ee, NP  hydrocortisone cream 1 % Apply 1 application topically daily as needed (rash).   Yes [provider]  ibuprofen (IBU) 800 MG tablet Take one tab po bid with food prn only 10/25/19  Yes Lavera Guise, MD  pantoprazole (PROTONIX) 40 MG tablet Take 1 tablet (40 mg total) by mouth daily as needed (acid reflux). 09/15/17  Yes [name], NP    Allergies as of  11/13/2019  . (No Known Allergies)    Family History  Problem Relation Age of Onset  . Hypertension Mother   . Hypertension Father   . Colon cancer Father     Social History   Socioeconomic History  . Marital status: Single    Spouse name: Not on file  . Number of children: Not on file  . Years of education: Not on file  . Highest education level: Not on file  Occupational History  . Not on file  Tobacco Use  . Smoking status: Never Smoker  . Smokeless tobacco: Never Used  Vaping Use  . Vaping Use: Never used  Substance and Sexual Activity  . Alcohol use: Yes    Alcohol/week: 18.0 standard drinks    Types: 18 Cans of beer per week    Comment: 3 cans of beer most days  . Drug use: Yes    Types: Marijuana  . Sexual activity: Yes    Birth control/protection: Condom  Other Topics Concern  . Not on file  Social History Narrative  . Not on file   Social Determinants of Health   Financial Resource Strain:   . Difficulty of Paying Living Expenses: Not on file  Food Insecurity:   . Worried About Charity fundraiser in the Last Year: Not on file  . Ran Out of Food in the Last Year: Not on file  Transportation  Needs:   . Lack of Transportation (Medical): Not on file  . Lack of Transportation (Non-Medical): Not on file  Physical Activity:   . Days of Exercise per Week: Not on file  . Minutes of Exercise per Session: Not on file  Stress:   . Feeling of Stress : Not on file  Social Connections:   . Frequency of Communication with Friends and Family: Not on file  . Frequency of Social Gatherings with Friends and Family: Not on file  . Attends Religious Services: Not on file  . Active Member of Clubs or Organizations: Not on file  . Attends Archivist Meetings: Not on file  . Marital Status: Not on file  Intimate Partner Violence:   . Fear of Current or Ex-Partner: Not on file  . Emotionally Abused: Not on file  . Physically Abused: Not on file  . Sexually  Abused: Not on file    Review of Systems: See HPI, otherwise negative ROS  Physical Exam: BP (!) 140/97   Pulse (!) 58   Temp (!) 97.2 F (36.2 C) (Temporal)   Resp 16   Ht 6\' 1"  (1.854 m)   Wt 99.6 kg   SpO2 100%   BMI 28.96 kg/m  General:   Alert,  pleasant and cooperative in NAD Head:  Normocephalic and atraumatic. Neck:  Supple; no masses or thyromegaly. Lungs:  Clear throughout to auscultation.    Heart:  Regular rate and rhythm. Abdomen:  Soft, nontender and nondistended. Normal bowel sounds, without guarding, and without rebound.   Neurologic:  Alert and  oriented x4;  grossly normal neurologically.  Impression/Plan: [name] is here for an colonoscopy to be performed for familiy history of colon cancer  Risks, benefits, limitations, and alternatives regarding  colonoscopy have been reviewed with the patient.  Questions have been answered.  All parties agreeable.   Sherri Sear, MD  11/30/2019, 10:15 AM

## 2019-11-30 NOTE — Anesthesia Postprocedure Evaluation (Signed)
Anesthesia Post Note  Patient: Evan [place]  Procedure(s) Performed: COLONOSCOPY WITH PROPOFOL (N/A Rectum) POLYPECTOMY (Rectum)     Patient location during evaluation: PACU Anesthesia Type: General Level of consciousness: awake and alert and oriented Pain management: satisfactory to patient Vital Signs Assessment: post-procedure vital signs reviewed and stable Respiratory status: spontaneous breathing, nonlabored ventilation and respiratory function stable Cardiovascular status: blood pressure returned to baseline and stable Postop Assessment: Adequate PO intake and No signs of nausea or vomiting Anesthetic complications: no   No complications documented.  Raliegh Ip

## 2019-12-01 ENCOUNTER — Encounter: Payer: Self-pay | Admitting: Gastroenterology

## 2019-12-01 LAB — SURGICAL PATHOLOGY

## 2019-12-04 ENCOUNTER — Encounter: Payer: Self-pay | Admitting: Gastroenterology

## 2019-12-21 ENCOUNTER — Other Ambulatory Visit: Payer: Self-pay | Admitting: Internal Medicine

## 2019-12-21 DIAGNOSIS — M25562 Pain in left knee: Secondary | ICD-10-CM

## 2020-01-08 ENCOUNTER — Other Ambulatory Visit: Payer: Self-pay

## 2020-01-08 DIAGNOSIS — I10 Essential (primary) hypertension: Secondary | ICD-10-CM

## 2020-01-08 MED ORDER — ATENOLOL 100 MG PO TABS: 100.0000 mg | ORAL_TABLET | Freq: Every day | ORAL | 0 refills | Status: DC

## 2020-01-08 MED ORDER — HYDROCHLOROTHIAZIDE 25 MG PO TABS: 25.0000 mg | ORAL_TABLET | Freq: Every day | ORAL | 0 refills | Status: DC

## 2020-01-11 ENCOUNTER — Encounter: Payer: BC Managed Care – PPO | Admitting: Hospice and Palliative Medicine

## 2020-01-16 ENCOUNTER — Telehealth: Payer: Self-pay

## 2020-01-16 ENCOUNTER — Other Ambulatory Visit: Payer: Self-pay

## 2020-01-16 DIAGNOSIS — M25562 Pain in left knee: Secondary | ICD-10-CM

## 2020-01-16 MED ORDER — IBUPROFEN 800 MG PO TABS
ORAL_TABLET | ORAL | 1 refills | Status: DC
Start: 2020-01-16 — End: 2020-08-22

## 2020-01-16 MED ORDER — IBUPROFEN 800 MG PO TABS
ORAL_TABLET | ORAL | 1 refills | Status: DC
Start: 2020-01-16 — End: 2020-01-16

## 2020-01-16 NOTE — Telephone Encounter (Signed)
Pt was last seen on 10/25/19. Evan [place] [birth year] called and advised he hasn't been able to work this week because of his right knee pain, has had bad swelling.  States he was taken off the Ibuprofen 800 mg and is asking is there something else he can take to help.  [facility].  Call back [phone]

## 2020-01-16 NOTE — Telephone Encounter (Signed)
I haven't seen this patient in over a year. He has most recently seen Dr. Humphrey Rolls. I'm not sure how she feels about this. She may want him to be seen.

## 2020-01-16 NOTE — Telephone Encounter (Signed)
Sent ortho referral for him, he can ibuprofen in the meantime

## 2020-01-16 NOTE — Telephone Encounter (Signed)
Sent in ibuprofen to pharmacy and informed pt of referral and rx

## 2020-01-18 NOTE — Telephone Encounter (Signed)
He has seen ortho before, you should be able to ask where he wants to go and he should be able to call himself without a referral, he didn't answer my call probably bc I am calling from a private number

## 2020-01-30 DIAGNOSIS — Z125 Encounter for screening for malignant neoplasm of prostate: Secondary | ICD-10-CM | POA: Diagnosis not present

## 2020-01-31 ENCOUNTER — Other Ambulatory Visit: Payer: Self-pay

## 2020-01-31 ENCOUNTER — Encounter: Payer: Self-pay | Admitting: Hospice and Palliative Medicine

## 2020-01-31 ENCOUNTER — Ambulatory Visit (INDEPENDENT_AMBULATORY_CARE_PROVIDER_SITE_OTHER): Payer: BC Managed Care – PPO | Admitting: Hospice and Palliative Medicine

## 2020-01-31 VITALS — BP 147/90 | HR 64 | Temp 98.1°F | Resp 16 | Ht 73.0 in | Wt 224.6 lb

## 2020-01-31 DIAGNOSIS — R300 Dysuria: Secondary | ICD-10-CM

## 2020-01-31 DIAGNOSIS — E1165 Type 2 diabetes mellitus with hyperglycemia: Secondary | ICD-10-CM

## 2020-01-31 DIAGNOSIS — Z0001 Encounter for general adult medical examination with abnormal findings: Secondary | ICD-10-CM

## 2020-01-31 DIAGNOSIS — I10 Essential (primary) hypertension: Secondary | ICD-10-CM | POA: Diagnosis not present

## 2020-01-31 DIAGNOSIS — R809 Proteinuria, unspecified: Secondary | ICD-10-CM | POA: Diagnosis not present

## 2020-01-31 LAB — POCT GLYCOSYLATED HEMOGLOBIN (HGB A1C): Hemoglobin A1C: 6.7 % — AB (ref 4.0–5.6)

## 2020-01-31 LAB — PSA: Prostate Specific Ag, Serum: 0.4 ng/mL (ref 0.0–4.0)

## 2020-01-31 MED ORDER — DAPAGLIFLOZIN PROPANEDIOL 5 MG PO TABS: 5.0000 mg | ORAL_TABLET | Freq: Every day | ORAL | 3 refills | Status: DC

## 2020-01-31 NOTE — Progress Notes (Signed)
[facility] [address] [place], [place] [address]  Internal MEDICINE  Office Visit Note  Patient Name: Evan [place]  [id number]  [id number]  Date of Service: 02/04/2020  Chief Complaint  Patient presents with  . Annual Exam     HPI Pt is here for routine health maintenance examination  Drinks about a 6 pack of beer 3-4 days out of the week Only sleeps about 4 hours of night due to his work schedule--no reports of snoring, says his body has only ever allowed hik to sleep a few hours at night  No further episodes of blood in semen Had colonoscopy in October--repeat in 3 years due to multiple polyps being removed Labs were not collected correctly and need to be reordered Discussed recent urine results and there being evidence of protein in urine--further review of labs from earlier this year glucose levels elevated,, has never had A1C testing Not currently taking ibuprofen--was only taking for short course Strong history of diabetes in his family  Discussed his elevated BP--he has not yet had his medication for today   Current Medication: Outpatient Encounter Medications as of 01/31/2020  Medication Sig  . amLODipine (NORVASC) 10 MG tablet Take 1 tablet (10 mg total) by mouth daily.  Marland Kitchen atenolol (TENORMIN) 100 MG tablet Take 1 tablet (100 mg total) by mouth daily.  . Colchicine 0.6 MG CAPS Take 1 capsule by mouth daily as needed (gout pain).  . dapagliflozin propanediol (FARXIGA) 5 MG TABS tablet Take 1 tablet (5 mg total) by mouth daily before breakfast.  . fenofibrate (TRICOR) 48 MG tablet TAKE 1 TABLET BY MOUTH EVERY DAY  . hydrochlorothiazide (HYDRODIURIL) 25 MG tablet Take 1 tablet (25 mg total) by mouth daily.  . hydrocortisone cream 1 % Apply 1 application topically daily as needed (rash).  Marland Kitchen ibuprofen (ADVIL) 800 MG tablet Take 800 mg by mouth in the morning and at bedtime.  . pantoprazole (PROTONIX) 40 MG tablet Take 1 tablet (40 mg total) by mouth daily  as needed (acid reflux).   No facility-administered encounter medications on file as of 01/31/2020.    Surgical History: Past Surgical History:  Procedure Laterality Date  . COLONOSCOPY    . COLONOSCOPY WITH PROPOFOL N/A 11/30/2019   Procedure: COLONOSCOPY WITH PROPOFOL;  Surgeon: Toney Reil, MD;  Location: [facility];  Service: Endoscopy;  Laterality: N/A;  priority 4  . KNEE ARTHROSCOPY, MEDIAL PATELLO FEMORAL LIGAMENT REPAIR Bilateral 2004,2000  . POLYPECTOMY  11/30/2019   Procedure: POLYPECTOMY;  Surgeon: Toney Reil, MD;  Location: [facility];  Service: Endoscopy;;  . TOTAL HIP ARTHROPLASTY Right 08/02/2017   Procedure: TOTAL HIP ARTHROPLASTY;  Surgeon: Donato Heinz, MD;  Location: [facility];  Service: Orthopedics;  Laterality: Right;    Medical History: Past Medical History:  Diagnosis Date  . Arthritis   . Avascular necrosis of femoral head, right ([facility]) 2019  . GERD (gastroesophageal reflux disease)   . Gout   . Hypertension   . Peptic ulcer disease     Family History: Family History  Problem Relation Age of Onset  . Hypertension Mother   . Hypertension Father   . Colon cancer Father       Review of Systems  Constitutional: Negative for chills, fatigue and unexpected weight change.  HENT: Negative for congestion, postnasal drip, rhinorrhea, sneezing and sore throat.   Eyes: Negative for photophobia, redness and visual disturbance.  Respiratory: Negative for cough, chest tightness and shortness of  breath.   Cardiovascular: Negative for chest pain, palpitations and leg swelling.  Gastrointestinal: Negative for abdominal pain, constipation, diarrhea, nausea and vomiting.  Endocrine: Negative for polydipsia, polyphagia and polyuria.  Genitourinary: Negative for dysuria and frequency.  Musculoskeletal: Negative for arthralgias, back pain, joint swelling and neck pain.  Skin: Negative for rash.  Neurological: Negative for tremors  and numbness.  Hematological: Negative for adenopathy. Does not bruise/bleed easily.  Psychiatric/Behavioral: Negative for behavioral problems (Depression), sleep disturbance and suicidal ideas. The patient is not nervous/anxious.      Vital Signs: BP (!) 147/90   Pulse 64   Temp 98.1 F (36.7 C)   Resp 16   Ht 6\' 1"  (1.854 m)   Wt 224 lb 9.6 oz (101.9 kg)   SpO2 99%   BMI 29.63 kg/m    Physical Exam Vitals reviewed.  Constitutional:      Appearance: Normal appearance. He is normal weight.  HENT:     Right Ear: Tympanic membrane normal.     Left Ear: Tympanic membrane normal.     Nose: Nose normal.     Mouth/Throat:     Mouth: Mucous membranes are moist.     Pharynx: Oropharynx is clear.  Eyes:     Pupils: Pupils are equal, round, and reactive to light.  Cardiovascular:     Rate and Rhythm: Normal rate and regular rhythm.     Pulses: Normal pulses.     Heart sounds: Normal heart sounds.  Pulmonary:     Effort: Pulmonary effort is normal.     Breath sounds: Normal breath sounds.  Abdominal:     General: Abdomen is flat. Bowel sounds are normal.  Musculoskeletal:        General: Normal range of motion.     Cervical back: Normal range of motion.  Skin:    General: Skin is warm.  Neurological:     General: No focal deficit present.     Mental Status: He is alert and oriented to person, place, and time. Mental status is at baseline.  Psychiatric:        Mood and Affect: Mood normal.        Behavior: Behavior normal.        Thought Content: Thought content normal.        Judgment: Judgment normal.      LABS: Recent Results (from the past 2160 hour(s))  SARS CORONAVIRUS 2 (TAT 6-24 HRS) Nasopharyngeal Nasopharyngeal Swab     Status: None   Collection Time: 11/28/19 12:33 PM   Specimen: Nasopharyngeal Swab  Result Value Ref Range   SARS Coronavirus 2 NEGATIVE NEGATIVE    Comment: (NOTE) SARS-CoV-2 target nucleic acids are NOT DETECTED.  The SARS-CoV-2 RNA  is generally detectable in upper and lower respiratory specimens during the acute phase of infection. Negative results do not preclude SARS-CoV-2 infection, do not rule out co-infections with other pathogens, and should not be used as the sole basis for treatment or other patient management decisions. Negative results must be combined with clinical observations, patient history, and epidemiological information. The expected result is Negative.  Fact Sheet for Patients: SugarRoll.be  Fact Sheet for Healthcare Providers: https://www.woods-mathews.com/  This test is not yet approved or cleared by the [place] FDA and  has been authorized for detection and/or diagnosis of SARS-CoV-2 by FDA under an Emergency Use Authorization (EUA). This EUA will remain  in effect (meaning this test can be used) for the duration of the COVID-19 declaration under  Se ction 564(b)(1) of the Act, 21 U.S.C. section 360bbb-3(b)(1), unless the authorization is terminated or revoked sooner.  Performed at [facility], [address] [place], [place] [address]   Surgical pathology     Status: None   Collection Time: 11/30/19 10:30 AM  Result Value Ref Range   SURGICAL PATHOLOGY      SURGICAL PATHOLOGY CASE: [id number] PATIENT: Elisah Dever Surgical Pathology Report     Specimen Submitted: A. Colon polyp, ascending; hot snare B. Rectum polyp; cold snare  Clinical History: Father colon cancer, screening colonoscopy. Colon polyps.      DIAGNOSIS: A. COLON POLYP, ASCENDING; HOT SNARE: - COLONIC MUCOSA WITH SUBMUCOSAL LIPOMA. - NEGATIVE FOR DYSPLASIA AND MALIGNANCY.  B.  RECTUM POLYP; COLD SNARE: - HYPERPLASTIC POLYP. - NEGATIVE FOR DYSPLASIA AND MALIGNANCY.  GROSS DESCRIPTION: A. Labeled: Ascending colon polyp hot snare Received: In formalin Tissue fragment(s): 2 Size: 1.2 x 1.1 x 0.3 cm and 1.4 x 1.3 x 0.6 cm Description: 2 pink-red  pedunculated polypoid fragments, probable base is inked, serially sectioned Entirely submitted in 1 cassette.  B. Labeled: Rectal polyp-cold snare Received: In formalin Tissue fragment(s): Multiple Size: 0.5 x 0.4 x 0.2 cm Description: Aggregate of tan polypoid fragments Entirely submitted in 1 c assette.   Final Diagnosis performed by Elijah Birkara Rubinas, MD.   Electronically signed 12/01/2019 11:33:58AM The electronic signature indicates that the named Attending Pathologist has evaluated the specimen Technical component performed at [facility], [address], [place], [place] [address] Lab: [phone] Dir: Jolene SchimkeSanjai Nagendra, MD, MMM  Professional component performed at [facility], [facility], [address], [place], [place] [address] Lab: [phone] Dir: Georgiann Cockerara C. Oneita Krasubinas, MD   PSA     Status: None   Collection Time: 01/30/20  9:33 AM  Result Value Ref Range   Prostate Specific Ag, Serum 0.4 0.0 - 4.0 ng/mL    Comment: Roche ECLIA methodology. According to the American Urological Association, Serum PSA should decrease and remain at undetectable levels after radical prostatectomy. The AUA defines biochemical recurrence as an initial PSA value 0.2 ng/mL or greater followed by a subsequent confirmatory PSA value 0.2 ng/mL or greater. Values obtained with different assay methods or kits cannot be used interchangeably. Results cannot be interpreted as absolute evidence of the presence or absence of malignant disease.   UA/M w/rflx Culture, Routine     Status: Abnormal   Collection Time: 01/31/20 11:28 AM   Specimen: Urine   Urine  Result Value Ref Range   Specific Gravity, UA 1.017 1.005 - 1.030   pH, UA 5.0 5.0 - 7.5   Color, UA Yellow Yellow   Appearance Ur Clear Clear   Leukocytes,UA Negative Negative   Protein,UA 1+ (A) Negative/Trace   Glucose, UA Negative Negative   Ketones, UA Negative Negative   RBC, UA Negative Negative   Bilirubin, UA Negative Negative    Urobilinogen, Ur 0.2 0.2 - 1.0 mg/dL   Nitrite, UA Negative Negative   Microscopic Examination See below:     Comment: Microscopic was indicated and was performed.   Urinalysis Reflex Comment     Comment: This specimen will not reflex to a Urine Culture.  Microscopic Examination     Status: None   Collection Time: 01/31/20 11:28 AM   Urine  Result Value Ref Range   WBC, UA 0-5 0 - 5 /hpf   RBC None seen 0 - 2 /hpf   Epithelial Cells (non renal) None seen 0 - 10 /hpf  Casts None seen None seen /lpf   Bacteria, UA None seen None seen/Few  POCT HgB A1C     Status: Abnormal   Collection Time: 01/31/20 11:51 AM  Result Value Ref Range   Hemoglobin A1C 6.7 (A) 4.0 - 5.6 %   HbA1c POC (<> result, manual entry)     HbA1c, POC (prediabetic range)     HbA1c, POC (controlled diabetic range)      Assessment/Plan: 1. Encounter for routine adult health examination with abnormal findings Well appearing 49 year old AA male Further labs need to be collected, reordered labs Up to date on PHM, will need repeat colonoscopy in 3 years - CBC w/Diff/Platelet - Comprehensive Metabolic Panel (CMET) - Lipid Panel With LDL/HDL Ratio - TSH + free T4  2. Type 2 diabetes mellitus with hyperglycemia, without long-term current use of insulin (HCC) A1C 6.7, discussed that elevated A1C classifies him as being diabetic, will start low dose Farxiga and recheck in 3 months  - POCT HgB A1C - dapagliflozin propanediol (FARXIGA) 5 MG TABS tablet; Take 1 tablet (5 mg total) by mouth daily before breakfast.  Dispense: 30 tablet; Refill: 3  3. Essential hypertension BP elevated today--discussed importance of tighter control of BP, he will work on better medication adherence  4. Proteinuria, unspecified type Likely due to mew onset diabetes and possible uncontrolled hypertension, will closely monitor  5. Dysuria - UA/M w/rflx Culture, Routine - Microscopic Examination  General Counseling: [place] verbalizes  understanding of the findings of todays visit and agrees with plan of treatment. I have discussed any further diagnostic evaluation that may be needed or ordered today. We also reviewed his medications today. he has been encouraged to call the office with any questions or concerns that should arise related to todays visit.    Counseling:    Orders Placed This Encounter  Procedures  . Microscopic Examination  . UA/M w/rflx Culture, Routine  . CBC w/Diff/Platelet  . Comprehensive Metabolic Panel (CMET)  . Lipid Panel With LDL/HDL Ratio  . TSH + free T4  . POCT HgB A1C    Meds ordered this encounter  Medications  . dapagliflozin propanediol (FARXIGA) 5 MG TABS tablet    Sig: Take 1 tablet (5 mg total) by mouth daily before breakfast.    Dispense:  30 tablet    Refill:  3    Total time spent: 30 Minutes  Time spent includes review of chart, medications, test results, and follow up plan with the patient.   This patient was seen by Brent General AGNP-C Collaboration with Dr Lyndon Code as a part of collaborative care agreement   Lubertha Basque. [facility] Internal Medicine

## 2020-01-31 NOTE — Progress Notes (Signed)
Proteinuria, needs further diagnostics

## 2020-02-01 LAB — MICROSCOPIC EXAMINATION
Bacteria, UA: NONE SEEN
Casts: NONE SEEN /lpf
Epithelial Cells (non renal): NONE SEEN /hpf (ref 0–10)
RBC, Urine: NONE SEEN /hpf (ref 0–2)

## 2020-02-01 LAB — UA/M W/RFLX CULTURE, ROUTINE
Bilirubin, UA: NEGATIVE
Glucose, UA: NEGATIVE
Ketones, UA: NEGATIVE
Leukocytes,UA: NEGATIVE
Nitrite, UA: NEGATIVE
RBC, UA: NEGATIVE
Specific Gravity, UA: 1.017 (ref 1.005–1.030)
Urobilinogen, Ur: 0.2 mg/dL (ref 0.2–1.0)
pH, UA: 5 (ref 5.0–7.5)

## 2020-02-02 ENCOUNTER — Other Ambulatory Visit: Payer: Self-pay | Admitting: Adult Health

## 2020-02-02 DIAGNOSIS — E781 Pure hyperglyceridemia: Secondary | ICD-10-CM

## 2020-02-04 ENCOUNTER — Encounter: Payer: Self-pay | Admitting: Hospice and Palliative Medicine

## 2020-02-06 ENCOUNTER — Other Ambulatory Visit: Payer: Self-pay

## 2020-02-06 DIAGNOSIS — E781 Pure hyperglyceridemia: Secondary | ICD-10-CM

## 2020-02-06 MED ORDER — FENOFIBRATE 48 MG PO TABS: 48.0000 mg | ORAL_TABLET | Freq: Every day | ORAL | 1 refills | Status: DC

## 2020-02-08 DIAGNOSIS — M1711 Unilateral primary osteoarthritis, right knee: Secondary | ICD-10-CM | POA: Diagnosis not present

## 2020-02-22 ENCOUNTER — Telehealth: Payer: Self-pay

## 2020-02-23 ENCOUNTER — Other Ambulatory Visit: Payer: Self-pay | Admitting: Hospice and Palliative Medicine

## 2020-02-23 MED ORDER — RYBELSUS 3 MG PO TABS: 3.0000 mg | ORAL_TABLET | Freq: Every day | ORAL | 2 refills | Status: DC

## 2020-02-23 NOTE — Telephone Encounter (Signed)
NOT COVERED

## 2020-02-23 NOTE — Telephone Encounter (Signed)
Spoke with pt, he will be able to pick up Rybelsus by Thursday next week, and he will stop [place].

## 2020-02-23 NOTE — Telephone Encounter (Signed)
Please call and tell him to stop [place] and I have sent a new medication to his pharmacy Rybelsus for him to start.

## 2020-02-26 NOTE — Telephone Encounter (Signed)
Please discuss this with me 

## 2020-02-27 ENCOUNTER — Telehealth: Payer: Self-pay

## 2020-02-27 MED ORDER — RYBELSUS 3 MG PO TABS: 3.0000 mg | ORAL_TABLET | Freq: Every day | ORAL | 2 refills | Status: DC

## 2020-02-27 NOTE — Telephone Encounter (Signed)
PA approved 02/27/20 on Rybelsus 3 mg tablets valid from 02/27/2020 to 02/25/2021.  Sent new prescription to pharmacy

## 2020-02-27 NOTE — Telephone Encounter (Signed)
Do PA

## 2020-03-13 ENCOUNTER — Other Ambulatory Visit: Payer: Self-pay

## 2020-03-13 DIAGNOSIS — I10 Essential (primary) hypertension: Secondary | ICD-10-CM

## 2020-03-13 MED ORDER — AMLODIPINE BESYLATE 10 MG PO TABS: 10.0000 mg | ORAL_TABLET | Freq: Every day | ORAL | 1 refills | Status: DC

## 2020-04-10 ENCOUNTER — Other Ambulatory Visit: Payer: Self-pay | Admitting: Internal Medicine

## 2020-04-10 DIAGNOSIS — I10 Essential (primary) hypertension: Secondary | ICD-10-CM

## 2020-04-30 ENCOUNTER — Ambulatory Visit: Payer: BC Managed Care – PPO | Admitting: Hospice and Palliative Medicine

## 2020-05-08 ENCOUNTER — Ambulatory Visit: Payer: BC Managed Care – PPO | Admitting: Hospice and Palliative Medicine

## 2020-05-16 ENCOUNTER — Ambulatory Visit: Payer: BC Managed Care – PPO | Admitting: Hospice and Palliative Medicine

## 2020-05-23 ENCOUNTER — Ambulatory Visit: Payer: BC Managed Care – PPO | Admitting: Hospice and Palliative Medicine

## 2020-06-10 ENCOUNTER — Other Ambulatory Visit: Payer: Self-pay | Admitting: Internal Medicine

## 2020-06-17 ENCOUNTER — Ambulatory Visit: Payer: BC Managed Care – PPO | Admitting: Physician Assistant

## 2020-06-20 ENCOUNTER — Ambulatory Visit: Payer: BC Managed Care – PPO | Admitting: Physician Assistant

## 2020-06-20 ENCOUNTER — Encounter: Payer: Self-pay | Admitting: Physician Assistant

## 2020-06-20 ENCOUNTER — Other Ambulatory Visit: Payer: Self-pay

## 2020-06-20 DIAGNOSIS — I10 Essential (primary) hypertension: Secondary | ICD-10-CM

## 2020-06-20 DIAGNOSIS — K219 Gastro-esophageal reflux disease without esophagitis: Secondary | ICD-10-CM | POA: Diagnosis not present

## 2020-06-20 DIAGNOSIS — F1010 Alcohol abuse, uncomplicated: Secondary | ICD-10-CM | POA: Diagnosis not present

## 2020-06-20 DIAGNOSIS — E1165 Type 2 diabetes mellitus with hyperglycemia: Secondary | ICD-10-CM | POA: Diagnosis not present

## 2020-06-20 DIAGNOSIS — R7303 Prediabetes: Secondary | ICD-10-CM | POA: Diagnosis not present

## 2020-06-20 DIAGNOSIS — R5383 Other fatigue: Secondary | ICD-10-CM

## 2020-06-20 LAB — POCT GLYCOSYLATED HEMOGLOBIN (HGB A1C): Hemoglobin A1C: 6.3 % — AB (ref 4.0–5.6)

## 2020-06-20 MED ORDER — COLCHICINE 0.6 MG PO CAPS
1.0000 | ORAL_CAPSULE | Freq: Every day | ORAL | 3 refills | Status: DC | PRN
Start: 2020-06-20 — End: 2020-06-26

## 2020-06-20 MED ORDER — PANTOPRAZOLE SODIUM 40 MG PO TBEC: 40.0000 mg | DELAYED_RELEASE_TABLET | Freq: Every day | ORAL | 5 refills | Status: DC | PRN

## 2020-06-20 NOTE — Progress Notes (Signed)
[facility] [place], [place] [address]  Internal MEDICINE  Office Visit Note  Patient Name: Evan Sullivan  [id number]  [id number]  Date of Service: 06/21/2020  Chief Complaint  Patient presents with  . Follow-up    A1c, med review  . Gastroesophageal Reflux  . Hyperlipidemia  . Arthritis    HPI Pt is here for routine follow up -Had done farxiga but got a rash then and had to stop. Never started on rybelsus.  -He is drinking more water now. Has been more active with warmer weather. -Not drinking alcohol every day anymore, but will drink a 12 pack on the weekend. Discussed needing to cut back on weekend use as well and the dangers of binge drinking as well as impact on liver and sugar levels. -Patient did not have labs done due to work schedule and will go now -BP stable, but patient does not check at home  Current Medication: Outpatient Encounter Medications as of 06/20/2020  Medication Sig  . amLODipine (NORVASC) 10 MG tablet Take 1 tablet (10 mg total) by mouth daily.  Marland Kitchen atenolol (TENORMIN) 100 MG tablet TAKE 1 TABLET BY MOUTH EVERY DAY  . fenofibrate (TRICOR) 48 MG tablet Take 1 tablet (48 mg total) by mouth daily.  . hydrochlorothiazide (HYDRODIURIL) 25 MG tablet TAKE 1 TABLET BY MOUTH EVERY DAY  . hydrocortisone cream 1 % Apply 1 application topically daily as needed (rash).  Marland Kitchen ibuprofen (ADVIL) 800 MG tablet Take 800 mg by mouth in the morning and at bedtime.  . Semaglutide (RYBELSUS) 3 MG TABS Take 3 mg by mouth daily.  . [DISCONTINUED] Colchicine 0.6 MG CAPS Take 1 capsule by mouth daily as needed (gout pain).  . [DISCONTINUED] pantoprazole (PROTONIX) 40 MG tablet Take 1 tablet (40 mg total) by mouth daily as needed (acid reflux).  . Colchicine 0.6 MG CAPS Take 1 capsule by mouth daily as needed (gout pain).  . pantoprazole (PROTONIX) 40 MG tablet Take 1 tablet (40 mg total) by mouth daily as needed (acid reflux).   No facility-administered  encounter medications on file as of 06/20/2020.    Surgical History: Past Surgical History:  Procedure Laterality Date  . COLONOSCOPY    . COLONOSCOPY WITH PROPOFOL N/A 11/30/2019   Procedure: COLONOSCOPY WITH PROPOFOL;  Surgeon: Lin Landsman, MD;  Location: [facility];  Service: Endoscopy;  Laterality: N/A;  priority 4  . KNEE ARTHROSCOPY, MEDIAL PATELLO FEMORAL LIGAMENT REPAIR Bilateral 2004,2000  . POLYPECTOMY  11/30/2019   Procedure: POLYPECTOMY;  Surgeon: Lin Landsman, MD;  Location: [facility];  Service: Endoscopy;;  . TOTAL HIP ARTHROPLASTY Right 08/02/2017   Procedure: TOTAL HIP ARTHROPLASTY;  Surgeon: Dereck Leep, MD;  Location: [facility];  Service: Orthopedics;  Laterality: Right;    Medical History: Past Medical History:  Diagnosis Date  . Arthritis   . Avascular necrosis of femoral head, right ([place]) 2019  . GERD (gastroesophageal reflux disease)   . Gout   . Hypertension   . Peptic ulcer disease     Family History: Family History  Problem Relation Age of Onset  . Hypertension Mother   . Hypertension Father   . Colon cancer Father   . Diabetes Father     Social History   Socioeconomic History  . Marital status: Single    Spouse name: Not on file  . Number of children: Not on file  . Years of education: Not on file  . Highest education  level: Not on file  Occupational History  . Not on file  Tobacco Use  . Smoking status: Never Smoker  . Smokeless tobacco: Never Used  Vaping Use  . Vaping Use: Never used  Substance and Sexual Activity  . Alcohol use: Yes    Alcohol/week: 14.0 standard drinks    Types: 14 Cans of beer per week    Comment: occ  . Drug use: Yes    Types: Marijuana    Comment: daily   . Sexual activity: Yes    Birth control/protection: Condom  Other Topics Concern  . Not on file  Social History Narrative  . Not on file   Social Determinants of Health   Financial Resource Strain: Not on file   Food Insecurity: Not on file  Transportation Needs: Not on file  Physical Activity: Not on file  Stress: Not on file  Social Connections: Not on file  Intimate Partner Violence: Not on file      Review of Systems  Constitutional: Negative for chills, fatigue and unexpected weight change.  HENT: Negative for congestion, postnasal drip, rhinorrhea, sneezing and sore throat.   Eyes: Negative for redness.  Respiratory: Negative for cough, chest tightness and shortness of breath.   Cardiovascular: Negative for chest pain and palpitations.  Gastrointestinal: Negative for abdominal pain, constipation, diarrhea, nausea and vomiting.  Genitourinary: Negative for dysuria and frequency.  Musculoskeletal: Negative for arthralgias, back pain, joint swelling and neck pain.  Skin: Negative for rash.  Neurological: Negative.  Negative for tremors and numbness.  Hematological: Negative for adenopathy. Does not bruise/bleed easily.  Psychiatric/Behavioral: Negative for behavioral problems (Depression), sleep disturbance and suicidal ideas. The patient is not nervous/anxious.     Vital Signs: BP 138/88   Pulse 63   Temp 98.6 F (37 C)   Resp 16   Ht 6\' 1"  (1.854 m)   Wt 215 lb 12.8 oz (97.9 kg)   SpO2 97%   BMI 28.47 kg/m    Physical Exam Vitals and nursing note reviewed.  Constitutional:      General: He is not in acute distress.    Appearance: He is well-developed. He is not diaphoretic.  HENT:     Head: Normocephalic and atraumatic.     Mouth/Throat:     Pharynx: No oropharyngeal exudate.  Eyes:     Pupils: Pupils are equal, round, and reactive to light.  Neck:     Thyroid: No thyromegaly.     Vascular: No JVD.     Trachea: No tracheal deviation.  Cardiovascular:     Rate and Rhythm: Normal rate and regular rhythm.     Heart sounds: Normal heart sounds. No murmur heard. No friction rub. No gallop.   Pulmonary:     Effort: Pulmonary effort is normal. No respiratory  distress.     Breath sounds: No wheezing or rales.  Chest:     Chest wall: No tenderness.  Abdominal:     General: Bowel sounds are normal.     Palpations: Abdomen is soft.  Musculoskeletal:        General: Normal range of motion.     Cervical back: Normal range of motion and neck supple.  Lymphadenopathy:     Cervical: No cervical adenopathy.  Skin:    General: Skin is warm and dry.  Neurological:     Mental Status: He is alert and oriented to person, place, and time.     Cranial Nerves: No cranial nerve deficit.  Psychiatric:  Behavior: Behavior normal.        Thought Content: Thought content normal.        Judgment: Judgment normal.        Assessment/Plan: 1. Type 2 diabetes mellitus with hyperglycemia, without long-term current use of insulin (HCC) - POCT HgB A1C is 6.3, will continue control with diet and exercise, consider rybelsus if worsening  2. Essential hypertension Stable, continue current therapy  3. Alcohol abuse Educated on the dangers of alcohol abuse and binge drinking, pt working on cutting back  4. Gastroesophageal reflux disease without esophagitis - pantoprazole (PROTONIX) 40 MG tablet; Take 1 tablet (40 mg total) by mouth daily as needed (acid reflux).  Dispense: 30 tablet; Refill: 5  5. Other fatigue - TSH + free T4 - Lipid Panel With LDL/HDL Ratio - Comprehensive metabolic panel - CBC w/Diff/Platelet   General Counseling: Gustavo verbalizes understanding of the findings of todays visit and agrees with plan of treatment. I have discussed any further diagnostic evaluation that may be needed or ordered today. We also reviewed his medications today. he has been encouraged to call the office with any questions or concerns that should arise related to todays visit.    Orders Placed This Encounter  Procedures  . TSH + free T4  . Lipid Panel With LDL/HDL Ratio  . Comprehensive metabolic panel  . CBC w/Diff/Platelet  . POCT HgB A1C    Meds  ordered this encounter  Medications  . pantoprazole (PROTONIX) 40 MG tablet    Sig: Take 1 tablet (40 mg total) by mouth daily as needed (acid reflux).    Dispense:  30 tablet    Refill:  5  . Colchicine 0.6 MG CAPS    Sig: Take 1 capsule by mouth daily as needed (gout pain).    Dispense:  30 capsule    Refill:  3    This patient was seen by Drema Dallas, PA-C in collaboration with Dr. Clayborn Bigness as a part of collaborative care agreement.   Total time spent:30 Minutes Time spent includes review of chart, medications, test results, and follow up plan with the patient.      Dr Lavera Guise Internal medicine

## 2020-06-24 ENCOUNTER — Other Ambulatory Visit: Payer: Self-pay | Admitting: Physician Assistant

## 2020-06-25 NOTE — Telephone Encounter (Signed)
Hey the medication you gave for gout was not covered and the PA was denied is there a different med you can give for patient?

## 2020-06-26 ENCOUNTER — Other Ambulatory Visit: Payer: Self-pay

## 2020-06-26 MED ORDER — COLCHICINE 0.6 MG PO TABS: ORAL_TABLET | ORAL | 1 refills | Status: AC

## 2020-07-06 ENCOUNTER — Other Ambulatory Visit: Payer: Self-pay | Admitting: Internal Medicine

## 2020-07-06 DIAGNOSIS — I10 Essential (primary) hypertension: Secondary | ICD-10-CM

## 2020-08-09 ENCOUNTER — Other Ambulatory Visit: Payer: Self-pay

## 2020-08-09 DIAGNOSIS — E781 Pure hyperglyceridemia: Secondary | ICD-10-CM

## 2020-08-09 MED ORDER — FENOFIBRATE 48 MG PO TABS: 48.0000 mg | ORAL_TABLET | Freq: Every day | ORAL | 1 refills | Status: DC

## 2020-08-22 ENCOUNTER — Other Ambulatory Visit: Payer: Self-pay

## 2020-08-22 MED ORDER — IBUPROFEN 800 MG PO TABS
ORAL_TABLET | ORAL | 1 refills | Status: DC
Start: 2020-08-22 — End: 2020-10-17

## 2020-09-11 ENCOUNTER — Other Ambulatory Visit: Payer: Self-pay | Admitting: Internal Medicine

## 2020-09-11 DIAGNOSIS — I10 Essential (primary) hypertension: Secondary | ICD-10-CM

## 2020-09-19 ENCOUNTER — Ambulatory Visit: Payer: BC Managed Care – PPO | Admitting: Physician Assistant

## 2020-09-26 ENCOUNTER — Ambulatory Visit: Payer: BC Managed Care – PPO | Admitting: Physician Assistant

## 2020-10-08 ENCOUNTER — Other Ambulatory Visit: Payer: Self-pay | Admitting: Internal Medicine

## 2020-10-08 DIAGNOSIS — I10 Essential (primary) hypertension: Secondary | ICD-10-CM

## 2020-10-09 DIAGNOSIS — R5383 Other fatigue: Secondary | ICD-10-CM | POA: Diagnosis not present

## 2020-10-10 LAB — COMPREHENSIVE METABOLIC PANEL
ALT: 17 IU/L (ref 0–44)
AST: 17 IU/L (ref 0–40)
Albumin/Globulin Ratio: 1.6 (ref 1.2–2.2)
Albumin: 4.6 g/dL (ref 4.0–5.0)
Alkaline Phosphatase: 53 IU/L (ref 44–121)
BUN/Creatinine Ratio: 14 (ref 9–20)
BUN: 14 mg/dL (ref 6–24)
Bilirubin Total: 0.4 mg/dL (ref 0.0–1.2)
CO2: 23 mmol/L (ref 20–29)
Calcium: 10.3 mg/dL — ABNORMAL HIGH (ref 8.7–10.2)
Chloride: 100 mmol/L (ref 96–106)
Creatinine, Ser: 0.99 mg/dL (ref 0.76–1.27)
Globulin, Total: 2.8 g/dL (ref 1.5–4.5)
Glucose: 128 mg/dL — ABNORMAL HIGH (ref 65–99)
Potassium: 4.2 mmol/L (ref 3.5–5.2)
Sodium: 138 mmol/L (ref 134–144)
Total Protein: 7.4 g/dL (ref 6.0–8.5)
eGFR: 93 mL/min/{1.73_m2} (ref >59)

## 2020-10-10 LAB — CBC WITH DIFFERENTIAL/PLATELET
Basophils Absolute: 0.1 10*3/uL (ref 0.0–0.2)
Basos: 1 %
EOS (ABSOLUTE): 0.3 10*3/uL (ref 0.0–0.4)
Eos: 6 %
Hematocrit: 44.8 % (ref 37.5–51.0)
Hemoglobin: 15.6 g/dL (ref 13.0–17.7)
Immature Grans (Abs): 0 10*3/uL (ref 0.0–0.1)
Immature Granulocytes: 1 %
Lymphocytes Absolute: 2.1 10*3/uL (ref 0.7–3.1)
Lymphs: 36 %
MCH: 31.9 pg (ref 26.6–33.0)
MCHC: 34.8 g/dL (ref 31.5–35.7)
MCV: 92 fL (ref 79–97)
Monocytes Absolute: 0.5 10*3/uL (ref 0.1–0.9)
Monocytes: 9 %
Neutrophils Absolute: 2.7 10*3/uL (ref 1.4–7.0)
Neutrophils: 47 %
Platelets: 315 10*3/uL (ref 150–450)
RBC: 4.89 x10E6/uL (ref 4.14–5.80)
RDW: 12.5 % (ref 11.6–15.4)
WBC: 5.7 10*3/uL (ref 3.4–10.8)

## 2020-10-10 LAB — LIPID PANEL WITH LDL/HDL RATIO
Cholesterol, Total: 179 mg/dL (ref 100–199)
HDL: 61 mg/dL (ref >39)
LDL Chol Calc (NIH): 83 mg/dL (ref 0–99)
LDL/HDL Ratio: 1.4 ratio (ref 0.0–3.6)
Triglycerides: 211 mg/dL — ABNORMAL HIGH (ref 0–149)
VLDL Cholesterol Cal: 35 mg/dL (ref 5–40)

## 2020-10-10 LAB — TSH+FREE T4
Free T4: 1.23 ng/dL (ref 0.82–1.77)
TSH: 2.08 u[IU]/mL (ref 0.450–4.500)

## 2020-10-17 ENCOUNTER — Other Ambulatory Visit: Payer: Self-pay

## 2020-10-17 ENCOUNTER — Ambulatory Visit: Payer: BC Managed Care – PPO | Admitting: Physician Assistant

## 2020-10-17 MED ORDER — IBUPROFEN 800 MG PO TABS: ORAL_TABLET | ORAL | 0 refills | Status: DC

## 2020-11-28 ENCOUNTER — Other Ambulatory Visit: Payer: Self-pay

## 2020-11-28 ENCOUNTER — Encounter: Payer: Self-pay | Admitting: Physician Assistant

## 2020-11-28 ENCOUNTER — Ambulatory Visit: Payer: BC Managed Care – PPO | Admitting: Physician Assistant

## 2020-11-28 VITALS — BP 140/82 | HR 71 | Temp 97.8°F | Resp 16 | Ht 73.0 in | Wt 217.0 lb

## 2020-11-28 DIAGNOSIS — I10 Essential (primary) hypertension: Secondary | ICD-10-CM | POA: Diagnosis not present

## 2020-11-28 DIAGNOSIS — E1165 Type 2 diabetes mellitus with hyperglycemia: Secondary | ICD-10-CM | POA: Diagnosis not present

## 2020-11-28 DIAGNOSIS — Z0001 Encounter for general adult medical examination with abnormal findings: Secondary | ICD-10-CM

## 2020-11-28 DIAGNOSIS — F1010 Alcohol abuse, uncomplicated: Secondary | ICD-10-CM

## 2020-11-28 LAB — POCT GLYCOSYLATED HEMOGLOBIN (HGB A1C): Hemoglobin A1C: 6.8 % — AB (ref 4.0–5.6)

## 2020-11-28 MED ORDER — RYBELSUS 3 MG PO TABS: 3.0000 mg | ORAL_TABLET | Freq: Every day | ORAL | 2 refills | Status: DC

## 2020-11-28 NOTE — Progress Notes (Signed)
[facility] [place], [place] [address]  Internal MEDICINE  Office Visit Note  Patient Name: Evan Sullivan  [id number]  [id number]  Date of Service: 12/03/2020  Chief Complaint  Patient presents with   Follow-up   Hypertension    HPI Pt is here for routine follow up -Labs overall looked good with sugar elevated and calcium just slightly elevated. May need repeat testing to monitor. -Has been eating some more pasta and sugary drinks. -has not been taking any medications since having reaction to farxiga, he never picked up the rybelsus because he thought it was the same med he reacted to previously. Will resend and start now -Not exercising lately -Sleep has been ok. Gets up 3am for work, gets to bed 9-10pm. Avg 4 hours of sleep usually. -BP at home not checked recently -Trying to limit drinking to weekends, might have 6 pack. Sometimes skips all together. Has cut back a lot.  Current Medication: Outpatient Encounter Medications as of 11/28/2020  Medication Sig   amLODipine (NORVASC) 10 MG tablet TAKE 1 TABLET BY MOUTH EVERY DAY   atenolol (TENORMIN) 100 MG tablet TAKE 1 TABLET BY MOUTH EVERY DAY   colchicine 0.6 MG tablet Take 1 capsule by mouth daily as needed (gout pain)   fenofibrate (TRICOR) 48 MG tablet Take 1 tablet (48 mg total) by mouth daily.   hydrochlorothiazide (HYDRODIURIL) 25 MG tablet TAKE 1 TABLET BY MOUTH EVERY DAY   hydrocortisone cream 1 % Apply 1 application topically daily as needed (rash).   ibuprofen (ADVIL) 800 MG tablet Take 800 mg by mouth in the morning and at bedtime prn   pantoprazole (PROTONIX) 40 MG tablet Take 1 tablet (40 mg total) by mouth daily as needed (acid reflux).   Semaglutide (RYBELSUS) 3 MG TABS Take 3 mg by mouth daily.   [DISCONTINUED] Semaglutide (RYBELSUS) 3 MG TABS Take 3 mg by mouth daily.   No facility-administered encounter medications on file as of 11/28/2020.    Surgical History: Past Surgical  History:  Procedure Laterality Date   COLONOSCOPY     COLONOSCOPY WITH PROPOFOL N/A 11/30/2019   Procedure: COLONOSCOPY WITH PROPOFOL;  Surgeon: Lin Landsman, MD;  Location: [facility];  Service: Endoscopy;  Laterality: N/A;  priority 4   KNEE ARTHROSCOPY, MEDIAL PATELLO FEMORAL LIGAMENT REPAIR Bilateral 2004,2000   POLYPECTOMY  11/30/2019   Procedure: POLYPECTOMY;  Surgeon: Lin Landsman, MD;  Location: [facility];  Service: Endoscopy;;   TOTAL HIP ARTHROPLASTY Right 08/02/2017   Procedure: TOTAL HIP ARTHROPLASTY;  Surgeon: Dereck Leep, MD;  Location: [facility];  Service: Orthopedics;  Laterality: Right;    Medical History: Past Medical History:  Diagnosis Date   Arthritis    Avascular necrosis of femoral head, right ([place]) 2019   GERD (gastroesophageal reflux disease)    Gout    Hypertension    Peptic ulcer disease     Family History: Family History  Problem Relation Age of Onset   Hypertension Mother    Hypertension Father    Colon cancer Father    Diabetes Father     Social History   Socioeconomic History   Marital status: Single    Spouse name: Not on file   Number of children: Not on file   Years of education: Not on file   Highest education level: Not on file  Occupational History   Not on file  Tobacco Use   Smoking status: Never   Smokeless  tobacco: Never  Vaping Use   Vaping Use: Never used  Substance and Sexual Activity   Alcohol use: Yes    Alcohol/week: 14.0 standard drinks    Types: 14 Cans of beer per week    Comment: occ   Drug use: Yes    Types: Marijuana    Comment: daily    Sexual activity: Yes    Birth control/protection: Condom  Other Topics Concern   Not on file  Social History Narrative   Not on file   Social Determinants of Health   Financial Resource Strain: Not on file  Food Insecurity: Not on file  Transportation Needs: Not on file  Physical Activity: Not on file  Stress: Not on file  Social  Connections: Not on file  Intimate Partner Violence: Not on file      Review of Systems  Constitutional:  Negative for chills, fatigue and unexpected weight change.  HENT:  Negative for congestion, postnasal drip, rhinorrhea, sneezing and sore throat.   Eyes:  Negative for redness.  Respiratory:  Negative for cough, chest tightness and shortness of breath.   Cardiovascular:  Negative for chest pain and palpitations.  Gastrointestinal:  Negative for abdominal pain, constipation, diarrhea, nausea and vomiting.  Genitourinary:  Negative for dysuria and frequency.  Musculoskeletal:  Negative for arthralgias, back pain, joint swelling and neck pain.  Skin:  Negative for rash.  Neurological: Negative.  Negative for tremors and numbness.  Hematological:  Negative for adenopathy. Does not bruise/bleed easily.  Psychiatric/Behavioral:  Negative for behavioral problems (Depression), sleep disturbance and suicidal ideas. The patient is not nervous/anxious.    Vital Signs: BP 140/82   Pulse 71   Temp 97.8 F (36.6 C)   Resp 16   Ht 6\' 1"  (1.854 m)   Wt 217 lb (98.4 kg)   SpO2 98%   BMI 28.63 kg/m    Physical Exam Vitals and nursing note reviewed.  Constitutional:      General: He is not in acute distress.    Appearance: He is well-developed. He is not diaphoretic.  HENT:     Head: Normocephalic and atraumatic.     Mouth/Throat:     Pharynx: No oropharyngeal exudate.  Eyes:     Pupils: Pupils are equal, round, and reactive to light.  Neck:     Thyroid: No thyromegaly.     Vascular: No JVD.     Trachea: No tracheal deviation.  Cardiovascular:     Rate and Rhythm: Normal rate and regular rhythm.     Heart sounds: Normal heart sounds. No murmur heard.   No friction rub. No gallop.  Pulmonary:     Effort: Pulmonary effort is normal. No respiratory distress.     Breath sounds: No wheezing or rales.  Chest:     Chest wall: No tenderness.  Abdominal:     General: Bowel sounds  are normal.     Palpations: Abdomen is soft.  Musculoskeletal:        General: Normal range of motion.     Cervical back: Normal range of motion and neck supple.  Lymphadenopathy:     Cervical: No cervical adenopathy.  Skin:    General: Skin is warm and dry.  Neurological:     Mental Status: He is alert and oriented to person, place, and time.     Cranial Nerves: No cranial nerve deficit.  Psychiatric:        Behavior: Behavior normal.  Thought Content: Thought content normal.        Judgment: Judgment normal.       Assessment/Plan: 1. Type 2 diabetes mellitus with hyperglycemia, without long-term current use of insulin (HCC) - POCT HgB A1C is 6.8 which is increased from 6.3 last visit. Will resend rybelsus to start daily and he will work on diet and exercise as his goal is to get under control without long term medications. - Semaglutide (RYBELSUS) 3 MG TABS; Take 3 mg by mouth daily.  Dispense: 30 tablet; Refill: 2  2. Essential hypertension Stable, continue current medications  3. Alcohol abuse Improving, again discussed dangers of high alcohol use and binge drinking, he has worked to cut back a lot and will continue to do so   General Counseling: Evan Sullivan verbalizes understanding of the findings of todays visit and agrees with plan of treatment. I have discussed any further diagnostic evaluation that may be needed or ordered today. We also reviewed his medications today. he has been encouraged to call the office with any questions or concerns that should arise related to todays visit.    Orders Placed This Encounter  Procedures   POCT HgB A1C    Meds ordered this encounter  Medications   Semaglutide (RYBELSUS) 3 MG TABS    Sig: Take 3 mg by mouth daily.    Dispense:  30 tablet    Refill:  2    PA approved valid 02/27/20 to 02/25/21    This patient was seen by Drema Dallas, PA-C in collaboration with Dr. Clayborn Bigness as a part of collaborative care  agreement.   Total time spent:30 Minutes Time spent includes review of chart, medications, test results, and follow up plan with the patient.      Dr Lavera Guise Internal medicine

## 2020-12-17 ENCOUNTER — Other Ambulatory Visit: Payer: Self-pay | Admitting: Physician Assistant

## 2021-01-11 ENCOUNTER — Other Ambulatory Visit: Payer: Self-pay | Admitting: Internal Medicine

## 2021-01-11 DIAGNOSIS — I10 Essential (primary) hypertension: Secondary | ICD-10-CM

## 2021-01-28 ENCOUNTER — Encounter: Payer: BC Managed Care – PPO | Admitting: Nurse Practitioner

## 2021-01-30 ENCOUNTER — Encounter: Payer: BC Managed Care – PPO | Admitting: Physician Assistant

## 2021-02-08 ENCOUNTER — Other Ambulatory Visit: Payer: Self-pay | Admitting: Physician Assistant

## 2021-02-11 ENCOUNTER — Other Ambulatory Visit: Payer: Self-pay | Admitting: Physician Assistant

## 2021-02-11 DIAGNOSIS — E781 Pure hyperglyceridemia: Secondary | ICD-10-CM

## 2021-02-16 ENCOUNTER — Telehealth: Payer: Self-pay

## 2021-02-16 NOTE — Telephone Encounter (Signed)
PA sent for RYBELSUS 3 mg tablets 02/16/21 @ 10:10 pm

## 2021-03-11 ENCOUNTER — Other Ambulatory Visit: Payer: Self-pay | Admitting: Internal Medicine

## 2021-03-11 DIAGNOSIS — I10 Essential (primary) hypertension: Secondary | ICD-10-CM

## 2021-03-20 ENCOUNTER — Telehealth: Payer: Self-pay

## 2021-03-20 NOTE — Telephone Encounter (Signed)
Left vm to confirm 03/24/21 appointment-Toni

## 2021-03-24 ENCOUNTER — Encounter: Payer: Self-pay | Admitting: Physician Assistant

## 2021-03-24 ENCOUNTER — Telehealth: Payer: Self-pay

## 2021-03-24 ENCOUNTER — Encounter (INDEPENDENT_AMBULATORY_CARE_PROVIDER_SITE_OTHER): Payer: Self-pay

## 2021-03-24 ENCOUNTER — Ambulatory Visit (INDEPENDENT_AMBULATORY_CARE_PROVIDER_SITE_OTHER): Payer: BC Managed Care – PPO | Admitting: Physician Assistant

## 2021-03-24 ENCOUNTER — Other Ambulatory Visit: Payer: Self-pay

## 2021-03-24 DIAGNOSIS — R300 Dysuria: Secondary | ICD-10-CM | POA: Diagnosis not present

## 2021-03-24 DIAGNOSIS — E119 Type 2 diabetes mellitus without complications: Secondary | ICD-10-CM

## 2021-03-24 DIAGNOSIS — F1010 Alcohol abuse, uncomplicated: Secondary | ICD-10-CM

## 2021-03-24 DIAGNOSIS — E1165 Type 2 diabetes mellitus with hyperglycemia: Secondary | ICD-10-CM | POA: Diagnosis not present

## 2021-03-24 DIAGNOSIS — I10 Essential (primary) hypertension: Secondary | ICD-10-CM

## 2021-03-24 DIAGNOSIS — M25562 Pain in left knee: Secondary | ICD-10-CM

## 2021-03-24 DIAGNOSIS — Z0001 Encounter for general adult medical examination with abnormal findings: Secondary | ICD-10-CM

## 2021-03-24 DIAGNOSIS — Z0100 Encounter for examination of eyes and vision without abnormal findings: Secondary | ICD-10-CM | POA: Diagnosis not present

## 2021-03-24 LAB — POCT GLYCOSYLATED HEMOGLOBIN (HGB A1C): Hemoglobin A1C: 6.5 % — AB (ref 4.0–5.6)

## 2021-03-24 NOTE — Telephone Encounter (Signed)
Awaiting 03/24/21 office notes for ophthalmology referral-Toni

## 2021-03-24 NOTE — Progress Notes (Signed)
[facility], [place] [address]  Internal MEDICINE  Office Visit Note  Patient Name: Evan Sullivan  [id number]  [id number]  Date of Service: 03/30/2021  Chief Complaint  Patient presents with   Annual Exam   Hypertension   Gastroesophageal Reflux   Medication Problem    Hasn't been able to receive Rybelsus due to insurance   Diabetes Mellitus     HPI Pt is here for routine health maintenance examination -Not been able to get rybelsus and is working on diet and exercise again -cutting back on sodas, occasional urge for a sweet but in moderation -Does not check sugars at home -BP at home 138/88 -No gout flares recently -Cut back on alcohol more, limits use to weekends F-Sun, maybe 3 beers per day, continuing to work on scaling down -Due for Colonoscopy next year -Foot exam: long discolored toe nails, full sensation -labs already done -Due for eye exam   Current Medication: Outpatient Encounter Medications as of 03/24/2021  Medication Sig   amLODipine (NORVASC) 10 MG tablet TAKE 1 TABLET BY MOUTH EVERY DAY   atenolol (TENORMIN) 100 MG tablet TAKE 1 TABLET BY MOUTH EVERY DAY   colchicine 0.6 MG tablet Take 1 capsule by mouth daily as needed (gout pain)   fenofibrate (TRICOR) 48 MG tablet TAKE 1 TABLET BY MOUTH EVERY DAY   hydrochlorothiazide (HYDRODIURIL) 25 MG tablet TAKE 1 TABLET BY MOUTH EVERY DAY   hydrocortisone cream 1 % Apply 1 application topically daily as needed (rash).   ibuprofen (ADVIL) 800 MG tablet TAKE 1 TABLET BY MOUTH IN THE MORNING AND AT BEDTIME AS NEEDED   pantoprazole (PROTONIX) 40 MG tablet Take 1 tablet (40 mg total) by mouth daily as needed (acid reflux).   [DISCONTINUED] Semaglutide (RYBELSUS) 3 MG TABS Take 3 mg by mouth daily.   No facility-administered encounter medications on file as of 03/24/2021.    Surgical History: Past Surgical History:  Procedure Laterality Date   COLONOSCOPY     COLONOSCOPY WITH  PROPOFOL N/A 11/30/2019   Procedure: COLONOSCOPY WITH PROPOFOL;  Surgeon: Lin Landsman, MD;  Location: [facility];  Service: Endoscopy;  Laterality: N/A;  priority 4   KNEE ARTHROSCOPY, MEDIAL PATELLO FEMORAL LIGAMENT REPAIR Bilateral 2004,2000   POLYPECTOMY  11/30/2019   Procedure: POLYPECTOMY;  Surgeon: Lin Landsman, MD;  Location: [facility];  Service: Endoscopy;;   TOTAL HIP ARTHROPLASTY Right 08/02/2017   Procedure: TOTAL HIP ARTHROPLASTY;  Surgeon: Dereck Leep, MD;  Location: [facility];  Service: Orthopedics;  Laterality: Right;    Medical History: Past Medical History:  Diagnosis Date   Arthritis    Avascular necrosis of femoral head, right ([place]) 2019   Diabetes mellitus without complication (HCC)    GERD (gastroesophageal reflux disease)    Gout    Hypertension    Peptic ulcer disease     Family History: Family History  Problem Relation Age of Onset   Hypertension Mother    Hypertension Father    Colon cancer Father    Diabetes Father       Review of Systems  Constitutional:  Negative for chills, fatigue and unexpected weight change.  HENT:  Negative for congestion, postnasal drip, rhinorrhea, sneezing and sore throat.   Eyes:  Negative for redness.  Respiratory:  Negative for cough, chest tightness and shortness of breath.   Cardiovascular:  Negative for chest pain and palpitations.  Gastrointestinal:  Negative for abdominal pain, constipation, diarrhea,  nausea and vomiting.  Genitourinary:  Negative for dysuria and frequency.  Musculoskeletal:  Negative for arthralgias, back pain, joint swelling and neck pain.  Skin:  Negative for rash.  Neurological: Negative.  Negative for tremors and numbness.  Hematological:  Negative for adenopathy. Does not bruise/bleed easily.  Psychiatric/Behavioral:  Negative for behavioral problems (Depression), sleep disturbance and suicidal ideas. The patient is not nervous/anxious.     Vital  Signs: BP (!) 142/84 Comment: 148/97   Pulse 99    Temp 98.4 F (36.9 C)    Resp 16    Ht 6\' 1"  (1.854 m)    Wt 225 lb 9.6 oz (102.3 kg)    SpO2 98%    BMI 29.76 kg/m    Physical Exam Vitals and nursing note reviewed.  Constitutional:      General: He is not in acute distress.    Appearance: He is well-developed. He is not diaphoretic.  HENT:     Head: Normocephalic and atraumatic.     Mouth/Throat:     Pharynx: No oropharyngeal exudate.  Eyes:     Pupils: Pupils are equal, round, and reactive to light.  Neck:     Thyroid: No thyromegaly.     Vascular: No JVD.     Trachea: No tracheal deviation.  Cardiovascular:     Rate and Rhythm: Normal rate and regular rhythm.     Heart sounds: Normal heart sounds. No murmur heard.   No friction rub. No gallop.  Pulmonary:     Effort: Pulmonary effort is normal. No respiratory distress.     Breath sounds: No wheezing or rales.  Chest:     Chest wall: No tenderness.  Abdominal:     General: Bowel sounds are normal.     Palpations: Abdomen is soft.  Musculoskeletal:        General: Normal range of motion.     Cervical back: Normal range of motion and neck supple.  Feet:     Right foot:     Protective Sensation: 2 sites tested.  2 sites sensed.     Skin integrity: Dry skin present.     Toenail Condition: Right toenails are abnormally thick and long.     Left foot:     Protective Sensation: 2 sites tested.  2 sites sensed.     Skin integrity: Dry skin present.     Toenail Condition: Left toenails are abnormally thick and long.  Lymphadenopathy:     Cervical: No cervical adenopathy.  Skin:    General: Skin is warm and dry.  Neurological:     Mental Status: He is alert and oriented to person, place, and time.     Cranial Nerves: No cranial nerve deficit.  Psychiatric:        Behavior: Behavior normal.        Thought Content: Thought content normal.        Judgment: Judgment normal.     LABS: Recent Results (from the past 2160  hour(s))  UA/M w/rflx Culture, Routine     Status: Abnormal   Collection Time: 03/24/21  2:54 PM   Specimen: Urine   Urine  Result Value Ref Range   Specific Gravity, UA 1.016 1.005 - 1.030   pH, UA 6.5 5.0 - 7.5   Color, UA Yellow Yellow   Appearance Ur Clear Clear   Leukocytes,UA Negative Negative   Protein,UA 2+ (A) Negative/Trace   Glucose, UA Negative Negative   Ketones, UA Negative Negative  RBC, UA Negative Negative   Bilirubin, UA Negative Negative   Urobilinogen, Ur 0.2 0.2 - 1.0 mg/dL   Nitrite, UA Negative Negative   Microscopic Examination See below:     Comment: Microscopic was indicated and was performed.   Urinalysis Reflex Comment     Comment: This specimen will not reflex to a Urine Culture.  Microalbumin, urine     Status: None   Collection Time: 03/24/21  2:54 PM  Result Value Ref Range   Microalbumin, Urine 393.3 Not Estab. ug/mL  Microscopic Examination     Status: None   Collection Time: 03/24/21  2:54 PM   Urine  Result Value Ref Range   WBC, UA 0-5 0 - 5 /hpf   RBC None seen 0 - 2 /hpf   Epithelial Cells (non renal) None seen 0 - 10 /hpf   Casts None seen None seen /lpf   Bacteria, UA None seen None seen/Few  POCT HgB A1C     Status: Abnormal   Collection Time: 03/24/21  2:57 PM  Result Value Ref Range   Hemoglobin A1C 6.5 (A) 4.0 - 5.6 %   HbA1c POC (<> result, manual entry)     HbA1c, POC (prediabetic range)     HbA1c, POC (controlled diabetic range)          Assessment/Plan: 1. Encounter for general adult medical examination with abnormal findings CPE reviewed, routine labs updated previously, due for colonoscopy next year, will need updated eye exam  2. Type 2 diabetes mellitus with hyperglycemia, without long-term current use of insulin (HCC) - POCT HgB A1C is 6.5 which is improved from 6.8 at last check despite not being able to start on Rybelsus.  We will therefore continue to control with diet and exercise.  If rising next visit  may need to find alternative medication to try - Microalbumin, urine  3. Essential hypertension Mildly elevated in office however has been stable at home, continue current medications and monitoring  4. Alcohol abuse Working to decrease alcohol consumption and is limiting use to weekends currently.  Discussed continuing to cut back on weekends as well  5. Diabetic eye exam [facility]) Will refer for eye exam - Ambulatory referral to Ophthalmology  6. Dysuria - UA/M w/rflx Culture, Routine   General Counseling: Marcelis verbalizes understanding of the findings of todays visit and agrees with plan of treatment. I have discussed any further diagnostic evaluation that may be needed or ordered today. We also reviewed his medications today. he has been encouraged to call the office with any questions or concerns that should arise related to todays visit.    Counseling:    Orders Placed This Encounter  Procedures   Microscopic Examination   UA/M w/rflx Culture, Routine   Microalbumin, urine   Ambulatory referral to Ophthalmology   POCT HgB A1C    No orders of the defined types were placed in this encounter.   This patient was seen by Drema Dallas, PA-C in collaboration with Dr. Clayborn Bigness as a part of collaborative care agreement.  Total time spent:35 Minutes  Time spent includes review of chart, medications, test results, and follow up plan with the patient.     Lavera Guise, MD  Internal Medicine

## 2021-03-25 LAB — MICROSCOPIC EXAMINATION
Bacteria, UA: NONE SEEN
Casts: NONE SEEN /lpf
Epithelial Cells (non renal): NONE SEEN /hpf (ref 0–10)
RBC, Urine: NONE SEEN /hpf (ref 0–2)

## 2021-03-25 LAB — MICROALBUMIN, URINE: Microalbumin, Urine: 393.3 ug/mL

## 2021-03-25 LAB — UA/M W/RFLX CULTURE, ROUTINE
Bilirubin, UA: NEGATIVE
Glucose, UA: NEGATIVE
Ketones, UA: NEGATIVE
Leukocytes,UA: NEGATIVE
Nitrite, UA: NEGATIVE
RBC, UA: NEGATIVE
Specific Gravity, UA: 1.016 (ref 1.005–1.030)
Urobilinogen, Ur: 0.2 mg/dL (ref 0.2–1.0)
pH, UA: 6.5 (ref 5.0–7.5)

## 2021-03-31 NOTE — Telephone Encounter (Signed)
Referral sent via proficient to [facility]

## 2021-04-01 NOTE — Telephone Encounter (Signed)
Appointment> 04/03/21 @ 2:15-[facility]

## 2021-04-03 DIAGNOSIS — D1730 Benign lipomatous neoplasm of skin and subcutaneous tissue of unspecified sites: Secondary | ICD-10-CM | POA: Diagnosis not present

## 2021-04-06 ENCOUNTER — Other Ambulatory Visit: Payer: Self-pay | Admitting: Physician Assistant

## 2021-04-06 DIAGNOSIS — I10 Essential (primary) hypertension: Secondary | ICD-10-CM

## 2021-04-09 ENCOUNTER — Other Ambulatory Visit: Payer: Self-pay | Admitting: Physician Assistant

## 2021-04-09 DIAGNOSIS — I10 Essential (primary) hypertension: Secondary | ICD-10-CM

## 2021-04-11 ENCOUNTER — Other Ambulatory Visit: Payer: Self-pay

## 2021-04-11 ENCOUNTER — Encounter: Payer: Self-pay | Admitting: Physician Assistant

## 2021-04-11 ENCOUNTER — Ambulatory Visit: Payer: BC Managed Care – PPO | Admitting: Physician Assistant

## 2021-04-11 VITALS — BP 127/93 | HR 63 | Temp 98.4°F | Resp 16 | Ht 73.0 in | Wt 214.0 lb

## 2021-04-11 DIAGNOSIS — R1013 Epigastric pain: Secondary | ICD-10-CM | POA: Diagnosis not present

## 2021-04-11 DIAGNOSIS — K5900 Constipation, unspecified: Secondary | ICD-10-CM

## 2021-04-11 DIAGNOSIS — R112 Nausea with vomiting, unspecified: Secondary | ICD-10-CM

## 2021-04-11 DIAGNOSIS — R630 Anorexia: Secondary | ICD-10-CM

## 2021-04-11 DIAGNOSIS — F1010 Alcohol abuse, uncomplicated: Secondary | ICD-10-CM

## 2021-04-11 NOTE — Progress Notes (Signed)
?[facility] ?[address] ?[place], [place] [address] ? ?Internal MEDICINE  ?Office Visit Note ? ?Patient Name: Evan Sullivan ? [id number]  ?[id number] ? ?Date of Service: 04/11/2021 ? ?Chief Complaint  ?Patient presents with  ? Abdominal Pain  ?  Severe abdominal pain, hasn't eaten since Tuesday, no appetite, only apple juice, pain comes in waves - having hot flashes and is sweaty  ? ? ? ?HPI ?Pt is here for a sick visit. ?-Pain started Tuesday, much worse Wednesday at work and left early.  ?-Has reduced appetite. Has had some applesauce and jello, but otherwise no food since Tuesday. He is drinking apple juice and water some, but feels full and bloated and intake makes him nauseous and starts vomiting. Has been vomiting each day. Was able to take BP meds this morning after vomiting stopped.  ?-No fever, but feels hot and is sweating ?-Only BM was yesterday which was small and liquid mostly, darker looking, prior to that last BM on Tuesday prior to pain. ?-Had a similar episode about a year ago. Dx with acute pancreatitis. Symptoms similar to then, but also some concern for SBO though is able to pass a little gas.  ?-pain comes in waves but also some underlying pain present at all times. Pain mostly in epigastric region, no lower abdominal pain. ? ?Current Medication: ? ?Outpatient Encounter Medications as of 04/11/2021  ?Medication Sig  ? amLODipine (NORVASC) 10 MG tablet TAKE 1 TABLET BY MOUTH EVERY DAY  ? atenolol (TENORMIN) 100 MG tablet TAKE 1 TABLET BY MOUTH EVERY DAY  ? colchicine 0.6 MG tablet Take 1 capsule by mouth daily as needed (gout pain)  ? fenofibrate (TRICOR) 48 MG tablet TAKE 1 TABLET BY MOUTH EVERY DAY  ? hydrochlorothiazide (HYDRODIURIL) 25 MG tablet TAKE 1 TABLET BY MOUTH EVERY DAY  ? hydrocortisone cream 1 % Apply 1 application topically daily as needed (rash).  ? ibuprofen (ADVIL) 800 MG tablet TAKE 1 TABLET BY MOUTH IN THE MORNING AND AT BEDTIME AS NEEDED  ? pantoprazole (PROTONIX)  40 MG tablet Take 1 tablet (40 mg total) by mouth daily as needed (acid reflux).  ? ?No facility-administered encounter medications on file as of 04/11/2021.  ? ? ? ? ?Medical History: ?Past Medical History:  ?Diagnosis Date  ? Arthritis   ? Avascular necrosis of femoral head, right ([place]) 2019  ? Diabetes mellitus without complication ([place])   ? GERD (gastroesophageal reflux disease)   ? Gout   ? Hypertension   ? Peptic ulcer disease   ? ? ? ?Vital Signs: ?BP (!) 127/93   Pulse 63   Temp 98.4 ?F (36.9 ?C)   Resp 16   Ht '6\' 1"'$  (1.854 m)   Wt 214 lb (97.1 kg)   SpO2 99%   BMI 28.23 kg/m?  ? ? ?Review of Systems  ?Constitutional:  Positive for activity change, appetite change, diaphoresis and fatigue. Negative for fever.  ?HENT:  Negative for congestion, mouth sores and postnasal drip.   ?Respiratory:  Negative for cough.   ?Cardiovascular:  Negative for chest pain.  ?Gastrointestinal:  Positive for abdominal distention, abdominal pain, constipation, nausea and vomiting.  ?Genitourinary:  Negative for flank pain.  ?Psychiatric/Behavioral: Negative.    ? ?Physical Exam ?Vitals and nursing note reviewed.  ?Constitutional:   ?   General: He is not in acute distress. ?   Appearance: He is well-developed. He is diaphoretic.  ?HENT:  ?   Head: Normocephalic and atraumatic.  ?  Mouth/Throat:  ?   Pharynx: No oropharyngeal exudate.  ?Eyes:  ?   Pupils: Pupils are equal, round, and reactive to light.  ?Neck:  ?   Thyroid: No thyromegaly.  ?   Vascular: No JVD.  ?   Trachea: No tracheal deviation.  ?Cardiovascular:  ?   Rate and Rhythm: Normal rate and regular rhythm.  ?   Heart sounds: Normal heart sounds. No murmur heard. ?  No friction rub. No gallop.  ?Pulmonary:  ?   Effort: Pulmonary effort is normal. No respiratory distress.  ?   Breath sounds: No wheezing or rales.  ?Chest:  ?   Chest wall: No tenderness.  ?Abdominal:  ?   General: Bowel sounds are normal. There is distension.  ?   Palpations: Abdomen is soft.  ?    Tenderness: There is abdominal tenderness.  ?Musculoskeletal:     ?   General: Normal range of motion.  ?   Cervical back: Normal range of motion and neck supple.  ?Lymphadenopathy:  ?   Cervical: No cervical adenopathy.  ?Skin: ?   General: Skin is warm.  ?Neurological:  ?   Mental Status: He is alert and oriented to person, place, and time.  ?   Cranial Nerves: No cranial nerve deficit.  ?Psychiatric:     ?   Behavior: Behavior normal.     ?   Thought Content: Thought content normal.     ?   Judgment: Judgment normal.  ? ? ? ? ?Assessment/Plan: ?1. Epigastric pain ?Concern for acute pancreatitis vs bowel obstruction. Advised to go to ED for imaging and IV fluids given presentation and inability to eat or stay well hydrated from N/V. Does have hx of acute pancreatitis 1.5 years ago due to alcohol ? ?2. Nausea and vomiting, unspecified vomiting type ?Advised to go to ED due to concern for acute pancreatitis and need for IV fluids ? ?3. Constipation, unspecified constipation type ?Advised to go to ED due to concern for acute pancreatitis vs bowel obstruction and need for IV fluids ? ?4. Alcohol abuse ?Had been working on reducing intake but does still abuse alcohol and has hx of acute pancreatitis due to alcohol ? ?5. Lack of appetite ?Advised to go to ED due to concern for acute pancreatitis and need for IV fluids ? ? ?General Counseling: Zhaire verbalizes understanding of the findings of todays visit and agrees with plan of treatment. I have discussed any further diagnostic evaluation that may be needed or ordered today. We also reviewed his medications today. he has been encouraged to call the office with any questions or concerns that should arise related to todays visit. ? ? ? ?Counseling: ? ? ? ?No orders of the defined types were placed in this encounter. ? ? ?No orders of the defined types were placed in this encounter. ? ? ?Time spent:30 Minutes ?

## 2021-04-12 ENCOUNTER — Encounter (HOSPITAL_COMMUNITY): Payer: Self-pay

## 2021-04-12 ENCOUNTER — Emergency Department (HOSPITAL_COMMUNITY)
Admission: EM | Admit: 2021-04-12 | Discharge: 2021-04-12 | Disposition: A | Discharge status: Home / Self Care | Payer: BC Managed Care – PPO | Attending: Emergency Medicine | Admitting: Emergency Medicine

## 2021-04-12 DIAGNOSIS — Z79899 Other long term (current) drug therapy: Secondary | ICD-10-CM | POA: Diagnosis not present

## 2021-04-12 DIAGNOSIS — R3912 Poor urinary stream: Secondary | ICD-10-CM | POA: Diagnosis not present

## 2021-04-12 DIAGNOSIS — R1013 Epigastric pain: Secondary | ICD-10-CM | POA: Insufficient documentation

## 2021-04-12 DIAGNOSIS — K219 Gastro-esophageal reflux disease without esophagitis: Secondary | ICD-10-CM

## 2021-04-12 DIAGNOSIS — I10 Essential (primary) hypertension: Secondary | ICD-10-CM | POA: Insufficient documentation

## 2021-04-12 DIAGNOSIS — E119 Type 2 diabetes mellitus without complications: Secondary | ICD-10-CM | POA: Insufficient documentation

## 2021-04-12 DIAGNOSIS — R112 Nausea with vomiting, unspecified: Secondary | ICD-10-CM | POA: Insufficient documentation

## 2021-04-12 DIAGNOSIS — R197 Diarrhea, unspecified: Secondary | ICD-10-CM | POA: Insufficient documentation

## 2021-04-12 LAB — CBC WITH DIFFERENTIAL/PLATELET
Abs Immature Granulocytes: 0.04 10*3/uL (ref 0.00–0.07)
Basophils Absolute: 0 10*3/uL (ref 0.0–0.1)
Basophils Relative: 0 %
Eosinophils Absolute: 0 10*3/uL (ref 0.0–0.5)
Eosinophils Relative: 0 %
HCT: 36.1 % — ABNORMAL LOW (ref 39.0–52.0)
Hemoglobin: 12.6 g/dL — ABNORMAL LOW (ref 13.0–17.0)
Immature Granulocytes: 0 %
Lymphocytes Relative: 26 %
Lymphs Abs: 2.3 10*3/uL (ref 0.7–4.0)
MCH: 32.3 pg (ref 26.0–34.0)
MCHC: 34.9 g/dL (ref 30.0–36.0)
MCV: 92.6 fL (ref 80.0–100.0)
Monocytes Absolute: 0.4 10*3/uL (ref 0.1–1.0)
Monocytes Relative: 5 %
Neutro Abs: 6.2 10*3/uL (ref 1.7–7.7)
Neutrophils Relative %: 69 %
Platelets: 329 10*3/uL (ref 150–400)
RBC: 3.9 MIL/uL — ABNORMAL LOW (ref 4.22–5.81)
RDW: 11.8 % (ref 11.5–15.5)
WBC: 9 10*3/uL (ref 4.0–10.5)
nRBC: 0 % (ref 0.0–0.2)

## 2021-04-12 LAB — COMPREHENSIVE METABOLIC PANEL
ALT: 16 U/L (ref 0–44)
AST: 15 U/L (ref 15–41)
Albumin: 3.9 g/dL (ref 3.5–5.0)
Alkaline Phosphatase: 34 U/L — ABNORMAL LOW (ref 38–126)
Anion gap: 10 (ref 5–15)
BUN: 44 mg/dL — ABNORMAL HIGH (ref 6–20)
CO2: 29 mmol/L (ref 22–32)
Calcium: 9.3 mg/dL (ref 8.9–10.3)
Chloride: 94 mmol/L — ABNORMAL LOW (ref 98–111)
Creatinine, Ser: 1.11 mg/dL (ref 0.61–1.24)
GFR, Estimated: >60 mL/min (ref >60)
Glucose, Bld: 140 mg/dL — ABNORMAL HIGH (ref 70–99)
Potassium: 3.5 mmol/L (ref 3.5–5.1)
Sodium: 133 mmol/L — ABNORMAL LOW (ref 135–145)
Total Bilirubin: 0.3 mg/dL (ref 0.3–1.2)
Total Protein: 7.2 g/dL (ref 6.5–8.1)

## 2021-04-12 LAB — LIPASE, BLOOD: Lipase: 42 U/L (ref 11–51)

## 2021-04-12 MED ORDER — KETOROLAC TROMETHAMINE 30 MG/ML IJ SOLN
30.0000 mg | Freq: Once | INTRAMUSCULAR | Status: AC
  Administered 2021-04-12: 30 mg via INTRAVENOUS
  Filled 2021-04-12: qty 1

## 2021-04-12 MED ORDER — PANTOPRAZOLE SODIUM 40 MG PO TBEC: 40.0000 mg | DELAYED_RELEASE_TABLET | Freq: Every day | ORAL | 5 refills | Status: DC | PRN

## 2021-04-12 MED ORDER — ALUM & MAG HYDROXIDE-SIMETH 200-200-20 MG/5ML PO SUSP
30.0000 mL | Freq: Once | ORAL | Status: AC
  Administered 2021-04-12: 30 mL via ORAL
  Filled 2021-04-12: qty 30

## 2021-04-12 MED ORDER — ONDANSETRON HCL 4 MG/2ML IJ SOLN
4.0000 mg | Freq: Once | INTRAMUSCULAR | Status: AC
  Administered 2021-04-12: 4 mg via INTRAVENOUS
  Filled 2021-04-12: qty 2

## 2021-04-12 MED ORDER — SODIUM CHLORIDE 0.9 % IV BOLUS
1000.0000 mL | Freq: Once | INTRAVENOUS | Status: AC
  Administered 2021-04-12: 1000 mL via INTRAVENOUS

## 2021-04-12 MED ORDER — ONDANSETRON HCL 4 MG PO TABS: 4.0000 mg | ORAL_TABLET | Freq: Three times a day (TID) | ORAL | 0 refills | Status: DC | PRN

## 2021-04-12 MED ORDER — LIDOCAINE VISCOUS HCL 2 % MT SOLN
15.0000 mL | Freq: Once | OROMUCOSAL | Status: AC
  Administered 2021-04-12: 15 mL via ORAL
  Filled 2021-04-12: qty 15

## 2021-04-12 NOTE — ED Triage Notes (Addendum)
Pt arrived via POV, c/o diffuse abd pain, vomititng and diarrhea since last week. Denies any nausea,or urinary issues. Was seen by PCP and told to come to ED if not better in a few days.  ?

## 2021-04-12 NOTE — ED Provider Notes (Signed)
?[facility] DEPT ?Provider Note ? ? ?CSN: [id number] ?Arrival date & time: 04/12/21  0847 ? ?  ? ?History ? ?Chief Complaint  ?Patient presents with  ? Abdominal Pain  ? ? ?Evan Sullivan is a 51 y.o. male. ? ?HPI ? ?51 year old male with past medical history of HTN, GERD, DM presents to the emergency department with concern for epigastric abdominal pain.  Patient states that this started about 4 days ago.  His appetite has declined.  He is now experiencing nonbloody vomiting and loose stools.  Denies any fever.  Believes that he had an episode of pancreatitis in the past.  States he is otherwise been compliant with his medications specifically the Protonix.  Has been unable to keep down even fluids since this morning.  Endorses decreased urine production. ? ?Home Medications ?Prior to Admission medications   ?Medication Sig Start Date End Date Taking? Authorizing Provider  ?amLODipine (NORVASC) 10 MG tablet TAKE 1 TABLET BY MOUTH EVERY DAY 04/06/21   McDonough, Si Gaul, PA-C  ?atenolol (TENORMIN) 100 MG tablet TAKE 1 TABLET BY MOUTH EVERY DAY 04/09/21   Lavera Guise, MD  ?colchicine 0.6 MG tablet Take 1 capsule by mouth daily as needed (gout pain) 06/26/20   McDonough, Si Gaul, PA-C  ?fenofibrate (TRICOR) 48 MG tablet TAKE 1 TABLET BY MOUTH EVERY DAY 02/11/21   McDonough, Si Gaul, PA-C  ?hydrochlorothiazide (HYDRODIURIL) 25 MG tablet TAKE 1 TABLET BY MOUTH EVERY DAY 04/09/21   Lavera Guise, MD  ?hydrocortisone cream 1 % Apply 1 application topically daily as needed (rash).    [provider]  ?ibuprofen (ADVIL) 800 MG tablet TAKE 1 TABLET BY MOUTH IN THE MORNING AND AT BEDTIME AS NEEDED 02/08/21   McDonough, Si Gaul, PA-C  ?pantoprazole (PROTONIX) 40 MG tablet Take 1 tablet (40 mg total) by mouth daily as needed (acid reflux). 06/20/20   McDonough, Si Gaul, PA-C  ?   ? ?Allergies    ?Patient has no known allergies.   ? ?Review of Systems   ?Review of Systems  ?Constitutional:   Positive for appetite change. Negative for fever.  ?Respiratory:  Negative for shortness of breath.   ?Cardiovascular:  Negative for chest pain.  ?Gastrointestinal:  Positive for abdominal pain, diarrhea, nausea and vomiting.  ?Skin:  Negative for rash.  ?Neurological:  Negative for headaches.  ? ?Physical Exam ?Updated Vital Signs ?BP 110/77   Pulse 61   Temp 97.8 ?F (36.6 ?C) (Oral)   Resp 17   Ht '6\' 1"'$  (1.854 m)   Wt 97.1 kg   SpO2 99%   BMI 28.23 kg/m?  ?Physical Exam ?Vitals and nursing note reviewed.  ?Constitutional:   ?   General: He is not in acute distress. ?   Appearance: Normal appearance.  ?HENT:  ?   Head: Normocephalic.  ?   Mouth/Throat:  ?   Mouth: Mucous membranes are moist.  ?Cardiovascular:  ?   Rate and Rhythm: Normal rate.  ?Pulmonary:  ?   Effort: Pulmonary effort is normal. No respiratory distress.  ?Abdominal:  ?   General: Bowel sounds are normal. There is no distension.  ?   Palpations: Abdomen is soft.  ?   Tenderness: There is abdominal tenderness in the epigastric area. There is no guarding or rebound. Negative signs include Murphy's sign.  ?Skin: ?   General: Skin is warm.  ?Neurological:  ?   Mental Status: He is alert and oriented to person, place, and  time. Mental status is at baseline.  ?Psychiatric:     ?   Mood and Affect: Mood normal.  ? ? ?ED Results / Procedures / Treatments   ?Labs ?(all labs ordered are listed, but only abnormal results are displayed) ?Labs Reviewed  ?CBC WITH DIFFERENTIAL/PLATELET - Abnormal; Notable for the following components:  ?    Result Value  ? RBC 3.90 (*)   ? Hemoglobin 12.6 (*)   ? HCT 36.1 (*)   ? All other components within normal limits  ?COMPREHENSIVE METABOLIC PANEL  ?LIPASE, BLOOD  ? ? ?EKG ?None ? ?Radiology ?No results found. ? ?Procedures ?Procedures  ? ? ?Medications Ordered in ED ?Medications  ?sodium chloride 0.9 % bolus 1,000 mL (1,000 mLs Intravenous New Bag/Given 04/12/21 1106)  ?ondansetron (ZOFRAN) injection 4 mg (4 mg  Intravenous Given 04/12/21 1106)  ?ketorolac (TORADOL) 30 MG/ML injection 30 mg (30 mg Intravenous Given 04/12/21 1107)  ? ? ?ED Course/ Medical Decision Making/ A&P ?  ?                        ?Medical Decision Making ?Amount and/or Complexity of Data Reviewed ?Labs: ordered. ? ?Risk ?OTC drugs. ?Prescription drug management. ? ? ?This patient presents to the ED for concern of nausea/vomiting/diarrhea, this involves an extensive number of treatment options, and is a complaint that carries with it a high risk of complications and morbidity.  The differential diagnosis includes gastroenteritis, GERD, abdominal infection, abdominal pathology, pancreatitis, gallbladder disease ? ? ?Additional history obtained: ?-Additional history obtained from wife at bedside ?-External records from outside source obtained and reviewed including: Chart review including previous notes, labs, imaging, consultation notes ? ? ?Lab Tests: ?-I ordered, reviewed, and interpreted labs.  The pertinent results include: Baseline anemia, mild hyponatremia, normal abdominal labs including lipase ? ? ?Medicines ordered and prescription drug management: ?-I ordered medication including GI cocktail and IV fluids/nausea medicine for symptoms ?-Reevaluation of the patient after these medicines showed that the patient resolved ?-I have reviewed the patients home medicines and have made adjustments as needed ? ? ?ED Course: ?51 year old male presents emergency department with 4 days of nausea/vomiting/diarrhea.  Had some mild epigastric pain and currently feels like indigestion going up into his chest.  No shortness of breath.  No acute abdominal pain.  No blood in the emesis or stools.  Vitals are stable on arrival, he is afebrile.  Abdomen is benign.  Blood work is baseline for the patient with no other acute findings.  After medication patient states his symptoms have completely resolved.  No indication for emergent imaging at this time.  Patient used  to take Prilosec daily for peptic ulcer disease of which she has stopped, I have refilled this prescription and encouraged his compliance. ? ? ? ?Cardiac Monitoring: ?The patient was maintained on a cardiac monitor.  I personally viewed and interpreted the cardiac monitored which showed an underlying rhythm of: Sinus rhythm ? ? ?Reevaluation: ?After the interventions noted above, I reevaluated the patient and found that they have :resolved ? ? ?Dispostion: ?Patient at this time appears safe and stable for discharge and close outpatient follow up. Discharge plan and strict return to ED precautions discussed, patient verbalizes understanding and agreement. ? ? ? ? ? ? ? ?Final Clinical Impression(s) / ED Diagnoses ?Final diagnoses:  ?None  ? ? ?Rx / DC Orders ?ED Discharge Orders   ? ? None  ? ?  ? ? ?  ?Chenelle Benning,  Alvin Critchley, DO ?04/12/21 1442 ? ?

## 2021-04-12 NOTE — Discharge Instructions (Addendum)
You have been seen and discharged from the emergency department.  Your blood work was normal.  I believe her symptoms are stemming from GERD/acid reflux.  Take medications as directed.  Stay well-hydrated.  Follow-up with your primary provider for further evaluation and further care. Take home medications as prescribed. If you have any worsening symptoms or further concerns for your health please return to an emergency department for further evaluation. ?

## 2021-04-14 ENCOUNTER — Telehealth: Payer: Self-pay

## 2021-04-14 NOTE — Telephone Encounter (Signed)
Called patient to schedule ED follow up. He stated he is fine now. No need for visit-[facility]oni ?

## 2021-05-13 ENCOUNTER — Inpatient Hospital Stay (HOSPITAL_COMMUNITY)
Admission: EM | Admit: 2021-05-13 | Discharge: 2021-05-16 | DRG: 439 | Disposition: A | Discharge status: Home / Self Care | Payer: BC Managed Care – PPO | Attending: Family Medicine | Admitting: Family Medicine

## 2021-05-13 ENCOUNTER — Emergency Department (HOSPITAL_COMMUNITY): Payer: BC Managed Care – PPO

## 2021-05-13 ENCOUNTER — Encounter (HOSPITAL_COMMUNITY): Payer: Self-pay | Admitting: Emergency Medicine

## 2021-05-13 ENCOUNTER — Other Ambulatory Visit: Payer: Self-pay

## 2021-05-13 DIAGNOSIS — K859 Acute pancreatitis without necrosis or infection, unspecified: Secondary | ICD-10-CM | POA: Diagnosis present

## 2021-05-13 DIAGNOSIS — K861 Other chronic pancreatitis: Secondary | ICD-10-CM | POA: Diagnosis present

## 2021-05-13 DIAGNOSIS — Z96641 Presence of right artificial hip joint: Secondary | ICD-10-CM | POA: Diagnosis present

## 2021-05-13 DIAGNOSIS — I10 Essential (primary) hypertension: Secondary | ICD-10-CM | POA: Diagnosis not present

## 2021-05-13 DIAGNOSIS — D509 Iron deficiency anemia, unspecified: Secondary | ICD-10-CM | POA: Diagnosis not present

## 2021-05-13 DIAGNOSIS — K852 Alcohol induced acute pancreatitis without necrosis or infection: Principal | ICD-10-CM | POA: Diagnosis present

## 2021-05-13 DIAGNOSIS — M109 Gout, unspecified: Secondary | ICD-10-CM | POA: Diagnosis not present

## 2021-05-13 DIAGNOSIS — I708 Atherosclerosis of other arteries: Secondary | ICD-10-CM | POA: Diagnosis present

## 2021-05-13 DIAGNOSIS — Z8739 Personal history of other diseases of the musculoskeletal system and connective tissue: Secondary | ICD-10-CM

## 2021-05-13 DIAGNOSIS — Z79899 Other long term (current) drug therapy: Secondary | ICD-10-CM

## 2021-05-13 DIAGNOSIS — K219 Gastro-esophageal reflux disease without esophagitis: Secondary | ICD-10-CM | POA: Diagnosis not present

## 2021-05-13 DIAGNOSIS — E785 Hyperlipidemia, unspecified: Secondary | ICD-10-CM | POA: Diagnosis not present

## 2021-05-13 DIAGNOSIS — E1169 Type 2 diabetes mellitus with other specified complication: Secondary | ICD-10-CM | POA: Insufficient documentation

## 2021-05-13 DIAGNOSIS — F1210 Cannabis abuse, uncomplicated: Secondary | ICD-10-CM | POA: Diagnosis not present

## 2021-05-13 DIAGNOSIS — R1013 Epigastric pain: Principal | ICD-10-CM

## 2021-05-13 DIAGNOSIS — F1010 Alcohol abuse, uncomplicated: Secondary | ICD-10-CM | POA: Diagnosis not present

## 2021-05-13 DIAGNOSIS — R109 Unspecified abdominal pain: Secondary | ICD-10-CM | POA: Diagnosis not present

## 2021-05-13 DIAGNOSIS — E1165 Type 2 diabetes mellitus with hyperglycemia: Secondary | ICD-10-CM | POA: Diagnosis not present

## 2021-05-13 DIAGNOSIS — R739 Hyperglycemia, unspecified: Secondary | ICD-10-CM

## 2021-05-13 DIAGNOSIS — E876 Hypokalemia: Secondary | ICD-10-CM

## 2021-05-13 DIAGNOSIS — Z833 Family history of diabetes mellitus: Secondary | ICD-10-CM | POA: Diagnosis not present

## 2021-05-13 DIAGNOSIS — Z8249 Family history of ischemic heart disease and other diseases of the circulatory system: Secondary | ICD-10-CM

## 2021-05-13 DIAGNOSIS — Z8711 Personal history of peptic ulcer disease: Secondary | ICD-10-CM | POA: Diagnosis not present

## 2021-05-13 DIAGNOSIS — I774 Celiac artery compression syndrome: Secondary | ICD-10-CM | POA: Diagnosis present

## 2021-05-13 DIAGNOSIS — Z800 Family history of malignant neoplasm of digestive organs: Secondary | ICD-10-CM | POA: Diagnosis not present

## 2021-05-13 DIAGNOSIS — D649 Anemia, unspecified: Secondary | ICD-10-CM | POA: Diagnosis not present

## 2021-05-13 DIAGNOSIS — R1012 Left upper quadrant pain: Secondary | ICD-10-CM | POA: Diagnosis not present

## 2021-05-13 LAB — CBC WITH DIFFERENTIAL/PLATELET
Abs Immature Granulocytes: 0.03 10*3/uL (ref 0.00–0.07)
Basophils Absolute: 0 10*3/uL (ref 0.0–0.1)
Basophils Relative: 0 %
Eosinophils Absolute: 0 10*3/uL (ref 0.0–0.5)
Eosinophils Relative: 0 %
HCT: 36.5 % — ABNORMAL LOW (ref 39.0–52.0)
Hemoglobin: 12.1 g/dL — ABNORMAL LOW (ref 13.0–17.0)
Immature Granulocytes: 0 %
Lymphocytes Relative: 8 %
Lymphs Abs: 0.7 10*3/uL (ref 0.7–4.0)
MCH: 28.8 pg (ref 26.0–34.0)
MCHC: 33.2 g/dL (ref 30.0–36.0)
MCV: 86.9 fL (ref 80.0–100.0)
Monocytes Absolute: 0.5 10*3/uL (ref 0.1–1.0)
Monocytes Relative: 6 %
Neutro Abs: 7.1 10*3/uL (ref 1.7–7.7)
Neutrophils Relative %: 86 %
Platelets: 424 10*3/uL — ABNORMAL HIGH (ref 150–400)
RBC: 4.2 MIL/uL — ABNORMAL LOW (ref 4.22–5.81)
RDW: 13 % (ref 11.5–15.5)
WBC: 8.4 10*3/uL (ref 4.0–10.5)
nRBC: 0 % (ref 0.0–0.2)

## 2021-05-13 LAB — COMPREHENSIVE METABOLIC PANEL
ALT: 19 U/L (ref 0–44)
AST: 21 U/L (ref 15–41)
Albumin: 4.7 g/dL (ref 3.5–5.0)
Alkaline Phosphatase: 49 U/L (ref 38–126)
Anion gap: 11 (ref 5–15)
BUN: 14 mg/dL (ref 6–20)
CO2: 22 mmol/L (ref 22–32)
Calcium: 9.6 mg/dL (ref 8.9–10.3)
Chloride: 101 mmol/L (ref 98–111)
Creatinine, Ser: 0.94 mg/dL (ref 0.61–1.24)
GFR, Estimated: >60 mL/min (ref >60)
Glucose, Bld: 154 mg/dL — ABNORMAL HIGH (ref 70–99)
Potassium: 3.4 mmol/L — ABNORMAL LOW (ref 3.5–5.1)
Sodium: 134 mmol/L — ABNORMAL LOW (ref 135–145)
Total Bilirubin: 0.5 mg/dL (ref 0.3–1.2)
Total Protein: 8.3 g/dL — ABNORMAL HIGH (ref 6.5–8.1)

## 2021-05-13 LAB — LIPASE, BLOOD: Lipase: 1017 U/L — ABNORMAL HIGH (ref 11–51)

## 2021-05-13 LAB — LACTIC ACID, PLASMA
Lactic Acid, Venous: 1.6 mmol/L (ref 0.5–1.9)
Lactic Acid, Venous: 0.9 mmol/L (ref 0.5–1.9)

## 2021-05-13 MED ORDER — THIAMINE HCL 100 MG/ML IJ SOLN: 100.0000 mg | Freq: Every day | INTRAMUSCULAR | Status: DC

## 2021-05-13 MED ORDER — ADULT MULTIVITAMIN W/MINERALS CH
1.0000 | ORAL_TABLET | Freq: Every day | ORAL | Status: DC
  Administered 2021-05-14 – 2021-05-16 (×3): 1 via ORAL
  Filled 2021-05-13 (×3): qty 1

## 2021-05-13 MED ORDER — SODIUM CHLORIDE 0.9 % IV BOLUS
1000.0000 mL | Freq: Once | INTRAVENOUS | Status: AC
  Administered 2021-05-13: 1000 mL via INTRAVENOUS

## 2021-05-13 MED ORDER — FOLIC ACID 1 MG PO TABS
1.0000 mg | ORAL_TABLET | Freq: Every day | ORAL | Status: DC
  Administered 2021-05-14 – 2021-05-16 (×3): 1 mg via ORAL
  Filled 2021-05-13 (×3): qty 1

## 2021-05-13 MED ORDER — AMLODIPINE BESYLATE 10 MG PO TABS
10.0000 mg | ORAL_TABLET | Freq: Every day | ORAL | Status: DC
  Administered 2021-05-14 – 2021-05-16 (×3): 10 mg via ORAL
  Filled 2021-05-13 (×3): qty 1

## 2021-05-13 MED ORDER — IOHEXOL 350 MG/ML SOLN
100.0000 mL | Freq: Once | INTRAVENOUS | Status: AC | PRN
  Administered 2021-05-13: 100 mL via INTRAVENOUS

## 2021-05-13 MED ORDER — PROCHLORPERAZINE EDISYLATE 10 MG/2ML IJ SOLN: 10.0000 mg | Freq: Four times a day (QID) | INTRAMUSCULAR | Status: DC | PRN

## 2021-05-13 MED ORDER — SODIUM CHLORIDE (PF) 0.9 % IJ SOLN
INTRAMUSCULAR | Status: AC
  Filled 2021-05-13: qty 50

## 2021-05-13 MED ORDER — THIAMINE HCL 100 MG PO TABS
100.0000 mg | ORAL_TABLET | Freq: Every day | ORAL | Status: DC
  Administered 2021-05-14 – 2021-05-16 (×3): 100 mg via ORAL
  Filled 2021-05-13 (×3): qty 1

## 2021-05-13 MED ORDER — SODIUM CHLORIDE 0.9 % IV SOLN: INTRAVENOUS | Status: AC

## 2021-05-13 MED ORDER — PANTOPRAZOLE SODIUM 40 MG IV SOLR
40.0000 mg | INTRAVENOUS | Status: DC
  Administered 2021-05-13 – 2021-05-15 (×3): 40 mg via INTRAVENOUS
  Filled 2021-05-13 (×3): qty 10

## 2021-05-13 MED ORDER — LORAZEPAM 2 MG/ML IJ SOLN: 1.0000 mg | INTRAMUSCULAR | Status: DC | PRN

## 2021-05-13 MED ORDER — POTASSIUM CHLORIDE CRYS ER 20 MEQ PO TBCR
40.0000 meq | EXTENDED_RELEASE_TABLET | Freq: Every day | ORAL | Status: DC
Start: 2021-05-14 — End: 2021-05-16
  Administered 2021-05-14 – 2021-05-16 (×3): 40 meq via ORAL
  Filled 2021-05-13 (×3): qty 2

## 2021-05-13 MED ORDER — ONDANSETRON HCL 4 MG/2ML IJ SOLN
4.0000 mg | Freq: Once | INTRAMUSCULAR | Status: AC
  Administered 2021-05-13: 4 mg via INTRAVENOUS
  Filled 2021-05-13: qty 2

## 2021-05-13 MED ORDER — ENOXAPARIN SODIUM 40 MG/0.4ML IJ SOSY
40.0000 mg | PREFILLED_SYRINGE | Freq: Every day | INTRAMUSCULAR | Status: DC
  Administered 2021-05-13 – 2021-05-15 (×3): 40 mg via SUBCUTANEOUS
  Filled 2021-05-13 (×3): qty 0.4

## 2021-05-13 MED ORDER — ATENOLOL 25 MG PO TABS
100.0000 mg | ORAL_TABLET | Freq: Every day | ORAL | Status: DC
  Administered 2021-05-14 – 2021-05-16 (×3): 100 mg via ORAL
  Filled 2021-05-13 (×3): qty 4

## 2021-05-13 MED ORDER — FENOFIBRATE 54 MG PO TABS
54.0000 mg | ORAL_TABLET | Freq: Every day | ORAL | Status: DC
Start: 2021-05-14 — End: 2021-05-16
  Administered 2021-05-14 – 2021-05-16 (×3): 54 mg via ORAL
  Filled 2021-05-13 (×3): qty 1

## 2021-05-13 MED ORDER — MORPHINE SULFATE (PF) 4 MG/ML IV SOLN
4.0000 mg | INTRAVENOUS | Status: DC | PRN
  Administered 2021-05-13 – 2021-05-15 (×8): 4 mg via INTRAVENOUS
  Filled 2021-05-13 (×8): qty 1

## 2021-05-13 MED ORDER — HYDROCHLOROTHIAZIDE 25 MG PO TABS: 25.0000 mg | ORAL_TABLET | Freq: Every day | ORAL | Status: DC

## 2021-05-13 MED ORDER — LORAZEPAM 1 MG PO TABS: 1.0000 mg | ORAL_TABLET | ORAL | Status: DC | PRN

## 2021-05-13 MED ORDER — MORPHINE SULFATE (PF) 4 MG/ML IV SOLN
4.0000 mg | Freq: Once | INTRAVENOUS | Status: AC
  Administered 2021-05-13: 4 mg via INTRAVENOUS
  Filled 2021-05-13: qty 1

## 2021-05-13 MED ORDER — HYDRALAZINE HCL 20 MG/ML IJ SOLN: 10.0000 mg | Freq: Three times a day (TID) | INTRAMUSCULAR | Status: DC | PRN

## 2021-05-13 NOTE — H&P (Signed)
?History and Physical  ? ? ?Patient: Evan Sullivan [id number] DOB: 10-07-1970 ?DOA: 05/13/2021 ?DOS: the patient was seen and examined on 05/13/2021 ?PCP: Mylinda Latina, PA-C  ?Patient coming from: Home ? ?Chief Complaint:  ?Chief Complaint  ?Patient presents with  ? Abdominal Pain  ? ?HPI: Evan Sullivan is a 51 y.o. male with medical history significant of HTN, HLD, gout, HTN. Presenting with stomach pain. He first noticed his symptoms 3 weeks ago. He had episodic epigastric and LUQ abdominal pain. It was crampy. It would initially happen after eating. He tried protonix and ibuprofen after a visit to the ED. However, these meds didn't help. His pain has now progressed to being constant over the last 24 hours. He had some N/V this morning. When his symptoms didn't improve, he decided to come to the ED for help. He denies any other aggravating or alleviating factors.   ? ?Review of Systems: As mentioned in the history of present illness. All other systems reviewed and are negative. ?Past Medical History:  ?Diagnosis Date  ? Arthritis   ? Avascular necrosis of femoral head, right ([place]) 2019  ? Diabetes mellitus without complication ([place])   ? GERD (gastroesophageal reflux disease)   ? Gout   ? Hypertension   ? Peptic ulcer disease   ? ?Past Surgical History:  ?Procedure Laterality Date  ? COLONOSCOPY    ? COLONOSCOPY WITH PROPOFOL N/A 11/30/2019  ? Procedure: COLONOSCOPY WITH PROPOFOL;  Surgeon: Lin Landsman, MD;  Location: [facility];  Service: Endoscopy;  Laterality: N/A;  priority 4  ? KNEE ARTHROSCOPY, MEDIAL PATELLO FEMORAL LIGAMENT REPAIR Bilateral 2004,2000  ? POLYPECTOMY  11/30/2019  ? Procedure: POLYPECTOMY;  Surgeon: Lin Landsman, MD;  Location: [facility];  Service: Endoscopy;;  ? TOTAL HIP ARTHROPLASTY Right 08/02/2017  ? Procedure: TOTAL HIP ARTHROPLASTY;  Surgeon: Dereck Leep, MD;  Location: [facility];  Service: Orthopedics;  Laterality: Right;  ? ?Social History:   reports that he has never smoked. He has never used smokeless tobacco. He reports current alcohol use of about 14.0 standard drinks per week. He reports current drug use. Drug: Marijuana. ? ?No Known Allergies ? ?Family History  ?Problem Relation Age of Onset  ? Hypertension Mother   ? Hypertension Father   ? Colon cancer Father   ? Diabetes Father   ? ? ?Prior to Admission medications   ?Medication Sig Start Date End Date Taking? Authorizing Provider  ?amLODipine (NORVASC) 10 MG tablet TAKE 1 TABLET BY MOUTH EVERY DAY 04/06/21   McDonough, Si Gaul, PA-C  ?atenolol (TENORMIN) 100 MG tablet TAKE 1 TABLET BY MOUTH EVERY DAY 04/09/21   Lavera Guise, MD  ?colchicine 0.6 MG tablet Take 1 capsule by mouth daily as needed (gout pain) 06/26/20   McDonough, Si Gaul, PA-C  ?fenofibrate (TRICOR) 48 MG tablet TAKE 1 TABLET BY MOUTH EVERY DAY 02/11/21   McDonough, Si Gaul, PA-C  ?hydrochlorothiazide (HYDRODIURIL) 25 MG tablet TAKE 1 TABLET BY MOUTH EVERY DAY 04/09/21   Lavera Guise, MD  ?hydrocortisone cream 1 % Apply 1 application topically daily as needed (rash).    [provider]  ?ibuprofen (ADVIL) 800 MG tablet TAKE 1 TABLET BY MOUTH IN THE MORNING AND AT BEDTIME AS NEEDED 02/08/21   McDonough, Si Gaul, PA-C  ?ondansetron (ZOFRAN) 4 MG tablet Take 1 tablet (4 mg total) by mouth every 8 (eight) hours as needed for nausea or vomiting. 04/12/21   Horton, Alvin Critchley,  DO  ?pantoprazole (PROTONIX) 40 MG tablet Take 1 tablet (40 mg total) by mouth daily as needed (acid reflux). 04/12/21   Horton, Alvin Critchley, DO  ? ? ?Physical Exam: ?Vitals:  ? 05/13/21 0758 05/13/21 1018  ?BP: (!) 161/102 (!) 174/101  ?Pulse: 78 65  ?Resp: 18 18  ?Temp: 98 ?F (36.7 ?C)   ?TempSrc: Oral   ?SpO2: 97% 100%  ? ?General: 51 y.o. male resting in bed in NAD ?Eyes: PERRL, normal sclera ?ENMT: Nares patent w/o discharge, orophaynx clear, dentition normal, ears w/o discharge/lesions/ulcers ?Neck: Supple, trachea midline ?Cardiovascular: RRR, +S1, S2, no  m/g/r, equal pulses throughout ?Respiratory: CTABL, no w/r/r, normal WOB ?GI: BS+, ND, mild epigastric TTP, no masses noted, no organomegaly noted ?MSK: No e/c/c ?Neuro: A&O x 3, no focal deficits ?Psyc: Appropriate interaction and affect, calm/cooperative ? ?Data Reviewed: ? ?K+ 3.4 ?Glucose 154 ?Lipases 1017 ?Hgb 12.1 ? 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 SMA and IMA are widely patent. ?Minor distal aortoiliac atherosclerosis without occlusive process. ?No other acute abdominopelvic vascular finding. ?NON-VASCULAR ?Recurrent mild acute uncomplicated pancreatitis. ? ?EKG: sinus, no st elevations ? ?Assessment and Plan: ?No notes have been filed under this hospital service. ?Service: Hospitalist ?Acute pancreatitis ?    - place in obs, med-surg ?    - NP O except for sips/ice chips ?    - IVF, pain control, anti-emetics, protonix ?    - check lipid panel ?    - CT findings as above ?    - EDPA spoke with general surgery; the question of median arcuate ligament syndrome is outpt follow up, no acute intervention at this time.  ? ?HTN ?    - continue home regimen when off NPO status ?    - will have PRN available ? ?HLD ?    - continue home regimen when off NPO status ?    - check lipid panel ? ?Marijuana abuse ?    - counsel against further use ? ?EtOH abuse ?    - CIWA ?    - counsel against further use ?    - folate, thiamine, MVI ? ?Hyperglycemia ?    - no hx of DM, check A1c ? ?Hypokalemia ?    - check Mg2+, replace K ? ?Normocytic anemia ?    - check iron studes; Hgb stable, follow ? ?Hx of gout ?    - resume home regimen ? ? Advance Care Planning:   Code Status: FULL ? ?Consults: None  ? ?Family Communication: w/ family at bedside ? ?Severity of Illness: ?The appropriate patient status for this  patient is OBSERVATION. Observation status is judged to be reasonable and necessary in order to provide the required intensity of service to ensure the patient's safety. The patient's presenting symptoms, physical exam findings, and initial radiographic and laboratory data in the context of their medical condition is felt to place them at decreased risk for further clinical deterioration. Furthermore, it is anticipated that the patient will be medically stable for discharge from the hospital within 2 midnights of admission.  ? ?Author: ?Jonnie Finner, DO ?05/13/2021 11:36 AM ? ?For on call review [email or web address].  ?

## 2021-05-13 NOTE — ED Provider Notes (Signed)
?[facility] DEPT ?Provider Note ? ? ?CSN: [id number] ?Arrival date & time: 05/13/21  0753 ? ?  ? ?History ? ?Chief Complaint  ?Patient presents with  ? Abdominal Pain  ? ? ?Evan Sullivan is a 51 y.o. male. ? ?51 y.o male with a PMH of pancreatitis, DM, high blood pressure presents to the ED with ongoing abdominal pain intermittently for the past 3 weeks.  Evaluated here in the month of March, reports there was some improvement in symptoms after he was diagnosed with GERD.  Does report he continues to endorse pain along the epigastric and left upper quadrant region.  Pain is exacerbated postprandial.  Does report his last bowel movement was yesterday, there was no blood in his stool.  Does endorse alcohol use, drinking approximately 3-4 drinks weekly.  Also endorses 1 episode of nonbilious, nonbloody emesis prior to arrival in the ED.  No prior surgical intervention of his abdomen.  Fevers, no sick contacts, no chest pain or shortness of breath. ? ?The history is provided by the patient and medical records.  ?Abdominal Pain ?Pain location:  Epigastric and LUQ ?Pain quality: sharp   ?Pain radiates to:  Does not radiate ?Associated symptoms: vomiting   ?Associated symptoms: no chest pain, no chills, no constipation, no diarrhea, no fever, no nausea and no shortness of breath   ? ?  ? ?Home Medications ?Prior to Admission medications   ?Medication Sig Start Date End Date Taking? Authorizing Provider  ?amLODipine (NORVASC) 10 MG tablet TAKE 1 TABLET BY MOUTH EVERY DAY 04/06/21   McDonough, Si Gaul, PA-C  ?atenolol (TENORMIN) 100 MG tablet TAKE 1 TABLET BY MOUTH EVERY DAY 04/09/21   Lavera Guise, MD  ?colchicine 0.6 MG tablet Take 1 capsule by mouth daily as needed (gout pain) 06/26/20   McDonough, Si Gaul, PA-C  ?fenofibrate (TRICOR) 48 MG tablet TAKE 1 TABLET BY MOUTH EVERY DAY 02/11/21   McDonough, Si Gaul, PA-C  ?hydrochlorothiazide (HYDRODIURIL) 25 MG tablet TAKE 1 TABLET BY MOUTH EVERY DAY  04/09/21   Lavera Guise, MD  ?hydrocortisone cream 1 % Apply 1 application topically daily as needed (rash).    [provider]  ?ibuprofen (ADVIL) 800 MG tablet TAKE 1 TABLET BY MOUTH IN THE MORNING AND AT BEDTIME AS NEEDED 02/08/21   McDonough, Si Gaul, PA-C  ?ondansetron (ZOFRAN) 4 MG tablet Take 1 tablet (4 mg total) by mouth every 8 (eight) hours as needed for nausea or vomiting. 04/12/21   Horton, Alvin Critchley, DO  ?pantoprazole (PROTONIX) 40 MG tablet Take 1 tablet (40 mg total) by mouth daily as needed (acid reflux). 04/12/21   Horton, Alvin Critchley, DO  ?   ? ?Allergies    ?Patient has no known allergies.   ? ?Review of Systems   ?Review of Systems  ?Constitutional:  Negative for chills and fever.  ?Respiratory:  Negative for shortness of breath.   ?Cardiovascular:  Negative for chest pain.  ?Gastrointestinal:  Positive for abdominal pain and vomiting. Negative for blood in stool, constipation, diarrhea and nausea.  ?Genitourinary:  Negative for difficulty urinating and flank pain.  ?Musculoskeletal:  Negative for back pain.  ?Skin:  Negative for pallor and wound.  ?Neurological:  Negative for light-headedness and headaches.  ?All other systems reviewed and are negative. ? ?Physical Exam ?Updated Vital Signs ?BP (!) 174/101   Pulse 65   Temp 98 ?F (36.7 ?C) (Oral)   Resp 18   SpO2 100%  ?Physical Exam ?  Vitals and nursing note reviewed.  ?Constitutional:   ?   Appearance: He is well-developed.  ?HENT:  ?   Head: Normocephalic and atraumatic.  ?Cardiovascular:  ?   Rate and Rhythm: Normal rate.  ?Pulmonary:  ?   Effort: Pulmonary effort is normal.  ?   Breath sounds: No wheezing or rales.  ?Abdominal:  ?   General: Abdomen is flat. Bowel sounds are normal. There is distension.  ?   Tenderness: There is abdominal tenderness in the epigastric area and left upper quadrant. There is no right CVA tenderness or left CVA tenderness.  ?   Hernia: No hernia is present.  ?Skin: ?   General: Skin is warm and dry.   ?Neurological:  ?   Mental Status: He is alert and oriented to person, place, and time.  ? ? ?ED Results / Procedures / Treatments   ?Labs ?(all labs ordered are listed, but only abnormal results are displayed) ?Labs Reviewed  ?CBC WITH DIFFERENTIAL/PLATELET - Abnormal; Notable for the following components:  ?    Result Value  ? RBC 4.20 (*)   ? Hemoglobin 12.1 (*)   ? HCT 36.5 (*)   ? Platelets 424 (*)   ? All other components within normal limits  ?COMPREHENSIVE METABOLIC PANEL - Abnormal; Notable for the following components:  ? Sodium 134 (*)   ? Potassium 3.4 (*)   ? Glucose, Bld 154 (*)   ? Total Protein 8.3 (*)   ? All other components within normal limits  ?LIPASE, BLOOD - Abnormal; Notable for the following components:  ? Lipase 1,017 (*)   ? All other components within normal limits  ?LACTIC ACID, PLASMA  ?LACTIC ACID, PLASMA  ? ? ?EKG ?None ? ?Radiology ?CT Angio Abd/Pel W and/or Wo Contrast ? 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 Multiplanar reconstructed images and MIPs were obtained and reviewed to evaluate the vascular anatomy. RADIATION DOSE REDUCTION: This exam was performed according to the departmental dose-optimization program which includes automated exposure control, adjustment of the mA and/or kV according to patient size and/or use of iterative reconstruction technique. CONTRAST:  143m OMNIPAQUE IOHEXOL 350 MG/ML SOLN COMPARISON:  06/22/2019 FINDINGS: VASCULAR Aorta: Trace distal aortic bifurcation atherosclerosis. Negative for aneurysm, dissection, or acute aortic process. No surrounding inflammatory change. No retroperitoneal hemorrhage or hematoma. Celiac: Redemonstration of a  celiac ostial stenosis but the celiac branches do remain patent including the splenic, left gastric, and common hepatic vasculature. There is enlargement of the pancreaticoduodenal arcade and gastroduodenal artery supporting this is a chronic process with development of collateral pathways via the SMA. On the coronal view, image 104/11 the celiac origin stenosis may be related to median arcuate ligament syndrome. SMA: Widely patent origin including its branches and the described pancreaticoduodenal/GDA enlarged collateral pathway. Renals: Widely patent origins.  No accessory renal artery. IMA: Remains widely patent off the distal aorta including its branches. Inflow: Common iliac atherosclerosis with calcifications bilaterally but the common, internal, and external iliac arteries all remain patent the pelvis. No inflow disease or occlusion. No acute iliac vascular process. Proximal Outflow: The visualized common femoral, proximal profunda femoral, and proximal superficial femoral arteries are all patent. Veins: No veno-occlusive process appreciated. Review of the MIP images confirms the  above findings. NON-VASCULAR Lower chest: Minor subsegmental atelectasis or scarring in the inferior right middle lobe and lingula. Normal heart size. No pericardial or pleural effusion. No acute lower chest finding. Hepatobiliary: No focal liver abnormality is seen. No gallstones, gallbladder wall thickening, or biliary dilatation. Pancreas: Chronic calcifications of the pancreas head and uncinate process. Strandy edema/fluid about the entire pancreas compatible with mild acute pancreatitis. No other complicating feature. No surrounding fluid collection. No ductal dilatation. Pancreas appears to enhance normally. No vascular complication. Spleen: Normal in size without focal abnormality. Adrenals/Urinary Tract: Normal adrenal glands. Subcentimeter scattered right kidney cortical hypodense cysts. No further follow-up imaging  recommended. No acute renal abnormality, obstructive uropathy, hydronephrosis, or hydroureter. No obstructing ureteral calculus. Bladder unremarkable. Stomach/Bowel: Negative for bowel obstruction, free air, free flu

## 2021-05-13 NOTE — ED Triage Notes (Signed)
Per pt, states abdominal pain on and off for 3 weeks-states he was seen in ED for same symptoms and was diagnosed with GERD-states he does not feel his food is getting digested-last BM was yesterday ?

## 2021-05-13 NOTE — Plan of Care (Signed)

## 2021-05-13 NOTE — ED Notes (Signed)
Family updated as to patient's status.

## 2021-05-13 NOTE — ED Notes (Signed)
Urine requested  ?

## 2021-05-14 DIAGNOSIS — Z8249 Family history of ischemic heart disease and other diseases of the circulatory system: Secondary | ICD-10-CM | POA: Diagnosis not present

## 2021-05-14 DIAGNOSIS — E876 Hypokalemia: Secondary | ICD-10-CM | POA: Diagnosis present

## 2021-05-14 DIAGNOSIS — M109 Gout, unspecified: Secondary | ICD-10-CM | POA: Diagnosis present

## 2021-05-14 DIAGNOSIS — F1210 Cannabis abuse, uncomplicated: Secondary | ICD-10-CM | POA: Diagnosis present

## 2021-05-14 DIAGNOSIS — E1165 Type 2 diabetes mellitus with hyperglycemia: Secondary | ICD-10-CM | POA: Diagnosis present

## 2021-05-14 DIAGNOSIS — K852 Alcohol induced acute pancreatitis without necrosis or infection: Secondary | ICD-10-CM

## 2021-05-14 DIAGNOSIS — K859 Acute pancreatitis without necrosis or infection, unspecified: Secondary | ICD-10-CM | POA: Diagnosis present

## 2021-05-14 DIAGNOSIS — I10 Essential (primary) hypertension: Secondary | ICD-10-CM

## 2021-05-14 DIAGNOSIS — D649 Anemia, unspecified: Secondary | ICD-10-CM

## 2021-05-14 DIAGNOSIS — K219 Gastro-esophageal reflux disease without esophagitis: Secondary | ICD-10-CM | POA: Diagnosis present

## 2021-05-14 DIAGNOSIS — F1010 Alcohol abuse, uncomplicated: Secondary | ICD-10-CM

## 2021-05-14 DIAGNOSIS — Z79899 Other long term (current) drug therapy: Secondary | ICD-10-CM | POA: Diagnosis not present

## 2021-05-14 DIAGNOSIS — Z833 Family history of diabetes mellitus: Secondary | ICD-10-CM | POA: Diagnosis not present

## 2021-05-14 DIAGNOSIS — E785 Hyperlipidemia, unspecified: Secondary | ICD-10-CM | POA: Diagnosis present

## 2021-05-14 DIAGNOSIS — Z8711 Personal history of peptic ulcer disease: Secondary | ICD-10-CM | POA: Diagnosis not present

## 2021-05-14 DIAGNOSIS — Z800 Family history of malignant neoplasm of digestive organs: Secondary | ICD-10-CM | POA: Diagnosis not present

## 2021-05-14 DIAGNOSIS — I708 Atherosclerosis of other arteries: Secondary | ICD-10-CM | POA: Diagnosis present

## 2021-05-14 DIAGNOSIS — Z8739 Personal history of other diseases of the musculoskeletal system and connective tissue: Secondary | ICD-10-CM | POA: Diagnosis not present

## 2021-05-14 DIAGNOSIS — Z96641 Presence of right artificial hip joint: Secondary | ICD-10-CM | POA: Diagnosis present

## 2021-05-14 DIAGNOSIS — I774 Celiac artery compression syndrome: Secondary | ICD-10-CM | POA: Diagnosis present

## 2021-05-14 DIAGNOSIS — D509 Iron deficiency anemia, unspecified: Secondary | ICD-10-CM | POA: Diagnosis present

## 2021-05-14 LAB — LIPID PANEL
Cholesterol: 160 mg/dL (ref 0–200)
HDL: 47 mg/dL (ref >40)
LDL Cholesterol: 82 mg/dL (ref 0–99)
Total CHOL/HDL Ratio: 3.4 RATIO
Triglycerides: 156 mg/dL — ABNORMAL HIGH (ref <150)
VLDL: 31 mg/dL (ref 0–40)

## 2021-05-14 LAB — CBC
HCT: 37.2 % — ABNORMAL LOW (ref 39.0–52.0)
Hemoglobin: 12 g/dL — ABNORMAL LOW (ref 13.0–17.0)
MCH: 28.7 pg (ref 26.0–34.0)
MCHC: 32.3 g/dL (ref 30.0–36.0)
MCV: 89 fL (ref 80.0–100.0)
Platelets: 387 10*3/uL (ref 150–400)
RBC: 4.18 MIL/uL — ABNORMAL LOW (ref 4.22–5.81)
RDW: 13.2 % (ref 11.5–15.5)
WBC: 8.4 10*3/uL (ref 4.0–10.5)
nRBC: 0 % (ref 0.0–0.2)

## 2021-05-14 LAB — COMPREHENSIVE METABOLIC PANEL
ALT: 15 U/L (ref 0–44)
AST: 19 U/L (ref 15–41)
Albumin: 4.1 g/dL (ref 3.5–5.0)
Alkaline Phosphatase: 48 U/L (ref 38–126)
Anion gap: 8 (ref 5–15)
BUN: 12 mg/dL (ref 6–20)
CO2: 24 mmol/L (ref 22–32)
Calcium: 9.3 mg/dL (ref 8.9–10.3)
Chloride: 104 mmol/L (ref 98–111)
Creatinine, Ser: 0.98 mg/dL (ref 0.61–1.24)
GFR, Estimated: >60 mL/min (ref >60)
Glucose, Bld: 117 mg/dL — ABNORMAL HIGH (ref 70–99)
Potassium: 3.7 mmol/L (ref 3.5–5.1)
Sodium: 136 mmol/L (ref 135–145)
Total Bilirubin: 0.7 mg/dL (ref 0.3–1.2)
Total Protein: 7.7 g/dL (ref 6.5–8.1)

## 2021-05-14 LAB — IRON AND TIBC
Iron: 13 ug/dL — ABNORMAL LOW (ref 45–182)
Saturation Ratios: 2 % — ABNORMAL LOW (ref 17.9–39.5)
TIBC: 572 ug/dL — ABNORMAL HIGH (ref 250–450)
UIBC: 559 ug/dL

## 2021-05-14 LAB — HIV ANTIBODY (ROUTINE TESTING W REFLEX): HIV Screen 4th Generation wRfx: NONREACTIVE

## 2021-05-14 LAB — MAGNESIUM: Magnesium: 2.2 mg/dL (ref 1.7–2.4)

## 2021-05-14 LAB — LIPASE, BLOOD: Lipase: 774 U/L — ABNORMAL HIGH (ref 11–51)

## 2021-05-14 LAB — HEMOGLOBIN A1C
Hgb A1c MFr Bld: 6 % — ABNORMAL HIGH (ref 4.8–5.6)
Mean Plasma Glucose: 125.5 mg/dL

## 2021-05-14 MED ORDER — LACTATED RINGERS IV SOLN: INTRAVENOUS | Status: DC

## 2021-05-14 MED ORDER — LACTATED RINGERS IV SOLN: INTRAVENOUS | Status: AC

## 2021-05-14 NOTE — Progress Notes (Signed)
[facility] ? ?PROGRESS NOTE ? ?Evan Sullivan [id number] DOB: [birth year] DOA: 05/13/2021 ?PCP: Mylinda Latina, PA-C ? ? ?Brief HPI:   ?51 year old male with medical history of hypertension, hyperlipidemia, gout, presents with epigastric pain.  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  SMA and IMA are widely patent.  Also showed recurrent mild acute uncomplicated pancreatitis.  General surgery was consulted and recommended they will follow-up as outpatient for median arcuate ligament syndrome.  Patient started on IV fluids kept n.p.o. for pancreatitis. ? ? ? ?Subjective  ? ?Patient seen and examined, abdominal pain is improved.  Lipase down to 744. ? ? Assessment/Plan:  ? ? ? ?Acute pancreatitis ?-Likely alcohol induced, patient drinks alcohol 3-4 times a week ?-Lipase down to 744, continue LR at 150 mill per hour ?-We will start clear liquid diet ?-Follow lipase in a.m. ? ?Median arcuate ligament syndrome ?-Seen on CT abdomen/pelvis ?-General surgery has seen the imaging and would like to see him as outpatient ? ?Hyperlipidemia ?-Triglyceride 156 ?-Continue fenofibrate ? ?Alcohol abuse ?-Continue CIWA protocol ?-Continue folate, thiamine ? ?Hypokalemia ?-Replete ? ?Normocytic anemia ?-Serum iron 13 ?-Iron saturation 2% ?-We will check FOBT ?-Might need GI evaluation ? ?Hypertension ?-Continue amlodipine, atenolol ?- ? ?Medications ? ?  ? amLODipine  10 mg Oral Daily  ? atenolol  100 mg Oral Daily  ? enoxaparin (LOVENOX) injection  40 mg Subcutaneous QHS  ? fenofibrate  54 mg Oral Daily  ? folic acid  1 mg Oral Daily  ? multivitamin with minerals  1 tablet Oral Daily  ? pantoprazole (PROTONIX) IV  40 mg Intravenous Q24H  ? potassium chloride  40 mEq Oral Daily  ? thiamine  100 mg Oral Daily  ? Or  ? thiamine  100 mg Intravenous  Daily  ? ? ? Data Reviewed:  ? ?CBG: ? ?No results for input(s): GLUCAP in the last 168 hours. ? ?SpO2: 100 %  ? ? ?Vitals:  ? 05/13/21 2129 05/13/21 2214 05/14/21 0057 05/14/21 0451  ?BP: (!) 149/86  (!) 166/109 (!) 149/84  ?Pulse: 63  79 61  ?Resp: '14  18 20  '$ ?Temp: 98.4 ?F (36.9 ?C)  97.9 ?F (36.6 ?C) 98.2 ?F (36.8 ?C)  ?TempSrc: Oral     ?SpO2: 100%  100% 100%  ?Weight:  97.2 kg    ?Height:  '6\' 1"'$  (1.854 m)    ? ? ? ? ?Data Reviewed: ? ?Basic Metabolic Panel: ?Recent Labs  ?Lab 05/13/21 ?0807 05/14/21 ?5038 05/14/21 ?8828  ?NA 134*  --  136  ?K 3.4*  --  3.7  ?CL 101  --  104  ?CO2 22  --  24  ?GLUCOSE 154*  --  117*  ?BUN 14  --  12  ?CREATININE 0.94  --  0.98  ?CALCIUM 9.6  --  9.3  ?MG  --  2.2  --   ? ? ?CBC: ?Recent Labs  ?Lab 05/13/21 ?0803 05/14/21 ?0034  ?WBC 8.4 8.4  ?NEUTROABS 7.1  --   ?HGB 12.1* 12.0*  ?HCT 36.5* 37.2*  ?MCV 86.9 89.0  ?PLT 424* 387  ? ? ?LFT ?Recent Labs  ?Lab 05/13/21 ?0807 05/14/21 ?9179  ?AST 21 19  ?ALT 19 15  ?ALKPHOS 49 48  ?BILITOT 0.5 0.7  ?PROT 8.3* 7.7  ?ALBUMIN 4.7 4.1  ? ?  ?Antibiotics: ?Anti-infectives (  From admission, onward)  ? ? None  ? ?  ? ? ? ?DVT prophylaxis: Lovenox ? ?Code Status: Full code ? ?Family Communication: No family at bedside ? ? ?CONSULTS  ? ? ?Objective  ? ? ?Physical Examination: ? ? ?General-appears in no acute distress ?Heart-S1-S2, regular, no murmur auscultated ?Lungs-clear to auscultation bilaterally, no wheezing or crackles auscultated ?Abdomen-soft, nontender, no organomegaly ?Extremities-no edema in the lower extremities ?Neuro-alert, oriented x3, no focal deficit noted ? ? ?Status is: Inpatient: Acute pancreatitis ? ? ? ?  ? ? ?Evan Sullivan ?  ?[facility] ?If 7PM-7AM, please contact night-coverage at [email or web address], ?Office  [phone] ? ? ?05/14/2021, 3:30 PM  LOS: 0 days  ? ? ? ? ? ? ? ? ? ? ?  ?

## 2021-05-15 DIAGNOSIS — K852 Alcohol induced acute pancreatitis without necrosis or infection: Secondary | ICD-10-CM | POA: Diagnosis not present

## 2021-05-15 DIAGNOSIS — F1010 Alcohol abuse, uncomplicated: Secondary | ICD-10-CM | POA: Diagnosis not present

## 2021-05-15 DIAGNOSIS — D649 Anemia, unspecified: Secondary | ICD-10-CM | POA: Diagnosis not present

## 2021-05-15 DIAGNOSIS — I10 Essential (primary) hypertension: Secondary | ICD-10-CM | POA: Diagnosis not present

## 2021-05-15 LAB — BASIC METABOLIC PANEL
Anion gap: 6 (ref 5–15)
BUN: 12 mg/dL (ref 6–20)
CO2: 25 mmol/L (ref 22–32)
Calcium: 8.9 mg/dL (ref 8.9–10.3)
Chloride: 103 mmol/L (ref 98–111)
Creatinine, Ser: 0.81 mg/dL (ref 0.61–1.24)
GFR, Estimated: >60 mL/min (ref >60)
Glucose, Bld: 127 mg/dL — ABNORMAL HIGH (ref 70–99)
Potassium: 3.5 mmol/L (ref 3.5–5.1)
Sodium: 134 mmol/L — ABNORMAL LOW (ref 135–145)

## 2021-05-15 LAB — LIPASE, BLOOD: Lipase: 152 U/L — ABNORMAL HIGH (ref 11–51)

## 2021-05-15 MED ORDER — HYDROMORPHONE HCL 1 MG/ML IJ SOLN
1.0000 mg | INTRAMUSCULAR | Status: DC | PRN
  Administered 2021-05-15 – 2021-05-16 (×4): 1 mg via INTRAVENOUS
  Filled 2021-05-15 (×5): qty 1

## 2021-05-15 NOTE — Progress Notes (Signed)
[facility] ? ?PROGRESS NOTE ? ?Evan Sullivan [id number]:[id number] DOB: 22-Oct-1970 DOA: 05/13/2021 ?PCP: Mylinda Latina, PA-C ? ? ?Brief HPI:   ?51 year old male with medical history of hypertension, hyperlipidemia, gout, presents with epigastric pain.  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  SMA and IMA are widely patent.  Also showed recurrent mild acute uncomplicated pancreatitis.  General surgery was consulted and recommended they will follow-up as outpatient for median arcuate ligament syndrome.  Patient started on IV fluids kept n.p.o. for pancreatitis. ? ? ? ?Subjective  ? ?Patient seen and examined, feels better this morning.Lipase down to 152.  Tolerated clear liquid diet ? ? Assessment/Plan:  ? ? ? ?Acute pancreatitis ?-Likely alcohol induced, patient drinks alcohol 3-4 times a week ?-Lipase down to 744, continue LR at 150 mill per hour ?-Tolerated clear liquid diet, will switch to soft diet ?-Follow lipase in a.m. ? ?Median arcuate ligament syndrome ?-Seen on CT abdomen/pelvis ?-General surgery has seen the imaging and would like to see him as outpatient ? ?Hyperlipidemia ?-Triglyceride 156 ?-Continue fenofibrate ? ?Alcohol abuse ?-Continue CIWA protocol ?-Continue folate, thiamine ? ?Hypokalemia ?-Replete ? ?Normocytic anemia ?-Serum iron 13 ?-Iron saturation 2% ?-He denies black stool ?-Patient had colonoscopy in 2021Which showed 3 polyps which were removed ?-He is supposed to undergo colonoscopy this year ?-Recommended to discuss with PCP that he will also need a EGD ?-We will start him on oral iron at discharge low ? ?Hypertension ?-Continue amlodipine, atenolol ? ? ?Medications ? ?  ? amLODipine  10 mg Oral Daily  ? atenolol  100 mg Oral Daily  ? enoxaparin (LOVENOX) injection  40 mg Subcutaneous QHS  ? fenofibrate  54  mg Oral Daily  ? folic acid  1 mg Oral Daily  ? multivitamin with minerals  1 tablet Oral Daily  ? pantoprazole (PROTONIX) IV  40 mg Intravenous Q24H  ? potassium chloride  40 mEq Oral Daily  ? thiamine  100 mg Oral Daily  ? Or  ? thiamine  100 mg Intravenous Daily  ? ? ? Data Reviewed:  ? ?CBG: ? ?No results for input(s): GLUCAP in the last 168 hours. ? ?SpO2: 100 %  ? ? ?Vitals:  ? 05/14/21 0451 05/14/21 1508 05/14/21 2111 05/15/21 0344  ?BP: (!) 149/84 136/82 (!) 143/92 (!) 113/100  ?Pulse: 61 64 68 69  ?Resp: '20 18 18 16  '$ ?Temp: 98.2 ?F (36.8 ?C) 98.2 ?F (36.8 ?C) 99 ?F (37.2 ?C) 97.7 ?F (36.5 ?C)  ?TempSrc:  Oral  Oral  ?SpO2: 100% 99% 100% 100%  ?Weight:      ?Height:      ? ? ? ? ?Data Reviewed: ? ?Basic Metabolic Panel: ?Recent Labs  ?Lab 05/13/21 ?0807 05/14/21 ?3491 05/14/21 ?7915 05/15/21 ?0920  ?NA 134*  --  136 134*  ?K 3.4*  --  3.7 3.5  ?CL 101  --  104 103  ?CO2 22  --  24 25  ?GLUCOSE 154*  --  117* 127*  ?BUN 14  --  12 12  ?CREATININE 0.94  --  0.98 0.81  ?CALCIUM 9.6  --  9.3 8.9  ?MG  --  2.2  --   --   ? ? ?CBC: ?Recent Labs  ?Lab 05/13/21 ?0803 05/14/21 ?0569  ?WBC 8.4 8.4  ?NEUTROABS 7.1  --   ?HGB 12.1*  12.0*  ?HCT 36.5* 37.2*  ?MCV 86.9 89.0  ?PLT 424* 387  ? ? ?LFT ?Recent Labs  ?Lab 05/13/21 ?0807 05/14/21 ?8811  ?AST 21 19  ?ALT 19 15  ?ALKPHOS 49 48  ?BILITOT 0.5 0.7  ?PROT 8.3* 7.7  ?ALBUMIN 4.7 4.1  ? ?  ?Antibiotics: ?Anti-infectives (From admission, onward)  ? ? None  ? ?  ? ? ? ?DVT prophylaxis: Lovenox ? ?Code Status: Full code ? ?Family Communication: No family at bedside ? ? ?CONSULTS  ? ? ?Objective  ? ? ?Physical Examination: ? ? ?General-appears in no acute distress ?Heart-S1-S2, regular, no murmur auscultated ?Lungs-clear to auscultation bilaterally, no wheezing or crackles auscultated ?Abdomen-soft, nontender, no organomegaly ?Extremities-no edema in the lower extremities ?Neuro-alert, oriented x3, no focal deficit noted ? ? ?Status is: Inpatient: Acute pancreatitis ? ? ? ?   ? ? ?Evan Sullivan ?  ?[facility] ?If 7PM-7AM, please contact night-coverage at [email or web address], ?Office  [phone] ? ? ?05/15/2021, 1:51 PM  LOS: 1 day  ? ? ? ? ? ? ? ? ? ? ?  ?

## 2021-05-16 DIAGNOSIS — I10 Essential (primary) hypertension: Secondary | ICD-10-CM | POA: Diagnosis not present

## 2021-05-16 DIAGNOSIS — Z8739 Personal history of other diseases of the musculoskeletal system and connective tissue: Secondary | ICD-10-CM

## 2021-05-16 DIAGNOSIS — K852 Alcohol induced acute pancreatitis without necrosis or infection: Secondary | ICD-10-CM | POA: Diagnosis not present

## 2021-05-16 DIAGNOSIS — F1010 Alcohol abuse, uncomplicated: Secondary | ICD-10-CM | POA: Diagnosis not present

## 2021-05-16 LAB — BASIC METABOLIC PANEL
Anion gap: 8 (ref 5–15)
BUN: 10 mg/dL (ref 6–20)
CO2: 24 mmol/L (ref 22–32)
Calcium: 9.1 mg/dL (ref 8.9–10.3)
Chloride: 102 mmol/L (ref 98–111)
Creatinine, Ser: 0.8 mg/dL (ref 0.61–1.24)
GFR, Estimated: >60 mL/min (ref >60)
Glucose, Bld: 115 mg/dL — ABNORMAL HIGH (ref 70–99)
Potassium: 4.1 mmol/L (ref 3.5–5.1)
Sodium: 134 mmol/L — ABNORMAL LOW (ref 135–145)

## 2021-05-16 LAB — LIPASE, BLOOD: Lipase: 116 U/L — ABNORMAL HIGH (ref 11–51)

## 2021-05-16 MED ORDER — FERROUS GLUCONATE 324 (38 FE) MG PO TABS: 324.0000 mg | ORAL_TABLET | Freq: Every day | ORAL | 3 refills | Status: DC

## 2021-05-16 MED ORDER — SENNOSIDES-DOCUSATE SODIUM 8.6-50 MG PO TABS
2.0000 | ORAL_TABLET | Freq: Two times a day (BID) | ORAL | Status: DC
  Administered 2021-05-16: 2 via ORAL
  Filled 2021-05-16: qty 2

## 2021-05-16 MED ORDER — POLYETHYLENE GLYCOL 3350 17 G PO PACK: 17.0000 g | PACK | Freq: Every day | ORAL | 0 refills | Status: DC | PRN

## 2021-05-16 MED ORDER — FOLIC ACID 1 MG PO TABS: 1.0000 mg | ORAL_TABLET | Freq: Every day | ORAL | 1 refills | Status: DC

## 2021-05-16 MED ORDER — POLYETHYLENE GLYCOL 3350 17 G PO PACK
17.0000 g | PACK | Freq: Every day | ORAL | Status: DC
  Administered 2021-05-16: 17 g via ORAL
  Filled 2021-05-16: qty 1

## 2021-05-16 MED ORDER — THIAMINE HCL 100 MG PO TABS: 100.0000 mg | ORAL_TABLET | Freq: Every day | ORAL | 1 refills | Status: DC

## 2021-05-16 MED ORDER — FERROUS GLUCONATE 324 (38 FE) MG PO TABS
324.0000 mg | ORAL_TABLET | Freq: Every day | ORAL | Status: DC
  Administered 2021-05-16: 324 mg via ORAL
  Filled 2021-05-16: qty 1

## 2021-05-16 NOTE — Discharge Summary (Signed)
?Physician Discharge Summary ?  ?Patient: Evan Sullivan MRN: [id number] DOB: 08/01/1970  ?Admit date:     05/13/2021  ?Discharge date: 05/16/21  ?Discharge Physician: Oswald Hillock  ? ?PCP: Mylinda Latina, PA-C  ? ?Recommendations at discharge:  ? ?Follow-up PCP in 1 week ?Will need EGD and colonoscopy as outpatient ?Has iron deficiency anemia, will discharge on serum iron. ? ?Discharge Diagnoses: ?Principal Problem: ?  Pancreatitis ?Active Problems: ?  Essential hypertension ?  Normocytic anemia ?  Marijuana abuse ?  Alcohol abuse ?  Hypokalemia ?  History of gout ?  HLD (hyperlipidemia) ?  Hyperglycemia ?  Acute pancreatitis ? ?Resolved Problems: ?  * No resolved hospital problems. * ? ?Hospital Course: ?51 year old male with medical history of hypertension, hyperlipidemia, gout, presents with epigastric pain.  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  SMA and IMA are widely patent.  Also showed recurrent mild acute uncomplicated pancreatitis.  General surgery was consulted and recommended they will follow-up as outpatient for median arcuate ligament syndrome.  Patient started on IV fluids kept n.p.o. for pancreatitis. ?  ? ?Assessment and Plan: ? ?Acute pancreatitis ?-alcohol induced, patient drinks alcohol 3-4 times a week ?-Lipase down to 116, tolerating soft diet well ? ?  ?Median arcuate ligament syndrome ?-Seen on CT abdomen/pelvis ?-General surgery has seen the imaging and would like to see him as outpatient ?  ?Hyperlipidemia ?-Triglyceride 156 ?-Continue fenofibrate ?  ?Alcohol abuse ?-He was started on  CIWA protocol ?-No signs and symptoms of alcohol withdrawal ?-Continue folate, thiamine ?  ?Hypokalemia ?-Replete ?  ?Normocytic anemia ?-Serum iron 13 ?-Iron saturation 2% ?-He denies black stool ?-Patient had colonoscopy in  2021Which showed 3 polyps which were removed ?-He is supposed to undergo colonoscopy this year ?-Recommended to discuss with PCP that he will also need a EGD ?-We will start him on oral iron at discharge  ?  ?Hypertension ?-Continue amlodipine, atenolol ?-Discontinue hydrochlorothiazide due to pancreatitis ?  ? ?  ? ? ?Consultants:  ?Procedures performed:  ?Disposition: Home ?Diet recommendation:  ?Discharge Diet Orders (From admission, onward)  ? ?  Start     Ordered  ? 05/16/21 0000  Diet - low sodium heart healthy       ? 05/16/21 1239  ? ?  ?  ? ?  ? ?Regular diet ?DISCHARGE MEDICATION: ?Allergies as of 05/16/2021   ?No Known Allergies ?  ? ?  ?Medication List  ?  ? ?STOP taking these medications   ? ?hydrochlorothiazide 25 MG tablet ?Commonly known as: HYDRODIURIL ?  ?ibuprofen 800 MG tablet ?Commonly known as: ADVIL ?  ? ?  ? ?TAKE these medications   ? ?amLODipine 10 MG tablet ?Commonly known as: NORVASC ?TAKE 1 TABLET BY MOUTH EVERY DAY ?  ?atenolol 100 MG tablet ?Commonly known as: TENORMIN ?TAKE 1 TABLET BY MOUTH EVERY DAY ?  ?colchicine 0.6 MG tablet ?Take 1 capsule by mouth daily as needed (gout pain) ?  ?fenofibrate 48 MG tablet ?Commonly known as: TRICOR ?TAKE 1 TABLET BY MOUTH EVERY DAY ?  ?ferrous gluconate 324 MG tablet ?Commonly known as: FERGON ?Take 1 tablet (324 mg total) by mouth daily with breakfast. ?Start taking on: May 18, 4330 ?  ?folic acid 1 MG tablet ?Commonly known as: FOLVITE ?Take 1 tablet (1 mg total) by mouth daily. ?Start  taking on: May 17, 2021 ?  ?hydrocortisone cream 1 % ?Apply 1 application topically daily as needed (rash). ?  ?ondansetron 4 MG tablet ?Commonly known as: ZOFRAN ?Take 1 tablet (4 mg total) by mouth every 8 (eight) hours as needed for nausea or vomiting. ?  ?pantoprazole 40 MG tablet ?Commonly known as: PROTONIX ?Take 1 tablet (40 mg total) by mouth daily as needed (acid reflux). ?  ?polyethylene glycol 17 g packet ?Commonly known as: MIRALAX / GLYCOLAX ?Take  17 g by mouth daily as needed for moderate constipation. ?  ?thiamine 100 MG tablet ?Take 1 tablet (100 mg total) by mouth daily. ?Start taking on: May 17, 2021 ?  ? ?  ? ? ?Discharge Exam: ?Filed Weights  ? 05/13/21 2214  ?Weight: 97.2 kg  ? ?General-appears in no acute distress ?Heart-S1-S2, regular, no murmur auscultated ?Lungs-clear to auscultation bilaterally, no wheezing or crackles auscultated ?Abdomen-soft, nontender, no organomegaly ?Extremities-no edema in the lower extremities ?Neuro-alert, oriented x3, no focal deficit noted ? ?Condition at discharge: good ? ?The results of significant diagnostics from this hospitalization (including imaging, microbiology, ancillary and laboratory) are listed below for reference.  ? ?Imaging Studies: ?CT Angio Abd/Pel W and/or Wo Contrast ? 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 Multiplanar reconstructed images and MIPs were obtained and reviewed to evaluate the vascular anatomy. RADIATION DOSE REDUCTION: This exam was performed according to the departmental dose-optimization program which includes automated exposure control, adjustment of the mA and/or kV according to patient size and/or use of iterative reconstruction technique. CONTRAST:  123m OMNIPAQUE IOHEXOL 350 MG/ML SOLN COMPARISON:  06/22/2019 FINDINGS: VASCULAR Aorta: Trace distal aortic bifurcation atherosclerosis. Negative for aneurysm, dissection, or acute aortic process. No surrounding inflammatory change. No retroperitoneal hemorrhage or hematoma. Celiac: Redemonstration of a celiac ostial stenosis but the celiac branches do remain patent including the splenic, left gastric, and common hepatic  vasculature. There is enlargement of the pancreaticoduodenal arcade and gastroduodenal artery supporting this is a chronic process with development of collateral pathways via the SMA. On the coronal view, image 104/11 the celiac origin stenosis may be related to median arcuate ligament syndrome. SMA: Widely patent origin including its branches and the described pancreaticoduodenal/GDA enlarged collateral pathway. Renals: Widely patent origins.  No accessory renal artery. IMA: Remains widely patent off the distal aorta including its branches. Inflow: Common iliac atherosclerosis with calcifications bilaterally but the common, internal, and external iliac arteries all remain patent the pelvis. No inflow disease or occlusion. No acute iliac vascular process. Proximal Outflow: The visualized common femoral, proximal profunda femoral, and proximal superficial femoral arteries are all patent. Veins: No veno-occlusive process appreciated. Review of the MIP images confirms the above findings. NON-VASCULAR Lower chest: Minor subsegmental atelectasis or scarring in the inferior right middle lobe and lingula. Normal heart size. No pericardial or pleural effusion. No acute lower chest finding. Hepatobiliary: No focal liver abnormality is seen. No gallstones, gallbladder wall thickening, or biliary dilatation. Pancreas: Chronic calcifications of the pancreas head and uncinate process. Strandy edema/fluid about the entire pancreas compatible with mild acute pancreatitis. No other complicating feature. No surrounding fluid collection. No ductal dilatation. Pancreas appears to enhance normally. No vascular complication. Spleen: Normal in size without focal abnormality. Adrenals/Urinary Tract: Normal adrenal glands.  Subcentimeter scattered right kidney cortical hypodense cysts. No further follow-up imaging recommended. No acute renal abnormality, obstructive uropathy, hydronephrosis, or hydroureter. No obstructing ureteral calculus.  Bladder unremarkable. Stomach/Bowel: Negative for bowel obstruction, free air, free fluid, ascites, or abscess. There are a few dilated loops of proximal jejunum in the left upper quadrant anterior to

## 2021-05-16 NOTE — Progress Notes (Signed)
Patient discharged home with wife. Belongings were returned. Education on medication was provided.  ?

## 2021-05-19 ENCOUNTER — Encounter: Payer: Self-pay | Admitting: Nurse Practitioner

## 2021-05-19 ENCOUNTER — Ambulatory Visit (INDEPENDENT_AMBULATORY_CARE_PROVIDER_SITE_OTHER): Payer: BC Managed Care – PPO | Admitting: Nurse Practitioner

## 2021-05-19 VITALS — BP 128/71 | HR 71 | Temp 98.0°F | Ht 72.84 in | Wt 223.2 lb

## 2021-05-19 DIAGNOSIS — K861 Other chronic pancreatitis: Secondary | ICD-10-CM | POA: Diagnosis not present

## 2021-05-19 DIAGNOSIS — Z09 Encounter for follow-up examination after completed treatment for conditions other than malignant neoplasm: Secondary | ICD-10-CM

## 2021-05-19 DIAGNOSIS — D500 Iron deficiency anemia secondary to blood loss (chronic): Secondary | ICD-10-CM | POA: Diagnosis not present

## 2021-05-19 DIAGNOSIS — Z7689 Persons encountering health services in other specified circumstances: Secondary | ICD-10-CM

## 2021-05-19 DIAGNOSIS — I10 Essential (primary) hypertension: Secondary | ICD-10-CM

## 2021-05-19 DIAGNOSIS — K219 Gastro-esophageal reflux disease without esophagitis: Secondary | ICD-10-CM | POA: Diagnosis not present

## 2021-05-19 DIAGNOSIS — Z6829 Body mass index (BMI) 29.0-29.9, adult: Secondary | ICD-10-CM

## 2021-05-19 NOTE — Progress Notes (Signed)
? ?New Patient Office Visit ? ?Subjective:  ?Patient ID: Evan Sullivan, male    DOB: 1971/01/12  Age: 51 y.o. MRN: [id number] ? ?CC:  ?Chief Complaint  ?Patient presents with  ? New Patient (Initial Visit)  ? ? ?HPI ?Evan Sullivan presents to establish new primary care provider.  ?-recently hospitalized for pancreatitis. In hospital from 05/13/2021 through 05/16/2021.  ?-continues to have some mild abdominal pain. Eating little and has fatigue.  ?-discharged on iron supplement due to iron deficiency anemia. Making the abdominal pain worse.  ?-recommended to follow up with GI provider to have EGD and colonoscopy.  ? ?Past Medical History:  ?Diagnosis Date  ? Arthritis   ? Avascular necrosis of femoral head, right ([place]) 2019  ? Diabetes mellitus without complication ([place])   ? GERD (gastroesophageal reflux disease)   ? Gout   ? Hypertension   ? Peptic ulcer disease   ? ? ?Past Surgical History:  ?Procedure Laterality Date  ? COLONOSCOPY    ? COLONOSCOPY WITH PROPOFOL N/A 11/30/2019  ? Procedure: COLONOSCOPY WITH PROPOFOL;  Surgeon: Lin Landsman, MD;  Location: [facility];  Service: Endoscopy;  Laterality: N/A;  priority 4  ? KNEE ARTHROSCOPY, MEDIAL PATELLO FEMORAL LIGAMENT REPAIR Bilateral 2004,2000  ? POLYPECTOMY  11/30/2019  ? Procedure: POLYPECTOMY;  Surgeon: Lin Landsman, MD;  Location: [facility];  Service: Endoscopy;;  ? TOTAL HIP ARTHROPLASTY Right 08/02/2017  ? Procedure: TOTAL HIP ARTHROPLASTY;  Surgeon: Dereck Leep, MD;  Location: [facility];  Service: Orthopedics;  Laterality: Right;  ? ? ?Family History  ?Problem Relation Age of Onset  ? Hypertension Mother   ? Hypertension Father   ? Colon cancer Father   ? Diabetes Father   ? ? ?Social History  ? ?Socioeconomic History  ? Marital status: Single  ?  Spouse name: Not on file  ? Number of children: Not on file  ? Years of education: Not on file  ? Highest education level: Not on file  ?Occupational History  ? Not on file  ?Tobacco  Use  ? Smoking status: Never  ? Smokeless tobacco: Never  ?Vaping Use  ? Vaping Use: Never used  ?Substance and Sexual Activity  ? Alcohol use: Yes  ?  Alcohol/week: 14.0 standard drinks  ?  Types: 14 Cans of beer per week  ?  Comment: occ  ? Drug use: Yes  ?  Types: Marijuana  ?  Comment: daily   ? Sexual activity: Yes  ?  Birth control/protection: Condom  ?Other Topics Concern  ? Not on file  ?Social History Narrative  ? Not on file  ? ?Social Determinants of Health  ? ?Financial Resource Strain: Not on file  ?Food Insecurity: Not on file  ?Transportation Needs: Not on file  ?Physical Activity: Not on file  ?Stress: Not on file  ?Social Connections: Not on file  ?Intimate Partner Violence: Not on file  ? ? ?ROS ?Review of Systems  ?Constitutional:  Positive for appetite change and fatigue. Negative for activity change, chills and fever.  ?     Decreased appetite since recent hospitalization.   ?HENT:  Negative for congestion, postnasal drip, rhinorrhea, sinus pressure, sinus pain, sneezing and sore throat.   ?Eyes: Negative.   ?Respiratory:  Negative for cough, shortness of breath and wheezing.   ?Cardiovascular:  Negative for chest pain and palpitations.  ?Gastrointestinal:  Positive for abdominal pain, constipation and nausea. Negative for diarrhea and vomiting.  ?Endocrine: Negative  for cold intolerance, heat intolerance, polydipsia and polyuria.  ?Genitourinary:  Negative for dysuria, frequency and urgency.  ?Musculoskeletal:  Negative for back pain and myalgias.  ?Skin:  Negative for rash.  ?Allergic/Immunologic: Negative for environmental allergies.  ?Neurological:  Negative for dizziness, weakness and headaches.  ?Psychiatric/Behavioral:  The patient is not nervous/anxious.   ? ?Objective:  ? ?Today's Vitals  ? 05/19/21 1447  ?BP: 128/71  ?Pulse: 71  ?Temp: 98 ?F (36.7 ?C)  ?SpO2: 100%  ?Weight: 223 lb 3.2 oz (101.2 kg)  ?Height: 6' 0.84" (1.85 m)  ? ?Body mass index is 29.58 kg/m?.  ? ?Physical  Exam ?Vitals and nursing note reviewed.  ?Constitutional:   ?   Appearance: Normal appearance. He is well-developed.  ?HENT:  ?   Head: Normocephalic and atraumatic.  ?Eyes:  ?   Pupils: Pupils are equal, round, and reactive to light.  ?Cardiovascular:  ?   Rate and Rhythm: Normal rate and regular rhythm.  ?   Pulses: Normal pulses.  ?   Heart sounds: Normal heart sounds.  ?Pulmonary:  ?   Effort: Pulmonary effort is normal.  ?   Breath sounds: Normal breath sounds.  ?Abdominal:  ?   General: Bowel sounds are normal. There is no distension.  ?   Palpations: Abdomen is soft.  ?   Tenderness: There is no abdominal tenderness. There is no right CVA tenderness, left CVA tenderness, guarding or rebound.  ?   Hernia: No hernia is present.  ?Musculoskeletal:     ?   General: Normal range of motion.  ?   Cervical back: Normal range of motion and neck supple.  ?Lymphadenopathy:  ?   Cervical: No cervical adenopathy.  ?Skin: ?   General: Skin is warm and dry.  ?   Capillary Refill: Capillary refill takes less than 2 seconds.  ?Neurological:  ?   General: No focal deficit present.  ?   Mental Status: He is alert and oriented to person, place, and time.  ?Psychiatric:     ?   Mood and Affect: Mood normal.     ?   Behavior: Behavior normal.     ?   Thought Content: Thought content normal.     ?   Judgment: Judgment normal.  ? ? ?Assessment & Plan:  ?1. Chronic pancreatitis, unspecified pancreatitis type ([place]) ?The patient was hospitalized from 05/13/2021 through 05/16/2021 due to acute pancreatitis. Continues to have some intermittent abdominal pain. Was recommended he see GI for further evaluation. A referral was placed today. The patient was given off through Wednesday of next week. A work note was given to him. Will see him back for follow up in one week and determine his readiness to return to work.  ?- Ambulatory referral to Gastroenterology ? ?2. Hospital discharge follow-up ?Patient recently hospitalized from 05/13/2021  through 05/16/2021 due to acute pancreatitis. Reviewed progress notes, labs, and course of stay with the patient. Will repeat blood work in one week.  ? ?3. Iron deficiency anemia due to chronic blood loss ?Mild iron deficiency anemia noted while in the hospital. Taking iron supplement but making him have abdominal pain. Encouraged him to take every other day. Refer to GI for EGD and colonoscopy as recommended on hospital discharge. Will repeat labs next week.  ?- Ambulatory referral to Gastroenterology ? ?4. Gastroesophageal reflux disease without esophagitis ?Refer to GI for EGD and colonoscopy which was recommended at hospital discharge. ?- Ambulatory referral to Gastroenterology ? ?5. Essential hypertension ?  Stable. Continue atenolol as prescribed  ? ?6. Body mass index 29.0-29.9, adult ?Encourage patient to limit calorie intake to 2000 cal/day or less.  He should consume a low cholesterol, low-fat diet.  ? ?7. Encounter to establish care ?Appointment today to establish new primary care provider   ?  ?Problem List Items Addressed This Visit   ? ?  ? Cardiovascular and Mediastinum  ? Essential hypertension  ?  ? Digestive  ? Gastroesophageal reflux disease without esophagitis  ? Relevant Orders  ? Ambulatory referral to Gastroenterology  ? Pancreatitis - Primary  ? Relevant Orders  ? Ambulatory referral to Gastroenterology  ?  ? Other  ? Hospital discharge follow-up  ? Iron deficiency anemia due to chronic blood loss  ? Relevant Orders  ? Ambulatory referral to Gastroenterology  ? Body mass index 29.0-29.9, adult  ? ?Other Visit Diagnoses   ? ? Encounter to establish care      ? ?  ? ? ?Outpatient Encounter Medications as of 05/19/2021  ?Medication Sig  ? amLODipine (NORVASC) 10 MG tablet TAKE 1 TABLET BY MOUTH EVERY DAY  ? atenolol (TENORMIN) 100 MG tablet TAKE 1 TABLET BY MOUTH EVERY DAY  ? colchicine 0.6 MG tablet Take 1 capsule by mouth daily as needed (gout pain)  ? fenofibrate (TRICOR) 48 MG tablet TAKE 1  TABLET BY MOUTH EVERY DAY  ? ferrous gluconate (FERGON) 324 MG tablet Take 1 tablet (324 mg total) by mouth daily with breakfast.  ? folic acid (FOLVITE) 1 MG tablet Take 1 tablet (1 mg total) by mouth daily.  ? hydro

## 2021-05-20 DIAGNOSIS — Z09 Encounter for follow-up examination after completed treatment for conditions other than malignant neoplasm: Secondary | ICD-10-CM | POA: Insufficient documentation

## 2021-05-20 DIAGNOSIS — Z6829 Body mass index (BMI) 29.0-29.9, adult: Secondary | ICD-10-CM | POA: Insufficient documentation

## 2021-05-20 DIAGNOSIS — D500 Iron deficiency anemia secondary to blood loss (chronic): Secondary | ICD-10-CM | POA: Insufficient documentation

## 2021-05-21 ENCOUNTER — Encounter: Payer: Self-pay | Admitting: Gastroenterology

## 2021-05-26 ENCOUNTER — Other Ambulatory Visit: Payer: Self-pay

## 2021-05-26 ENCOUNTER — Encounter: Payer: Self-pay | Admitting: Nurse Practitioner

## 2021-05-26 ENCOUNTER — Ambulatory Visit (INDEPENDENT_AMBULATORY_CARE_PROVIDER_SITE_OTHER): Payer: BC Managed Care – PPO | Admitting: Nurse Practitioner

## 2021-05-26 VITALS — BP 134/84 | HR 70 | Temp 98.3°F | Ht 72.84 in | Wt 222.0 lb

## 2021-05-26 DIAGNOSIS — K5900 Constipation, unspecified: Secondary | ICD-10-CM

## 2021-05-26 DIAGNOSIS — K859 Acute pancreatitis without necrosis or infection, unspecified: Secondary | ICD-10-CM

## 2021-05-26 DIAGNOSIS — F1010 Alcohol abuse, uncomplicated: Secondary | ICD-10-CM

## 2021-05-26 DIAGNOSIS — M170 Bilateral primary osteoarthritis of knee: Secondary | ICD-10-CM

## 2021-05-26 DIAGNOSIS — Z6829 Body mass index (BMI) 29.0-29.9, adult: Secondary | ICD-10-CM

## 2021-05-26 DIAGNOSIS — I10 Essential (primary) hypertension: Secondary | ICD-10-CM

## 2021-05-26 MED ORDER — POLYETHYLENE GLYCOL 3350 17 G PO PACK: 17.0000 g | PACK | Freq: Every day | ORAL | 3 refills | Status: DC | PRN

## 2021-05-26 NOTE — Progress Notes (Signed)
Established patient visit ? ? ?Patient: Evan Sullivan   DOB: [birth year]   51 y.o. Male  MRN: [id number] ?Visit Date: 05/26/2021 ? ? ?Chief Complaint  ?Patient presents with  ? Pancreatitis  ? ?Subjective  ?  ?HPI  ?Chronic pancreatitis.  ?-recently hospitalized from 05/13/2021 through 05/16/2021.  ?-has been out of work since hospitalization. Very fatigued. Appetite decreased. Continued to have abdominal pain.  ?-check labs today. Should be ok to return to work on Wednesday.   ?-FMLA paperwork received to cover him for time missed from work due to his acute pancreatitis.  ?-FMLA needs to be updated for both knees. Had surgery on both knees to reconstruct the patellar tendons. Sometimes the knees swell and cause severe pain. He does have to miss work due to pain in his knees.  ?-if needed, he will miss 2 to 3 days of work every three months. Does not remember the last time he had to use this. Was told by his HR department it needed to be updated.  ?-today, he states that he feels like he is doing better. He is ready to return to work. Check of labs today.  ?-sates that he has had no alcohol consumption since his hospitalization. Even then, was drinking 3 to 4 nights per week.  ? ? ? ?Medications: ?Outpatient Medications Prior to Visit  ?Medication Sig  ? amLODipine (NORVASC) 10 MG tablet TAKE 1 TABLET BY MOUTH EVERY DAY  ? atenolol (TENORMIN) 100 MG tablet TAKE 1 TABLET BY MOUTH EVERY DAY  ? colchicine 0.6 MG tablet Take 1 capsule by mouth daily as needed (gout pain)  ? fenofibrate (TRICOR) 48 MG tablet TAKE 1 TABLET BY MOUTH EVERY DAY  ? ferrous gluconate (FERGON) 324 MG tablet Take 1 tablet (324 mg total) by mouth daily with breakfast.  ? hydrocortisone cream 1 % Apply 1 application topically daily as needed (rash).  ? ondansetron (ZOFRAN) 4 MG tablet Take 1 tablet (4 mg total) by mouth every 8 (eight) hours as needed for nausea or vomiting.  ? pantoprazole (PROTONIX) 40 MG tablet Take 1 tablet (40 mg total) by mouth  daily as needed (acid reflux).  ? [DISCONTINUED] folic acid (FOLVITE) 1 MG tablet Take 1 tablet (1 mg total) by mouth daily.  ? [DISCONTINUED] polyethylene glycol (MIRALAX / GLYCOLAX) 17 g packet Take 17 g by mouth daily as needed for moderate constipation.  ? [DISCONTINUED] thiamine 100 MG tablet Take 1 tablet (100 mg total) by mouth daily.  ? ?No facility-administered medications prior to visit.  ? ? ?Review of Systems  ?Constitutional:  Negative for activity change, chills, fatigue and fever.  ?HENT:  Negative for congestion, postnasal drip, rhinorrhea, sinus pressure, sinus pain, sneezing and sore throat.   ?Eyes: Negative.   ?Respiratory:  Negative for cough, shortness of breath and wheezing.   ?Cardiovascular:  Negative for chest pain and palpitations.  ?Gastrointestinal:  Negative for constipation, diarrhea, nausea and vomiting.  ?     Recovering from acute pancreatitis. States that he has not had any alcoholic drinks since being hospitalized. Beforet hat, he was drinking 2 to 3 nights of the week.   ?Endocrine: Negative for cold intolerance, heat intolerance, polydipsia and polyuria.  ?Genitourinary:  Negative for dysuria, frequency and urgency.  ?Musculoskeletal:  Positive for arthralgias and myalgias. Negative for back pain.  ?     Bilateral knee pain which acts up from time to time. Does miss work when this happens.   ?Skin:  Negative for  rash.  ?Allergic/Immunologic: Negative for environmental allergies.  ?Neurological:  Negative for dizziness, weakness and headaches.  ?Psychiatric/Behavioral:  The patient is not nervous/anxious.   ? ? ? Objective  ?  ? ?Today's Vitals  ? 05/26/21 1319  ?BP: 134/84  ?Pulse: 70  ?Temp: 98.3 ?F (36.8 ?C)  ?SpO2: 100%  ?Weight: 222 lb (100.7 kg)  ?Height: 6' 0.84" (1.85 m)  ? ?Body mass index is 29.42 kg/m?.  ? ?BP Readings from Last 3 Encounters:  ?05/26/21 134/84  ?05/19/21 128/71  ?05/16/21 (!) 147/99  ?  ?Wt Readings from Last 3 Encounters:  ?05/26/21 222 lb (100.7 kg)   ?05/19/21 223 lb 3.2 oz (101.2 kg)  ?05/13/21 214 lb 3.2 oz (97.2 kg)  ?  ?Physical Exam ?Vitals and nursing note reviewed.  ?Constitutional:   ?   Appearance: Normal appearance. He is well-developed.  ?HENT:  ?   Head: Normocephalic and atraumatic.  ?   Nose: Nose normal.  ?   Mouth/Throat:  ?   Mouth: Mucous membranes are moist.  ?   Pharynx: Oropharynx is clear.  ?Eyes:  ?   Extraocular Movements: Extraocular movements intact.  ?   Conjunctiva/sclera: Conjunctivae normal.  ?   Pupils: Pupils are equal, round, and reactive to light.  ?Cardiovascular:  ?   Rate and Rhythm: Normal rate and regular rhythm.  ?   Pulses: Normal pulses.  ?   Heart sounds: Normal heart sounds.  ?Pulmonary:  ?   Effort: Pulmonary effort is normal.  ?   Breath sounds: Normal breath sounds.  ?Abdominal:  ?   General: Bowel sounds are normal. There is no distension.  ?   Palpations: Abdomen is soft. There is no mass.  ?   Tenderness: There is no abdominal tenderness. There is no right CVA tenderness, left CVA tenderness, guarding or rebound.  ?   Hernia: No hernia is present.  ?Musculoskeletal:     ?   General: Normal range of motion.  ?   Cervical back: Normal range of motion and neck supple.  ?Lymphadenopathy:  ?   Cervical: No cervical adenopathy.  ?Skin: ?   General: Skin is warm and dry.  ?   Capillary Refill: Capillary refill takes less than 2 seconds.  ?Neurological:  ?   General: No focal deficit present.  ?   Mental Status: He is alert and oriented to person, place, and time.  ?Psychiatric:     ?   Mood and Affect: Mood normal.     ?   Behavior: Behavior normal.     ?   Thought Content: Thought content normal.     ?   Judgment: Judgment normal.  ?  ? ? Assessment & Plan  ?  ?1. Acute pancreatitis without infection or necrosis, unspecified pancreatitis type ?Patient out of work since 05/13/2021 due to acute on chronic pancreatitis. He states that he is feeling better and ready to go back to work. Will check CMP, CBC, and lipase. If  all results good, will release to go back to work 05/28/2021. FMLA paperwork to be completed and returned to patient as soon as possible.  ?- Comp Met (CMET) ?- CBC with Differential/Platelet ?- Lipase ? ?2. Acute constipation ?Patient should take Miralax as needed for acute constipation. Ensure plenty of water and fiber in the diet  ?- polyethylene glycol (MIRALAX / GLYCOLAX) 17 g packet; Take 17 g by mouth daily as needed for moderate constipation.  Dispense: 14 each; Refill: 3 ? ?  3. Essential hypertension ?Stable. Continue atenolol as prescribed. Hold HCTZ.  ? ?4. Primary osteoarthritis of both knees ?Patient with history of bilateral patellar tendons in the past. Intermittently cause pain and swelling. Will get updated FMLA paperwork to fill out and return to patient as soon as possible. He states that if needed, he will generally take two days every three months.  ? ?5. Body mass index 29.0-29.9, adult ?Encourage patient to limit calorie intake to 2000 cal/day or less.  He should consume a low cholesterol, low-fat diet.  Recommend he incorporate exercise into his daily routine.  ? ?6. Alcohol abuse ?This has not been a problem in some time. Has not had alcoholic beverage since his hospitalization 05/13/2021. Patient was drinking three days per week prior to this hospitalization. Will d/c thiamine and folic acid for now. Reassess at next visit in three months.  ? ?Problem List Items Addressed This Visit   ? ?  ? Cardiovascular and Mediastinum  ? Essential hypertension  ?  ? Digestive  ? Acute pancreatitis - Primary  ?  ? Musculoskeletal and Integument  ? Primary osteoarthritis of both knees  ?  ? Other  ? Alcohol abuse  ? Body mass index 29.0-29.9, adult  ? Acute constipation  ? Relevant Medications  ? polyethylene glycol (MIRALAX / GLYCOLAX) 17 g packet  ?  ? ?Return in about 3 months (around 08/25/2021) for diabetes with HgbA1c check.  ?   ? ? ? ? ?[name], NP  ?[facility] ?[phone] (phone) ?[phone] (fax) ? ?[facility]  ?

## 2021-05-27 ENCOUNTER — Other Ambulatory Visit: Payer: Self-pay | Admitting: Nurse Practitioner

## 2021-05-27 DIAGNOSIS — D500 Iron deficiency anemia secondary to blood loss (chronic): Secondary | ICD-10-CM

## 2021-05-27 DIAGNOSIS — D75839 Thrombocytosis, unspecified: Secondary | ICD-10-CM

## 2021-05-27 LAB — CBC WITH DIFFERENTIAL/PLATELET
Basophils Absolute: 0.1 10*3/uL (ref 0.0–0.2)
Basos: 1 %
EOS (ABSOLUTE): 0.5 10*3/uL — ABNORMAL HIGH (ref 0.0–0.4)
Eos: 6 %
Hematocrit: 31.9 % — ABNORMAL LOW (ref 37.5–51.0)
Hemoglobin: 10.7 g/dL — ABNORMAL LOW (ref 13.0–17.7)
Immature Grans (Abs): 0.1 10*3/uL (ref 0.0–0.1)
Immature Granulocytes: 1 %
Lymphocytes Absolute: 2.5 10*3/uL (ref 0.7–3.1)
Lymphs: 29 %
MCH: 27.9 pg (ref 26.6–33.0)
MCHC: 33.5 g/dL (ref 31.5–35.7)
MCV: 83 fL (ref 79–97)
Monocytes Absolute: 0.5 10*3/uL (ref 0.1–0.9)
Monocytes: 6 %
Neutrophils Absolute: 5 10*3/uL (ref 1.4–7.0)
Neutrophils: 57 %
Platelets: 839 10*3/uL (ref 150–450)
RBC: 3.84 x10E6/uL — ABNORMAL LOW (ref 4.14–5.80)
RDW: 14.6 % (ref 11.6–15.4)
WBC: 8.8 10*3/uL (ref 3.4–10.8)

## 2021-05-27 LAB — COMPREHENSIVE METABOLIC PANEL
ALT: 17 IU/L (ref 0–44)
AST: 14 IU/L (ref 0–40)
Albumin/Globulin Ratio: 1.3 (ref 1.2–2.2)
Albumin: 4.2 g/dL (ref 4.0–5.0)
Alkaline Phosphatase: 73 IU/L (ref 44–121)
BUN/Creatinine Ratio: 10 (ref 9–20)
BUN: 8 mg/dL (ref 6–24)
Bilirubin Total: <0.2 mg/dL (ref 0.0–1.2)
CO2: 21 mmol/L (ref 20–29)
Calcium: 10.3 mg/dL — ABNORMAL HIGH (ref 8.7–10.2)
Chloride: 103 mmol/L (ref 96–106)
Creatinine, Ser: 0.84 mg/dL (ref 0.76–1.27)
Globulin, Total: 3.2 g/dL (ref 1.5–4.5)
Glucose: 86 mg/dL (ref 70–99)
Potassium: 4.5 mmol/L (ref 3.5–5.2)
Sodium: 138 mmol/L (ref 134–144)
Total Protein: 7.4 g/dL (ref 6.0–8.5)
eGFR: 107 mL/min/{1.73_m2} (ref >59)

## 2021-05-27 LAB — LIPASE: Lipase: 122 U/L — ABNORMAL HIGH (ref 13–78)

## 2021-05-27 NOTE — Progress Notes (Signed)
Urgent referral made to hematology as platelet count is extremely high and iron deficiency is worsening.   ?

## 2021-05-27 NOTE — Progress Notes (Signed)
Please let the patient know that I have made Urgent referral to hematology as platelet count is extremely high and iron deficiency is worsening.  He does need to continue with the ferrous sulfate supplement every other day until he sees hematology. It is still ok for him to return to work tomorrow. I would like for him to come in to have CBC rechecked next week to see if this is improving. Also, lipase is starting to rise again, so it is very important for him to see the GI on 5/15 as scheduled. When we do his labs next week, we should also check CMP. He will need to schedule just lab appointment. I will make sure the FMLA paperwork includes the new findings. Thanks so much.   -HB

## 2021-05-30 ENCOUNTER — Encounter: Payer: Self-pay | Admitting: Oncology

## 2021-05-30 ENCOUNTER — Inpatient Hospital Stay: Payer: BC Managed Care – PPO | Attending: Oncology | Admitting: Oncology

## 2021-05-30 ENCOUNTER — Inpatient Hospital Stay: Payer: BC Managed Care – PPO

## 2021-05-30 ENCOUNTER — Other Ambulatory Visit: Payer: Self-pay

## 2021-05-30 VITALS — BP 136/98 | HR 68 | Temp 98.8°F | Resp 16 | Ht 73.0 in | Wt 219.3 lb

## 2021-05-30 DIAGNOSIS — K219 Gastro-esophageal reflux disease without esophagitis: Secondary | ICD-10-CM | POA: Insufficient documentation

## 2021-05-30 DIAGNOSIS — R109 Unspecified abdominal pain: Secondary | ICD-10-CM | POA: Diagnosis not present

## 2021-05-30 DIAGNOSIS — Z79899 Other long term (current) drug therapy: Secondary | ICD-10-CM | POA: Insufficient documentation

## 2021-05-30 DIAGNOSIS — R739 Hyperglycemia, unspecified: Secondary | ICD-10-CM | POA: Diagnosis not present

## 2021-05-30 DIAGNOSIS — F1210 Cannabis abuse, uncomplicated: Secondary | ICD-10-CM

## 2021-05-30 DIAGNOSIS — E785 Hyperlipidemia, unspecified: Secondary | ICD-10-CM | POA: Diagnosis not present

## 2021-05-30 DIAGNOSIS — D509 Iron deficiency anemia, unspecified: Secondary | ICD-10-CM

## 2021-05-30 DIAGNOSIS — Z8711 Personal history of peptic ulcer disease: Secondary | ICD-10-CM | POA: Diagnosis not present

## 2021-05-30 DIAGNOSIS — I10 Essential (primary) hypertension: Secondary | ICD-10-CM

## 2021-05-30 DIAGNOSIS — Z800 Family history of malignant neoplasm of digestive organs: Secondary | ICD-10-CM | POA: Diagnosis not present

## 2021-05-30 DIAGNOSIS — K859 Acute pancreatitis without necrosis or infection, unspecified: Secondary | ICD-10-CM | POA: Diagnosis not present

## 2021-05-30 DIAGNOSIS — Z833 Family history of diabetes mellitus: Secondary | ICD-10-CM | POA: Diagnosis not present

## 2021-05-30 DIAGNOSIS — Z8249 Family history of ischemic heart disease and other diseases of the circulatory system: Secondary | ICD-10-CM

## 2021-05-30 DIAGNOSIS — M170 Bilateral primary osteoarthritis of knee: Secondary | ICD-10-CM | POA: Insufficient documentation

## 2021-05-30 DIAGNOSIS — E876 Hypokalemia: Secondary | ICD-10-CM | POA: Diagnosis not present

## 2021-05-30 DIAGNOSIS — F1010 Alcohol abuse, uncomplicated: Secondary | ICD-10-CM | POA: Diagnosis not present

## 2021-05-30 DIAGNOSIS — R5383 Other fatigue: Secondary | ICD-10-CM | POA: Diagnosis not present

## 2021-05-30 DIAGNOSIS — Z8719 Personal history of other diseases of the digestive system: Secondary | ICD-10-CM

## 2021-05-30 DIAGNOSIS — K5909 Other constipation: Secondary | ICD-10-CM

## 2021-05-30 DIAGNOSIS — D75839 Thrombocytosis, unspecified: Secondary | ICD-10-CM | POA: Diagnosis not present

## 2021-05-30 NOTE — Progress Notes (Signed)
Pt having stomach pain from iron pills, he spoke to md and was told to take it on full stomach and change it to every other day. Was told to get miralax for the constipation ?

## 2021-05-30 NOTE — Progress Notes (Signed)
? ?Hematology/Oncology Consult note ?[place] ?[phone] [phone] ? ?Patient Care Team: ?[name], NP as PCP - General (Family Medicine)  ? ?Name of the patient: Evan Sullivan  ?[id number]  ?05-17-1970  ? ? ?Reason for referral-iron deficiency anemia ?  ?Referring physician-Heather Junius Creamer NP ? ?Date of visit: 05/30/21 ? ? ?History of presenting illness- Patient is a 51 year old African-American male currently referred for iron deficiency anemia.  His most recent CBC from 05/26/2021 showed white cell count of 8.8, H&H of 10.7/31.9 with an MCV of 83 and a platelet count of 839.  Looking back at his prior CBCs his hemoglobin at baseline runs between 14-15 and his platelet counts historically have been normal.  Iron studies showed low iron saturation of 2% and elevated TIBC of 572.  Ferritin levels were not checked at that time.  Patient has had a colonoscopy with Dr. Marius Ditch in October 2021 which showed a 15 mm polyp in the ascending colon and 2 small polyps in the rectum.  Repeat colonoscopy was recommended in 3 years and biopsies were negative for dysplasia and malignancy. ? ?Patient reports taking oral iron every other day for the last 1 week or so.  Initially he was concerned that the oral iron was bothering him but states that now he is tolerating it better with food.  Denies any blood loss in his stool or urine.  Denies any dark melanotic stools.  Denies any consistent use of NSAIDs.  Family history of colon cancer in his father when he was 27. ? ?ECOG PS- 1 ? ?Pain scale- 0 ? ? ?Review of systems- Review of Systems  ?Constitutional:  Positive for malaise/fatigue. Negative for chills, fever and weight loss.  ?HENT:  Negative for congestion, ear discharge and nosebleeds.   ?Eyes:  Negative for blurred vision.  ?Respiratory:  Negative for cough, hemoptysis, sputum production, shortness of breath and wheezing.   ?Cardiovascular:  Negative for chest pain,  palpitations, orthopnea and claudication.  ?Gastrointestinal:  Positive for abdominal pain. Negative for blood in stool, constipation, diarrhea, heartburn, melena, nausea and vomiting.  ?Genitourinary:  Negative for dysuria, flank pain, frequency, hematuria and urgency.  ?Musculoskeletal:  Negative for back pain, joint pain and myalgias.  ?Skin:  Negative for rash.  ?Neurological:  Negative for dizziness, tingling, focal weakness, seizures, weakness and headaches.  ?Endo/Heme/Allergies:  Does not bruise/bleed easily.  ?Psychiatric/Behavioral:  Negative for depression and suicidal ideas. The patient does not have insomnia.   ? ?No Known Allergies ? ?Patient Active Problem List  ? Diagnosis Date Noted  ? Acute constipation 05/26/2021  ? Primary osteoarthritis of both knees 05/26/2021  ? Hospital discharge follow-up 05/20/2021  ? Iron deficiency anemia due to chronic blood loss 05/20/2021  ? Body mass index 29.0-29.9, adult 05/20/2021  ? Acute pancreatitis 05/14/2021  ? Pancreatitis 05/13/2021  ? Marijuana abuse 05/13/2021  ? Alcohol abuse 05/13/2021  ? Hypokalemia 05/13/2021  ? History of gout 05/13/2021  ? HLD (hyperlipidemia) 05/13/2021  ? Hyperglycemia 05/13/2021  ? Encounter for screening colonoscopy 12/31/2018  ? Family history of colon cancer in father 12/31/2018  ? Dysuria 12/31/2018  ? Gastroesophageal reflux disease without esophagitis 09/15/2017  ? 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 Avascular necrosis of bone of hip, right ([place]) 03/15/2017  ? Essential hypertension 03/15/2017  ? History of peptic ulcer disease 03/15/2017  ? Normocytic anemia 11/01/2015  ? SOB (shortness of breath) 11/01/2015  ? Syncope 11/01/2015  ? ? ? ?Past Medical  History:  ?Diagnosis Date  ? Arthritis   ? Avascular necrosis of femoral head, right ([place]) 2019  ? Diabetes mellitus without complication ([place])   ? GERD (gastroesophageal reflux disease)   ? Gout   ? Hypertension   ? Peptic ulcer disease   ? ? ? ?Past Surgical  History:  ?Procedure Laterality Date  ? COLONOSCOPY    ? COLONOSCOPY WITH PROPOFOL N/A 11/30/2019  ? Procedure: COLONOSCOPY WITH PROPOFOL;  Surgeon: Lin Landsman, MD;  Location: [place];  Service: Endoscopy;  Laterality: N/A;  priority 4  ? KNEE ARTHROSCOPY, MEDIAL PATELLO FEMORAL LIGAMENT REPAIR Bilateral 2004,2000  ? POLYPECTOMY  11/30/2019  ? Procedure: POLYPECTOMY;  Surgeon: Lin Landsman, MD;  Location: [place];  Service: Endoscopy;;  ? TOTAL HIP ARTHROPLASTY Right 08/02/2017  ? Procedure: TOTAL HIP ARTHROPLASTY;  Surgeon: Dereck Leep, MD;  Location: [facility];  Service: Orthopedics;  Laterality: Right;  ? ? ?Social History  ? ?Socioeconomic History  ? Marital status: Single  ?  Spouse name: Not on file  ? Number of children: Not on file  ? Years of education: Not on file  ? Highest education level: Not on file  ?Occupational History  ? Not on file  ?Tobacco Use  ? Smoking status: Never  ? Smokeless tobacco: Never  ?Vaping Use  ? Vaping Use: Never used  ?Substance and Sexual Activity  ? Alcohol use: Yes  ?  Alcohol/week: 14.0 standard drinks  ?  Types: 14 Cans of beer per week  ?  Comment: occ  ? Drug use: Yes  ?  Types: Marijuana  ?  Comment: daily   ? Sexual activity: Yes  ?  Birth control/protection: Condom  ?Other Topics Concern  ? Not on file  ?Social History Narrative  ? Not on file  ? ?Social Determinants of Health  ? ?Financial Resource Strain: Not on file  ?Food Insecurity: Not on file  ?Transportation Needs: Not on file  ?Physical Activity: Not on file  ?Stress: Not on file  ?Social Connections: Not on file  ?Intimate Partner Violence: Not on file  ? ?  ?Family History  ?Problem Relation Age of Onset  ? Hypertension Mother   ? Hypertension Father   ? Colon cancer Father   ? Diabetes Father   ? ? ? ?Current Outpatient Medications:  ?  amLODipine (NORVASC) 10 MG tablet, TAKE 1 TABLET BY MOUTH EVERY DAY, Disp: 30 tablet, Rfl: 3 ?  atenolol (TENORMIN) 100 MG tablet,  TAKE 1 TABLET BY MOUTH EVERY DAY, Disp: 90 tablet, Rfl: 0 ?  colchicine 0.6 MG tablet, Take 1 capsule by mouth daily as needed (gout pain), Disp: 30 tablet, Rfl: 1 ?  fenofibrate (TRICOR) 48 MG tablet, TAKE 1 TABLET BY MOUTH EVERY DAY, Disp: 90 tablet, Rfl: 1 ?  ferrous gluconate (FERGON) 324 MG tablet, Take 1 tablet (324 mg total) by mouth daily with breakfast., Disp: 30 tablet, Rfl: 3 ?  hydrocortisone cream 1 %, Apply 1 application topically daily as needed (rash)., Disp: , Rfl:  ?  ondansetron (ZOFRAN) 4 MG tablet, Take 1 tablet (4 mg total) by mouth every 8 (eight) hours as needed for nausea or vomiting., Disp: 4 tablet, Rfl: 0 ?  pantoprazole (PROTONIX) 40 MG tablet, Take 1 tablet (40 mg total) by mouth daily as needed (acid reflux)., Disp: 30 tablet, Rfl: 5 ?  polyethylene glycol (MIRALAX / GLYCOLAX) 17 g packet, Take 17 g by mouth daily as needed for moderate  constipation., Disp: 14 each, Rfl: 3 ? ? ?Physical exam:  ?Vitals:  ? 05/30/21 1406 05/30/21 1420  ?BP: (!) 136/53 (!) 136/98  ?Pulse:  68  ?Resp:  16  ?Temp:  98.8 ?F (37.1 ?C)  ?TempSrc:  Oral  ?Weight: 219 lb 4.8 oz (99.5 kg)   ?Height: '6\' 1"'$  (1.854 m)   ? ?Physical Exam ?Constitutional:   ?   General: He is not in acute distress. ?Cardiovascular:  ?   Rate and Rhythm: Normal rate and regular rhythm.  ?   Heart sounds: Normal heart sounds.  ?Pulmonary:  ?   Effort: Pulmonary effort is normal.  ?   Breath sounds: Normal breath sounds.  ?Abdominal:  ?   General: Bowel sounds are normal.  ?   Palpations: Abdomen is soft.  ?Skin: ?   General: Skin is warm and dry.  ?Neurological:  ?   Mental Status: He is alert and oriented to person, place, and time.  ?  ? ? ? ? ?  Latest Ref Rng & Units 05/26/2021  ?  1:22 PM  ?CMP  ?Glucose 70 - 99 mg/dL 86    ?BUN 6 - 24 mg/dL 8    ?Creatinine 0.76 - 1.27 mg/dL 0.84    ?Sodium 134 - 144 mmol/L 138    ?Potassium 3.5 - 5.2 mmol/L 4.5    ?Chloride 96 - 106 mmol/L 103    ?CO2 20 - 29 mmol/L 21    ?Calcium 8.7 - 10.2 mg/dL  10.3    ?Total Protein 6.0 - 8.5 g/dL 7.4    ?Total Bilirubin 0.0 - 1.2 mg/dL <0.2    ?Alkaline Phos 44 - 121 IU/L 73    ?AST 0 - 40 IU/L 14    ?ALT 0 - 44 IU/L 17    ? ? ?  Latest Ref Rng & Units 05/26/2021  ?  1:2

## 2021-06-01 LAB — H. PYLORI ANTIGEN, STOOL: H. Pylori Stool Ag, Eia: NEGATIVE

## 2021-06-04 ENCOUNTER — Other Ambulatory Visit: Payer: BC Managed Care – PPO

## 2021-06-04 DIAGNOSIS — D500 Iron deficiency anemia secondary to blood loss (chronic): Secondary | ICD-10-CM | POA: Diagnosis not present

## 2021-06-04 DIAGNOSIS — Z0000 Encounter for general adult medical examination without abnormal findings: Secondary | ICD-10-CM

## 2021-06-04 DIAGNOSIS — R748 Abnormal levels of other serum enzymes: Secondary | ICD-10-CM

## 2021-06-04 DIAGNOSIS — K859 Acute pancreatitis without necrosis or infection, unspecified: Secondary | ICD-10-CM | POA: Diagnosis not present

## 2021-06-04 NOTE — Addendum Note (Signed)
Addended by: Vivia Birmingham on: 06/04/2021 02:15 PM ? ? Modules accepted: Orders ? ?

## 2021-06-04 NOTE — Addendum Note (Signed)
Addended by: Vivia Birmingham on: 06/04/2021 02:17 PM ? ? Modules accepted: Orders ? ?

## 2021-06-05 LAB — COMPREHENSIVE METABOLIC PANEL
ALT: 12 IU/L (ref 0–44)
AST: 16 IU/L (ref 0–40)
Albumin/Globulin Ratio: 1.6 (ref 1.2–2.2)
Albumin: 4.5 g/dL (ref 4.0–5.0)
Alkaline Phosphatase: 62 IU/L (ref 44–121)
BUN/Creatinine Ratio: 14 (ref 9–20)
BUN: 13 mg/dL (ref 6–24)
Bilirubin Total: <0.2 mg/dL (ref 0.0–1.2)
CO2: 21 mmol/L (ref 20–29)
Calcium: 10.5 mg/dL — ABNORMAL HIGH (ref 8.7–10.2)
Chloride: 106 mmol/L (ref 96–106)
Creatinine, Ser: 0.95 mg/dL (ref 0.76–1.27)
Globulin, Total: 2.9 g/dL (ref 1.5–4.5)
Glucose: 102 mg/dL — ABNORMAL HIGH (ref 70–99)
Potassium: 4.3 mmol/L (ref 3.5–5.2)
Sodium: 141 mmol/L (ref 134–144)
Total Protein: 7.4 g/dL (ref 6.0–8.5)
eGFR: 98 mL/min/{1.73_m2} (ref >59)

## 2021-06-05 LAB — CBC WITH DIFFERENTIAL/PLATELET
Basophils Absolute: 0.1 10*3/uL (ref 0.0–0.2)
Basos: 1 %
EOS (ABSOLUTE): 0.3 10*3/uL (ref 0.0–0.4)
Eos: 6 %
Hematocrit: 34.4 % — ABNORMAL LOW (ref 37.5–51.0)
Hemoglobin: 11.3 g/dL — ABNORMAL LOW (ref 13.0–17.7)
Immature Grans (Abs): 0 10*3/uL (ref 0.0–0.1)
Immature Granulocytes: 0 %
Lymphocytes Absolute: 1.8 10*3/uL (ref 0.7–3.1)
Lymphs: 34 %
MCH: 27.7 pg (ref 26.6–33.0)
MCHC: 32.8 g/dL (ref 31.5–35.7)
MCV: 84 fL (ref 79–97)
Monocytes Absolute: 0.3 10*3/uL (ref 0.1–0.9)
Monocytes: 6 %
Neutrophils Absolute: 2.7 10*3/uL (ref 1.4–7.0)
Neutrophils: 53 %
Platelets: 620 10*3/uL — ABNORMAL HIGH (ref 150–450)
RBC: 4.08 x10E6/uL — ABNORMAL LOW (ref 4.14–5.80)
RDW: 15.3 % (ref 11.6–15.4)
WBC: 5.1 10*3/uL (ref 3.4–10.8)

## 2021-06-05 LAB — LIPASE: Lipase: 94 U/L — ABNORMAL HIGH (ref 13–78)

## 2021-06-05 NOTE — Progress Notes (Signed)
Please let the patient know that labs are back. Anemia is slightly improved. He should continue the iron supplement every other day. Platelet count is slowly improving and so is lipase. He should continue to follow up with hematology and with GI as scheduled.  ?His calcium level is still elevated. When he returns to this office, we will recheck again.  ?Thanks so much.   -HB

## 2021-06-07 ENCOUNTER — Other Ambulatory Visit: Payer: Self-pay | Admitting: Physician Assistant

## 2021-06-07 DIAGNOSIS — I10 Essential (primary) hypertension: Secondary | ICD-10-CM

## 2021-06-07 DIAGNOSIS — E781 Pure hyperglyceridemia: Secondary | ICD-10-CM

## 2021-06-12 ENCOUNTER — Encounter: Payer: Self-pay | Admitting: Gastroenterology

## 2021-06-12 ENCOUNTER — Ambulatory Visit (INDEPENDENT_AMBULATORY_CARE_PROVIDER_SITE_OTHER): Payer: BC Managed Care – PPO | Admitting: Gastroenterology

## 2021-06-12 VITALS — BP 120/80 | HR 72 | Ht 72.0 in | Wt 219.6 lb

## 2021-06-12 DIAGNOSIS — D509 Iron deficiency anemia, unspecified: Secondary | ICD-10-CM | POA: Diagnosis not present

## 2021-06-12 DIAGNOSIS — K859 Acute pancreatitis without necrosis or infection, unspecified: Secondary | ICD-10-CM | POA: Diagnosis not present

## 2021-06-12 DIAGNOSIS — R935 Abnormal findings on diagnostic imaging of other abdominal regions, including retroperitoneum: Secondary | ICD-10-CM

## 2021-06-12 DIAGNOSIS — Z800 Family history of malignant neoplasm of digestive organs: Secondary | ICD-10-CM

## 2021-06-12 MED ORDER — SUTAB 1479-225-188 MG PO TABS: 1.0000 | ORAL_TABLET | Freq: Once | ORAL | 0 refills | Status: AC

## 2021-06-12 NOTE — Progress Notes (Signed)
? ?HPI : Evan Sullivan is a very pleasant 51 year old male with a history of hypertension, heavy alcohol use and alcohol-associated acute pancreatitis who is referred to [place] by Leretha Pol, NP for further evaluation of iron deficiency anemia.   ?He was admitted to [facility] from April 82-50NL with uncomplicated acute pancreatitis.  He had started having epigastric pain the day before his admission.  The pain was severe and was associated with anorexia and nausea, but no vomiting. ?A CT scan showed features of chronic (calcifications) and acute (stranding/edema) pancreatitis.  Lipase was 1,017.  The CT also showed a stenotic celiac trunk and enlargement of the pancreaticoduodenal and gastroduodenal arteries with collaterals from the SMA, suggestive of median arcuate ligament syndrome.  He was referred to surgery. ? ?His abdominal pain persisted a few days after his discharge, but now has completely dissipated.  He denies any GI symptoms currently, to include abdominal pain, constipation, diarrhea, blood in stool (hematochezia or melena), nausea/vomiting, heartburn/acid regurgitation or dysphagia.  His appetite is normal.  No early satiety. ? ?A month prior to this hospitalization, he was seen in the ED for what he describes as severe GERD symptoms described as nausea/vomiting and epigastric/chest pain.  Labs including lipase were unremarkable.  His symptoms improved rapidly with GI cocktail, anti-emetics and fluids. ?He denies recurrence of these symptoms.  He denies chronic GERD symptoms. ? ?Prior to his hospitalization he reports drinking 3-4 days per week, usually 6-7 beers/day, sometimes more.  Since his hospitalization, he has cut back significantly, but not completely stopped drinking alcohol. ? ?He was noted to have a hgb of 12 down from a baseline of 15 upon his admission.  Iron panel was consistent with iron deficiency.  He has been taking oral iron supplements and tolerating them well, without  any GI side effects. ? ?He had a colonoscopy in Oct 2021 in which a 15 mm polyp was removed from the ascending colon.  Pathology results showed it to be colonic mucosa with a submucosal lipoma and he was recommended to repeat in 5 years. ?His father was diagnosed with colon cancer in his 44s. ? ? ?Past Medical History:  ?Diagnosis Date  ? Anemia   ? Arthritis   ? Avascular necrosis of femoral head, right ([place]) 2019  ? GERD (gastroesophageal reflux disease)   ? Gout   ? Hypertension   ? Peptic ulcer disease   ? ? ? ?Past Surgical History:  ?Procedure Laterality Date  ? COLONOSCOPY    ? COLONOSCOPY WITH PROPOFOL N/A 11/30/2019  ? Procedure: COLONOSCOPY WITH PROPOFOL;  Surgeon: Lin Landsman, MD;  Location: [facility];  Service: Endoscopy;  Laterality: N/A;  priority 4  ? KNEE ARTHROSCOPY, MEDIAL PATELLO FEMORAL LIGAMENT REPAIR Bilateral 2004,2000  ? POLYPECTOMY  11/30/2019  ? Procedure: POLYPECTOMY;  Surgeon: Lin Landsman, MD;  Location: [facility];  Service: Endoscopy;;  ? TOTAL HIP ARTHROPLASTY Right 08/02/2017  ? Procedure: TOTAL HIP ARTHROPLASTY;  Surgeon: Dereck Leep, MD;  Location: [facility];  Service: Orthopedics;  Laterality: Right;  ? ?Family History  ?Problem Relation Age of Onset  ? Hypertension Mother   ? Hypertension Father   ? Colon cancer Father 26  ? Diabetes Father   ? Pancreatic cancer Neg Hx   ? Esophageal cancer Neg Hx   ? Liver cancer Neg Hx   ? Stomach cancer Neg Hx   ? ?Social History  ? ?Tobacco Use  ? Smoking status: Never  ? Smokeless  tobacco: Never  ?Vaping Use  ? Vaping Use: Never used  ?Substance Use Topics  ? Alcohol use: Not Currently  ?  Comment: not drinking alcohol for 3 weeks  ? Drug use: Yes  ?  Types: Marijuana  ?  Comment: daily   ? ?Current Outpatient Medications  ?Medication Sig Dispense Refill  ? amLODipine (NORVASC) 10 MG tablet TAKE 1 TABLET BY MOUTH EVERY DAY 30 tablet 3  ? atenolol (TENORMIN) 100 MG tablet TAKE 1 TABLET BY MOUTH EVERY DAY 90  tablet 0  ? colchicine 0.6 MG tablet Take 1 capsule by mouth daily as needed (gout pain) 30 tablet 1  ? fenofibrate (TRICOR) 48 MG tablet TAKE 1 TABLET BY MOUTH EVERY DAY 90 tablet 1  ? ferrous gluconate (FERGON) 324 MG tablet Take 1 tablet (324 mg total) by mouth daily with breakfast. (Patient taking differently: Take 324 mg by mouth every other day.) 30 tablet 3  ? hydrocortisone cream 1 % Apply 1 application topically daily as needed (rash).    ? ondansetron (ZOFRAN) 4 MG tablet Take 1 tablet (4 mg total) by mouth every 8 (eight) hours as needed for nausea or vomiting. 4 tablet 0  ? pantoprazole (PROTONIX) 40 MG tablet Take 1 tablet (40 mg total) by mouth daily as needed (acid reflux). 30 tablet 5  ? polyethylene glycol (MIRALAX / GLYCOLAX) 17 g packet Take 17 g by mouth daily as needed for moderate constipation. (Patient not taking: Reported on 06/12/2021) 14 each 3  ? ?No current facility-administered medications for this visit.  ? ?No Known Allergies ? ? ?Review of Systems: ?All systems reviewed and negative except where noted in HPI.  ? ? ?No results found. ? ?Physical Exam: ?BP 120/80   Pulse 72   Ht 6' (1.829 m)   Wt 219 lb 9.6 oz (99.6 kg)   BMI 29.78 kg/m?  ?Constitutional: Pleasant,well-developed, African American male in no acute distress. ?HEENT: Normocephalic and atraumatic. Conjunctivae are normal. No scleral icterus. ?Neck supple.  ?Cardiovascular: Normal rate, regular rhythm.  ?Pulmonary/chest: Effort normal and breath sounds normal. No wheezing, rales or rhonchi. ?Abdominal: Soft, nondistended, nontender. Bowel sounds active throughout. There are no masses palpable. No hepatomegaly. ?Extremities: no edema ?Neurological: Alert and oriented to person place and time. ?Skin: Skin is warm and dry. No rashes noted. ?Psychiatric: Normal mood and affect. Behavior is normal. ? ?CBC ?   ?Component Value Date/Time  ? WBC 5.1 06/04/2021 1417  ? WBC 8.4 05/14/2021 0519  ? RBC 4.08 (L) 06/04/2021 1417  ? RBC  4.18 (L) 05/14/2021 0519  ? HGB 11.3 (L) 06/04/2021 1417  ? HCT 34.4 (L) 06/04/2021 1417  ? PLT 620 (H) 06/04/2021 1417  ? MCV 84 06/04/2021 1417  ? MCV 92 09/12/2012 1623  ? MCH 27.7 06/04/2021 1417  ? MCH 28.7 05/14/2021 0519  ? MCHC 32.8 06/04/2021 1417  ? MCHC 32.3 05/14/2021 0519  ? RDW 15.3 06/04/2021 1417  ? RDW 13.4 09/12/2012 1623  ? LYMPHSABS 1.8 06/04/2021 1417  ? MONOABS 0.5 05/13/2021 0803  ? EOSABS 0.3 06/04/2021 1417  ? BASOSABS 0.1 06/04/2021 1417  ? ? ?CMP  ?   ?Component Value Date/Time  ? NA 141 06/04/2021 1417  ? NA 139 09/12/2012 1623  ? K 4.3 06/04/2021 1417  ? K 3.9 09/12/2012 1623  ? CL 106 06/04/2021 1417  ? CL 106 09/12/2012 1623  ? CO2 21 06/04/2021 1417  ? CO2 28 09/12/2012 1623  ? GLUCOSE 102 (H) 06/04/2021  1417  ? GLUCOSE 115 (H) 05/16/2021 0522  ? GLUCOSE 113 (H) 09/12/2012 1623  ? BUN 13 06/04/2021 1417  ? BUN 17 09/12/2012 1623  ? CREATININE 0.95 06/04/2021 1417  ? CREATININE 0.98 09/12/2012 1623  ? CALCIUM 10.5 (H) 06/04/2021 1417  ? CALCIUM 9.3 09/12/2012 1623  ? PROT 7.4 06/04/2021 1417  ? ALBUMIN 4.5 06/04/2021 1417  ? AST 16 06/04/2021 1417  ? ALT 12 06/04/2021 1417  ? ALKPHOS 62 06/04/2021 1417  ? BILITOT <0.2 06/04/2021 1417  ? GFRNONAA >60 05/16/2021 0522  ? GFRNONAA >60 09/12/2012 1623  ? GFRAA >60 06/22/2019 1704  ? GFRAA >60 09/12/2012 1623  ? ?Component Ref Range & Units 1 mo ago ?(05/14/21) 1 mo ago ?(05/13/21) 2 mo ago ?(04/12/21) 2 mo ago ?(03/24/21) 8 mo ago ?(10/09/20) 1 yr ago ?(01/31/20) 1 yr ago ?(06/22/19)  ?WBC 4.0 - 10.5 K/uL 8.4  8.4  9.0   5.7 R   11.9 High    ?RBC 4.22 - 5.81 MIL/uL 4.18 Low   4.20 Low   3.90 Low   None seen R  4.89 R  None seen R  4.68   ?Hemoglobin 13.0 - 17.0 g/dL 12.0 Low   12.1 Low   12.6 Low    15.6 R   15.1   ?HCT 39.0 - 52.0 % 37.2 Low   36.5 Low   36.1 Low    44.8 R   45.1   ?MCV 80.0 - 100.0 fL 89.0  86.9  92.6   92 R   96.4   ?MCH 26.0 - 34.0 pg 28.7  28.8  32.3   31.9 R   32.3   ?MCHC 30.0 - 36.0 g/dL 32.3  33.2  34.9   34.8 R   33.5    ?RDW 11.5 - 15.5 % 13.2  13.0  11.8   12.5 R   12.4   ?Platelets 150 - 400 K/uL 387  424 High   329   315 R     ? ?Component Ref Range & Units 1 mo ago  ?Iron 45 - 182 ug/dL 13 Low    ?TIBC 250 - 450 ug/dL

## 2021-06-12 NOTE — Patient Instructions (Addendum)
If you are age 50 or older, your body mass index should be between 23-30. Your Body mass index is 29.78 kg/m?Marland Kitchen If this is out of the aforementioned range listed, please consider follow up with your Primary Care Provider. ? ?If you are age 23 or younger, your body mass index should be between 19-25. Your Body mass index is 29.78 kg/m?Marland Kitchen If this is out of the aformentioned range listed, please consider follow up with your Primary Care Provider.  ? ?You will be contacted by [facility] in the next 2 days to arrange a Ultrasound.  The number on your caller ID will be [phone], please answer when they call.  If you have not heard from them in 2 days please call [phone] to schedule.   ? ?The [place] [facility] providers would like to encourage you to use [facility] to communicate with providers for non-urgent requests or questions.  Due to long hold times on the telephone, sending your provider a message by [facility] may be a faster and more efficient way to get a response.  Please allow 48 business hours for a response.  Please remember that this is for non-urgent requests.  ? ?It was a pleasure to see you today! ? ?Thank you for trusting me with your gastrointestinal care!   ?  ?Scott E.Candis Schatz, MD  ? ?

## 2021-06-26 ENCOUNTER — Ambulatory Visit: Payer: BC Managed Care – PPO | Admitting: Physician Assistant

## 2021-07-01 ENCOUNTER — Ambulatory Visit
Admission: RE | Admit: 2021-07-01 | Discharge: 2021-07-01 | Disposition: A | Discharge status: Home / Self Care | Payer: BC Managed Care – PPO | Source: Ambulatory Visit | Attending: Gastroenterology | Admitting: Gastroenterology

## 2021-07-01 DIAGNOSIS — D509 Iron deficiency anemia, unspecified: Secondary | ICD-10-CM | POA: Insufficient documentation

## 2021-07-01 DIAGNOSIS — K859 Acute pancreatitis without necrosis or infection, unspecified: Secondary | ICD-10-CM | POA: Insufficient documentation

## 2021-07-08 ENCOUNTER — Other Ambulatory Visit: Payer: Self-pay | Admitting: Internal Medicine

## 2021-07-08 DIAGNOSIS — I10 Essential (primary) hypertension: Secondary | ICD-10-CM

## 2021-07-09 ENCOUNTER — Other Ambulatory Visit: Payer: Self-pay | Admitting: Physician Assistant

## 2021-07-09 DIAGNOSIS — I10 Essential (primary) hypertension: Secondary | ICD-10-CM

## 2021-07-09 NOTE — Progress Notes (Signed)
Evan Sullivan,  Your ultrasound did not show any gallstones, which makes alcohol the more likely cause of the pancreatitis.  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  Your liver also appeared normal without any evidence of chronic damage from alcohol abuse.  This is good news. We will see you soon for your EGD and colonoscopy

## 2021-07-10 ENCOUNTER — Encounter: Payer: Self-pay | Admitting: Gastroenterology

## 2021-07-16 ENCOUNTER — Ambulatory Visit (AMBULATORY_SURGERY_CENTER): Payer: BC Managed Care – PPO | Admitting: Gastroenterology

## 2021-07-16 ENCOUNTER — Encounter: Payer: Self-pay | Admitting: Gastroenterology

## 2021-07-16 VITALS — BP 151/95 | HR 56 | Temp 98.0°F | Resp 12

## 2021-07-16 DIAGNOSIS — K2950 Unspecified chronic gastritis without bleeding: Secondary | ICD-10-CM | POA: Diagnosis not present

## 2021-07-16 DIAGNOSIS — K2980 Duodenitis without bleeding: Secondary | ICD-10-CM | POA: Diagnosis not present

## 2021-07-16 DIAGNOSIS — K640 First degree hemorrhoids: Secondary | ICD-10-CM | POA: Diagnosis not present

## 2021-07-16 DIAGNOSIS — D124 Benign neoplasm of descending colon: Secondary | ICD-10-CM

## 2021-07-16 DIAGNOSIS — D122 Benign neoplasm of ascending colon: Secondary | ICD-10-CM

## 2021-07-16 DIAGNOSIS — D509 Iron deficiency anemia, unspecified: Secondary | ICD-10-CM | POA: Diagnosis not present

## 2021-07-16 DIAGNOSIS — K219 Gastro-esophageal reflux disease without esophagitis: Secondary | ICD-10-CM | POA: Diagnosis not present

## 2021-07-16 DIAGNOSIS — B9681 Helicobacter pylori [H. pylori] as the cause of diseases classified elsewhere: Secondary | ICD-10-CM | POA: Diagnosis not present

## 2021-07-16 DIAGNOSIS — Z800 Family history of malignant neoplasm of digestive organs: Secondary | ICD-10-CM

## 2021-07-16 DIAGNOSIS — K5730 Diverticulosis of large intestine without perforation or abscess without bleeding: Secondary | ICD-10-CM | POA: Diagnosis not present

## 2021-07-16 MED ORDER — SUCRALFATE 1 G PO TABS: 1.0000 g | ORAL_TABLET | Freq: Two times a day (BID) | ORAL | 0 refills | Status: DC

## 2021-07-16 MED ORDER — SODIUM CHLORIDE 0.9 % IV SOLN: 500.0000 mL | INTRAVENOUS | Status: DC

## 2021-07-16 NOTE — Op Note (Signed)
[place] Patient Name: Evan Sullivan Procedure Date: 07/16/2021 3:12 PM MRN: [id number] Endoscopist: Nicki Reaper E. Candis Schatz , MD Age: 51 Referring MD:  Date of Birth: February 22, 1970 Gender: Male Account #: [id number] Procedure:                Upper GI endoscopy Indications:              Iron deficiency anemia Medicines:                Monitored Anesthesia Care Procedure:                Pre-Anesthesia Assessment:                           - Prior to the procedure, a History and Physical                            was performed, and patient medications and                            allergies were reviewed. The patient's tolerance of                            previous anesthesia was also reviewed. The risks                            and benefits of the procedure and the sedation                            options and risks were discussed with the patient.                            All questions were answered, and informed consent                            was obtained. Prior Anticoagulants: The patient has                            taken no previous anticoagulant or antiplatelet                            agents. ASA Grade Assessment: II - A patient with                            mild systemic disease. After reviewing the risks                            and benefits, the patient was deemed in                            satisfactory condition to undergo the procedure.                           After obtaining informed consent, the endoscope was  passed under direct vision. Throughout the                            procedure, the patient's blood pressure, pulse, and                            oxygen saturations were monitored continuously. The                            Endoscope was introduced through the mouth, and                            advanced to the third part of duodenum. The upper                            GI endoscopy was accomplished  without difficulty.                            The patient tolerated the procedure well. Scope In: Scope Out: Findings:                 The examined esophagus was normal.                           The gastroesophageal flap valve was visualized                            endoscopically and classified as Hill Grade IV (no                            fold, wide open lumen, hiatal hernia present).                           Localized severe inflammation with hemorrhage                            characterized by adherent blood, erythema and                            shallow ulcerations was found in the gastric                            antrum. Biopsies were taken with a cold forceps for                            Helicobacter pylori testing. Estimated blood loss                            was minimal.                           The exam of the stomach was otherwise normal.                           Localized severe inflammation characterized by  congestion (edema), erythema and shallow                            ulcerations was found in the duodenal bulb and in                            the second portion of the duodenum. Biopsies were                            taken with a cold forceps for histology. Estimated                            blood loss was minimal.                           The exam of the duodenum was otherwise normal. Complications:            No immediate complications. Estimated Blood Loss:     Estimated blood loss was minimal. Impression:               - Normal esophagus.                           - Gastroesophageal flap valve classified as Hill                            Grade IV (no fold, wide open lumen, hiatal hernia                            present).                           - Gastritis with hemorrhage. Biopsied. This is the                            likely source of the patient's iron deficiency                            anemia.                            - Duodenitis. Biopsied. Recommendation:           - Patient has a contact number available for                            emergencies. The signs and symptoms of potential                            delayed complications were discussed with the                            patient. Return to normal activities tomorrow.                            Written discharge instructions were provided to the  patient.                           - Resume previous diet.                           - Continue present medications. Take the Protonix                            40 mg PO daily as prescribed (currently not taking)                           - Await pathology results.                           - Avoid NSAIDs                           - Use sucralfate tablets 1 gram PO BID for 2 weeks. Evalette Montrose E. Candis Schatz, MD 07/16/2021 4:14:10 PM This report has been signed electronically.

## 2021-07-16 NOTE — Progress Notes (Signed)
Pt's states no medical or surgical changes since previsit or office visit. 

## 2021-07-16 NOTE — Progress Notes (Signed)
61Called to room to assist during endoscopic procedure.  Patient ID and intended procedure confirmed with present staff. Received instructions for my participation in the procedure from the performing physician.

## 2021-07-16 NOTE — Op Note (Signed)
[name] Procedure Date: 07/16/2021 3:08 PM MRN: [id number] Endoscopist: Nicki Reaper E. Candis Schatz , MD Age: 51 Referring MD:  Date of Birth: 1970-09-10 Gender: Male Account #: [id number] Procedure:                Colonoscopy Indications:              Iron deficiency anemia Medicines:                Monitored Anesthesia Care Procedure:                Pre-Anesthesia Assessment:                           - Prior to the procedure, a History and Physical                            was performed, and patient medications and                            allergies were reviewed. The patient's tolerance of                            previous anesthesia was also reviewed. The risks                            and benefits of the procedure and the sedation                            options and risks were discussed with the patient.                            All questions were answered, and informed consent                            was obtained. Prior Anticoagulants: The patient has                            taken no previous anticoagulant or antiplatelet                            agents. ASA Grade Assessment: II - A patient with                            mild systemic disease. After reviewing the risks                            and benefits, the patient was deemed in                            satisfactory condition to undergo the procedure.                           After obtaining informed consent, the colonoscope  was passed under direct vision. Throughout the                            procedure, the patient's blood pressure, pulse, and                            oxygen saturations were monitored continuously. The                            Olympus CF-[id number]) Colonoscope was                            introduced through the anus and advanced to the the                            cecum, identified by appendiceal orifice  and                            ileocecal valve. The colonoscopy was performed                            without difficulty. The patient tolerated the                            procedure well. The quality of the bowel                            preparation was adequate. The ileocecal valve,                            appendiceal orifice, and rectum were photographed. Scope In: 3:41:23 PM Scope Out: 4:03:17 PM Scope Withdrawal Time: 0 hours 17 minutes 1 second  Total Procedure Duration: 0 hours 21 minutes 54 seconds  Findings:                 The perianal and digital rectal examinations were                            normal. Pertinent negatives include normal                            sphincter tone and no palpable rectal lesions.                           A 6 mm polyp was found in the ascending colon. The                            polyp was flat. The polyp was removed with a cold                            snare. Resection and retrieval were complete.                            Estimated blood loss was minimal.  Two flat and sessile polyps were found in the                            descending colon. The polyps were 3 to 5 mm in                            size. These polyps were removed with a cold snare.                            Resection and retrieval were complete. Estimated                            blood loss was minimal.                           Multiple small and large-mouthed diverticula were                            found in the sigmoid colon, transverse colon and                            ascending colon.                           The exam was otherwise normal throughout the                            examined colon.                           Non-bleeding internal hemorrhoids were found during                            retroflexion. The hemorrhoids were Grade I                            (internal hemorrhoids that do not prolapse).                            No additional abnormalities were found on                            retroflexion. Complications:            No immediate complications. Estimated Blood Loss:     Estimated blood loss was minimal. Impression:               - One 6 mm polyp in the ascending colon, removed                            with a cold snare. Resected and retrieved.                           - Two 3 to 5 mm polyps in the descending colon,  removed with a cold snare. Resected and retrieved.                           - Diverticulosis in the sigmoid colon, in the                            transverse colon and in the ascending colon.                           - Non-bleeding internal hemorrhoids. Recommendation:           - Patient has a contact number available for                            emergencies. The signs and symptoms of potential                            delayed complications were discussed with the                            patient. Return to normal activities tomorrow.                            Written discharge instructions were provided to the                            patient.                           - Resume previous diet.                           - Continue present medications.                           - Await pathology results.                           - Repeat colonoscopy (date not yet determined) for                            surveillance of multiple polyps. Shylo Dillenbeck E. Candis Schatz, MD 07/16/2021 4:19:16 PM This report has been signed electronically.

## 2021-07-16 NOTE — Patient Instructions (Signed)
Upper Endoscopy   Handout gastritis given to you - you have gastritis and duodenitis  AVOID NSAIDs ( Ibuprofen,Motrin ,Aleve ,Advil Naproxen)   Take Protonix 40 mg daily -best time to take is 30 minutes before eat or drink anything in the morning   Take Carafate twice a day for 2 weeks - this order sent to your pharmacy for you to pick up  Await biopsy results    Colonoscopy    Handouts on polyps & diverticulosis & hemorrhoids  given to you   Await pathology results on polyps removed     YOU HAD AN ENDOSCOPIC PROCEDURE TODAY AT [facility]:   Refer to the procedure report that was given to you for any specific questions about what was found during the examination.  If the procedure report does not answer your questions, please call your gastroenterologist to clarify.  If you requested that your care partner not be given the details of your procedure findings, then the procedure report has been included in a sealed envelope for you to review at your convenience later.  YOU SHOULD EXPECT: Some feelings of bloating in the abdomen. Passage of more gas than usual.  Walking can help get rid of the air that was put into your GI tract during the procedure and reduce the bloating. If you had a lower endoscopy (such as a colonoscopy or flexible sigmoidoscopy) you may notice spotting of blood in your stool or on the toilet paper. If you underwent a bowel prep for your procedure, you may not have a normal bowel movement for a few days.  Please Note:  You might notice some irritation and congestion in your nose or some drainage.  This is from the oxygen used during your procedure.  There is no need for concern and it should clear up in a day or so.  SYMPTOMS TO REPORT IMMEDIATELY:  Following lower endoscopy (colonoscopy or flexible sigmoidoscopy):  Excessive amounts of blood in the stool  Significant tenderness or worsening of abdominal pains  Swelling of the abdomen  that is new, acute  Fever of 100F or higher  Following upper endoscopy (EGD)  Vomiting of blood or coffee ground material  New chest pain or pain under the shoulder blades  Painful or persistently difficult swallowing  New shortness of breath  Fever of 100F or higher  Black, tarry-looking stools  For urgent or emergent issues, a gastroenterologist can be reached at any hour by calling [phone]. Do not use MyChart messaging for urgent concerns.    DIET:  We do recommend a small meal at first, but then you may proceed to your regular diet.  Drink plenty of fluids but you should avoid alcoholic beverages for 24 hours.  ACTIVITY:  You should plan to take it easy for the rest of today and you should NOT DRIVE or use heavy machinery until tomorrow (because of the sedation medicines used during the test).    FOLLOW UP: Our staff will call the number listed on your records 24-72 hours following your procedure to check on you and address any questions or concerns that you may have regarding the information given to you following your procedure. If we do not reach you, we will leave a message.  We will attempt to reach you two times.  During this call, we will ask if you have developed any symptoms of COVID 19. If you develop any symptoms (ie: fever, flu-like symptoms, shortness of breath, cough etc.)  before then, please call [phone].  If you test positive for Covid 19 in the 2 weeks post procedure, please call and report this information to [place].    If any biopsies were taken you will be contacted by phone or by letter within the next 1-3 weeks.  Please call us at [phone] if you have not heard about the biopsies in 3 weeks.    SIGNATURES/CONFIDENTIALITY: You and/or your care partner have signed paperwork which will be entered into your electronic medical record.  These signatures attest to the fact that that the information above on your After Visit Summary has been reviewed and  is understood.  Full responsibility of the confidentiality of this discharge information lies with you and/or your care-partner.

## 2021-07-16 NOTE — Progress Notes (Signed)
Report given to PACU RN VSS BBS=Clear

## 2021-07-16 NOTE — Progress Notes (Signed)
[facility] History and Physical   Primary Care Physician:  [name], NP   Reason for Procedure:   Iron deficiency anemia  Plan:    EGD, colonoscopy     HPI: Evan Sullivan is a 51 y.o. male undergoing EGD and colonoscopy to evaluate iron deficiency anemia.  He denies any chronic GI symptoms.   His father was diagnosed with colon cancer in his 34s.  He has a history of pancreatitis and heavy alcohol use.  He had a colonoscopy in 2021 in which a 15 mm lipoma was removed.   Past Medical History:  Diagnosis Date   Anemia    Arthritis    Avascular necrosis of femoral head, right ([place]) 2019   GERD (gastroesophageal reflux disease)    Gout    Hypertension    Peptic ulcer disease     Past Surgical History:  Procedure Laterality Date   COLONOSCOPY     COLONOSCOPY WITH PROPOFOL N/A 11/30/2019   Procedure: COLONOSCOPY WITH PROPOFOL;  Surgeon: Lin Landsman, MD;  Location: [facility];  Service: Endoscopy;  Laterality: N/A;  priority 4   KNEE ARTHROSCOPY, MEDIAL PATELLO FEMORAL LIGAMENT REPAIR Bilateral 2004,2000   POLYPECTOMY  11/30/2019   Procedure: POLYPECTOMY;  Surgeon: Lin Landsman, MD;  Location: [place];  Service: Endoscopy;;   TOTAL HIP ARTHROPLASTY Right 08/02/2017   Procedure: TOTAL HIP ARTHROPLASTY;  Surgeon: Dereck Leep, MD;  Location: [facility];  Service: Orthopedics;  Laterality: Right;    Prior to Admission medications   Medication Sig Start Date End Date Taking? Authorizing Provider  amLODipine (NORVASC) 10 MG tablet TAKE 1 TABLET BY MOUTH EVERY DAY 04/06/21  Yes McDonough, Lauren K, PA-C  atenolol (TENORMIN) 100 MG tablet TAKE 1 TABLET BY MOUTH EVERY DAY 04/09/21  Yes Lavera Guise, MD  fenofibrate (TRICOR) 48 MG tablet TAKE 1 TABLET BY MOUTH EVERY DAY 02/11/21  Yes McDonough, Lauren K, PA-C  ferrous gluconate (FERGON) 324 MG tablet Take 1 tablet (324 mg total) by mouth daily with breakfast. Patient taking  differently: Take 324 mg by mouth every other day. 05/17/21  Yes Oswald Hillock, MD  colchicine 0.6 MG tablet Take 1 capsule by mouth daily as needed (gout pain) 06/26/20   McDonough, Si Gaul, PA-C  hydrocortisone cream 1 % Apply 1 application topically daily as needed (rash).    [provider]  ondansetron (ZOFRAN) 4 MG tablet Take 1 tablet (4 mg total) by mouth every 8 (eight) hours as needed for nausea or vomiting. 04/12/21   Horton, Alvin Critchley, DO  pantoprazole (PROTONIX) 40 MG tablet Take 1 tablet (40 mg total) by mouth daily as needed (acid reflux). Patient not taking: Reported on 07/16/2021 04/12/21   Horton, Drue Dun M, DO  polyethylene glycol (MIRALAX / GLYCOLAX) 17 g packet Take 17 g by mouth daily as needed for moderate constipation. Patient not taking: Reported on 06/12/2021 05/26/21   [name], NP    Current Outpatient Medications  Medication Sig Dispense Refill   amLODipine (NORVASC) 10 MG tablet TAKE 1 TABLET BY MOUTH EVERY DAY 30 tablet 3   atenolol (TENORMIN) 100 MG tablet TAKE 1 TABLET BY MOUTH EVERY DAY 90 tablet 0   fenofibrate (TRICOR) 48 MG tablet TAKE 1 TABLET BY MOUTH EVERY DAY 90 tablet 1   ferrous gluconate (FERGON) 324 MG tablet Take 1 tablet (324 mg total) by mouth daily with breakfast. (Patient taking differently: Take 324 mg by mouth every other  day.) 30 tablet 3   colchicine 0.6 MG tablet Take 1 capsule by mouth daily as needed (gout pain) 30 tablet 1   hydrocortisone cream 1 % Apply 1 application topically daily as needed (rash).     ondansetron (ZOFRAN) 4 MG tablet Take 1 tablet (4 mg total) by mouth every 8 (eight) hours as needed for nausea or vomiting. 4 tablet 0   pantoprazole (PROTONIX) 40 MG tablet Take 1 tablet (40 mg total) by mouth daily as needed (acid reflux). (Patient not taking: Reported on 07/16/2021) 30 tablet 5   polyethylene glycol (MIRALAX / GLYCOLAX) 17 g packet Take 17 g by mouth daily as needed for moderate constipation. (Patient not  taking: Reported on 06/12/2021) 14 each 3   Current Facility-Administered Medications  Medication Dose Route Frequency Provider Last Rate Last Admin   0.9 %  sodium chloride infusion  500 mL Intravenous Continuous Daryel November, MD        Allergies as of 07/16/2021   (No Known Allergies)    Family History  Problem Relation Age of Onset   Hypertension Mother    Hypertension Father    Colon cancer Father 50   Diabetes Father    Pancreatic cancer Neg Hx    Esophageal cancer Neg Hx    Liver cancer Neg Hx    Stomach cancer Neg Hx     Social History   Socioeconomic History   Marital status: Single    Spouse name: Not on file   Number of children: Not on file   Years of education: Not on file   Highest education level: Not on file  Occupational History   Not on file  Tobacco Use   Smoking status: Never   Smokeless tobacco: Never  Vaping Use   Vaping Use: Never used  Substance and Sexual Activity   Alcohol use: Not Currently   Drug use: Yes    Types: Marijuana    Comment: daily    Sexual activity: Yes    Birth control/protection: Condom  Other Topics Concern   Not on file  Social History Narrative   Not on file   Social Determinants of Health   Financial Resource Strain: Not on file  Food Insecurity: Not on file  Transportation Needs: Not on file  Physical Activity: Not on file  Stress: Not on file  Social Connections: Not on file  Intimate Partner Violence: Not on file    Review of Systems:  All other review of systems negative except as mentioned in the HPI.  Physical Exam: Vital signs BP (!) 158/98   Pulse 60   Temp 98 F (36.7 C)   General:   Alert,  Well-developed, well-nourished, pleasant and cooperative in NAD Airway:  Mallampati 2 Lungs:  Clear throughout to auscultation.   Heart:  Regular rate and rhythm; no murmurs, clicks, rubs,  or gallops. Abdomen:  Soft, nontender and nondistended. Normal bowel sounds.   Neuro/Psych:  Normal mood  and affect. A and O x 3   Leightyn Cina E. Candis Schatz, MD [facility]

## 2021-07-17 ENCOUNTER — Telehealth: Payer: Self-pay | Admitting: *Deleted

## 2021-07-17 NOTE — Telephone Encounter (Signed)
No answer on second attempt follow up call.

## 2021-07-17 NOTE — Telephone Encounter (Signed)
Attempted to call patient for their post-procedure follow-up call. No answer. Left voicemail.   

## 2021-07-21 ENCOUNTER — Other Ambulatory Visit: Payer: Self-pay

## 2021-07-21 DIAGNOSIS — A048 Other specified bacterial intestinal infections: Secondary | ICD-10-CM

## 2021-07-21 MED ORDER — DOXYCYCLINE HYCLATE 100 MG PO CAPS: 100.0000 mg | ORAL_CAPSULE | Freq: Two times a day (BID) | ORAL | 0 refills | Status: DC

## 2021-07-21 MED ORDER — BISMUTH SUBSALICYLATE 262 MG PO CHEW: 524.0000 mg | CHEWABLE_TABLET | Freq: Three times a day (TID) | ORAL | 0 refills | Status: DC

## 2021-07-21 MED ORDER — PANTOPRAZOLE SODIUM 40 MG PO TBEC: 40.0000 mg | DELAYED_RELEASE_TABLET | Freq: Two times a day (BID) | ORAL | 0 refills | Status: DC

## 2021-07-21 MED ORDER — METRONIDAZOLE 250 MG PO TABS: 250.0000 mg | ORAL_TABLET | Freq: Four times a day (QID) | ORAL | 0 refills | Status: DC

## 2021-07-21 NOTE — Progress Notes (Signed)
Evan Sullivan,  The biopsies from the recent upper GI Endoscopy were notable for H. Pylori gastritis, and will plan on treating with quad therapy as below. Please confirm no medication allergies to the prescribed regimen.   1) Pantoprazole 40 mg 2 times a day x 14 d 2) Pepto Bismol 2 tabs (262 mg each) 4 times a day x 14 d 3) Metronidazole 250 mg 4 times a day x 14 d 4) doxycycline 100 mg 2 times a day x 14 d  Four weeks after treatment completed, check H. Pylori stool antigen to confirm eradication (must be off pantoprazole for 2 weeks prior to specimen submission)  Dx: H. Pylori gastritis   One polyp which I removed during your recent coloscopy was proven to be completely benign but is considered a "pre-cancerous" polyp that MAY have grown into cancer if it had not been removed.  Studies shows that at least 20% of women over age 50 and 30% of men over age 50 have pre-cancerous polyps.  Based on current nationally recognized surveillance guidelines, I recommend that you have a repeat colonoscopy in 7 years.   If you develop any new rectal bleeding, abdominal pain or significant bowel habit changes, please contact me before then.

## 2021-07-22 ENCOUNTER — Telehealth: Payer: Self-pay | Admitting: Gastroenterology

## 2021-07-22 NOTE — Telephone Encounter (Signed)
Spoke with pt and discussed how he is supposed to take his antibiotics, his questions were answered.

## 2021-07-28 ENCOUNTER — Other Ambulatory Visit: Payer: Self-pay | Admitting: Nurse Practitioner

## 2021-07-28 ENCOUNTER — Telehealth: Payer: Self-pay | Admitting: Nurse Practitioner

## 2021-07-28 DIAGNOSIS — I10 Essential (primary) hypertension: Secondary | ICD-10-CM

## 2021-07-28 MED ORDER — AMLODIPINE BESYLATE 10 MG PO TABS: 10.0000 mg | ORAL_TABLET | Freq: Every day | ORAL | 1 refills | Status: DC

## 2021-07-28 MED ORDER — ATENOLOL 100 MG PO TABS: 100.0000 mg | ORAL_TABLET | Freq: Every day | ORAL | 1 refills | Status: DC

## 2021-07-30 ENCOUNTER — Inpatient Hospital Stay: Payer: BC Managed Care – PPO | Attending: Oncology

## 2021-07-30 ENCOUNTER — Inpatient Hospital Stay (HOSPITAL_BASED_OUTPATIENT_CLINIC_OR_DEPARTMENT_OTHER): Payer: BC Managed Care – PPO | Admitting: Oncology

## 2021-07-30 ENCOUNTER — Other Ambulatory Visit: Payer: Self-pay

## 2021-07-30 VITALS — BP 159/102 | HR 75 | Temp 98.6°F | Resp 16 | Wt 220.0 lb

## 2021-07-30 DIAGNOSIS — D509 Iron deficiency anemia, unspecified: Secondary | ICD-10-CM

## 2021-07-30 DIAGNOSIS — K219 Gastro-esophageal reflux disease without esophagitis: Secondary | ICD-10-CM | POA: Diagnosis not present

## 2021-07-30 DIAGNOSIS — Z833 Family history of diabetes mellitus: Secondary | ICD-10-CM | POA: Diagnosis not present

## 2021-07-30 DIAGNOSIS — F1290 Cannabis use, unspecified, uncomplicated: Secondary | ICD-10-CM | POA: Diagnosis not present

## 2021-07-30 DIAGNOSIS — Z800 Family history of malignant neoplasm of digestive organs: Secondary | ICD-10-CM | POA: Insufficient documentation

## 2021-07-30 DIAGNOSIS — Z79899 Other long term (current) drug therapy: Secondary | ICD-10-CM | POA: Insufficient documentation

## 2021-07-30 DIAGNOSIS — I10 Essential (primary) hypertension: Secondary | ICD-10-CM | POA: Insufficient documentation

## 2021-07-30 DIAGNOSIS — Z8249 Family history of ischemic heart disease and other diseases of the circulatory system: Secondary | ICD-10-CM | POA: Diagnosis not present

## 2021-07-30 LAB — TSH: TSH: 2.004 u[IU]/mL (ref 0.350–4.500)

## 2021-07-30 LAB — IRON AND TIBC
Iron: 58 ug/dL (ref 45–182)
Saturation Ratios: 12 % — ABNORMAL LOW (ref 17.9–39.5)
TIBC: 501 ug/dL — ABNORMAL HIGH (ref 250–450)
UIBC: 443 ug/dL

## 2021-07-30 LAB — CBC WITH DIFFERENTIAL/PLATELET
Abs Immature Granulocytes: 0 10*3/uL (ref 0.00–0.07)
Basophils Absolute: 0.1 10*3/uL (ref 0.0–0.1)
Basophils Relative: 1 %
Eosinophils Absolute: 0.3 10*3/uL (ref 0.0–0.5)
Eosinophils Relative: 6 %
HCT: 42.8 % (ref 39.0–52.0)
Hemoglobin: 14 g/dL (ref 13.0–17.0)
Immature Granulocytes: 0 %
Lymphocytes Relative: 39 %
Lymphs Abs: 2 10*3/uL (ref 0.7–4.0)
MCH: 28.2 pg (ref 26.0–34.0)
MCHC: 32.7 g/dL (ref 30.0–36.0)
MCV: 86.1 fL (ref 80.0–100.0)
Monocytes Absolute: 0.4 10*3/uL (ref 0.1–1.0)
Monocytes Relative: 8 %
Neutro Abs: 2.3 10*3/uL (ref 1.7–7.7)
Neutrophils Relative %: 46 %
Platelets: 327 10*3/uL (ref 150–400)
RBC: 4.97 MIL/uL (ref 4.22–5.81)
RDW: 16.2 % — ABNORMAL HIGH (ref 11.5–15.5)
WBC: 5.1 10*3/uL (ref 4.0–10.5)
nRBC: 0 % (ref 0.0–0.2)

## 2021-07-30 LAB — VITAMIN B12: Vitamin B-12: 322 pg/mL (ref 180–914)

## 2021-07-30 LAB — FERRITIN: Ferritin: 23 ng/mL — ABNORMAL LOW (ref 24–336)

## 2021-07-30 LAB — FOLATE: Folate: 18.3 ng/mL (ref >5.9)

## 2021-07-30 NOTE — Progress Notes (Signed)
Pt in for follow up, recently diagnosed with intestinal inflammation and ulcer.  Pt has not had atenolol in 2 days and just got it refilled yesterday and has not taken med yet. States will take when he gets home today.

## 2021-08-01 LAB — CELIAC DISEASE PANEL
Endomysial Ab, IgA: NEGATIVE
IgA: 160 mg/dL (ref 90–386)
Tissue Transglutaminase Ab, IgA: <2 U/mL (ref 0–3)

## 2021-08-02 ENCOUNTER — Encounter: Payer: Self-pay | Admitting: Oncology

## 2021-08-02 NOTE — Progress Notes (Signed)
Hematology/Oncology Consult note [facility]  Telephone:([phone] [phone]  Patient Care Team: [name], NP as PCP - General (Family Medicine)   Name of the patient: Evan Sullivan  [id number]  02-Jul-1970   Date of visit: 08/02/21  Diagnosis-iron deficiency anemia  Chief complaint/ Reason for visit-routine follow-up of iron deficiency anemia  Heme/Onc history:  Patient is a 51 year old African-American male currently referred for iron deficiency anemia.  His most recent CBC from 05/26/2021 showed white cell count of 8.8, H&H of 10.7/31.9 with an MCV of 83 and a platelet count of 839.  Looking back at his prior CBCs his hemoglobin at baseline runs between 14-15 and his platelet counts historically have been normal.  Iron studies showed low iron saturation of 2% and elevated TIBC of 572.  Ferritin levels were not checked at that time.  Patient has had a colonoscopy with Dr. Marius Ditch in October 2021 which showed a 15 mm polyp in the ascending colon and 2 small polyps in the rectum.  Repeat colonoscopy was recommended in 3 years and biopsies were negative for dysplasia and malignancy.  Given recurrent iron deficiency patient was seen by GI again and underwent EGD.  Biopsies positive for H. pylori and patient was started on anti-H. pylori therapy.  Patient has not required any IV iron for his iron deficiency and decided to proceed with oral iron.  Interval history-reports improvement in his energy levels with oral iron.  Denies any specific complaints at this time  ECOG PS- 0 Pain scale- 0   Review of systems- Review of Systems  Constitutional:  Negative for chills, fever, malaise/fatigue and weight loss.  HENT:  Negative for congestion, ear discharge and nosebleeds.   Eyes:  Negative for blurred vision.  Respiratory:  Negative for cough, hemoptysis, sputum production, shortness of breath and wheezing.   Cardiovascular:  Negative for chest pain,  palpitations, orthopnea and claudication.  Gastrointestinal:  Negative for abdominal pain, blood in stool, constipation, diarrhea, heartburn, melena, nausea and vomiting.  Genitourinary:  Negative for dysuria, flank pain, frequency, hematuria and urgency.  Musculoskeletal:  Negative for back pain, joint pain and myalgias.  Skin:  Negative for rash.  Neurological:  Negative for dizziness, tingling, focal weakness, seizures, weakness and headaches.  Endo/Heme/Allergies:  Does not bruise/bleed easily.  Psychiatric/Behavioral:  Negative for depression and suicidal ideas. The patient does not have insomnia.       No Known Allergies   Past Medical History:  Diagnosis Date   Anemia    Arthritis    Avascular necrosis of femoral head, right ([place]) 2019   GERD (gastroesophageal reflux disease)    Gout    Hypertension    Peptic ulcer disease      Past Surgical History:  Procedure Laterality Date   COLONOSCOPY     COLONOSCOPY WITH PROPOFOL N/A 11/30/2019   Procedure: COLONOSCOPY WITH PROPOFOL;  Surgeon: Lin Landsman, MD;  Location: [place];  Service: Endoscopy;  Laterality: N/A;  priority 4   KNEE ARTHROSCOPY, MEDIAL PATELLO FEMORAL LIGAMENT REPAIR Bilateral 2004,2000   POLYPECTOMY  11/30/2019   Procedure: POLYPECTOMY;  Surgeon: Lin Landsman, MD;  Location: [place];  Service: Endoscopy;;   TOTAL HIP ARTHROPLASTY Right 08/02/2017   Procedure: TOTAL HIP ARTHROPLASTY;  Surgeon: Dereck Leep, MD;  Location: [facility];  Service: Orthopedics;  Laterality: Right;    Social History   Socioeconomic History   Marital status: Single    Spouse name:  Not on file   Number of children: Not on file   Years of education: Not on file   Highest education level: Not on file  Occupational History   Not on file  Tobacco Use   Smoking status: Never   Smokeless tobacco: Never  Vaping Use   Vaping Use: Never used  Substance and Sexual Activity   Alcohol use: Not  Currently   Drug use: Yes    Types: Marijuana    Comment: daily    Sexual activity: Yes    Birth control/protection: Condom  Other Topics Concern   Not on file  Social History Narrative   Not on file   Social Determinants of Health   Financial Resource Strain: Not on file  Food Insecurity: Not on file  Transportation Needs: Not on file  Physical Activity: Not on file  Stress: Not on file  Social Connections: Not on file  Intimate Partner Violence: Not on file    Family History  Problem Relation Age of Onset   Hypertension Mother    Hypertension Father    Colon cancer Father 72   Diabetes Father    Pancreatic cancer Neg Hx    Esophageal cancer Neg Hx    Liver cancer Neg Hx    Stomach cancer Neg Hx      Current Outpatient Medications:    amLODipine (NORVASC) 10 MG tablet, Take 1 tablet (10 mg total) by mouth daily., Disp: 90 tablet, Rfl: 1   atenolol (TENORMIN) 100 MG tablet, Take 1 tablet (100 mg total) by mouth daily., Disp: 90 tablet, Rfl: 1   bismuth subsalicylate (PEPTO-BISMOL) 262 MG chewable tablet, Chew 2 tablets (524 mg total) by mouth 4 (four) times daily -  before meals and at bedtime., Disp: 112 tablet, Rfl: 0   doxycycline (VIBRAMYCIN) 100 MG capsule, Take 1 capsule (100 mg total) by mouth 2 (two) times daily., Disp: 28 capsule, Rfl: 0   fenofibrate (TRICOR) 48 MG tablet, TAKE 1 TABLET BY MOUTH EVERY DAY, Disp: 90 tablet, Rfl: 1   metroNIDAZOLE (FLAGYL) 250 MG tablet, Take 1 tablet (250 mg total) by mouth 4 (four) times daily., Disp: 56 tablet, Rfl: 0   pantoprazole (PROTONIX) 40 MG tablet, Take 1 tablet (40 mg total) by mouth daily as needed (acid reflux)., Disp: 30 tablet, Rfl: 5   colchicine 0.6 MG tablet, Take 1 capsule by mouth daily as needed (gout pain) (Patient not taking: Reported on 07/30/2021), Disp: 30 tablet, Rfl: 1   ferrous gluconate (FERGON) 324 MG tablet, Take 1 tablet (324 mg total) by mouth daily with breakfast. (Patient not taking: Reported on  07/30/2021), Disp: 30 tablet, Rfl: 3   hydrocortisone cream 1 %, Apply 1 application topically daily as needed (rash). (Patient not taking: Reported on 07/30/2021), Disp: , Rfl:    ondansetron (ZOFRAN) 4 MG tablet, Take 1 tablet (4 mg total) by mouth every 8 (eight) hours as needed for nausea or vomiting. (Patient not taking: Reported on 07/30/2021), Disp: 4 tablet, Rfl: 0   polyethylene glycol (MIRALAX / GLYCOLAX) 17 g packet, Take 17 g by mouth daily as needed for moderate constipation. (Patient not taking: Reported on 06/12/2021), Disp: 14 each, Rfl: 3  Physical exam:  Vitals:   07/30/21 1434  BP: (!) 159/102  Pulse: 75  Resp: 16  Temp: 98.6 F (37 C)  TempSrc: Tympanic  SpO2: 95%  Weight: 220 lb (99.8 kg)   Physical Exam Cardiovascular:     Rate and Rhythm: Normal rate  and regular rhythm.     Heart sounds: Normal heart sounds.  Pulmonary:     Effort: Pulmonary effort is normal.  Skin:    General: Skin is warm and dry.  Neurological:     Mental Status: He is alert and oriented to person, place, and time.         Latest Ref Rng & Units 06/04/2021    2:17 PM  CMP  Glucose 70 - 99 mg/dL 102   BUN 6 - 24 mg/dL 13   Creatinine 0.76 - 1.27 mg/dL 0.95   Sodium 134 - 144 mmol/L 141   Potassium 3.5 - 5.2 mmol/L 4.3   Chloride 96 - 106 mmol/L 106   CO2 20 - 29 mmol/L 21   Calcium 8.7 - 10.2 mg/dL 10.5   Total Protein 6.0 - 8.5 g/dL 7.4   Total Bilirubin 0.0 - 1.2 mg/dL <0.2   Alkaline Phos 44 - 121 IU/L 62   AST 0 - 40 IU/L 16   ALT 0 - 44 IU/L 12       Latest Ref Rng & Units 07/30/2021    1:54 PM  CBC  WBC 4.0 - 10.5 K/uL 5.1   Hemoglobin 13.0 - 17.0 g/dL 14.0   Hematocrit 39.0 - 52.0 % 42.8   Platelets 150 - 400 K/uL 327     No images are attached to the encounter.  No results found.   Assessment and plan- Patient is a 51 y.o. male With iron deficiency anemia likely secondary to H. pylori    Patient is currently being treated for his H. pylori.  Patient also  started taking oral iron and his hemoglobin did improve from 11.3-14.  He never required any IV iron.  His ferritin levels are presently low at 23 and iron studies still show evidence of iron deficiency.  However patient does not wish to receive any IV iron given his improvement in his symptoms.  Also his iron deficiency is likely to improve with time given that his underlying etiology which was H. pylori is currently being treated.  Patient would like to follow-up with his primary care doctor at this time.  He can be referred to [place] in the future if questions or concerns arise    Visit Diagnosis 1. Iron deficiency anemia, unspecified iron deficiency anemia type      Dr. Randa Evens, MD, MPH [facility] [phone] 08/02/2021 8:15 PM

## 2021-08-11 ENCOUNTER — Other Ambulatory Visit: Payer: Self-pay | Admitting: Physician Assistant

## 2021-08-11 DIAGNOSIS — E781 Pure hyperglyceridemia: Secondary | ICD-10-CM

## 2021-08-20 ENCOUNTER — Telehealth: Payer: Self-pay | Admitting: Nurse Practitioner

## 2021-08-20 ENCOUNTER — Other Ambulatory Visit: Payer: Self-pay | Admitting: Nurse Practitioner

## 2021-08-20 DIAGNOSIS — E781 Pure hyperglyceridemia: Secondary | ICD-10-CM

## 2021-08-20 MED ORDER — FENOFIBRATE 48 MG PO TABS: 48.0000 mg | ORAL_TABLET | Freq: Every day | ORAL | 1 refills | Status: DC

## 2021-08-20 NOTE — Telephone Encounter (Signed)
Patient requesting refill of cholesterol medication. Please advise.

## 2021-08-20 NOTE — Telephone Encounter (Signed)
Please let him know that I filled this and sent to [facility]. Thanks so much.   -HB

## 2021-08-26 ENCOUNTER — Ambulatory Visit: Payer: BC Managed Care – PPO | Admitting: Nurse Practitioner

## 2021-09-02 ENCOUNTER — Ambulatory Visit (INDEPENDENT_AMBULATORY_CARE_PROVIDER_SITE_OTHER): Payer: BC Managed Care – PPO | Admitting: Nurse Practitioner

## 2021-09-02 ENCOUNTER — Encounter: Payer: Self-pay | Admitting: Nurse Practitioner

## 2021-09-02 VITALS — BP 133/93 | HR 69 | Temp 98.7°F | Ht 72.0 in | Wt 217.0 lb

## 2021-09-02 DIAGNOSIS — I10 Essential (primary) hypertension: Secondary | ICD-10-CM

## 2021-09-02 DIAGNOSIS — E119 Type 2 diabetes mellitus without complications: Secondary | ICD-10-CM | POA: Diagnosis not present

## 2021-09-02 DIAGNOSIS — M109 Gout, unspecified: Secondary | ICD-10-CM

## 2021-09-02 DIAGNOSIS — E781 Pure hyperglyceridemia: Secondary | ICD-10-CM | POA: Diagnosis not present

## 2021-09-02 LAB — POCT GLYCOSYLATED HEMOGLOBIN (HGB A1C)
HbA1c POC (<> result, manual entry): 6.6 % (ref 4.0–5.6)
HbA1c, POC (controlled diabetic range): 6.6 % (ref 0.0–7.0)
HbA1c, POC (prediabetic range): 6.6 % — AB (ref 5.7–6.4)
Hemoglobin A1C: 6.6 % — AB (ref 4.0–5.6)

## 2021-09-02 MED ORDER — CELECOXIB 200 MG PO CAPS: 200.0000 mg | ORAL_CAPSULE | Freq: Two times a day (BID) | ORAL | 2 refills | Status: DC

## 2021-09-02 NOTE — Progress Notes (Signed)
Established patient visit   Patient: Evan Sullivan   DOB: [birth year]   51 y.o. Male  MRN: [id number] Visit Date: 09/02/2021   Chief Complaint  Patient presents with   Follow-up    3 month follow up diabetes. Patient is having got flare up right wrist.    Subjective    HPI HPI     Follow-up    Additional comments: 3 month follow up diabetes. Patient is having got flare up right wrist.       Last edited by Pennelope Bracken, CMA on 09/02/2021  1:40 PM.      Follow up for hypertension  -typically well controlled  Osteoarthritis of both knees.  --does need to have FMLA paperwork completed.  --having gout flare right wrist.  --difficult to move and flex and extend the right wrist. Interfering with his ability to work normally. States that he has been eating increased amounts of red meat and on Thursday, right before the flare started, he did drink a few beers.  -he is otherwise doing well.     Medications: Outpatient Medications Prior to Visit  Medication Sig   amLODipine (NORVASC) 10 MG tablet Take 1 tablet (10 mg total) by mouth daily.   atenolol (TENORMIN) 100 MG tablet Take 1 tablet (100 mg total) by mouth daily.   colchicine 0.6 MG tablet Take 1 capsule by mouth daily as needed (gout pain)   bismuth subsalicylate (PEPTO-BISMOL) 262 MG chewable tablet Chew 2 tablets (524 mg total) by mouth 4 (four) times daily -  before meals and at bedtime. (Patient not taking: Reported on 09/02/2021)   doxycycline (VIBRAMYCIN) 100 MG capsule Take 1 capsule (100 mg total) by mouth 2 (two) times daily. (Patient not taking: Reported on 09/02/2021)   fenofibrate (TRICOR) 48 MG tablet Take 1 tablet (48 mg total) by mouth daily. (Patient not taking: Reported on 09/02/2021)   ferrous gluconate (FERGON) 324 MG tablet Take 1 tablet (324 mg total) by mouth daily with breakfast. (Patient not taking: Reported on 07/30/2021)   hydrocortisone cream 1 % Apply 1 application topically daily as needed (rash). (Patient  not taking: Reported on 07/30/2021)   metroNIDAZOLE (FLAGYL) 250 MG tablet Take 1 tablet (250 mg total) by mouth 4 (four) times daily. (Patient not taking: Reported on 09/02/2021)   ondansetron (ZOFRAN) 4 MG tablet Take 1 tablet (4 mg total) by mouth every 8 (eight) hours as needed for nausea or vomiting. (Patient not taking: Reported on 07/30/2021)   pantoprazole (PROTONIX) 40 MG tablet Take 1 tablet (40 mg total) by mouth daily as needed (acid reflux). (Patient not taking: Reported on 09/02/2021)   polyethylene glycol (MIRALAX / GLYCOLAX) 17 g packet Take 17 g by mouth daily as needed for moderate constipation. (Patient not taking: Reported on 06/12/2021)   No facility-administered medications prior to visit.    Review of Systems  Constitutional:  Negative for activity change, chills, fatigue and fever.  HENT:  Negative for congestion, postnasal drip, rhinorrhea, sinus pressure, sinus pain, sneezing and sore throat.   Eyes: Negative.   Respiratory:  Negative for cough, shortness of breath and wheezing.   Cardiovascular:  Negative for chest pain and palpitations.  Gastrointestinal:  Negative for constipation, diarrhea, nausea and vomiting.       Acid reflux   Endocrine: Negative for cold intolerance, heat intolerance, polydipsia and polyuria.       Mildly elevated blood sugars since last visit   Genitourinary:  Negative for dysuria, frequency and urgency.  Musculoskeletal:  Positive for arthralgias and joint swelling. Negative for back pain and myalgias.       Mild pain in right wrist. Swelling noted. Reduced ROM, but gradually improving   Skin:  Negative for rash.  Allergic/Immunologic: Negative for environmental allergies.  Neurological:  Negative for dizziness, weakness and headaches.  Psychiatric/Behavioral:  The patient is not nervous/anxious.        Objective     Today's Vitals   09/02/21 1343  BP: (!) 133/93  Pulse: 69  Temp: 98.7 F (37.1 C)  SpO2: 98%  Weight: 217 lb (98.4  kg)  Height: 6' (1.829 m)   Body mass index is 29.43 kg/m.   BP Readings from Last 3 Encounters:  09/02/21 (!) 133/93  07/30/21 (!) 159/102  07/16/21 (!) 151/95    Wt Readings from Last 3 Encounters:  09/02/21 217 lb (98.4 kg)  07/30/21 220 lb (99.8 kg)  06/12/21 219 lb 9.6 oz (99.6 kg)    Physical Exam Vitals and nursing note reviewed.  Constitutional:      Appearance: Normal appearance. He is well-developed.  HENT:     Head: Normocephalic and atraumatic.     Nose: Nose normal.     Mouth/Throat:     Mouth: Mucous membranes are moist.     Pharynx: Oropharynx is clear.  Eyes:     Conjunctiva/sclera: Conjunctivae normal.     Pupils: Pupils are equal, round, and reactive to light.  Cardiovascular:     Rate and Rhythm: Normal rate and regular rhythm.     Pulses: Normal pulses.     Heart sounds: Normal heart sounds.  Pulmonary:     Effort: Pulmonary effort is normal.     Breath sounds: Normal breath sounds.  Abdominal:     Palpations: Abdomen is soft.  Musculoskeletal:     Right wrist: Swelling and tenderness present. Decreased range of motion.     Cervical back: Normal range of motion and neck supple.  Lymphadenopathy:     Cervical: No cervical adenopathy.  Skin:    General: Skin is warm and dry.     Capillary Refill: Capillary refill takes less than 2 seconds.  Neurological:     General: No focal deficit present.     Mental Status: He is alert and oriented to person, place, and time.  Psychiatric:        Mood and Affect: Mood normal.        Behavior: Behavior normal.        Thought Content: Thought content normal.        Judgment: Judgment normal.     Results for orders placed or performed in visit on 09/02/21  POCT HgB A1C  Result Value Ref Range   Hemoglobin A1C 6.6 (A) 4.0 - 5.6 %   HbA1c POC (<> result, manual entry) 6.6 4.0 - 5.6 %   HbA1c, POC (prediabetic range) 6.6 (A) 5.7 - 6.4 %   HbA1c, POC (controlled diabetic range) 6.6 0.0 - 7.0 %     Assessment & Plan    1. Diet-controlled type 2 diabetes mellitus (HCC) - POCT HgB A1C  Hemoglobin A1c is 6.6 today.  Patient strongly advised to limit intake of carbohydrates and sugar in diet.  He should increase his daily intake of water to at least 64 ounces per day.  Recheck hemoglobin A1c in 3 months.  Add medications as indicated.  2. Acute gout of right wrist, unspecified cause Colchicine to be taken as needed and as  prescribed.  Add Celebrex 200 mg.  May be taken twice daily only as needed for pain and inflammation.  Should discontinue use if epigastric pain develops.  Take pantoprazole as previously prescribed to prevent new peptic ulceration.  Will complete family paperwork to protect patient strongly in future gout flares occur. - celecoxib (CELEBREX) 200 MG capsule; Take 1 capsule (200 mg total) by mouth 2 (two) times daily.  Dispense: 30 capsule; Refill: 2  3. Essential hypertension Generally stable.  Continue blood pressure medication as prescribed.  4. Pure hypertriglyceridemia Continue Tricor as prescribed.  Problem List Items Addressed This Visit       Cardiovascular and Mediastinum   Essential hypertension     Endocrine   Diet-controlled type 2 diabetes mellitus ([place]) - Primary     Musculoskeletal and Integument   Acute gout of right wrist   Relevant Medications   celecoxib (CELEBREX) 200 MG capsule     Other   HLD (hyperlipidemia)   Relevant Orders   POCT HgB A1C (Completed)     Return in about 3 months (around 12/03/2021) for diabetes with HgbA1c check.         [name], NP  [facility] at [facility] [phone] (phone) [phone] (fax)  [place]

## 2021-09-07 ENCOUNTER — Encounter: Payer: Self-pay | Admitting: Nurse Practitioner

## 2021-09-07 DIAGNOSIS — E119 Type 2 diabetes mellitus without complications: Secondary | ICD-10-CM | POA: Insufficient documentation

## 2021-09-07 DIAGNOSIS — M109 Gout, unspecified: Secondary | ICD-10-CM | POA: Insufficient documentation

## 2021-09-07 HISTORY — DX: Type 2 diabetes mellitus without complications: E11.9

## 2021-09-16 ENCOUNTER — Other Ambulatory Visit: Payer: BC Managed Care – PPO

## 2021-09-22 ENCOUNTER — Other Ambulatory Visit: Payer: BC Managed Care – PPO

## 2021-09-22 DIAGNOSIS — A048 Other specified bacterial intestinal infections: Secondary | ICD-10-CM

## 2021-09-23 ENCOUNTER — Telehealth: Payer: Self-pay | Admitting: Nurse Practitioner

## 2021-09-23 NOTE — Telephone Encounter (Signed)
Patient called regarding his FMLA and is asking the status. Can you please call him at [phone]

## 2021-09-23 NOTE — Telephone Encounter (Signed)
Called pt LVM stating that his pw will be ready by Monday Per Nira Conn

## 2021-09-24 LAB — H. PYLORI ANTIGEN, STOOL: H pylori Ag, Stl: NEGATIVE

## 2021-09-24 NOTE — Progress Notes (Signed)
Mr. Evan Sullivan,  The H. Pylori stool antigen test was normal, indicating that the bacteria was successfully eradicated with the antibiotics.  I am hopeful that your iron deficiency anemia will no longer be an issue.

## 2021-09-29 ENCOUNTER — Telehealth: Payer: Self-pay

## 2021-09-29 NOTE — Telephone Encounter (Signed)
Called pt he is advised that his paper work is ready for pick up

## 2021-10-30 ENCOUNTER — Other Ambulatory Visit: Payer: BC Managed Care – PPO

## 2021-12-23 ENCOUNTER — Encounter: Payer: Self-pay | Admitting: Nurse Practitioner

## 2021-12-23 ENCOUNTER — Ambulatory Visit: Payer: BC Managed Care – PPO | Admitting: Nurse Practitioner

## 2021-12-23 VITALS — BP 149/90 | HR 69 | Resp 18 | Ht 72.0 in | Wt 224.0 lb

## 2021-12-23 DIAGNOSIS — I10 Essential (primary) hypertension: Secondary | ICD-10-CM | POA: Diagnosis not present

## 2021-12-23 DIAGNOSIS — K2100 Gastro-esophageal reflux disease with esophagitis, without bleeding: Secondary | ICD-10-CM | POA: Diagnosis not present

## 2021-12-23 DIAGNOSIS — E119 Type 2 diabetes mellitus without complications: Secondary | ICD-10-CM | POA: Diagnosis not present

## 2021-12-23 DIAGNOSIS — E781 Pure hyperglyceridemia: Secondary | ICD-10-CM | POA: Diagnosis not present

## 2021-12-23 DIAGNOSIS — M87051 Idiopathic aseptic necrosis of right femur: Secondary | ICD-10-CM

## 2021-12-23 DIAGNOSIS — M170 Bilateral primary osteoarthritis of knee: Secondary | ICD-10-CM

## 2021-12-23 LAB — POCT GLYCOSYLATED HEMOGLOBIN (HGB A1C): HbA1c POC (<> result, manual entry): 6.8 % (ref 4.0–5.6)

## 2021-12-23 MED ORDER — SUCRALFATE 1 G PO TABS: 1.0000 g | ORAL_TABLET | Freq: Two times a day (BID) | ORAL | 0 refills | Status: DC

## 2021-12-23 MED ORDER — FENOFIBRATE 48 MG PO TABS: 48.0000 mg | ORAL_TABLET | Freq: Every day | ORAL | 1 refills | Status: DC

## 2021-12-23 NOTE — Progress Notes (Signed)
Established patient visit   Patient: Evan Sullivan   DOB: 1971-01-21   51 y.o. Male  MRN: [id number] Visit Date: 12/23/2021   Chief Complaint  Patient presents with   Follow-up   Diabetes   Abdominal Pain    Mostly at night   Subjective    Diabetes Pertinent negatives for hypoglycemia include no dizziness, headaches or nervousness/anxiousness. Pertinent negatives for diabetes include no chest pain, no fatigue, no polydipsia, no polyuria and no weakness.  Abdominal Pain This is a recurrent problem. The current episode started in the past 7 days. The onset quality is gradual. The problem occurs intermittently. The problem has been waxing and waning. The pain is located in the epigastric region and RUQ. The pain is at a severity of 5/10. The quality of the pain is sharp and colicky. Associated symptoms include belching, diarrhea, flatus and vomiting. Pertinent negatives include no anorexia, constipation, dysuria, fever, frequency, headaches, hematochezia, melena, myalgias or nausea. Nothing aggravates the pain. The pain is relieved by Passing flatus and belching. He has tried proton pump inhibitors and antacids (has accidentally been taking celebrex to relieve abdominal pain. has helped the pain, but likely making situation worse.) for the symptoms. The treatment provided mild relief. Prior diagnostic workup includes upper endoscopy and lower endoscopy. His past medical history is significant for GERD, pancreatitis and PUD.   HPI     Abdominal Pain    Additional comments: Mostly at night      Last edited by Gemma Payor, CMA on 12/23/2021  2:53 PM.       Medications: Outpatient Medications Prior to Visit  Medication Sig   amLODipine (NORVASC) 10 MG tablet Take 1 tablet (10 mg total) by mouth daily.   atenolol (TENORMIN) 100 MG tablet Take 1 tablet (100 mg total) by mouth daily.   colchicine 0.6 MG tablet Take 1 capsule by mouth daily as needed (gout pain)   [DISCONTINUED]  celecoxib (CELEBREX) 200 MG capsule Take 1 capsule (200 mg total) by mouth 2 (two) times daily.   bismuth subsalicylate (PEPTO-BISMOL) 262 MG chewable tablet Chew 2 tablets (524 mg total) by mouth 4 (four) times daily -  before meals and at bedtime. (Patient not taking: Reported on 09/02/2021)   ferrous gluconate (FERGON) 324 MG tablet Take 1 tablet (324 mg total) by mouth daily with breakfast. (Patient not taking: Reported on 07/30/2021)   hydrocortisone cream 1 % Apply 1 application topically daily as needed (rash). (Patient not taking: Reported on 07/30/2021)   ondansetron (ZOFRAN) 4 MG tablet Take 1 tablet (4 mg total) by mouth every 8 (eight) hours as needed for nausea or vomiting. (Patient not taking: Reported on 07/30/2021)   pantoprazole (PROTONIX) 40 MG tablet Take 1 tablet (40 mg total) by mouth daily as needed (acid reflux). (Patient not taking: Reported on 09/02/2021)   polyethylene glycol (MIRALAX / GLYCOLAX) 17 g packet Take 17 g by mouth daily as needed for moderate constipation. (Patient not taking: Reported on 06/12/2021)   [DISCONTINUED] doxycycline (VIBRAMYCIN) 100 MG capsule Take 1 capsule (100 mg total) by mouth 2 (two) times daily. (Patient not taking: Reported on 09/02/2021)   [DISCONTINUED] fenofibrate (TRICOR) 48 MG tablet Take 1 tablet (48 mg total) by mouth daily. (Patient not taking: Reported on 09/02/2021)   [DISCONTINUED] metroNIDAZOLE (FLAGYL) 250 MG tablet Take 1 tablet (250 mg total) by mouth 4 (four) times daily. (Patient not taking: Reported on 09/02/2021)   No facility-administered medications prior to visit.    Review  of Systems  Constitutional:  Negative for activity change, chills, fatigue and fever.  HENT:  Negative for congestion, postnasal drip, rhinorrhea, sinus pressure, sinus pain, sneezing and sore throat.   Eyes: Negative.   Respiratory:  Negative for cough, shortness of breath and wheezing.   Cardiovascular:  Negative for chest pain and palpitations.   Gastrointestinal:  Positive for abdominal pain, diarrhea, flatus and vomiting. Negative for anorexia, constipation, hematochezia, melena and nausea.  Endocrine: Negative for cold intolerance, heat intolerance, polydipsia and polyuria.       Blood sugars doing well. Diet controlled.   Genitourinary:  Negative for dysuria, frequency and urgency.  Musculoskeletal:  Negative for back pain and myalgias.  Skin:  Negative for rash.  Allergic/Immunologic: Negative for environmental allergies.  Neurological:  Negative for dizziness, weakness and headaches.  Psychiatric/Behavioral:  The patient is not nervous/anxious.        Objective     Today's Vitals   12/23/21 1452 12/23/21 1503 12/23/21 1528  BP: (Abnormal) 159/69 (Abnormal) 154/98 (Abnormal) 149/90  Pulse: 64  69  Resp: 18    SpO2: 99%    Weight: 224 lb (101.6 kg)    Height: 6' (1.829 m)     Body mass index is 30.38 kg/m.  BP Readings from Last 3 Encounters:  12/23/21 (Abnormal) 149/90  09/02/21 (Abnormal) 133/93  07/30/21 (Abnormal) 159/102    Wt Readings from Last 3 Encounters:  12/23/21 224 lb (101.6 kg)  09/02/21 217 lb (98.4 kg)  07/30/21 220 lb (99.8 kg)    Physical Exam Vitals and nursing note reviewed.  Constitutional:      Appearance: Normal appearance. He is well-developed.  HENT:     Head: Normocephalic and atraumatic.     Nose: Nose normal.     Mouth/Throat:     Mouth: Mucous membranes are moist.     Pharynx: Oropharynx is clear.  Eyes:     Extraocular Movements: Extraocular movements intact.     Conjunctiva/sclera: Conjunctivae normal.     Pupils: Pupils are equal, round, and reactive to light.  Cardiovascular:     Rate and Rhythm: Normal rate and regular rhythm.     Pulses: Normal pulses.     Heart sounds: Normal heart sounds.  Pulmonary:     Effort: Pulmonary effort is normal.     Breath sounds: Normal breath sounds.  Abdominal:     General: Bowel sounds are normal. There is no distension.      Palpations: Abdomen is soft. There is no mass.     Tenderness: There is abdominal tenderness. There is no right CVA tenderness, left CVA tenderness, guarding or rebound.     Hernia: No hernia is present.     Comments: Mild, generalized abdominal tenderness with moderate palpation.   Musculoskeletal:        General: Normal range of motion.     Cervical back: Normal range of motion and neck supple.  Lymphadenopathy:     Cervical: No cervical adenopathy.  Skin:    General: Skin is warm and dry.     Capillary Refill: Capillary refill takes less than 2 seconds.  Neurological:     General: No focal deficit present.     Mental Status: He is alert and oriented to person, place, and time.  Psychiatric:        Mood and Affect: Mood normal.        Behavior: Behavior normal.        Thought Content: Thought content normal.  Judgment: Judgment normal.      Results for orders placed or performed in visit on 12/23/21  POCT HgB A1C  Result Value Ref Range   Hemoglobin A1C     HbA1c POC (<> result, manual entry) 6.8 4.0 - 5.6 %   HbA1c, POC (prediabetic range)     HbA1c, POC (controlled diabetic range)      Assessment & Plan    1. Diet-controlled type 2 diabetes mellitus (HCC) HgbA1c 6.8 today. Continue to control with diet and exercise. Reassess HgbA1c in 3 months  - POCT HgB A1C  2. Gastroesophageal reflux disease with esophagitis, unspecified whether hemorrhage This may be due to excess use celebrex. Advise he stop this. Add sucralfate 1000 mg twice daily with meals. Continue protonix as previously prescribed. May need new referral to GI - sucralfate (CARAFATE) 1 g tablet; Take 1 tablet (1 g total) by mouth 2 (two) times daily.  Dispense: 28 tablet; Refill: 0  3. Essential hypertension  General, this is stable. Continue ood pressure medication as prescribed   4. Pure hypertriglyceridemia Restart fenofibrate 48 mg daily.  - fenofibrate (TRICOR) 48 MG tablet; Take 1 tablet (48 mg  total) by mouth daily.  Dispense: 90 tablet; Refill: 1  5. Primary osteoarthritis of both knees Recommend he take tylenol as needed and as indicated. Recommend he stop using celebrex in excess. May need referral to orthopedics.    Problem List Items Addressed This Visit       Cardiovascular and Mediastinum   Essential hypertension   Relevant Medications   fenofibrate (TRICOR) 48 MG tablet     Digestive   Gastroesophageal reflux disease with esophagitis   Relevant Medications   sucralfate (CARAFATE) 1 g tablet     Endocrine   Diet-controlled type 2 diabetes mellitus (HCC) - Primary   Relevant Orders   POCT HgB A1C (Completed)     Musculoskeletal and Integument   Avascular necrosis of bone of hip, right (HCC)   Primary osteoarthritis of both knees     Other   HLD (hyperlipidemia)   Relevant Medications   fenofibrate (TRICOR) 48 MG tablet     Return in about 3 months (around 03/25/2022) for diabetes with HgbA1c check.         [name], NP  [facility] at [facility] [phone] (phone) [phone] (fax)  [place]

## 2022-01-04 DIAGNOSIS — K2100 Gastro-esophageal reflux disease with esophagitis, without bleeding: Secondary | ICD-10-CM | POA: Insufficient documentation

## 2022-01-07 ENCOUNTER — Telehealth: Payer: Self-pay

## 2022-01-07 ENCOUNTER — Telehealth: Payer: Self-pay | Admitting: *Deleted

## 2022-01-07 NOTE — Telephone Encounter (Signed)
Pt called stating that he is still having stomach cramps and wanted to see if there were any other suggestions you could give him for this.  He said you had prescribed him some medication but it did not clear it up. Please advise. Frederika Hukill Zimmerman Rumple, CMA

## 2022-01-07 NOTE — Telephone Encounter (Signed)
Called pt he is advised to reach out to his GI doctor pt was agreeable to call an set up an appt.

## 2022-01-16 ENCOUNTER — Encounter: Payer: Self-pay | Admitting: Nurse Practitioner

## 2022-01-16 ENCOUNTER — Other Ambulatory Visit (INDEPENDENT_AMBULATORY_CARE_PROVIDER_SITE_OTHER): Payer: BC Managed Care – PPO

## 2022-01-16 ENCOUNTER — Ambulatory Visit (INDEPENDENT_AMBULATORY_CARE_PROVIDER_SITE_OTHER): Payer: BC Managed Care – PPO | Admitting: Nurse Practitioner

## 2022-01-16 VITALS — BP 130/86 | HR 65 | Ht 72.0 in | Wt 216.5 lb

## 2022-01-16 DIAGNOSIS — R1010 Upper abdominal pain, unspecified: Secondary | ICD-10-CM

## 2022-01-16 DIAGNOSIS — K2970 Gastritis, unspecified, without bleeding: Secondary | ICD-10-CM

## 2022-01-16 LAB — COMPREHENSIVE METABOLIC PANEL
ALT: 13 U/L (ref 0–53)
AST: 14 U/L (ref 0–37)
Albumin: 4.3 g/dL (ref 3.5–5.2)
Alkaline Phosphatase: 57 U/L (ref 39–117)
BUN: 13 mg/dL (ref 6–23)
CO2: 28 mEq/L (ref 19–32)
Calcium: 9.9 mg/dL (ref 8.4–10.5)
Chloride: 103 mEq/L (ref 96–112)
Creatinine, Ser: 1.02 mg/dL (ref 0.40–1.50)
GFR: 85.65 mL/min (ref >60.00)
Glucose, Bld: 98 mg/dL (ref 70–99)
Potassium: 4.6 mEq/L (ref 3.5–5.1)
Sodium: 138 mEq/L (ref 135–145)
Total Bilirubin: 0.4 mg/dL (ref 0.2–1.2)
Total Protein: 7.3 g/dL (ref 6.0–8.3)

## 2022-01-16 LAB — CBC WITH DIFFERENTIAL/PLATELET
Basophils Absolute: 0 10*3/uL (ref 0.0–0.1)
Basophils Relative: 0.6 % (ref 0.0–3.0)
Eosinophils Absolute: 0.3 10*3/uL (ref 0.0–0.7)
Eosinophils Relative: 6.6 % — ABNORMAL HIGH (ref 0.0–5.0)
HCT: 43.5 % (ref 39.0–52.0)
Hemoglobin: 15 g/dL (ref 13.0–17.0)
Lymphocytes Relative: 49.4 % — ABNORMAL HIGH (ref 12.0–46.0)
Lymphs Abs: 2.5 10*3/uL (ref 0.7–4.0)
MCHC: 34.5 g/dL (ref 30.0–36.0)
MCV: 93.6 fl (ref 78.0–100.0)
Monocytes Absolute: 0.4 10*3/uL (ref 0.1–1.0)
Monocytes Relative: 8.4 % (ref 3.0–12.0)
Neutro Abs: 1.8 10*3/uL (ref 1.4–7.7)
Neutrophils Relative %: 35 % — ABNORMAL LOW (ref 43.0–77.0)
Platelets: 324 10*3/uL (ref 150.0–400.0)
RBC: 4.65 Mil/uL (ref 4.22–5.81)
RDW: 12.6 % (ref 11.5–15.5)
WBC: 5.1 10*3/uL (ref 4.0–10.5)

## 2022-01-16 LAB — LIPASE: Lipase: 546 U/L — ABNORMAL HIGH (ref 11.0–59.0)

## 2022-01-16 MED ORDER — PANTOPRAZOLE SODIUM 40 MG PO TBEC: 40.0000 mg | DELAYED_RELEASE_TABLET | Freq: Every day | ORAL | 2 refills | Status: DC

## 2022-01-16 MED ORDER — SUCRALFATE 1 G PO TABS: 1.0000 g | ORAL_TABLET | Freq: Every day | ORAL | 0 refills | Status: DC

## 2022-01-16 NOTE — Progress Notes (Unsigned)
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 Pylori gastritis and colon polyps.   He presents today for further evaluation regarding upper abdominal cramping pain. Yesterday, felt a lot better. He as been eating better. He's had a little. No BM today. Yesterday, a runny will with pellets. No melena or black stools.   No alcohol x 2 weeks. He was drinking 2 or 3 days in a row 6 pack. Her PCP thought her b  Ibuprofen x 3 days one tab once daily x 3.   Father with history of colon cancer diagnosed in his 2's.   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 He does have referral to surgery to further discuss potential surgical correction.      Latest Ref Rng & Units 07/30/2021    1:54 PM 06/04/2021    2:17 PM 05/26/2021    1:22 PM  CBC  WBC 4.0 - 10.5 K/uL 5.1  5.1  8.8   Hemoglobin 13.0 - 17.0 g/dL 14.0  11.3  10.7   Hematocrit 39.0 - 52.0 % 42.8  34.4  31.9   Platelets 150 - 400 K/uL 327  620  839        Latest Ref Rng & Units 06/04/2021    2:17 PM 05/26/2021    1:22 PM 05/16/2021    5:22 AM  CMP  Glucose 70 - 99 mg/dL 102  86  115   BUN 6 - 24 mg/dL '13  8  10   '$ Creatinine 0.76 - 1.27 mg/dL 0.95  0.84  0.80   Sodium 134 - 144 mmol/L 141  138  134   Potassium 3.5 - 5.2 mmol/L 4.3  4.5  4.1   Chloride 96 - 106 mmol/L 106  103  102   CO2 20 - 29 mmol/L '21  21  24   '$ Calcium 8.7 - 10.2 mg/dL 10.5  10.3  9.1   Total Protein 6.0 - 8.5 g/dL 7.4  7.4    Total Bilirubin 0.0 - 1.2 mg/dL <0.2  <0.2    Alkaline Phos 44 - 121 IU/L 62  73    AST 0 - 40 IU/L 16  14    ALT 0 - 44 IU/L 12  17      EGD 07/16/2021: - Normal  esophagus. - Gastroesophageal flap valve classified as Hill Grade IV (no fold, wide open lumen, hiatal hernia present). - Gastritis with hemorrhage.  Localized severe inflammation with hemorrhage characterized by adherent blood, erythema and shallow ulcerations was found in the gastric antrum. Biopsies were taken with a cold forceps for Helicobacter pylori testing. Estimated blood loss was minimal.Biopsied. This is the likely source of the patient's iron deficiency anemia. - Duodenitis. - Localized severe inflammation characterized by congestion (edema), erythema and shallow ulcerations was found in the duodenal bulb and in the second portion of the duodenum. Biopsies were taken with a cold forceps for histology. Estimated blood loss was minimal.  Colonoscopy 07/16/2021: - One 6 mm polyp in the ascending colon, removed with a cold snare. Resected and retrieved. - Two 3 to 5 mm polyps in the descending colon, removed retrieved. - Diverticulosis in the sigmoid colon, in  the transverse colon and in the ascending colon. - Non-bleeding internal hemorrhoids. - 7 year recall colonoscopy  1. Surgical [P], duodenal biopsies  ACUTE DUODENITIS 2. 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 Surgical [P], colon, ascending and descending, polyp (3) TUBULAR ADENOMA HYPERPLASTIC POLYP NEGATIVE FOR HIGH-GRADE DYSPLASIA AND CARCINOMA  Colonoscopy 11/30/2019: - One 15 mm polyp in the ascending colon, removed with a hot snare. Resected and retrieved. Clip (MR conditional) was placed. - Two 3 to 5 mm polyps in the rectum, removed with a cold snare. Resected and retrieved. - The examination was otherwise normal. - The distal rectum and anal verge are normal on  retroflexion view. A. COLON POLYP, ASCENDING; HOT SNARE:  - COLONIC MUCOSA WITH SUBMUCOSAL LIPOMA.  - NEGATIVE FOR DYSPLASIA AND MALIGNANCY.  B.  RECTUM POLYP; COLD SNARE:  - HYPERPLASTIC POLYP.  - NEGATIVE FOR DYSPLASIA AND MALIGNANCY.    Past Medical History:  Diagnosis Date   Anemia    Arthritis    Avascular necrosis of femoral head, right ([place]) 2019   Diet-controlled type 2 diabetes mellitus ([place]) 09/07/2021   GERD (gastroesophageal reflux disease)    Gout    Hypertension    Peptic ulcer disease    Past Surgical History:  Procedure Laterality Date   COLONOSCOPY     COLONOSCOPY WITH PROPOFOL N/A 11/30/2019   Procedure: COLONOSCOPY WITH PROPOFOL;  Surgeon: Lin Landsman, MD;  Location: [place];  Service: Endoscopy;  Laterality: N/A;  priority 4   KNEE ARTHROSCOPY, MEDIAL PATELLO FEMORAL LIGAMENT REPAIR Bilateral 2004,2000   POLYPECTOMY  11/30/2019   Procedure: POLYPECTOMY;  Surgeon: Lin Landsman, MD;  Location: [place];  Service: Endoscopy;;   TOTAL HIP ARTHROPLASTY Right 08/02/2017   Procedure: TOTAL HIP ARTHROPLASTY;  Surgeon: Dereck Leep, MD;  Location: [facility];  Service: Orthopedics;  Laterality: Right;       Current Medications, Allergies, Past Medical History, Past Surgical History, Family History and Social History were reviewed in [employer] record.   Review of Systems:   Constitutional: Negative for fever, sweats, chills or weight loss.  Respiratory: Negative for shortness of breath.   Cardiovascular: Negative for chest pain, palpitations and leg swelling.  Gastrointestinal: See HPI.  Musculoskeletal: Negative for back pain or muscle aches.  Neurological: Negative for dizziness, headaches or paresthesias.    Physical Exam: BP 130/86   Pulse 65   Ht 6' (1.829 m)   Wt 216 lb 8 oz (98.2 kg)   BMI 29.36 kg/m   General: Well developed, w   ***male in no acute distress. Head: Normocephalic and  atraumatic. Eyes: No scleral icterus. Conjunctiva pink . Ears: Normal auditory acuity. Mouth: Dentition intact. No ulcers or lesions.  Lungs: Clear throughout to auscultation. Heart: Regular rate and rhythm, no murmur. Abdomen: Soft, nontender and nondistended. No masses or hepatomegaly. Normal bowel sounds x 4 quadrants.  Rectal: Deferred.  Musculoskeletal: Symmetrical with no gross deformities. Extremities: No edema. Neurological: Alert oriented x 4. No focal deficits.  Psychological: Alert and cooperative. Normal mood and affect  Assessment and Recommendations:  GERD, H. Pylori gastritis treated. Post treatment H. Pylori stool antigen was negative.   Colon polyps. First degree relative (father) with history of colon cancer.

## 2022-01-16 NOTE — Patient Instructions (Addendum)
Your provider has requested that you go to the basement level for lab work before leaving today. Press "B" on the elevator. The lab is located at the first door on the left as you exit the elevator.   We have sent the following medications to your pharmacy for you to pick up at your convenience: Carafate 1 g & Pantoprazole 40 mg   No Ibuprofen.  No alcohol.  Due to recent changes in healthcare laws, you may see the results of your imaging and laboratory studies on MyChart before your provider has had a chance to review them.  We understand that in some cases there may be results that are confusing or concerning to you. Not all laboratory results come back in the same time frame and the provider may be waiting for multiple results in order to interpret others.  Please give [place] 48 hours in order for your provider to thoroughly review all the results before contacting the office for clarification of your results.    Thank you for trusting me with your gastrointestinal care!   Carl Best, CRNP

## 2022-01-18 NOTE — Progress Notes (Signed)
Agree with the assessment and plan as outlined by Carl Best, NP.  As mentioned in  NP Kennedy-Smith's note, due to the patient's family history of colon cancer (father, 2s), he should repeat a colonoscopy in 5 years, not 7.  Will update his recall date.  Would also consider fecal elastase as further evaluation for his abdominal pain, if not improving.   Evan Mccaster E. Candis Schatz, MD [facility]

## 2022-01-19 NOTE — Telephone Encounter (Signed)
This was handled in another phone message. Charisse Wendell Zimmerman Rumple, CMA

## 2022-01-21 ENCOUNTER — Other Ambulatory Visit: Payer: Self-pay | Admitting: Nurse Practitioner

## 2022-01-21 DIAGNOSIS — I10 Essential (primary) hypertension: Secondary | ICD-10-CM

## 2022-02-04 ENCOUNTER — Telehealth: Payer: Self-pay | Admitting: *Deleted

## 2022-02-04 ENCOUNTER — Inpatient Hospital Stay: Payer: BC Managed Care – PPO | Attending: Oncology

## 2022-02-04 NOTE — Telephone Encounter (Signed)
Pt left VM asking about some suggestions for the pain he is having off and on in middle of chest above stomach, wanting to know if there is something else he can take.  Wanted to see what he is currently taking and also to see how many times and when he is experiencing this kind of episode. Will route to provider to see if there is any suggestions she may have for him when he calls back. Zaahir Pickney Zimmerman Rumple, CMA .

## 2022-02-05 ENCOUNTER — Telehealth: Payer: Self-pay | Admitting: Nurse Practitioner

## 2022-02-05 NOTE — Telephone Encounter (Signed)
Patient was advised to follow up with GI ASAP per the instruction of Gilbert, McPherson. Patient verbalized understanding and has agreed to contact GI. All questions and concerns have been addressed.

## 2022-02-05 NOTE — Telephone Encounter (Signed)
PT is still having pain in stomach following appt on 12/15. Wants to know of any options for relief. Please advise.

## 2022-02-05 NOTE — Telephone Encounter (Signed)
Pt stated that he was in pain last week but yesterday the pain resolved and today the pain is better: Pt notified to call our office back if pain returns: Pt verbalized understanding with all questions answered.

## 2022-02-05 NOTE — Telephone Encounter (Signed)
He has history of peptic ulcers and I have a feeling that is the pain he is experiencing. I believe he needs to see his GI provider. He and I spoke about this. It is imperative that he see them. ASAP.

## 2022-02-06 ENCOUNTER — Inpatient Hospital Stay: Payer: BC Managed Care – PPO | Admitting: Oncology

## 2022-02-19 ENCOUNTER — Other Ambulatory Visit: Payer: Self-pay | Admitting: Nurse Practitioner

## 2022-02-19 DIAGNOSIS — E781 Pure hyperglyceridemia: Secondary | ICD-10-CM

## 2022-03-14 ENCOUNTER — Other Ambulatory Visit: Payer: Self-pay | Admitting: Nurse Practitioner

## 2022-03-14 DIAGNOSIS — E781 Pure hyperglyceridemia: Secondary | ICD-10-CM

## 2022-03-16 ENCOUNTER — Encounter: Payer: BC Managed Care – PPO | Admitting: Physician Assistant

## 2022-03-18 ENCOUNTER — Other Ambulatory Visit: Payer: Self-pay | Admitting: Nurse Practitioner

## 2022-03-18 DIAGNOSIS — E781 Pure hyperglyceridemia: Secondary | ICD-10-CM

## 2022-03-25 ENCOUNTER — Encounter: Payer: Self-pay | Admitting: Nurse Practitioner

## 2022-03-25 ENCOUNTER — Ambulatory Visit: Payer: BC Managed Care – PPO | Admitting: Nurse Practitioner

## 2022-03-25 VITALS — BP 125/79 | HR 67 | Ht 72.0 in | Wt 211.1 lb

## 2022-03-25 DIAGNOSIS — M170 Bilateral primary osteoarthritis of knee: Secondary | ICD-10-CM | POA: Diagnosis not present

## 2022-03-25 DIAGNOSIS — E781 Pure hyperglyceridemia: Secondary | ICD-10-CM | POA: Diagnosis not present

## 2022-03-25 DIAGNOSIS — E1129 Type 2 diabetes mellitus with other diabetic kidney complication: Secondary | ICD-10-CM

## 2022-03-25 DIAGNOSIS — K2100 Gastro-esophageal reflux disease with esophagitis, without bleeding: Secondary | ICD-10-CM

## 2022-03-25 DIAGNOSIS — R809 Proteinuria, unspecified: Secondary | ICD-10-CM

## 2022-03-25 DIAGNOSIS — E119 Type 2 diabetes mellitus without complications: Secondary | ICD-10-CM

## 2022-03-25 DIAGNOSIS — K861 Other chronic pancreatitis: Secondary | ICD-10-CM

## 2022-03-25 DIAGNOSIS — M87051 Idiopathic aseptic necrosis of right femur: Secondary | ICD-10-CM

## 2022-03-25 DIAGNOSIS — K219 Gastro-esophageal reflux disease without esophagitis: Secondary | ICD-10-CM | POA: Diagnosis not present

## 2022-03-25 LAB — POCT UA - MICROALBUMIN
Albumin/Creatinine Ratio, Urine, POC: 300
Creatinine, POC: 200 mg/dL
Microalbumin Ur, POC: 150 mg/L

## 2022-03-25 MED ORDER — FENOFIBRATE 48 MG PO TABS: 48.0000 mg | ORAL_TABLET | Freq: Every day | ORAL | 1 refills | Status: DC

## 2022-03-25 NOTE — Progress Notes (Signed)
Established patient visit   Patient: Evan Sullivan   DOB: August 16, 1970   52 y.o. Male  MRN: [id number] Visit Date: 03/25/2022   Chief Complaint  Patient presents with   Medical Management of Chronic Issues   Subjective    HPI  Follow up  -severe GERD --currently stable.  --has established with GI provider  --is to be taking pantoprazole and carafate as needed.   Has brought FMLA paperwork again.  -was not filled out correctly  -provider sections were not included in original paperwork provided.   -would like to have 90 day prescription of cholesterol medication  -He denies chest pain, chest pressure, or shortness of breath. He denies headaches or visual disturbances. He denies abdominal pain, nausea, vomiting, or changes in bowel or bladder habits.     Medications: Outpatient Medications Prior to Visit  Medication Sig   colchicine 0.6 MG tablet Take 1 capsule by mouth daily as needed (gout pain)   hydrocortisone cream 1 % Apply 1 application  topically daily as needed (rash).   pantoprazole (PROTONIX) 40 MG tablet Take 1 tablet (40 mg total) by mouth daily as needed (acid reflux).   pantoprazole (PROTONIX) 40 MG tablet Take 1 tablet (40 mg total) by mouth daily. Take 30 minutes before breakfast.   [DISCONTINUED] amLODipine (NORVASC) 10 MG tablet TAKE 1 TABLET BY MOUTH EVERY DAY   [DISCONTINUED] atenolol (TENORMIN) 100 MG tablet TAKE 1 TABLET BY MOUTH EVERY DAY   [DISCONTINUED] fenofibrate (TRICOR) 48 MG tablet TAKE 1 TABLET BY MOUTH EVERY DAY   [DISCONTINUED] sucralfate (CARAFATE) 1 g tablet Take 1 tablet (1 g total) by mouth at bedtime.   No facility-administered medications prior to visit.    Review of Systems See HPI     Last CBC Lab Results  Component Value Date   WBC 5.1 01/16/2022   HGB 15.0 01/16/2022   HCT 43.5 01/16/2022   MCV 93.6 01/16/2022   MCH 28.2 07/30/2021   RDW 12.6 01/16/2022   PLT 324.0 [id number]   Last metabolic panel Lab Results   Component Value Date   GLUCOSE 110 (H) 03/30/2022   NA 139 03/30/2022   K 4.5 03/30/2022   CL 102 03/30/2022   CO2 22 03/30/2022   BUN 13 03/30/2022   CREATININE 1.00 03/30/2022   EGFR 92 03/30/2022   CALCIUM 9.8 03/30/2022   PROT 7.1 03/30/2022   ALBUMIN 4.5 03/30/2022   LABGLOB 2.6 03/30/2022   AGRATIO 1.7 03/30/2022   BILITOT <0.2 03/30/2022   ALKPHOS 70 03/30/2022   AST 13 03/30/2022   ALT 12 03/30/2022   ANIONGAP 8 05/16/2021   Last lipids Lab Results  Component Value Date   CHOL 160 05/14/2021   HDL 47 05/14/2021   LDLCALC 82 05/14/2021   TRIG 156 (H) 05/14/2021   CHOLHDL 3.4 05/14/2021   Last hemoglobin A1c Lab Results  Component Value Date   HGBA1C 7.0 (H) 03/30/2022   Last thyroid functions Lab Results  Component Value Date   TSH 2.004 07/30/2021       Objective     Today's Vitals   03/25/22 1355  BP: 125/79  Pulse: 67  SpO2: 99%  Weight: 211 lb 1.9 oz (95.8 kg)  Height: 6' (1.829 m)   Body mass index is 28.63 kg/m.  BP Readings from Last 3 Encounters:  03/25/22 125/79  01/16/22 130/86  12/23/21 (Abnormal) 149/90    Wt Readings from Last 3 Encounters:  03/25/22 211 lb 1.9 oz (95.8 kg)  01/16/22 216 lb 8 oz (98.2 kg)  12/23/21 224 lb (101.6 kg)    Physical Exam Vitals and nursing note reviewed.  Constitutional:      Appearance: Normal appearance. He is well-developed.  HENT:     Head: Normocephalic and atraumatic.     Nose: Nose normal.     Mouth/Throat:     Mouth: Mucous membranes are moist.     Pharynx: Oropharynx is clear.  Eyes:     Extraocular Movements: Extraocular movements intact.     Conjunctiva/sclera: Conjunctivae normal.     Pupils: Pupils are equal, round, and reactive to light.  Neck:     Vascular: No carotid bruit.  Cardiovascular:     Rate and Rhythm: Normal rate and regular rhythm.     Pulses: Normal pulses.     Heart sounds: Normal heart sounds.  Pulmonary:     Effort: Pulmonary effort is normal.      Breath sounds: Normal breath sounds.  Abdominal:     Palpations: Abdomen is soft.  Musculoskeletal:        General: Normal range of motion.     Cervical back: Normal range of motion and neck supple.  Lymphadenopathy:     Cervical: No cervical adenopathy.  Skin:    General: Skin is warm and dry.     Capillary Refill: Capillary refill takes less than 2 seconds.  Neurological:     General: No focal deficit present.     Mental Status: He is alert and oriented to person, place, and time.  Psychiatric:        Mood and Affect: Mood normal.        Behavior: Behavior normal.        Thought Content: Thought content normal.        Judgment: Judgment normal.      Results for orders placed or performed in visit on 03/25/22  Hemoglobin A1c  Result Value Ref Range   Hgb A1c MFr Bld 7.0 (H) 4.8 - 5.6 %   Est. average glucose Bld gHb Est-mCnc 154 mg/dL  Comp Met (CMET)  Result Value Ref Range   Glucose 110 (H) 70 - 99 mg/dL   BUN 13 6 - 24 mg/dL   Creatinine, Ser 1.00 0.76 - 1.27 mg/dL   eGFR 92 >59 mL/min/1.73   BUN/Creatinine Ratio 13 9 - 20   Sodium 139 134 - 144 mmol/L   Potassium 4.5 3.5 - 5.2 mmol/L   Chloride 102 96 - 106 mmol/L   CO2 22 20 - 29 mmol/L   Calcium 9.8 8.7 - 10.2 mg/dL   Total Protein 7.1 6.0 - 8.5 g/dL   Albumin 4.5 4.1 - 5.1 g/dL   Globulin, Total 2.6 1.5 - 4.5 g/dL   Albumin/Globulin Ratio 1.7 1.2 - 2.2   Bilirubin Total <0.2 0.0 - 1.2 mg/dL   Alkaline Phosphatase 70 44 - 121 IU/L   AST 13 0 - 40 IU/L   ALT 12 0 - 44 IU/L  POCT UA - Microalbumin  Result Value Ref Range   Microalbumin Ur, POC 150 mg/L   Creatinine, POC 200 mg/dL   Albumin/Creatinine Ratio, Urine, POC 300     Assessment & Plan    Diet-controlled type 2 diabetes mellitus (HCC) Assessment & Plan: Urine microalbumin slightly abnormal. `check HgbA1c along with CMP and CBC, written order given to patient. Advised him to ocnitnue with limiting carbohydrates and sugar and drinking plenty of  water. Adjust/add medication as indicated   Orders: -  POCT UA - Microalbumin  Pure hypertriglyceridemia Assessment & Plan: Currently on fenofibrate. Continue as previously prescribed   Orders: -     Fenofibrate; Take 1 tablet (48 mg total) by mouth daily.  Dispense: 90 tablet; Refill: 1  Gastroesophageal reflux disease without esophagitis Assessment & Plan: Currently stable. Established with Gi. Continue pantoprazole and sucralfate as previously prescribed    Primary osteoarthritis of both knees Assessment & Plan: Take tylenol as needed and as indicated for pain. FMLA paperwork to be dompleted and returned to patient as soon as complete.    Type 2 diabetes mellitus with proteinuria (HCC) -     Comprehensive metabolic panel; Future -     Hemoglobin A1c; Future  Gastroesophageal reflux disease with esophagitis without hemorrhage    Problem List Items Addressed This Visit       Digestive   Gastroesophageal reflux disease without esophagitis    Currently stable. Established with Gi. Continue pantoprazole and sucralfate as previously prescribed       Gastroesophageal reflux disease with esophagitis     Endocrine   Diet-controlled type 2 diabetes mellitus ([place]) - Primary    Urine microalbumin slightly abnormal. `check HgbA1c along with CMP and CBC, written order given to patient. Advised him to ocnitnue with limiting carbohydrates and sugar and drinking plenty of water. Adjust/add medication as indicated       Relevant Orders   POCT UA - Microalbumin (Completed)     Musculoskeletal and Integument   Primary osteoarthritis of both knees    Take tylenol as needed and as indicated for pain. FMLA paperwork to be dompleted and returned to patient as soon as complete.         Other   HLD (hyperlipidemia)    Currently on fenofibrate. Continue as previously prescribed       Relevant Medications   fenofibrate (TRICOR) 48 MG tablet   Other Visit Diagnoses     Type 2  diabetes mellitus with proteinuria ([place])       Relevant Orders   Comp Met (CMET) (Completed)   Hemoglobin A1c (Completed)        Return in about 4 months (around 07/24/2022) for health maintenance exam, check HgbA1c, FBW at time of visit.         [name], NP  [facility] at [facility] [phone] (phone) [phone] (fax)  [place]

## 2022-03-30 ENCOUNTER — Encounter: Payer: BC Managed Care – PPO | Admitting: Physician Assistant

## 2022-03-30 DIAGNOSIS — R809 Proteinuria, unspecified: Secondary | ICD-10-CM | POA: Diagnosis not present

## 2022-03-30 DIAGNOSIS — E1129 Type 2 diabetes mellitus with other diabetic kidney complication: Secondary | ICD-10-CM | POA: Diagnosis not present

## 2022-03-31 LAB — COMPREHENSIVE METABOLIC PANEL WITH GFR
ALT: 12 IU/L (ref 0–44)
AST: 13 IU/L (ref 0–40)
Albumin/Globulin Ratio: 1.7 (ref 1.2–2.2)
Albumin: 4.5 g/dL (ref 4.1–5.1)
Alkaline Phosphatase: 70 IU/L (ref 44–121)
BUN/Creatinine Ratio: 13 (ref 9–20)
BUN: 13 mg/dL (ref 6–24)
Bilirubin Total: <0.2 mg/dL (ref 0.0–1.2)
CO2: 22 mmol/L (ref 20–29)
Calcium: 9.8 mg/dL (ref 8.7–10.2)
Chloride: 102 mmol/L (ref 96–106)
Creatinine, Ser: 1 mg/dL (ref 0.76–1.27)
Globulin, Total: 2.6 g/dL (ref 1.5–4.5)
Glucose: 110 mg/dL — ABNORMAL HIGH (ref 70–99)
Potassium: 4.5 mmol/L (ref 3.5–5.2)
Sodium: 139 mmol/L (ref 134–144)
Total Protein: 7.1 g/dL (ref 6.0–8.5)
eGFR: 92 mL/min/1.73

## 2022-03-31 LAB — HEMOGLOBIN A1C
Est. average glucose Bld gHb Est-mCnc: 154 mg/dL
Hgb A1c MFr Bld: 7 % — ABNORMAL HIGH (ref 4.8–5.6)

## 2022-04-09 DIAGNOSIS — D1730 Benign lipomatous neoplasm of skin and subcutaneous tissue of unspecified sites: Secondary | ICD-10-CM | POA: Diagnosis not present

## 2022-04-09 LAB — HM DIABETES EYE EXAM

## 2022-04-19 ENCOUNTER — Other Ambulatory Visit: Payer: Self-pay | Admitting: Nurse Practitioner

## 2022-04-19 DIAGNOSIS — I10 Essential (primary) hypertension: Secondary | ICD-10-CM

## 2022-04-25 NOTE — Assessment & Plan Note (Signed)
Take tylenol as needed and as indicated for pain. FMLA paperwork to be dompleted and returned to patient as soon as complete.

## 2022-04-25 NOTE — Assessment & Plan Note (Signed)
>>  ASSESSMENT AND PLAN FOR HLD (HYPERLIPIDEMIA) WRITTEN ON 04/25/2022  7:45 PM BY BOSCIA, HEATHER E, NP  Currently on fenofibrate . Continue as previously prescribed

## 2022-04-25 NOTE — Assessment & Plan Note (Signed)
Currently on fenofibrate. Continue as previously prescribed

## 2022-04-25 NOTE — Assessment & Plan Note (Deleted)
Currently stable. Established with Gi. Continue pantoprazole and sucralfate as previously prescribed

## 2022-04-25 NOTE — Assessment & Plan Note (Signed)
Currently stable. Established with Gi. Continue pantoprazole and sucralfate as previously prescribed

## 2022-04-25 NOTE — Assessment & Plan Note (Signed)
Urine microalbumin slightly abnormal. `check HgbA1c along with CMP and CBC, written order given to patient. Advised him to ocnitnue with limiting carbohydrates and sugar and drinking plenty of water. Adjust/add medication as indicated

## 2022-07-09 ENCOUNTER — Encounter: Payer: Self-pay | Admitting: Nurse Practitioner

## 2022-07-09 ENCOUNTER — Ambulatory Visit: Payer: BC Managed Care – PPO | Admitting: Nurse Practitioner

## 2022-07-09 VITALS — BP 147/97 | HR 64 | Ht 72.0 in | Wt 203.4 lb

## 2022-07-09 DIAGNOSIS — N390 Urinary tract infection, site not specified: Secondary | ICD-10-CM | POA: Insufficient documentation

## 2022-07-09 DIAGNOSIS — R319 Hematuria, unspecified: Secondary | ICD-10-CM

## 2022-07-09 LAB — POCT URINALYSIS DIP (CLINITEK)
Bilirubin, UA: NEGATIVE
Glucose, UA: NEGATIVE mg/dL
Ketones, POC UA: NEGATIVE mg/dL
Nitrite, UA: NEGATIVE
POC PROTEIN,UA: 100 — AB
Spec Grav, UA: 1.025 (ref 1.010–1.025)
Urobilinogen, UA: 4 E.U./dL — AB
pH, UA: 6 (ref 5.0–8.0)

## 2022-07-09 MED ORDER — SULFAMETHOXAZOLE-TRIMETHOPRIM 800-160 MG PO TABS: 1.0000 | ORAL_TABLET | Freq: Two times a day (BID) | ORAL | 0 refills | Status: DC

## 2022-07-09 NOTE — Assessment & Plan Note (Signed)
Start bactrim DS twice daily for 5 days  -send urine for culture and sensitivity and adjust antibiotics as indicated

## 2022-07-09 NOTE — Progress Notes (Signed)
Established patient visit   Patient: Evan Sullivan   DOB: 20-May-1970   52 y.o. Male  MRN: [id number] Visit Date: 07/09/2022  Chief Complaint  Patient presents with   Urinary Tract Infection   Subjective    The patient states started having abdominal/flank pain on Monday -had a few episodes of blood in urine on Monday and Tuesday.  -states that he has not noted this today -does have some dysuria. This is mostly at the end of the urine stream.  -also some urinary frequency with some hesitancy.  -he denies nausea or vomiting  -did have some constipation   Urinary Tract Infection  This is a new problem. The current episode started in the past 7 days. The problem occurs intermittently. The problem has been waxing and waning. The quality of the pain is described as burning. The pain is mild. There has been no fever. He is Sexually active. There is No history of pyelonephritis. Associated symptoms include flank pain, frequency, hematuria, hesitancy and urgency. Pertinent negatives include no chills, discharge, nausea, possible pregnancy, sweats or vomiting. He has tried nothing for the symptoms.     Medications: Outpatient Medications Prior to Visit  Medication Sig   amLODipine (NORVASC) 10 MG tablet TAKE 1 TABLET BY MOUTH EVERY DAY   atenolol (TENORMIN) 100 MG tablet TAKE 1 TABLET BY MOUTH EVERY DAY   colchicine 0.6 MG tablet Take 1 capsule by mouth daily as needed (gout pain)   fenofibrate (TRICOR) 48 MG tablet Take 1 tablet (48 mg total) by mouth daily.   hydrocortisone cream 1 % Apply 1 application  topically daily as needed (rash).   pantoprazole (PROTONIX) 40 MG tablet Take 1 tablet (40 mg total) by mouth daily as needed (acid reflux).   pantoprazole (PROTONIX) 40 MG tablet Take 1 tablet (40 mg total) by mouth daily. Take 30 minutes before breakfast.   No facility-administered medications prior to visit.    Review of Systems  Constitutional:  Negative for chills.   Gastrointestinal:  Negative for nausea and vomiting.  Genitourinary:  Positive for flank pain, frequency, hematuria, hesitancy and urgency.     Objective     Today's Vitals   07/09/22 1625  BP: (Abnormal) 147/97  Pulse: 64  SpO2: 99%  Weight: 203 lb 6.4 oz (92.3 kg)  Height: 6' (1.829 m)   Body mass index is 27.59 kg/m.   Physical Exam Vitals and nursing note reviewed.  Constitutional:      Appearance: Normal appearance. He is well-developed.  HENT:     Head: Normocephalic and atraumatic.     Nose: Nose normal.     Mouth/Throat:     Mouth: Mucous membranes are moist.     Pharynx: Oropharynx is clear.  Eyes:     Extraocular Movements: Extraocular movements intact.     Conjunctiva/sclera: Conjunctivae normal.     Pupils: Pupils are equal, round, and reactive to light.  Neck:     Vascular: No carotid bruit.  Cardiovascular:     Rate and Rhythm: Normal rate and regular rhythm.     Pulses: Normal pulses.     Heart sounds: Normal heart sounds.  Pulmonary:     Effort: Pulmonary effort is normal.     Breath sounds: Normal breath sounds.  Abdominal:     Palpations: Abdomen is soft.  Genitourinary:    Comments: Urine sample is positive for small WBC and trace blood  Musculoskeletal:        General: Normal range of  motion.     Cervical back: Normal range of motion and neck supple.  Lymphadenopathy:     Cervical: No cervical adenopathy.  Skin:    General: Skin is warm and dry.     Capillary Refill: Capillary refill takes less than 2 seconds.  Neurological:     General: No focal deficit present.     Mental Status: He is alert and oriented to person, place, and time.  Psychiatric:        Mood and Affect: Mood normal.        Behavior: Behavior normal.        Thought Content: Thought content normal.        Judgment: Judgment normal.      Results for orders placed or performed in visit on 07/09/22  POCT URINALYSIS DIP (CLINITEK)  Result Value Ref Range   Color, UA  yellow yellow   Clarity, UA clear clear   Glucose, UA negative negative mg/dL   Bilirubin, UA negative negative   Ketones, POC UA negative negative mg/dL   Spec Grav, UA 1.610 9.604 - 1.025   Blood, UA trace-intact (A) negative   pH, UA 6.0 5.0 - 8.0   POC PROTEIN,UA =100 (A) negative, trace   Urobilinogen, UA 4.0 (A) 0.2 or 1.0 E.U./dL   Nitrite, UA Negative Negative   Leukocytes, UA Trace (A) Negative    Assessment & Plan     Urinary tract infection with hematuria, site unspecified Assessment & Plan: Start bactrim DS twice daily for 5 days  -send urine for culture and sensitivity and adjust antibiotics as indicated   Orders: -     POCT URINALYSIS DIP (CLINITEK) -     Urine Culture; Future -     Sulfamethoxazole-Trimethoprim; Take 1 tablet by mouth 2 (two) times daily.  Dispense: 10 tablet; Refill: 0     Return for as scheduled.        Carlean Jews, NP  [facility] at [facility] [phone] (phone) [phone] (fax)  [facility]

## 2022-07-14 LAB — URINE CULTURE

## 2022-07-28 ENCOUNTER — Ambulatory Visit (INDEPENDENT_AMBULATORY_CARE_PROVIDER_SITE_OTHER): Payer: BC Managed Care – PPO | Admitting: Nurse Practitioner

## 2022-07-28 ENCOUNTER — Encounter: Payer: Self-pay | Admitting: Nurse Practitioner

## 2022-07-28 VITALS — BP 144/95 | HR 61 | Ht 72.0 in | Wt 196.4 lb

## 2022-07-28 DIAGNOSIS — E782 Mixed hyperlipidemia: Secondary | ICD-10-CM

## 2022-07-28 DIAGNOSIS — I10 Essential (primary) hypertension: Secondary | ICD-10-CM

## 2022-07-28 DIAGNOSIS — R5383 Other fatigue: Secondary | ICD-10-CM | POA: Diagnosis not present

## 2022-07-28 DIAGNOSIS — E781 Pure hyperglyceridemia: Secondary | ICD-10-CM

## 2022-07-28 DIAGNOSIS — Z0001 Encounter for general adult medical examination with abnormal findings: Secondary | ICD-10-CM

## 2022-07-28 DIAGNOSIS — Z125 Encounter for screening for malignant neoplasm of prostate: Secondary | ICD-10-CM

## 2022-07-28 DIAGNOSIS — Z0000 Encounter for general adult medical examination without abnormal findings: Secondary | ICD-10-CM

## 2022-07-28 DIAGNOSIS — E119 Type 2 diabetes mellitus without complications: Secondary | ICD-10-CM

## 2022-07-28 NOTE — Progress Notes (Signed)
Complete physical exam   Patient: Evan Sullivan   DOB: 1970/08/14   52 y.o. Male  MRN: [id number] Visit Date: 07/28/2022    Chief Complaint  Patient presents with   Annual Exam   Subjective    Evan Sullivan is a 52 y.o. male who presents today for a complete physical exam.  He reports consuming a diabetic, 2000 calorie diet. States that he has noted certain foods really bother his stomach.  The patient does not participate in regular exercise at present. He generally feels well. He does not have additional problems to discuss today.   HPI  Annual physical  -HTN --generally well managed  -BP elevated today as he ran out of atenolol on Sunday. States it is already ready for him to pick up.  -HLD --due to have check of fasting lipid panel  -GERD/peptic ulcer disease --has been doing very well. States that he has not needed to take pantoprazole In some time.  -diet controlled DM2  --due for check HgbA1c  -due to have routine, fasting labs today   .-He denies chest pain, chest pressure, or shortness of breath. He denies headaches or visual disturbances. He denies abdominal pain, nausea, vomiting, or changes in bowel or bladder habits.     Past Medical History:  Diagnosis Date   Anemia    Arthritis    Avascular necrosis of femoral head, right (HCC) 2019   Diet-controlled type 2 diabetes mellitus (HCC) 09/07/2021   GERD (gastroesophageal reflux disease)    Gout    Hypertension    Peptic ulcer disease    Past Surgical History:  Procedure Laterality Date   COLONOSCOPY     COLONOSCOPY WITH PROPOFOL N/A 11/30/2019   Procedure: COLONOSCOPY WITH PROPOFOL;  Surgeon: Toney Reil, MD;  Location: [facility];  Service: Endoscopy;  Laterality: N/A;  priority 4   KNEE ARTHROSCOPY, MEDIAL PATELLO FEMORAL LIGAMENT REPAIR Bilateral 2004,2000   POLYPECTOMY  11/30/2019   Procedure: POLYPECTOMY;  Surgeon: Toney Reil, MD;  Location: [facility];  Service:  Endoscopy;;   TOTAL HIP ARTHROPLASTY Right 08/02/2017   Procedure: TOTAL HIP ARTHROPLASTY;  Surgeon: Donato Heinz, MD;  Location: [facility];  Service: Orthopedics;  Laterality: Right;   Social History   Socioeconomic History   Marital status: Single    Spouse name: Not on file   Number of children: Not on file   Years of education: Not on file   Highest education level: Not on file  Occupational History   Not on file  Tobacco Use   Smoking status: Never   Smokeless tobacco: Never  Vaping Use   Vaping Use: Never used  Substance and Sexual Activity   Alcohol use: Not Currently   Drug use: Yes    Types: Marijuana    Comment: daily    Sexual activity: Yes    Birth control/protection: Condom  Other Topics Concern   Not on file  Social History Narrative   Not on file   Social Determinants of Health   Financial Resource Strain: Not on file  Food Insecurity: Not on file  Transportation Needs: Not on file  Physical Activity: Not on file  Stress: Not on file  Social Connections: Not on file  Intimate Partner Violence: Not on file   Family Status  Relation Name Status   Mother  Alive   Father  Deceased   Neg Hx  (Not Specified)   Family History  Problem Relation Age of Onset  Hypertension Mother    Hypertension Father    Colon cancer Father 25   Diabetes Father    Pancreatic cancer Neg Hx    Esophageal cancer Neg Hx    Liver cancer Neg Hx    Stomach cancer Neg Hx    No Known Allergies  Patient Care Team: Carlean Jews, NP as PCP - General (Family Medicine) Creig Hines, MD as Consulting Physician (Oncology)   Medications: Outpatient Medications Prior to Visit  Medication Sig   amLODipine (NORVASC) 10 MG tablet TAKE 1 TABLET BY MOUTH EVERY DAY   atenolol (TENORMIN) 100 MG tablet TAKE 1 TABLET BY MOUTH EVERY DAY   colchicine 0.6 MG tablet Take 1 capsule by mouth daily as needed (gout pain)   fenofibrate (TRICOR) 48 MG tablet Take 1 tablet (48 mg total)  by mouth daily.   hydrocortisone cream 1 % Apply 1 application  topically daily as needed (rash).   pantoprazole (PROTONIX) 40 MG tablet Take 1 tablet (40 mg total) by mouth daily as needed (acid reflux).   pantoprazole (PROTONIX) 40 MG tablet Take 1 tablet (40 mg total) by mouth daily. Take 30 minutes before breakfast.   [DISCONTINUED] sulfamethoxazole-trimethoprim (BACTRIM DS) 800-160 MG tablet Take 1 tablet by mouth 2 (two) times daily.   No facility-administered medications prior to visit.    Review of Systems See HPI      Objective     Today's Vitals   07/28/22 0818 07/28/22 0848  BP: (Abnormal) 169/115 (Abnormal) 144/95  Pulse: 61   SpO2: 100%   Weight: 196 lb 6.4 oz (89.1 kg)   Height: 6' (1.829 m)    Body mass index is 26.64 kg/m.  BP Readings from Last 3 Encounters:  07/28/22 (Abnormal) 144/95  07/09/22 (Abnormal) 147/97  03/25/22 125/79    Wt Readings from Last 3 Encounters:  07/28/22 196 lb 6.4 oz (89.1 kg)  07/09/22 203 lb 6.4 oz (92.3 kg)  03/25/22 211 lb 1.9 oz (95.8 kg)     Physical Exam Vitals and nursing note reviewed.  Constitutional:      Appearance: Normal appearance. He is well-developed.  HENT:     Head: Normocephalic and atraumatic.     Right Ear: Tympanic membrane, ear canal and external ear normal.     Left Ear: Tympanic membrane, ear canal and external ear normal.     Nose: Nose normal.     Mouth/Throat:     Mouth: Mucous membranes are moist.     Pharynx: Oropharynx is clear.  Eyes:     Extraocular Movements: Extraocular movements intact.     Conjunctiva/sclera: Conjunctivae normal.     Pupils: Pupils are equal, round, and reactive to light.  Neck:     Vascular: No carotid bruit.  Cardiovascular:     Rate and Rhythm: Normal rate and regular rhythm.     Pulses: Normal pulses.     Heart sounds: Normal heart sounds.  Pulmonary:     Effort: Pulmonary effort is normal.     Breath sounds: Normal breath sounds.  Abdominal:      General: Bowel sounds are normal. There is no distension.     Palpations: Abdomen is soft. There is no mass.     Tenderness: There is no abdominal tenderness. There is no right CVA tenderness, left CVA tenderness, guarding or rebound.     Hernia: No hernia is present.  Musculoskeletal:        General: Normal range of motion.  Cervical back: Normal range of motion and neck supple.  Lymphadenopathy:     Cervical: No cervical adenopathy.  Skin:    General: Skin is warm and dry.     Capillary Refill: Capillary refill takes less than 2 seconds.  Neurological:     General: No focal deficit present.     Mental Status: He is alert and oriented to person, place, and time.  Psychiatric:        Mood and Affect: Mood normal.        Behavior: Behavior normal.        Thought Content: Thought content normal.        Judgment: Judgment normal.      Last depression screening scores   Row Labels 07/28/2022    8:24 AM 03/25/2022    2:32 PM 12/23/2021    3:28 PM  PHQ 2/9 Scores   Section Header. No data exists in this row.     PHQ - 2 Score   0 0 0  PHQ- 9 Score    0 0   Last fall risk screening   Row Labels 03/25/2022    2:32 PM  Fall Risk    Section Header. No data exists in this row.   Falls in the past year?   0  Number falls in past yr:   0  Injury with Fall?   0  Follow up   Falls evaluation completed   Last Audit-C alcohol use screening   Row Labels 03/24/2021    2:44 PM  Alcohol Use Disorder Test (AUDIT)   Section Header. No data exists in this row.   1. How often do you have a drink containing alcohol?   3  2. How many drinks containing alcohol do you have on a typical day when you are drinking?   1  3. How often do you have six or more drinks on one occasion?   0  AUDIT-C Score   4   A score of 3 or more in women, and 4 or more in men indicates increased risk for alcohol abuse, EXCEPT if all of the points are from question 1    Assessment & Plan    Encounter for general  adult medical examination with abnormal findings  Type 2 diabetes, diet controlled (HCC) Assessment & Plan: Check HgbA1c with labs today  -treat as indicated   Orders: -     CBC with Differential/Platelet; Future -     Hemoglobin A1c; Future -     TSH + free T4; Future  Essential (primary) hypertension Assessment & Plan: Blood pressure elevated today as patient is out of one of his medications. Will pick it up following this visit.  -generally stable  -Continue current medication.  -recommend he follow a DASH diet. Increase water intake.  -reassess in 3 to 4 months.   Orders: -     CBC with Differential/Platelet; Future -     Lipid panel; Future  Mixed hyperlipidemia Assessment & Plan: Check fasting lipids with today's labs -adjust medication dosing as indicated   Orders: -     Comprehensive metabolic panel; Future -     Lipid panel; Future  Other fatigue -     Hemoglobin A1c; Future -     TSH + free T4; Future  Screening for prostate cancer -     PSA; Future      Immunization History  Administered Date(s) Administered   PFIZER(Purple Top)SARS-COV-2 Vaccination 11/13/2019, 01/03/2020  Health Maintenance  Topic Date Due   OPHTHALMOLOGY EXAM  Never done   Hepatitis C Screening  Never done   DTaP/Tdap/Td (1 - Tdap) Never done   Zoster Vaccines- Shingrix (1 of 2) Never done   COVID-19 Vaccine (3 - 2023-24 season) 10/03/2021   HEMOGLOBIN A1C  09/28/2022   Diabetic kidney evaluation - Urine ACR  03/26/2023   Diabetic kidney evaluation - eGFR measurement  03/31/2023   FOOT EXAM  07/28/2023   Colonoscopy  07/16/2028   HIV Screening  Completed   HPV VACCINES  Aged Out   INFLUENZA VACCINE  Discontinued    Discussed health benefits of physical activity, and encouraged him to engage in regular exercise appropriate for his age and condition.    Return in about 4 months (around 11/27/2022) for diabetes with HgbA1c check.        Carlean Jews, NP   [facility] [phone] (phone) [phone] (fax)  [facility]

## 2022-07-28 NOTE — Assessment & Plan Note (Signed)
>>  ASSESSMENT AND PLAN FOR HLD (HYPERLIPIDEMIA) WRITTEN ON 07/28/2022 10:41 AM BY BOSCIA, HEATHER E, NP  Check fasting lipids with today's labs -adjust medication dosing as indicated

## 2022-07-28 NOTE — Assessment & Plan Note (Signed)
Check fasting lipids with today's labs -adjust medication dosing as indicated

## 2022-07-28 NOTE — Assessment & Plan Note (Signed)
Blood pressure elevated today as patient is out of one of his medications. Will pick it up following this visit.  -generally stable  -Continue current medication.  -recommend he follow a DASH diet. Increase water intake.  -reassess in 3 to 4 months.

## 2022-07-28 NOTE — Assessment & Plan Note (Signed)
Check HgbA1c with labs today  -treat as indicated

## 2022-07-29 LAB — CBC WITH DIFFERENTIAL/PLATELET
Basophils Absolute: 0.1 10*3/uL (ref 0.0–0.2)
Basos: 1 %
EOS (ABSOLUTE): 0.1 10*3/uL (ref 0.0–0.4)
Eos: 3 %
Hematocrit: 48.3 % (ref 37.5–51.0)
Hemoglobin: 15.8 g/dL (ref 13.0–17.7)
Immature Grans (Abs): 0 10*3/uL (ref 0.0–0.1)
Immature Granulocytes: 0 %
Lymphocytes Absolute: 1.7 10*3/uL (ref 0.7–3.1)
Lymphs: 40 %
MCH: 30 pg (ref 26.6–33.0)
MCHC: 32.7 g/dL (ref 31.5–35.7)
MCV: 92 fL (ref 79–97)
Monocytes Absolute: 0.3 10*3/uL (ref 0.1–0.9)
Monocytes: 8 %
Neutrophils Absolute: 2 10*3/uL (ref 1.4–7.0)
Neutrophils: 48 %
Platelets: 389 10*3/uL (ref 150–450)
RBC: 5.27 x10E6/uL (ref 4.14–5.80)
RDW: 12.5 % (ref 11.6–15.4)
WBC: 4.3 10*3/uL (ref 3.4–10.8)

## 2022-07-29 LAB — COMPREHENSIVE METABOLIC PANEL
ALT: 12 IU/L (ref 0–44)
AST: 17 IU/L (ref 0–40)
Albumin: 4.8 g/dL (ref 3.8–4.9)
Alkaline Phosphatase: 70 IU/L (ref 44–121)
BUN/Creatinine Ratio: 16 (ref 9–20)
BUN: 16 mg/dL (ref 6–24)
Bilirubin Total: 0.4 mg/dL (ref 0.0–1.2)
CO2: 18 mmol/L — ABNORMAL LOW (ref 20–29)
Calcium: 10.4 mg/dL — ABNORMAL HIGH (ref 8.7–10.2)
Chloride: 102 mmol/L (ref 96–106)
Creatinine, Ser: 0.97 mg/dL (ref 0.76–1.27)
Globulin, Total: 2.9 g/dL (ref 1.5–4.5)
Glucose: 124 mg/dL — ABNORMAL HIGH (ref 70–99)
Potassium: 4.5 mmol/L (ref 3.5–5.2)
Sodium: 138 mmol/L (ref 134–144)
Total Protein: 7.7 g/dL (ref 6.0–8.5)
eGFR: 95 mL/min/{1.73_m2} (ref >59)

## 2022-07-29 LAB — LIPID PANEL
Chol/HDL Ratio: 2.3 ratio (ref 0.0–5.0)
Cholesterol, Total: 190 mg/dL (ref 100–199)
HDL: 81 mg/dL (ref >39)
LDL Chol Calc (NIH): 88 mg/dL (ref 0–99)
Triglycerides: 119 mg/dL (ref 0–149)
VLDL Cholesterol Cal: 21 mg/dL (ref 5–40)

## 2022-07-29 LAB — HEMOGLOBIN A1C
Est. average glucose Bld gHb Est-mCnc: 146 mg/dL
Hgb A1c MFr Bld: 6.7 % — ABNORMAL HIGH (ref 4.8–5.6)

## 2022-07-29 LAB — PSA: Prostate Specific Ag, Serum: 1.3 ng/mL (ref 0.0–4.0)

## 2022-07-29 LAB — TSH+FREE T4
Free T4: 1.35 ng/dL (ref 0.82–1.77)
TSH: 1.68 u[IU]/mL (ref 0.450–4.500)

## 2022-08-03 NOTE — Progress Notes (Signed)
Please let patient know that labs are back.  His hemoglobin A1c is 6.7, down from 7.8 at last check.  Calcium level slightly elevated and we can recheck this with his next visit.  All other labs are good. Thanks  -HB

## 2022-09-21 ENCOUNTER — Emergency Department (HOSPITAL_COMMUNITY)
Admission: EM | Admit: 2022-09-21 | Discharge: 2022-09-21 | Disposition: A | Discharge status: Home / Self Care | Payer: BC Managed Care – PPO | Source: Home / Self Care | Attending: Emergency Medicine | Admitting: Emergency Medicine

## 2022-09-21 ENCOUNTER — Encounter (HOSPITAL_COMMUNITY): Payer: Self-pay

## 2022-09-21 ENCOUNTER — Other Ambulatory Visit: Payer: Self-pay

## 2022-09-21 ENCOUNTER — Emergency Department (HOSPITAL_COMMUNITY): Payer: BC Managed Care – PPO

## 2022-09-21 DIAGNOSIS — Z79899 Other long term (current) drug therapy: Secondary | ICD-10-CM | POA: Insufficient documentation

## 2022-09-21 DIAGNOSIS — E119 Type 2 diabetes mellitus without complications: Secondary | ICD-10-CM | POA: Insufficient documentation

## 2022-09-21 DIAGNOSIS — R1013 Epigastric pain: Secondary | ICD-10-CM | POA: Diagnosis not present

## 2022-09-21 DIAGNOSIS — R112 Nausea with vomiting, unspecified: Secondary | ICD-10-CM

## 2022-09-21 DIAGNOSIS — K8591 Acute pancreatitis with uninfected necrosis, unspecified: Secondary | ICD-10-CM | POA: Diagnosis not present

## 2022-09-21 DIAGNOSIS — I10 Essential (primary) hypertension: Secondary | ICD-10-CM | POA: Diagnosis not present

## 2022-09-21 DIAGNOSIS — E860 Dehydration: Secondary | ICD-10-CM | POA: Diagnosis not present

## 2022-09-21 DIAGNOSIS — R1010 Upper abdominal pain, unspecified: Secondary | ICD-10-CM | POA: Diagnosis not present

## 2022-09-21 LAB — COMPREHENSIVE METABOLIC PANEL
ALT: 13 U/L (ref 0–44)
AST: 21 U/L (ref 15–41)
Albumin: 4.5 g/dL (ref 3.5–5.0)
Alkaline Phosphatase: 55 U/L (ref 38–126)
Anion gap: 10 (ref 5–15)
BUN: 12 mg/dL (ref 6–20)
CO2: 21 mmol/L — ABNORMAL LOW (ref 22–32)
Calcium: 9.7 mg/dL (ref 8.9–10.3)
Chloride: 104 mmol/L (ref 98–111)
Creatinine, Ser: 0.96 mg/dL (ref 0.61–1.24)
GFR, Estimated: >60 mL/min (ref >60)
Glucose, Bld: 126 mg/dL — ABNORMAL HIGH (ref 70–99)
Potassium: 4.1 mmol/L (ref 3.5–5.1)
Sodium: 135 mmol/L (ref 135–145)
Total Bilirubin: 1 mg/dL (ref 0.3–1.2)
Total Protein: 8.3 g/dL — ABNORMAL HIGH (ref 6.5–8.1)

## 2022-09-21 LAB — CBC WITH DIFFERENTIAL/PLATELET
Abs Immature Granulocytes: 0.01 10*3/uL (ref 0.00–0.07)
Basophils Absolute: 0 10*3/uL (ref 0.0–0.1)
Basophils Relative: 1 %
Eosinophils Absolute: 0.1 10*3/uL (ref 0.0–0.5)
Eosinophils Relative: 1 %
HCT: 44.8 % (ref 39.0–52.0)
Hemoglobin: 15.3 g/dL (ref 13.0–17.0)
Immature Granulocytes: 0 %
Lymphocytes Relative: 19 %
Lymphs Abs: 1.1 10*3/uL (ref 0.7–4.0)
MCH: 31 pg (ref 26.0–34.0)
MCHC: 34.2 g/dL (ref 30.0–36.0)
MCV: 90.7 fL (ref 80.0–100.0)
Monocytes Absolute: 0.3 10*3/uL (ref 0.1–1.0)
Monocytes Relative: 5 %
Neutro Abs: 4.3 10*3/uL (ref 1.7–7.7)
Neutrophils Relative %: 74 %
Platelets: 337 10*3/uL (ref 150–400)
RBC: 4.94 MIL/uL (ref 4.22–5.81)
RDW: 11.9 % (ref 11.5–15.5)
WBC: 5.8 10*3/uL (ref 4.0–10.5)
nRBC: 0 % (ref 0.0–0.2)

## 2022-09-21 LAB — URINALYSIS, ROUTINE W REFLEX MICROSCOPIC
Bacteria, UA: NONE SEEN
Bilirubin Urine: NEGATIVE
Glucose, UA: NEGATIVE mg/dL
Hgb urine dipstick: NEGATIVE
Ketones, ur: NEGATIVE mg/dL
Leukocytes,Ua: NEGATIVE
Nitrite: NEGATIVE
Protein, ur: >=300 mg/dL — AB
Specific Gravity, Urine: 1.016 (ref 1.005–1.030)
pH: 5 (ref 5.0–8.0)

## 2022-09-21 LAB — LIPASE, BLOOD: Lipase: 662 U/L — ABNORMAL HIGH (ref 11–51)

## 2022-09-21 MED ORDER — MORPHINE SULFATE (PF) 4 MG/ML IV SOLN
4.0000 mg | Freq: Once | INTRAVENOUS | Status: AC
  Administered 2022-09-21: 4 mg via INTRAVENOUS
  Filled 2022-09-21: qty 1

## 2022-09-21 MED ORDER — ALUM & MAG HYDROXIDE-SIMETH 200-200-20 MG/5ML PO SUSP
30.0000 mL | Freq: Once | ORAL | Status: AC
  Administered 2022-09-21: 30 mL via ORAL
  Filled 2022-09-21: qty 30

## 2022-09-21 MED ORDER — FAMOTIDINE 20 MG PO TABS: 20.0000 mg | ORAL_TABLET | Freq: Two times a day (BID) | ORAL | 0 refills | Status: DC

## 2022-09-21 MED ORDER — IOHEXOL 300 MG/ML  SOLN
100.0000 mL | Freq: Once | INTRAMUSCULAR | Status: AC | PRN
  Administered 2022-09-21: 100 mL via INTRAVENOUS

## 2022-09-21 MED ORDER — ONDANSETRON HCL 4 MG PO TABS: 4.0000 mg | ORAL_TABLET | Freq: Four times a day (QID) | ORAL | 0 refills | Status: DC | PRN

## 2022-09-21 MED ORDER — ONDANSETRON HCL 4 MG/2ML IJ SOLN
4.0000 mg | Freq: Once | INTRAMUSCULAR | Status: AC
  Administered 2022-09-21: 4 mg via INTRAVENOUS
  Filled 2022-09-21: qty 2

## 2022-09-21 MED ORDER — SODIUM CHLORIDE 0.9 % IV BOLUS
1000.0000 mL | Freq: Once | INTRAVENOUS | Status: AC
  Administered 2022-09-21: 1000 mL via INTRAVENOUS

## 2022-09-21 MED ORDER — FAMOTIDINE IN NACL 20-0.9 MG/50ML-% IV SOLN
20.0000 mg | Freq: Once | INTRAVENOUS | Status: AC
  Administered 2022-09-21: 20 mg via INTRAVENOUS
  Filled 2022-09-21: qty 50

## 2022-09-21 MED ORDER — MAALOX MAX 400-400-40 MG/5ML PO SUSP: 15.0000 mL | Freq: Four times a day (QID) | ORAL | 0 refills | Status: DC | PRN

## 2022-09-21 NOTE — ED Provider Notes (Signed)
EMERGENCY DEPARTMENT AT [facility] Provider Note   CSN: [id number] Arrival date & time: 09/21/22  1003     History  Chief Complaint  Patient presents with   Abdominal Pain    Evan Sullivan is a 52 y.o. male.  Pt is a 52 yo male with pmhx significant for htn, gout, diet controlled dm2, pud, and gerd.  Pt said he's had abd pain and n/v on and off for months.  He developed severe upper abdominal pain last night.  He did take miralax because he was concerned he was constipated.  He had a bm, but that did not help sx.  Pt also said he has a hx of pud and gerd.  He has just been taking the protonix prn.  He did take it last night.  He said that did not help.  No f/c.        Home Medications Prior to Admission medications   Medication Sig Start Date End Date Taking? Authorizing Provider  amLODipine (NORVASC) 10 MG tablet TAKE 1 TABLET BY MOUTH EVERY DAY 04/20/22   Carlean Jews, NP  atenolol (TENORMIN) 100 MG tablet TAKE 1 TABLET BY MOUTH EVERY DAY 04/20/22   Carlean Jews, NP  colchicine 0.6 MG tablet Take 1 capsule by mouth daily as needed (gout pain) 06/26/20   McDonough, Salomon Fick, PA-C  fenofibrate (TRICOR) 48 MG tablet Take 1 tablet (48 mg total) by mouth daily. 03/25/22   Carlean Jews, NP  hydrocortisone cream 1 % Apply 1 application  topically daily as needed (rash).    [provider]  pantoprazole (PROTONIX) 40 MG tablet Take 1 tablet (40 mg total) by mouth daily as needed (acid reflux). 04/12/21   Horton, Clabe Seal, DO  pantoprazole (PROTONIX) 40 MG tablet Take 1 tablet (40 mg total) by mouth daily. Take 30 minutes before breakfast. 01/16/22   Arnaldo Natal, NP      Allergies    Patient has no known allergies.    Review of Systems   Review of Systems  Gastrointestinal:  Positive for abdominal pain, nausea and vomiting.  All other systems reviewed and are negative.   Physical Exam Updated Vital Signs BP (!) 161/92    Pulse 68   Temp 98.3 F (36.8 C) (Oral)   Resp 18   Ht 6\' 1"  (1.854 m)   Wt 90.7 kg   SpO2 100%   BMI 26.39 kg/m  Physical Exam Vitals and nursing note reviewed.  Constitutional:      Appearance: He is well-developed.  HENT:     Head: Normocephalic and atraumatic.     Mouth/Throat:     Mouth: Mucous membranes are moist.     Pharynx: Oropharynx is clear.  Eyes:     Extraocular Movements: Extraocular movements intact.     Pupils: Pupils are equal, round, and reactive to light.  Cardiovascular:     Rate and Rhythm: Normal rate and regular rhythm.     Heart sounds: Normal heart sounds.  Pulmonary:     Effort: Pulmonary effort is normal.     Breath sounds: Normal breath sounds.  Abdominal:     General: Abdomen is flat. Bowel sounds are normal.     Palpations: Abdomen is soft.     Tenderness: There is abdominal tenderness in the epigastric area.  Skin:    General: Skin is warm.     Capillary Refill: Capillary refill takes less than 2 seconds.  Neurological:  General: No focal deficit present.     Mental Status: He is alert and oriented to person, place, and time.  Psychiatric:        Mood and Affect: Mood normal.        Behavior: Behavior normal.     ED Results / Procedures / Treatments   Labs (all labs ordered are listed, but only abnormal results are displayed) Labs Reviewed  COMPREHENSIVE METABOLIC PANEL - Abnormal; Notable for the following components:      Result Value   CO2 21 (*)    Glucose, Bld 126 (*)    Total Protein 8.3 (*)    All other components within normal limits  LIPASE, BLOOD - Abnormal; Notable for the following components:   Lipase 662 (*)    All other components within normal limits  URINALYSIS, ROUTINE W REFLEX MICROSCOPIC - Abnormal; Notable for the following components:   Protein, ur >=300 (*)    All other components within normal limits  CBC WITH DIFFERENTIAL/PLATELET    EKG None  Radiology No results  found.  Procedures Procedures    Medications Ordered in ED Medications  sodium chloride 0.9 % bolus 1,000 mL (0 mLs Intravenous Stopped 09/21/22 1259)  ondansetron (ZOFRAN) injection 4 mg (4 mg Intravenous Given 09/21/22 1130)  famotidine (PEPCID) IVPB 20 mg premix (0 mg Intravenous Stopped 09/21/22 1201)  alum & mag hydroxide-simeth (MAALOX/MYLANTA) 200-200-20 MG/5ML suspension 30 mL (30 mLs Oral Given 09/21/22 1133)  morphine (PF) 4 MG/ML injection 4 mg (4 mg Intravenous Given 09/21/22 1259)  iohexol (OMNIPAQUE) 300 MG/ML solution 100 mL (100 mLs Intravenous Contrast Given 09/21/22 1353)    ED Course/ Medical Decision Making/ A&P Clinical Course as of 09/21/22 1558  Mon Sep 21, 2022  1540 Stable HO from [facility]  CT for pancreatitis. Improving follow up CT. [CC]    Clinical Course User Index [CC] Glyn Ade, MD                                 Medical Decision Making Amount and/or Complexity of Data Reviewed Labs: ordered. Radiology: ordered.  Risk OTC drugs. Prescription drug management.   This patient presents to the ED for concern of abd pain, this involves an extensive number of treatment options, and is a complaint that carries with it a high risk of complications and morbidity.  The differential diagnosis includes gastritis, pud, gerd, cholecystitis, pancreatitis   Co morbidities that complicate the patient evaluation   htn, gout, diet controlled dm2, pud, and gerd   Additional history obtained:  Additional history obtained from epic chart review  Lab Tests:  I Ordered, and personally interpreted labs.  The pertinent results include:  ua with proteinuria, cmp nl, cbc nl, lip elevated at 662   Imaging Studies ordered:  I ordered imaging studies including ct abd/pelvis  I independently visualized and interpreted imaging which showed pending at shift change I agree with the radiologist interpretation   Cardiac Monitoring:  The patient was maintained on a  cardiac monitor.  I personally viewed and interpreted the cardiac monitored which showed an underlying rhythm of: nsr   Medicines ordered and prescription drug management:  I ordered medication including ivfs/morphine/zofran/pepcid  for sx  Reevaluation of the patient after these medicines showed that the patient improved I have reviewed the patients home medicines and have made adjustments as needed   Test Considered:  ct   Critical Interventions:  Pain  control   Problem List / ED Course:  Abd pain and n/v:   Reevaluation:  After the interventions noted above, I reevaluated the patient and found that they have :improved   Social Determinants of Health:  Lives at home   Dispostion:  After consideration of the diagnostic results and the patients response to treatment, I feel that the patent would benefit from pending at shift change.          Final Clinical Impression(s) / ED Diagnoses Final diagnoses:  Epigastric pain  Nausea and vomiting, unspecified vomiting type  Dehydration    Rx / DC Orders ED Discharge Orders     None         Jacalyn Lefevre, MD 09/21/22 1558

## 2022-09-21 NOTE — ED Provider Notes (Signed)
Care of patient received from prior provider at 3:41 PM, please see their note for complete H/P and care plan.  Received handoff per ED course.  Clinical Course as of 09/21/22 1541  Mon Sep 21, 2022  1540 Stable HO from [facility]  CT for pancreatitis. Improving follow up CT. [CC]    Clinical Course User Index [CC] Glyn Ade, MD    Reassessment: I was called back to bedside.  All of his symptoms have resolved.  He is requesting discharge.  CT showed continued pancreatitis without other focal pathology.  Given resolution of intolerance p.o. intake and symptomatic improvement patient stable for outpatient care and management with strict return precautions.     Glyn Ade, MD 09/21/22 [phone]

## 2022-09-21 NOTE — Discharge Instructions (Addendum)
You were  diagnosed today with pancreatitis. Your lab results are otherwise nonemergent.  You are allowed to advance your diet as tolerated.  You will likely have some intermittent symptoms.  I prescribed your Pepcid and Zofran for you to use for the next few days.  Follow-up to primary care provider and continue with alcohol cessation.

## 2022-09-21 NOTE — ED Triage Notes (Addendum)
Patient began having epigastric abdominal pain since this morning along with vomiting and diarrhea. Took miralax last night and said it helped.

## 2022-09-26 ENCOUNTER — Other Ambulatory Visit: Payer: Self-pay | Admitting: Nurse Practitioner

## 2022-09-26 DIAGNOSIS — E781 Pure hyperglyceridemia: Secondary | ICD-10-CM

## 2022-10-12 ENCOUNTER — Other Ambulatory Visit: Payer: Self-pay | Admitting: Nurse Practitioner

## 2022-10-23 ENCOUNTER — Other Ambulatory Visit: Payer: Self-pay | Admitting: Nurse Practitioner

## 2022-10-23 DIAGNOSIS — I10 Essential (primary) hypertension: Secondary | ICD-10-CM

## 2022-11-03 ENCOUNTER — Ambulatory Visit: Payer: BC Managed Care – PPO | Admitting: Family Medicine

## 2022-11-03 ENCOUNTER — Encounter: Payer: Self-pay | Admitting: Family Medicine

## 2022-11-03 VITALS — BP 135/79 | HR 51 | Ht 73.0 in | Wt 193.1 lb

## 2022-11-03 DIAGNOSIS — I10 Essential (primary) hypertension: Secondary | ICD-10-CM

## 2022-11-03 DIAGNOSIS — R634 Abnormal weight loss: Secondary | ICD-10-CM | POA: Diagnosis not present

## 2022-11-03 DIAGNOSIS — R1084 Generalized abdominal pain: Secondary | ICD-10-CM | POA: Diagnosis not present

## 2022-11-03 DIAGNOSIS — R109 Unspecified abdominal pain: Secondary | ICD-10-CM | POA: Insufficient documentation

## 2022-11-03 DIAGNOSIS — E119 Type 2 diabetes mellitus without complications: Secondary | ICD-10-CM | POA: Diagnosis not present

## 2022-11-03 DIAGNOSIS — E1129 Type 2 diabetes mellitus with other diabetic kidney complication: Secondary | ICD-10-CM

## 2022-11-03 DIAGNOSIS — R1010 Upper abdominal pain, unspecified: Secondary | ICD-10-CM | POA: Insufficient documentation

## 2022-11-03 LAB — POCT GLYCOSYLATED HEMOGLOBIN (HGB A1C): HbA1c POC (<> result, manual entry): 6.8 % (ref 4.0–5.6)

## 2022-11-03 NOTE — Assessment & Plan Note (Signed)
Initial reading was elevated.  Repeat was within goal range, therefore no changes were made.  Will continue to monitor.

## 2022-11-03 NOTE — Assessment & Plan Note (Signed)
Patient weight has decreased from roughly 225 pounds to 193 pounds in the past year.  Patient not trying to lose weight.  He is not a smoker but is a former longtime alcohol user.  Abdominal imaging did not show anything concerning for possible malignancy or mass.  Would favor malabsorption/poor p.o. intake secondary to abdominal pain and chronic pancreatitis.  Recommended patient speak with his gastroenterologist regarding the pancreatitis/abdominal pain.  At next visit consider lab testing for vitamin and mineral deficiency.

## 2022-11-03 NOTE — Assessment & Plan Note (Signed)
Patient with complaint of chronic abdominal pain that waxes and wanes with intensity.  He has had a colonoscopy and endoscopy in the recent past.  Endoscopy in June 2023 was positive for H. pylori and patient took quadruple therapy for 14 days.  Developed recurrent abdominal pain afterwards and saw his gastroenterologist again in December 2023.  He currently takes pantoprazole.  Takes fenofibrate as well.  Patient has recent visits to the emergency department that showed imaging and blood testing consistent with acute on chronic pancreatitis.  Patient has abstained from alcohol for the past 58-month without resolution of abdominal pain.  Abdominal pain mostly is triggered by certain foods and occurs soon after eating certain foods such as foods with citric acid.  There is no nausea vomiting or diarrhea associated.  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  Because of chronic pancreatitis most likely chronic alcohol use which patient has recently stopped.  Advised patient that his gastroenterologist would be better equipped to workup his abdominal pain.  He may benefit from medication such as Creon if his pancreas is the cause of his abdominal pain.

## 2022-11-03 NOTE — Assessment & Plan Note (Signed)
A1c 6.8.  Diet controlled.  Continue to monitor routinely.

## 2022-11-03 NOTE — Progress Notes (Unsigned)
Established Patient Office Visit  Subjective   Patient ID: Evan Sullivan, male    DOB: 04/29/1970  Age: 52 y.o. MRN: [id number]  Chief Complaint  Patient presents with   Medical Management of Chronic Issues    HPI  Patient is complaining of abdominal pain although states that his currently getting better.  He takes pantoprazole daily.  He has a gastroenterologist and has had an endoscopy in the past.  Recalls completing the course of quadruple therapy for H. pylori last year.  He is not currently drinking any alcohol.  Not taking any NSAIDs.  Patient has no nausea or vomiting.  Does not have diarrhea.  Takes MiraLAX to help with constipation although typically has a bowel movement once a day without straining.  Patient's medications include Tricor, amlodipine, atenolol, pantoprazole.  Patient states that at home his blood pressure is similarly high to the readings today.  A1c was 6.8 today.  Patient is not on any diabetic medications.  Manages it through diet alone.  Patient complaining of unintentional weight loss.  Has decreased from 225 pounds 190 pounds.  He is not a smoker.  Is a chronic alcohol user until recently quitting.  The 10-year ASCVD risk score (Arnett DK, et al., 2019) is: 15.9%  Health Maintenance Due  Topic Date Due   OPHTHALMOLOGY EXAM  Never done   Hepatitis C Screening  Never done   DTaP/Tdap/Td (1 - Tdap) Never done   Zoster Vaccines- Shingrix (1 of 2) Never done   COVID-19 Vaccine (3 - 2023-24 season) 10/04/2022      Objective:     BP 135/79   Pulse (!) 51   Ht 6\' 1"  (1.854 m)   Wt 193 lb 1.9 oz (87.6 kg)   SpO2 100%   BMI 25.48 kg/m  {Vitals History (Optional):23777}  Physical Exam: General: Alert, oriented CV: Regular rate and rhythm Pulmonary: Lungs clear bilaterally GI: Soft, nontender.  Normal bowel sounds.  No organomegaly.   Results for orders placed or performed in visit on 11/03/22  POCT glycosylated hemoglobin (Hb A1C)  Result  Value Ref Range   Hemoglobin A1C     HbA1c POC (<> result, manual entry) 6.8 4.0 - 5.6 %   HbA1c, POC (prediabetic range)     HbA1c, POC (controlled diabetic range)          Assessment & Plan:   Type 2 diabetes, diet controlled (HCC) Assessment & Plan: A1c 6.8.  Diet controlled.  Continue to monitor routinely.  Orders: -     POCT glycosylated hemoglobin (Hb A1C)  Type 2 diabetes mellitus with proteinuria (HCC) -     POCT glycosylated hemoglobin (Hb A1C)  Generalized abdominal pain Assessment & Plan: Patient with complaint of chronic abdominal pain that waxes and wanes with intensity.  He has had a colonoscopy and endoscopy in the recent past.  Endoscopy in June 2023 was positive for H. pylori and patient took quadruple therapy for 14 days.  Developed recurrent abdominal pain afterwards and saw his gastroenterologist again in December 2023.  He currently takes pantoprazole.  Takes fenofibrate as well.  Patient has recent visits to the emergency department that showed imaging and blood testing consistent with acute on chronic pancreatitis.  Patient has abstained from alcohol for the past 48-month without resolution of abdominal pain.  Abdominal pain mostly is triggered by certain foods and occurs soon after eating certain foods such as foods with citric acid.  There is no nausea vomiting or diarrhea  associated.  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  Because of chronic pancreatitis most likely chronic alcohol use which patient has recently stopped.  Advised patient that his gastroenterologist would be better equipped to workup his abdominal pain.  He may benefit from medication such as Creon if his pancreas is the cause of his abdominal pain.   Essential (primary) hypertension Assessment & Plan: Initial reading was elevated.  Repeat was within goal range, therefore no changes were made.  Will  continue to monitor.   Unintentional weight loss Assessment & Plan: Patient weight has decreased from roughly 225 pounds to 193 pounds in the past year.  Patient not trying to lose weight.  He is not a smoker but is a former longtime alcohol user.  Abdominal imaging did not show anything concerning for possible malignancy or mass.  Would favor malabsorption/poor p.o. intake secondary to abdominal pain and chronic pancreatitis.  Recommended patient speak with his gastroenterologist regarding the pancreatitis/abdominal pain.  At next visit consider lab testing for vitamin and mineral deficiency.      Return in about 3 months (around 02/03/2023) for DM.    Sandre Kitty, MD

## 2022-11-03 NOTE — Patient Instructions (Addendum)
It was nice to see you today,  We addressed the following topics today: - call you gastroenterologist to schedule an appointment if you have abdominal pain again.    Have a great day,  Frederic Jericho, MD

## 2022-11-30 ENCOUNTER — Telehealth: Payer: Self-pay | Admitting: Gastroenterology

## 2022-11-30 NOTE — Telephone Encounter (Signed)
Pt reports he has been having stomach cramping that has been bothering him and reports it seems to be worse at night. Pt scheduled to see Amy Esterwood PA 12/08/22 at 1:30pm. Pt aware of appt.

## 2022-11-30 NOTE — Telephone Encounter (Signed)
Patient called stated he has been having a lot of stomach problems requested to speak with a nurse. Thank you

## 2022-12-01 ENCOUNTER — Ambulatory Visit: Payer: BC Managed Care – PPO | Admitting: Family Medicine

## 2022-12-01 ENCOUNTER — Encounter: Payer: BC Managed Care – PPO | Admitting: Nurse Practitioner

## 2022-12-08 ENCOUNTER — Other Ambulatory Visit (INDEPENDENT_AMBULATORY_CARE_PROVIDER_SITE_OTHER): Payer: BC Managed Care – PPO

## 2022-12-08 ENCOUNTER — Ambulatory Visit (INDEPENDENT_AMBULATORY_CARE_PROVIDER_SITE_OTHER): Payer: BC Managed Care – PPO | Admitting: Physician Assistant

## 2022-12-08 ENCOUNTER — Encounter: Payer: Self-pay | Admitting: Physician Assistant

## 2022-12-08 VITALS — BP 110/70 | HR 91 | Ht 73.0 in | Wt 184.4 lb

## 2022-12-08 DIAGNOSIS — K219 Gastro-esophageal reflux disease without esophagitis: Secondary | ICD-10-CM

## 2022-12-08 DIAGNOSIS — K861 Other chronic pancreatitis: Secondary | ICD-10-CM

## 2022-12-08 DIAGNOSIS — Z800 Family history of malignant neoplasm of digestive organs: Secondary | ICD-10-CM

## 2022-12-08 DIAGNOSIS — Z8719 Personal history of other diseases of the digestive system: Secondary | ICD-10-CM

## 2022-12-08 DIAGNOSIS — R634 Abnormal weight loss: Secondary | ICD-10-CM

## 2022-12-08 DIAGNOSIS — F1090 Alcohol use, unspecified, uncomplicated: Secondary | ICD-10-CM

## 2022-12-08 DIAGNOSIS — K760 Fatty (change of) liver, not elsewhere classified: Secondary | ICD-10-CM

## 2022-12-08 DIAGNOSIS — R1013 Epigastric pain: Secondary | ICD-10-CM

## 2022-12-08 DIAGNOSIS — R109 Unspecified abdominal pain: Secondary | ICD-10-CM

## 2022-12-08 LAB — LIPASE: Lipase: 312 U/L — ABNORMAL HIGH (ref 11.0–59.0)

## 2022-12-08 LAB — CBC WITH DIFFERENTIAL/PLATELET
Basophils Absolute: 0.1 10*3/uL (ref 0.0–0.1)
Basophils Relative: 2 % (ref 0.0–3.0)
Eosinophils Absolute: 0.3 10*3/uL (ref 0.0–0.7)
Eosinophils Relative: 5.1 % — ABNORMAL HIGH (ref 0.0–5.0)
HCT: 42.1 % (ref 39.0–52.0)
Hemoglobin: 14.4 g/dL (ref 13.0–17.0)
Lymphocytes Relative: 40.9 % (ref 12.0–46.0)
Lymphs Abs: 2.2 10*3/uL (ref 0.7–4.0)
MCHC: 34.2 g/dL (ref 30.0–36.0)
MCV: 92.9 fL (ref 78.0–100.0)
Monocytes Absolute: 0.3 10*3/uL (ref 0.1–1.0)
Monocytes Relative: 6.6 % (ref 3.0–12.0)
Neutro Abs: 2.4 10*3/uL (ref 1.4–7.7)
Neutrophils Relative %: 45.4 % (ref 43.0–77.0)
Platelets: 401 10*3/uL — ABNORMAL HIGH (ref 150.0–400.0)
RBC: 4.53 Mil/uL (ref 4.22–5.81)
RDW: 12.9 % (ref 11.5–15.5)
WBC: 5.3 10*3/uL (ref 4.0–10.5)

## 2022-12-08 NOTE — Progress Notes (Signed)
Subjective:    Patient ID: Evan Sullivan, male    DOB: January 09, 1971, 52 y.o.   MRN: [id number]  HPI [place] is a pleasant 52 year old male, established with Dr. Tomasa Rand and last seen in the office in December 2023 by Alcide Evener.  He comes in today for further evaluation of persistent abdominal pain and after ER visit in August 2024.  When he was initially seen last winter he had complaints of upper abdominal pain which had been present for about a week and had improved just prior to that visit.  This was felt likely secondary to alcohol associated pancreatitis. Patient has history of H. pylori gastropathy and had EGD in June 2023 which normal-appearing esophagus, there were several shallow antral ulcerations and localized severe edema and erythema of the duodenal bulb.  Biopsies returned positive for H. pylori.  He completed treatment and did an H. pylori stool antigen which returned negative. Colonoscopy was done in October 2021, he had 115 mm polyp removed from the ascending colon, and two 3 to 5 mm polyps in the rectum.  Path showed to the ascending colon polyp to be a submucosal lipoma negative for dysplasia or malignancy and the rectal polyps were hyperplastic.  Patient says that he has been having ongoing problems with abdominal pain for several months at this point he says usually this seems to be worse at night and has been keeping him awake at night.  He also has the pain during the day though not necessarily all day long he says it seems to come and go but is present at some point every day.  Usually worse after eating and fairly immediately after eating.  He is not having any nausea or vomiting.  He has had weight loss from 222.84 over the past year.  He does feel that he eats less than he used to because of abdominal pain.  He is not having any problems with diarrhea or Steatorrhea usually tends more towards constipation.  No melena hematochezia.  He says he has not used any aspirin or  NSAIDs since he was told of the ulcer problems in 2023, has tried Tylenol which does not help his abdominal pain a whole lot. He does have pantoprazole at home but says sometimes when he takes this on an empty stomach it seems to cause abdominal pain, he has not been taking this regularly, uses as needed when he feels gassy or bloated.  He admits to previously heavy EtOH use but says he has stopped at this point over the past couple of months. Workup from the ER visit August 2024 with labs CBC within normal limits Potassium 4.1/BUN 12/creatinine 0.96/albumin 4.5 LFTs within normal limits Lipase 662  CT abdomen and pelvis with contrast showed diffuse diffusely decreased attenuation of the liver consistent with steatosis unchanged subcentimeter hypodensity in the left hepatic lobe too small to characterize, unremarkable gallbladder Possible mild dilation of the distal common bile duct versus small cyst or pseudocyst at the superior aspect of the pancreatic head Mild inflammation about the pancreatic head and uncinate process where there is coarse chronic calcification and increased diffuse pancreatic ductal dilation measuring up to 1.2 cm in the pancreatic head Read as  acute on chronic pancreatitis and hepatic steatosis.    Review of Systems.Pertinent positive and negative review of systems were noted in the above HPI section.  All other review of systems was otherwise negative.   Outpatient Encounter Medications as of 12/08/2022  Medication Sig  amLODipine (NORVASC) 10 MG tablet TAKE 1 TABLET BY MOUTH EVERY DAY   atenolol (TENORMIN) 100 MG tablet TAKE 1 TABLET BY MOUTH EVERY DAY   colchicine 0.6 MG tablet Take 1 capsule by mouth daily as needed (gout pain)   fenofibrate (TRICOR) 48 MG tablet TAKE 1 TABLET BY MOUTH EVERY DAY   hydrocortisone cream 1 % Apply 1 application  topically daily as needed (rash).   pantoprazole (PROTONIX) 40 MG tablet TAKE 1 TABLET (40 MG TOTAL) BY MOUTH DAILY TAKE  30 MINUTES BEFORE BREAKFAST   [DISCONTINUED] ondansetron (ZOFRAN) 4 MG tablet Take 1 tablet (4 mg total) by mouth every 6 (six) hours as needed for nausea or vomiting.   [DISCONTINUED] pantoprazole (PROTONIX) 40 MG tablet Take 1 tablet (40 mg total) by mouth daily as needed (acid reflux).   No facility-administered encounter medications on file as of 12/08/2022.   No Known Allergies Patient Active Problem List   Diagnosis Date Noted   Abdominal pain 11/03/2022   Unintentional weight loss 11/03/2022   Urinary tract infection with hematuria 07/09/2022   Gastroesophageal reflux disease with esophagitis 01/04/2022   Acute gout of right wrist 09/07/2021   Type 2 diabetes, diet controlled (HCC) 09/07/2021   Acute constipation 05/26/2021   Primary osteoarthritis of both knees 05/26/2021   Hospital discharge follow-up 05/20/2021   Iron deficiency anemia due to chronic blood loss 05/20/2021   Body mass index 29.0-29.9, adult 05/20/2021   Acute pancreatitis 05/14/2021   Pancreatitis 05/13/2021   Marijuana abuse 05/13/2021   Alcohol abuse 05/13/2021   Hypokalemia 05/13/2021   History of gout 05/13/2021   HLD (hyperlipidemia) 05/13/2021   Hyperglycemia 05/13/2021   Encounter for screening colonoscopy 12/31/2018   Family history of colon cancer in father 12/31/2018   Dysuria 12/31/2018   Gastroesophageal reflux disease without esophagitis 09/15/2017   Status post total replacement of hip 08/02/2017   Avascular necrosis of bone of hip, right (HCC) 03/15/2017   Essential (primary) hypertension 03/15/2017   History of peptic ulcer disease 03/15/2017   Normocytic anemia 11/01/2015   SOB (shortness of breath) 11/01/2015   Syncope 11/01/2015   Social History   Socioeconomic History   Marital status: Single    Spouse name: Not on file   Number of children: Not on file   Years of education: Not on file   Highest education level: Not on file  Occupational History   Not on file  Tobacco Use    Smoking status: Never   Smokeless tobacco: Never  Vaping Use   Vaping status: Never Used  Substance and Sexual Activity   Alcohol use: Not Currently   Drug use: Yes    Types: Marijuana    Comment: daily    Sexual activity: Yes    Birth control/protection: Condom  Other Topics Concern   Not on file  Social History Narrative   Not on file   Social Determinants of Health   Financial Resource Strain: Unknown (04/23/2017)   Received from [facility], [facility]   Overall Financial Resource Strain (CARDIA)    Difficulty of Paying Living Expenses: Patient declined  Food Insecurity: Unknown (04/23/2017)   Received from [facility], [facility]   Hunger Vital Sign    Worried About Running Out of Food in the Last Year: Patient declined    Ran Out of Food in the Last Year: Patient declined  Transportation Needs: Unknown (04/23/2017)   Received from [facility], [employer]   [employer] - Transportation    Lack of Transportation (Medical): Patient declined    Lack of Transportation (Non-Medical): Patient declined  Physical Activity: Unknown (04/23/2017)   Received from [facility], [facility]   Exercise Vital Sign    Days of Exercise per Week: Patient declined    Minutes of Exercise per Session: Patient declined  Stress: Unknown (04/23/2017)   Received from [facility], [facility]   [employer] of Occupational Health - Occupational Stress Questionnaire    Feeling of Stress : Patient declined  Social Connections: Unknown (04/23/2017)   Received from [facility], [facility]   Social Connection and Isolation Panel [NHANES]    Frequency of Communication with Friends and Family: Patient declined    Frequency of Social Gatherings with Friends and Family: Patient declined     Attends Religious Services: Patient declined    Database administrator or Organizations: Patient declined    Attends Engineer, structural: Patient declined    Marital Status: Patient declined  Catering manager Violence: Not on file    Mr. Burbano family history includes Colon cancer (age of onset: 3) in his father; Diabetes in his father; Hypertension in his father and mother.      Objective:    Vitals:   12/08/22 1350  BP: 110/70  Pulse: 91    Physical Exam.Well-developed well-nourished older AA male  in no acute distress, accompanied by male friend, height, Weight, 184 BMI 24.3  HEENT; nontraumatic normocephalic, EOMI, PE R LA, sclera anicteric. Oropharynx; not examined today Neck; supple, no JVD Cardiovascular; regular rate and rhythm with S1-S2, no murmur rub or gallop Pulmonary; Clear bilaterally Abdomen; soft, no focal tenderness, nondistended, no palpable mass or hepatosplenomegaly, bowel sounds are active Rectal; not done Skin; benign exam, no jaundice rash or appreciable lesions Extremities; no clubbing cyanosis or edema skin warm and dry Neuro/Psych; alert and oriented x4, grossly nonfocal mood and affect appropriate        Assessment & Plan:   #47  52 year old male, presenting now with persistent complaints of abdominal pain intermittent throughout the day and more bothersome at nighttime over the past 3 to 4 months.  This has been associated with gradual weight loss over 20 pounds over the past year.  Patient had recent CT imaging with ER visit in August 2024 showing diffuse hepatic steatosis and mild acute on chronic pancreatitis with coarse calcifications noted in the pancreatic head and uncinate process and associated with a dilated pancreatic duct.  This is in the setting of prior heavy EtOH use and is consistent with chronic calcific pancreatitis which is very likely the etiology of his ongoing pain  He says he has stopped drinking, and this  was discussed in detail with him today regarding importance of complete abstinence moving forward  #2 history of antral gastropathy, shallow ulcerations and duodenal bulb ulceration on EGD June 2023.  That was in association with NSAID use and also found to be H. pylori positive.  He was treated and H. pylori and proved  eradicated with negative H. pylori stool antigen  #3 GERD-not using regular PPI #4.  Family history of colon cancer in patient's father-patient is up-to-date with colonoscopy last done 2021 with finding of hyperplastic polyps only and and ascending colon submucosal lipoma Indicated for 5-year interval follow-up given family history  #5 EtOH and cannabis use disorder #6.  History of iron deficiency anemia- most recent hemoglobin normal #7.  Hypertension  Plan; as above we discussed that it is imperative that he stop alcohol use altogether, and forever moving forward, and he voices understanding We discussed eating a healthy moderate fat diet, adding snacks twice daily and also increasing his caloric intake with either high-calorie boost or Ensure twice daily between meals given weight loss. Start pantoprazole 40 mg p.o. every morning, can take just before eating Continue to avoid aspirin and NSAIDs Okay to use regular strength Tylenol 2 p.o. every 6 hours as needed for pain and advised to try Tylenol PM for sleep Discussed that if pain progresses over time and he requires stronger pain medication that he could be referred to pain management, but we would try to avoid narcotic use  Will check CBC with differential, lipase, c-Met and fecal elastase today Plan follow-up with Dr. Tomasa Rand or myself in 2 months and he knows to call in the interim for any change or worsening of symptoms.  Bashir Marchetti Oswald Hillock PA-C 12/08/2022   Cc: Sandre Kitty, MD

## 2022-12-08 NOTE — Patient Instructions (Addendum)
Take Protonix 40 mg in the morning before breakfast.  Use regular strength tylenol during the day and try tylenol PM at bedtime.  Your provider has requested that you go to the basement level for lab work before leaving today. Press "B" on the elevator. The lab is located at the first door on the left as you exit the elevator.  Eat five times daily , try ensure or Boost.  _______________________________________________________  If your blood pressure at your visit was 140/90 or greater, please contact your primary care physician to follow up on this.  _______________________________________________________  If you are age 71 or older, your body mass index should be between 23-30. Your Body mass index is 24.33 kg/m. If this is out of the aforementioned range listed, please consider follow up with your Primary Care Provider.  If you are age 67 or younger, your body mass index should be between 19-25. Your Body mass index is 24.33 kg/m. If this is out of the aformentioned range listed, please consider follow up with your Primary Care Provider.   ________________________________________________________  The [place] [facility] providers would like to encourage you to use [facility] to communicate with providers for non-urgent requests or questions.  Due to long hold times on the telephone, sending your provider a message by [facility] may be a faster and more efficient way to get a response.  Please allow 48 business hours for a response.  Please remember that this is for non-urgent requests.   It was a pleasure to see you today!  Thank you for trusting me with your gastrointestinal care!

## 2022-12-10 NOTE — Progress Notes (Signed)
Agree with the assessment and plan as outlined by Mike Gip, PA-C.  Can also consider empiric trial of PERT if elastase normal but symptoms continue.  Yurani Fettes E. Tomasa Rand, MD

## 2022-12-14 ENCOUNTER — Other Ambulatory Visit: Payer: BC Managed Care – PPO

## 2022-12-14 DIAGNOSIS — K861 Other chronic pancreatitis: Secondary | ICD-10-CM | POA: Diagnosis not present

## 2022-12-14 DIAGNOSIS — R1013 Epigastric pain: Secondary | ICD-10-CM

## 2022-12-14 DIAGNOSIS — R634 Abnormal weight loss: Secondary | ICD-10-CM

## 2022-12-14 DIAGNOSIS — K219 Gastro-esophageal reflux disease without esophagitis: Secondary | ICD-10-CM

## 2022-12-15 IMAGING — US US ABDOMEN LIMITED
1 series · 14 of 25 positions shown · non-contrast
Comparison: CT 05/13/2021

CLINICAL DATA: Pancreatitis

EXAM:
ULTRASOUND ABDOMEN LIMITED RIGHT UPPER QUADRANT

[Series 1: us abdomen limited ruq (liver/gb) · 14 of 58 slices shown]
[im 1/58]
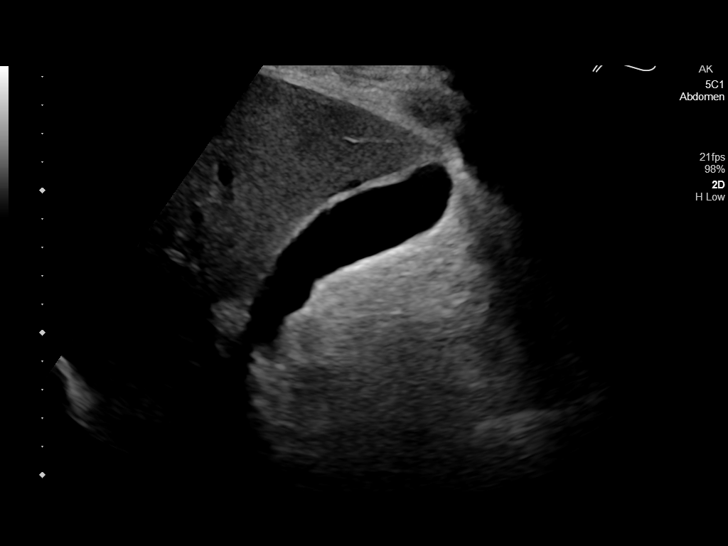
[im 5/58]
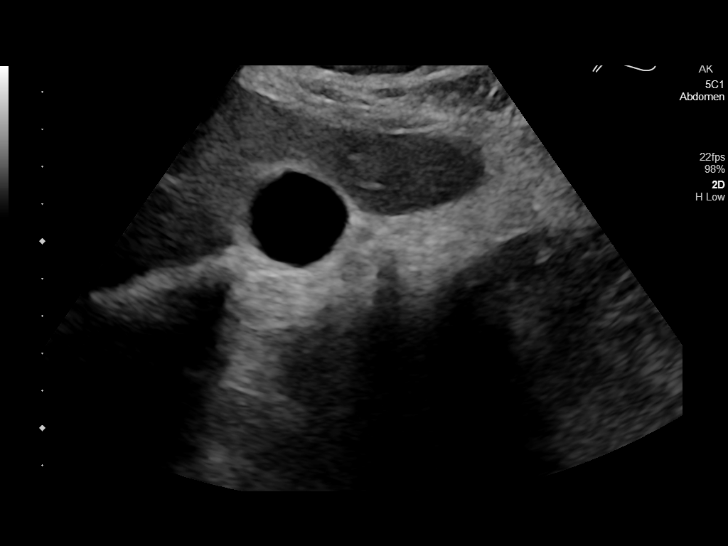
[im 10/58]
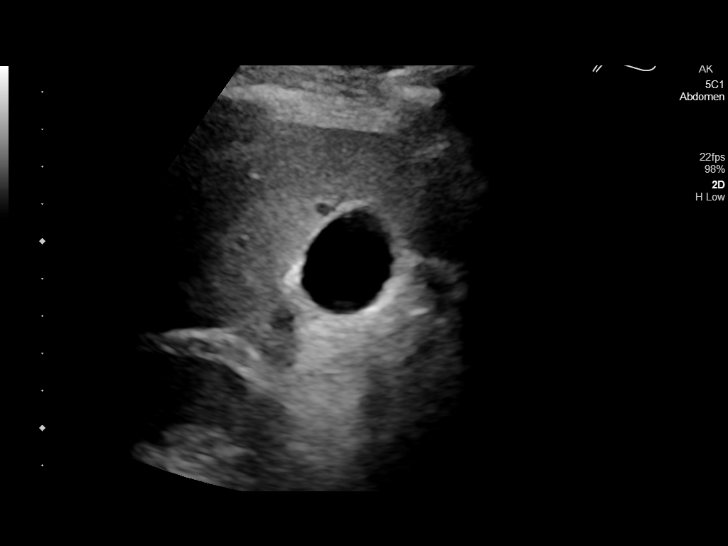
[im 15/58]
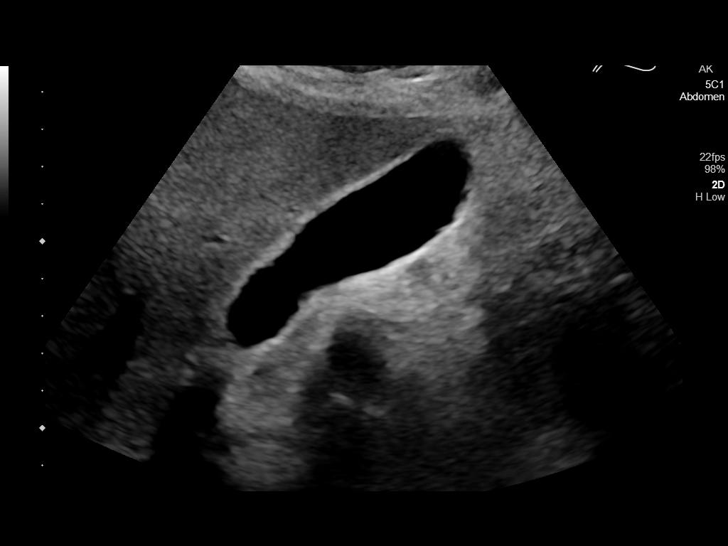
[im 20/58]
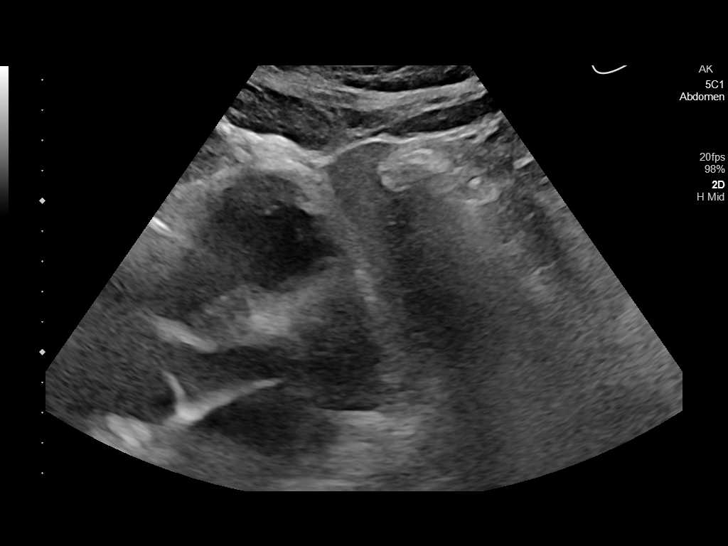
[im 22/58]
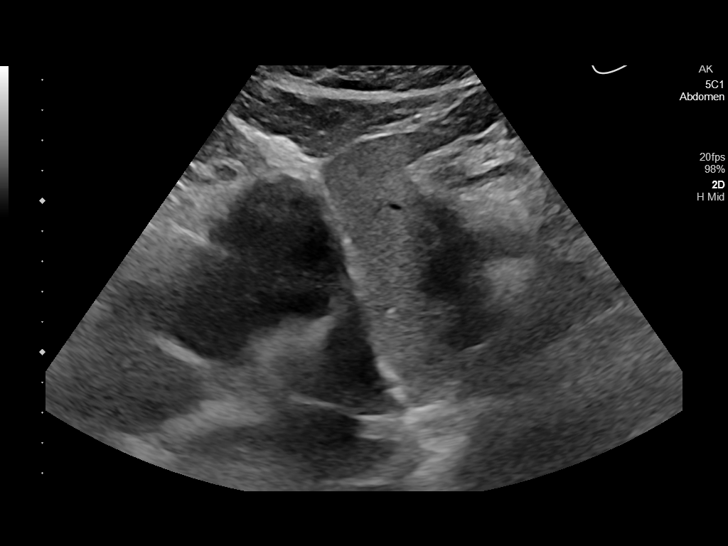
[im 27/58]
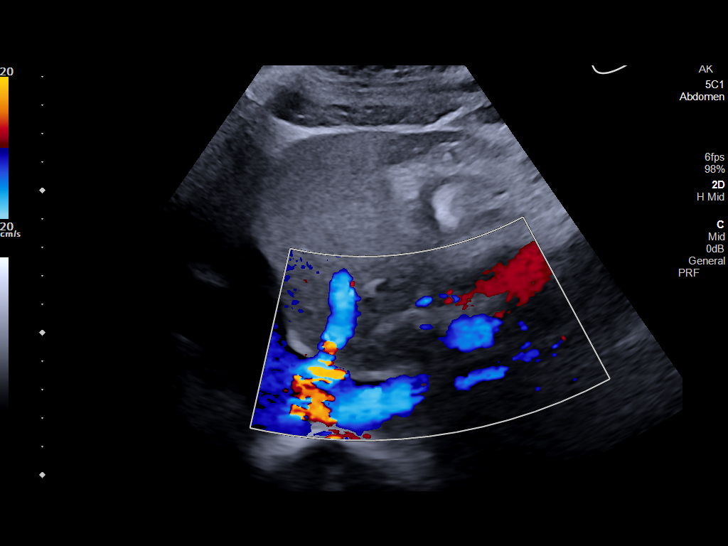
[im 31/58]
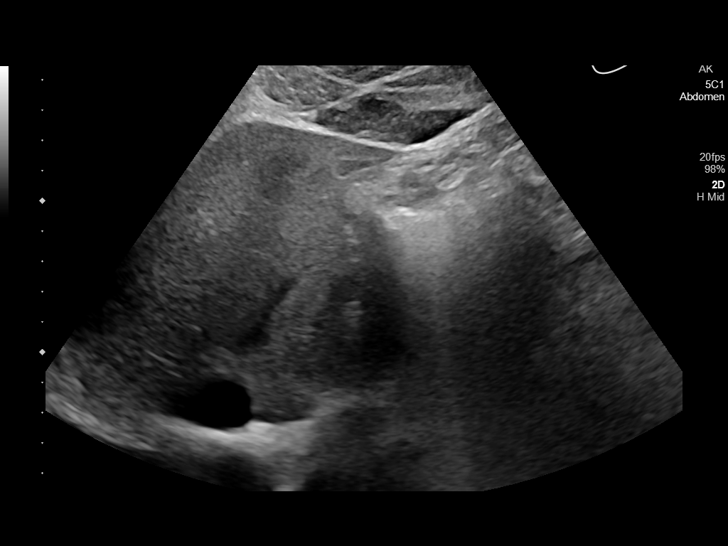
[im 36/58]
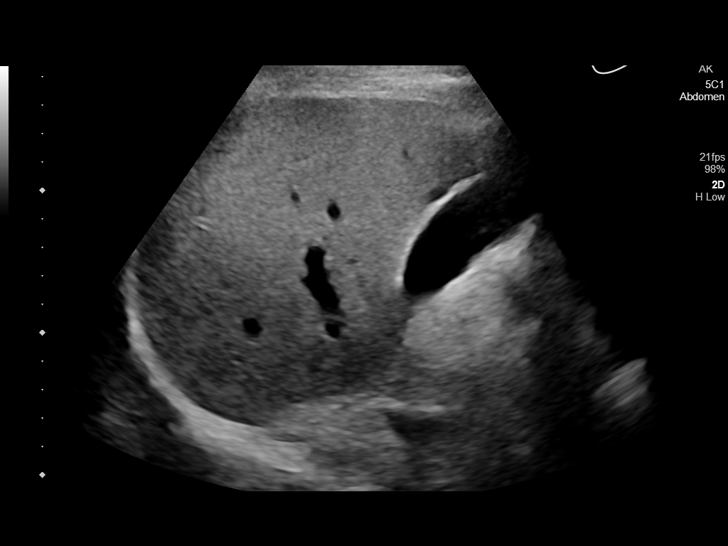
[im 39/58]
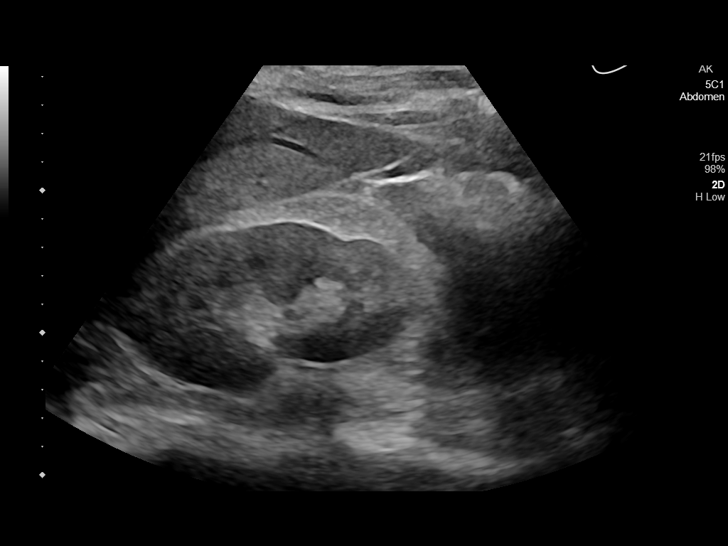
[im 43/58]
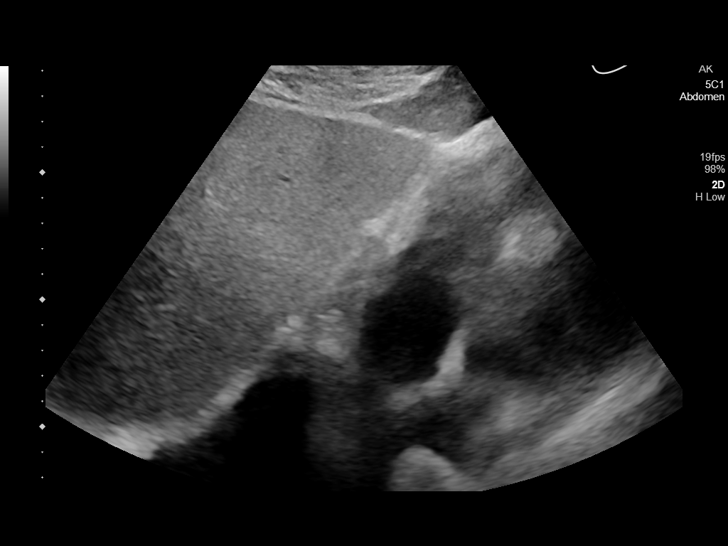
[im 48/58]
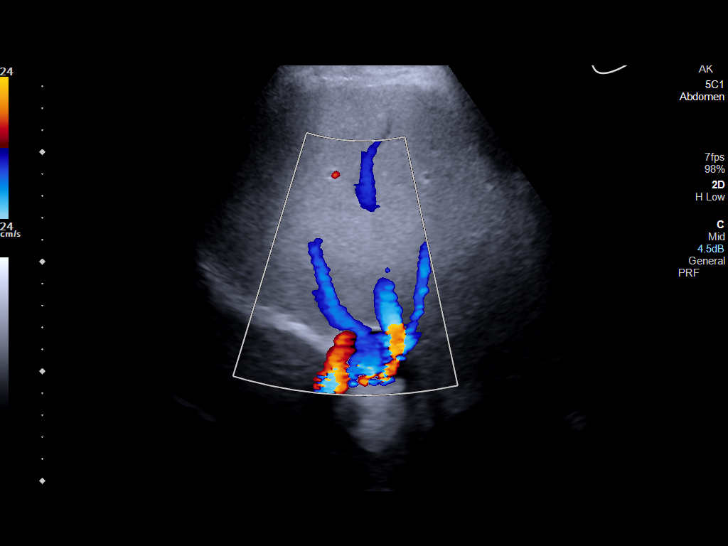
[im 53/58]
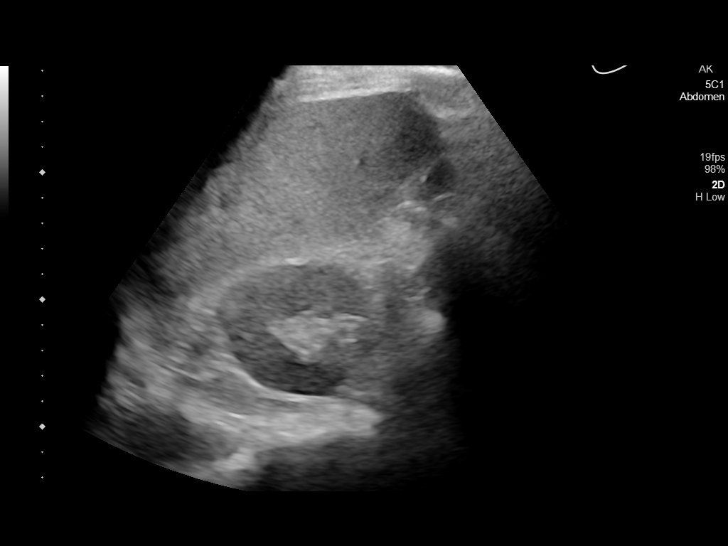
[im 58/58]
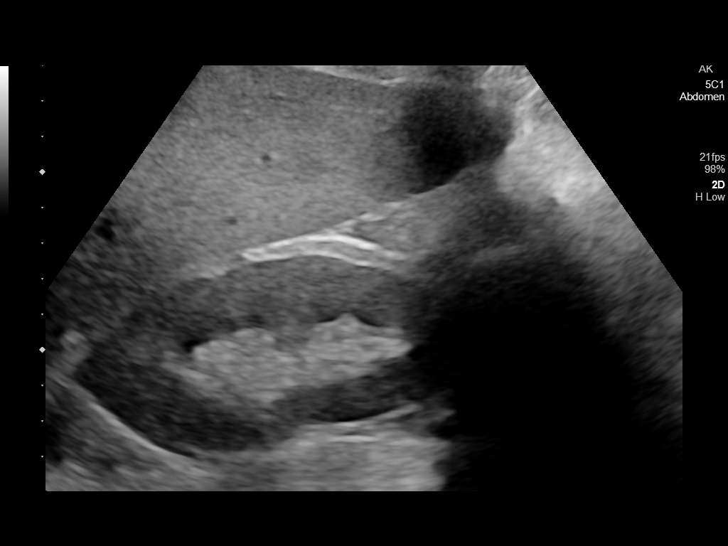

[14 of 25 positions shown; findings below may reference images not displayed]

FINDINGS: Gallbladder:

Gallstones: None

Sludge: None

Gallbladder Wall: Minimal adenomyomatosis noted in the wall the
gallbladder

Pericholecystic fluid: None

Sonographic Murphy's Sign: Negative per technologist

Common bile duct:

Diameter: 2 mm

Liver:

Parenchymal echogenicity: Within normal limits

Contours: Normal

Lesions: None

Portal vein: Patent.  Hepatopetal flow

Other: None.
IMPRESSION: No significant sonographic abnormality of the liver or gallbladder.

## 2022-12-23 ENCOUNTER — Telehealth: Payer: Self-pay | Admitting: Physician Assistant

## 2022-12-23 NOTE — Telephone Encounter (Signed)
PT is calling to get an update on stool test results. Please advise.

## 2022-12-23 NOTE — Telephone Encounter (Signed)
[employer]. Results are still pending. Estimated to have final by the end of this week. Information given to the patient. He thanks me for the call.

## 2022-12-25 ENCOUNTER — Other Ambulatory Visit: Payer: Self-pay | Admitting: Family Medicine

## 2022-12-25 DIAGNOSIS — E781 Pure hyperglyceridemia: Secondary | ICD-10-CM

## 2022-12-25 LAB — PANCREATIC ELASTASE, FECAL: Pancreatic Elastase-1, Stool: 239 ug/g (ref >200)

## 2023-02-05 ENCOUNTER — Encounter: Payer: Self-pay | Admitting: Family Medicine

## 2023-02-09 ENCOUNTER — Encounter: Payer: Self-pay | Admitting: Gastroenterology

## 2023-02-09 ENCOUNTER — Ambulatory Visit: Payer: BC Managed Care – PPO | Admitting: Gastroenterology

## 2023-02-09 VITALS — BP 158/98 | HR 59 | Ht 73.0 in | Wt 173.1 lb

## 2023-02-09 DIAGNOSIS — R634 Abnormal weight loss: Secondary | ICD-10-CM | POA: Diagnosis not present

## 2023-02-09 DIAGNOSIS — R1010 Upper abdominal pain, unspecified: Secondary | ICD-10-CM

## 2023-02-09 DIAGNOSIS — K860 Alcohol-induced chronic pancreatitis: Secondary | ICD-10-CM

## 2023-02-09 DIAGNOSIS — K5900 Constipation, unspecified: Secondary | ICD-10-CM | POA: Diagnosis not present

## 2023-02-09 DIAGNOSIS — F1011 Alcohol abuse, in remission: Secondary | ICD-10-CM

## 2023-02-09 DIAGNOSIS — Z8619 Personal history of other infectious and parasitic diseases: Secondary | ICD-10-CM

## 2023-02-09 DIAGNOSIS — K8681 Exocrine pancreatic insufficiency: Secondary | ICD-10-CM

## 2023-02-09 DIAGNOSIS — F1290 Cannabis use, unspecified, uncomplicated: Secondary | ICD-10-CM

## 2023-02-09 DIAGNOSIS — K861 Other chronic pancreatitis: Secondary | ICD-10-CM | POA: Diagnosis not present

## 2023-02-09 MED ORDER — GABAPENTIN 300 MG PO CAPS: 300.0000 mg | ORAL_CAPSULE | Freq: Every day | ORAL | 1 refills | Status: DC

## 2023-02-09 MED ORDER — PANCRELIPASE (LIP-PROT-AMYL) 36000-114000 UNITS PO CPEP: 72000.0000 [IU] | ORAL_CAPSULE | Freq: Three times a day (TID) | ORAL | 1 refills | Status: DC

## 2023-02-09 NOTE — Patient Instructions (Addendum)
 _______________________________________________________  If your blood pressure at your visit was 140/90 or greater, please contact your primary care physician to follow up on this.  _______________________________________________________  If you are age 52 or older, your body mass index should be between 23-30. Your Body mass index is 22.84 kg/m. If this is out of the aforementioned range listed, please consider follow up with your Primary Care Provider.  If you are age 96 or younger, your body mass index should be between 19-25. Your Body mass index is 22.84 kg/m. If this is out of the aformentioned range listed, please consider follow up with your Primary Care Provider.   ________________________________________________________  The [place] [facility] providers would like to encourage you to use [facility] to communicate with providers for non-urgent requests or questions.  Due to long hold times on the telephone, sending your provider a message by [facility] may be a faster and more efficient way to get a response.  Please allow 48 business hours for a response.  Please remember that this is for non-urgent requests.  _______________________________________________________  We have sent the following medications to your pharmacy for you to pick up at your convenience: Creon  2 tablet with each meal Gabapentin  daily at bedtime  You have been scheduled for an appointment with Dr. Stacia on 03-26-23 at 340pm . Please arrive 10 minutes early for your appointment.  It was a pleasure to see you today!  Thank you for trusting me with your gastrointestinal care!

## 2023-02-09 NOTE — Progress Notes (Signed)
 Discussed the use of AI scribe software for clinical note transcription with the patient, who gave verbal consent to proceed.  HPI : Evan Sullivan is a 53 y.o. male with a history of chronic pancreatitis, who presents for follow up with persistent abdominal pain.  He was last seen in our office November 5 by any [place].  A fecal elastase was checked and was unremarkable.  He was advised to take Tylenol  PM to help with his abdominal pain and improve sleep. He has continued to have significant pain over the past few months, but does report in the last few days, it has decreased in intensity.  The pain is described as a constant, everyday discomfort, with occasional exacerbations.  It is localized to the upper abdomen.  The patient had been abstinent from alcohol for three months and denied smoking tobacco, but admitted to smoking marijuana, primarily as a sleep aid. He was on a regimen of pantoprazole  for acid reflux, which he felt helped with gas and digestion.  The patient also reported difficulty with bowel movements, sometimes feeling constipated despite taking Miralax . He described a sensation of early satiety, often feeling full after consuming only small amounts of food.  He denies symptoms of diarrhea or oily/greasy stools.  The patient had only one recent episode of nausea and vomiting, which he attributed to feeling full. He also reported a history of gastritis and a previous diagnosis of H. Pylori infection. The patient noted certain foods, such as broccoli and lemonade, seemed to exacerbate his stomach discomfort.  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  It was recommended he follow-up with surgery to discuss further management.  However, he states he never knew about this finding and never saw surgery as an outpatient. He  denies significant postprandial pain, nausea/vomiting or sitophobia.  He has lost considerable weight over the past few months.  In August he weighed 200 pounds, October 193 pounds, November 184 pounds, and now weighs 173 pounds today.  He attributes this weight loss to poor appetite and getting full early.  The patient's symptoms, particularly the persistent abdominal pain and weight loss, were suggestive of ongoing issues related to his chronic pancreatitis. Despite some recent improvement, the patient's condition continued to significantly impact his quality of life.        He underwent an EGD and colonoscopy in June 2023 due to having iron  deficiency anemia. The EGD identified gastritis with hemorrhage and duodenal ulcerations. Gastric biopsies were positive for H. Pylori which was treated with Metronidazole /Doxy/Pepto and bid PPI and post treatment H. Pylori stool antigen test was negative.  The colonoscopy identified three tubular adenomatous/hyperplastic polys removed from the ascending and descending colon, diverticulosis to the ascending, transverse and sigmoid colon and nonbleeding internal hemorrhoids.  He was recommended to repeat colonoscopy in 5 years.  Colonoscopy 11/30/2019 identified a 15mm lipoma/polyp removed from the ascending colon and two hyperplastic rectal polyps. Father was diagnosed with colon cancer in his 31's    Fecal elastase 239 (within normal limits) Lipase 312 (12/08/2022)  CT abdomen/pelvis August 2024 IMPRESSION: 1. Acute on chronic pancreatitis. 2. Hepatic steatosis   EGD 07/16/2021: - Normal esophagus. - Gastroesophageal flap valve classified as Hill Grade IV (no fold, wide open lumen, hiatal hernia present). - Gastritis with hemorrhage.  Localized severe inflammation with hemorrhage characterized by adherent blood, erythema and shallow ulcerations was  found in the gastric antrum. Biopsies were taken with a cold forceps for Helicobacter pylori testing.  Estimated blood loss was minimal.Biopsied. This is the likely source of the patient's iron  deficiency anemia. - Duodenitis. - Localized severe inflammation characterized by congestion (edema), erythema and shallow ulcerations was found in the duodenal bulb and in the second portion of the duodenum. Biopsies were taken with a cold forceps for histology. Estimated blood loss was minimal.   Colonoscopy 07/16/2021: - One 6 mm polyp in the ascending colon, removed with a cold snare. Resected and retrieved. - Two 3 to 5 mm polyps in the descending colon, removed retrieved. - Diverticulosis in the sigmoid colon, in the transverse colon and in the ascending colon. - Non-bleeding internal hemorrhoids. - 7 year recall colonoscopy  1. Surgical [P], duodenal biopsies   ACUTE DUODENITIS 2. 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 Surgical [P], colon, ascending and descending, polyp (3) TUBULAR ADENOMA HYPERPLASTIC POLYP NEGATIVE FOR HIGH-GRADE DYSPLASIA AND CARCINOMA   Colonoscopy 11/30/2019: - One 15 mm polyp in the ascending colon, removed with a hot snare. Resected and retrieved. Clip (MR conditional) was placed. - Two 3 to 5 mm polyps in the rectum, removed with a cold snare. Resected and retrieved. - The examination was otherwise normal. - The distal rectum and anal verge are normal on retroflexion view. A. COLON POLYP, ASCENDING; HOT SNARE:  - COLONIC MUCOSA WITH SUBMUCOSAL LIPOMA.  - NEGATIVE FOR DYSPLASIA AND MALIGNANCY.  B.  RECTUM POLYP; COLD SNARE:  - HYPERPLASTIC POLYP.  - NEGATIVE FOR DYSPLASIA AND MALIGNANCY.       Past Medical History:  Diagnosis Date   Anemia    Arthritis    Avascular necrosis of femoral head, right (HCC) 2019    Diet-controlled type 2 diabetes mellitus (HCC) 09/07/2021   GERD (gastroesophageal reflux disease)    Gout    Hypertension    Peptic ulcer disease      Past Surgical History:  Procedure Laterality Date   COLONOSCOPY     COLONOSCOPY WITH PROPOFOL  N/A 11/30/2019   Procedure: COLONOSCOPY WITH PROPOFOL ;  Surgeon: Unk Corinn Skiff, MD;  Location: [facility];  Service: Endoscopy;  Laterality: N/A;  priority 4   KNEE ARTHROSCOPY, MEDIAL PATELLO FEMORAL LIGAMENT REPAIR Bilateral 2004,2000   POLYPECTOMY  11/30/2019   Procedure: POLYPECTOMY;  Surgeon: Unk Corinn Skiff, MD;  Location: [facility];  Service: Endoscopy;;   TOTAL HIP ARTHROPLASTY Right 08/02/2017   Procedure: TOTAL HIP ARTHROPLASTY;  Surgeon: Mardee Lynwood SQUIBB, MD;  Location: [facility] OR[facility];  Service: Orthopedics;  Laterality: Right;   Family History  Problem Relation Age of Onset   Hypertension Mother    Hypertension Father    Colon cancer Father 5   Diabetes Father    Pancreatic cancer Neg Hx    Esophageal cancer Neg Hx    Liver cancer Neg Hx    Stomach cancer Neg Hx    Social History   Tobacco Use   Smoking status: Never   Smokeless tobacco: Never  Vaping Use   Vaping status: Never Used  Substance Use Topics   Alcohol use: Not Currently   Drug use: Yes    Types: Marijuana    Comment: daily    Current Outpatient Medications  Medication Sig Dispense Refill   amLODipine  (NORVASC ) 10 MG  tablet TAKE 1 TABLET BY MOUTH EVERY DAY 90 tablet 1   atenolol  (TENORMIN ) 100 MG tablet TAKE 1 TABLET BY MOUTH EVERY DAY 90 tablet 1   colchicine  0.6 MG tablet Take 1 capsule by mouth daily as needed (gout pain) 30 tablet 1   fenofibrate  (TRICOR ) 48 MG tablet TAKE 1 TABLET BY MOUTH EVERY DAY 90 tablet 0   hydrocortisone  cream 1 % Apply 1 application  topically daily as needed (rash).     pantoprazole  (PROTONIX ) 40 MG tablet TAKE 1 TABLET (40 MG TOTAL) BY MOUTH DAILY TAKE 30 MINUTES BEFORE BREAKFAST 90 tablet 1   No  current facility-administered medications for this visit.   No Known Allergies   Review of Systems: All systems reviewed and negative except where noted in HPI.    No results found.  Physical Exam: BP (!) 158/98   Pulse (!) 59   Ht 6' 1 (1.854 m)   Wt 173 lb 2 oz (78.5 kg)   BMI 22.84 kg/m  Constitutional: Pleasant,well-developed, African-American male in no acute distress.  Accompanied by his wife HEENT: Normocephalic and atraumatic. Conjunctivae are normal. No scleral icterus. Neck supple.  Cardiovascular: Normal rate, regular rhythm.  Pulmonary/chest: Effort normal and breath sounds normal. No wheezing, rales or rhonchi. Abdominal: Soft, nondistended, tenderness to palpation in the epigastrium without rigidity or guarding.  Bowel sounds active throughout. There are no masses palpable. No hepatomegaly. Extremities: no edema Lymphadenopathy: No cervical adenopathy noted. Neurological: Alert and oriented to person place and time. Skin: Skin is warm and dry. No rashes noted. Psychiatric: Normal mood and affect. Behavior is normal.  CBC    Component Value Date/Time   WBC 5.3 12/08/2022 1448   RBC 4.53 12/08/2022 1448   HGB 14.4 12/08/2022 1448   HGB 15.8 07/28/2022 0854   HCT 42.1 12/08/2022 1448   HCT 48.3 07/28/2022 0854   PLT 401.0 (H) 12/08/2022 1448   PLT 389 07/28/2022 0854   MCV 92.9 12/08/2022 1448   MCV 92 07/28/2022 0854   MCV 92 09/12/2012 1623   MCH 31.0 09/21/2022 1138   MCHC 34.2 12/08/2022 1448   RDW 12.9 12/08/2022 1448   RDW 12.5 07/28/2022 0854   RDW 13.4 09/12/2012 1623   LYMPHSABS 2.2 12/08/2022 1448   LYMPHSABS 1.7 07/28/2022 0854   MONOABS 0.3 12/08/2022 1448   EOSABS 0.3 12/08/2022 1448   EOSABS 0.1 07/28/2022 0854   BASOSABS 0.1 12/08/2022 1448   BASOSABS 0.1 07/28/2022 0854    CMP     Component Value Date/Time   NA 135 09/21/2022 1138   NA 138 07/28/2022 0854   NA 139 09/12/2012 1623   K 4.1 09/21/2022 1138   K 3.9 09/12/2012  1623   CL 104 09/21/2022 1138   CL 106 09/12/2012 1623   CO2 21 (L) 09/21/2022 1138   CO2 28 09/12/2012 1623   GLUCOSE 126 (H) 09/21/2022 1138   GLUCOSE 113 (H) 09/12/2012 1623   BUN 12 09/21/2022 1138   BUN 16 07/28/2022 0854   BUN 17 09/12/2012 1623   CREATININE 0.96 09/21/2022 1138   CREATININE 0.98 09/12/2012 1623   CALCIUM 9.7 09/21/2022 1138   CALCIUM 9.3 09/12/2012 1623   PROT 8.3 (H) 09/21/2022 1138   PROT 7.7 07/28/2022 0854   ALBUMIN 4.5 09/21/2022 1138   ALBUMIN 4.8 07/28/2022 0854   AST 21 09/21/2022 1138   ALT 13 09/21/2022 1138   ALKPHOS 55 09/21/2022 1138   BILITOT 1.0 09/21/2022 1138   BILITOT 0.4  07/28/2022 0854   GFRNONAA >60 09/21/2022 1138   GFRNONAA >60 09/12/2012 1623   GFRAA >60 06/22/2019 1704   GFRAA >60 09/12/2012 1623       Latest Ref Rng & Units 12/08/2022    2:48 PM 09/21/2022   11:38 AM 07/28/2022    8:54 AM  CBC EXTENDED  WBC 4.0 - 10.5 K/uL 5.3  5.8  4.3   RBC 4.22 - 5.81 Mil/uL 4.53  4.94  5.27   Hemoglobin 13.0 - 17.0 g/dL 85.5  84.6  84.1   HCT 39.0 - 52.0 % 42.1  44.8  48.3   Platelets 150.0 - 400.0 K/uL 401.0  337  389   NEUT# 1.4 - 7.7 K/uL 2.4  4.3  2.0   Lymph# 0.7 - 4.0 K/uL 2.2  1.1  1.7       ASSESSMENT AND PLAN:  53 year old male with CT evidence of chronic pancreatitis evidenced by coarse calcifications and dilated pancreatic duct in August 2023, with ongoing symptoms of abdominal pain and weight loss.  Recent fecal elastase was unremarkable.  Chronic Pancreatitis Chronic pancreatitis with persistent abdominal pain, likely secondary to chronic alcohol abuse and smoking history. Symptoms include daily abdominal pain, weight loss, and difficulty sleeping. Stool test for pancreatic insufficiency was negative, but clinical suspicion remains high. Discussed the chronic nature of the condition and the goal of reducing pain rather than achieving complete pain relief. Advised cessation of alcohol and smoking, including marijuana,  due to potential exacerbation of symptoms. Discussed potential benefits of pancreatic enzyme replacement therapy and gabapentin  for pain management. Explained that pancreatic enzyme replacement therapy may help with digestion and pain, and gabapentin  may help with nerve pain and improve sleep. Informed that if no improvement is seen after a few weeks, the medications can be discontinued. Recommended a low-fat diet with lean proteins and small, frequent meals to avoid stimulating the pancreas.  If pain not improving, or if weight loss continues, will plan to repeat CT to further assess for ductal dilation.  May refer to our biliary colleagues to see if he is a candidate for pancreatic stone extraction - Prescribe pancreatic enzyme replacement therapy (Creon  36K, 2 caps w/ meals) - Prescribe gabapentin  for pain management, to be taken at night - Recommend low-fat diet with lean proteins and small, frequent meals - Advise cessation of alcohol and smoking, including marijuana - Schedule follow-up in 6-8 weeks to assess weight and pain management - Repeat CT if patient not improving -Low suspicion for SMA as the underlying cause of his symptoms, based on the lack of significant postprandial pain and sitophobia.  Suspect chronic pancreatitis as the underlying etiology.  Gastritis H. pylori-associated gastritis with burning upper abdominal pain, possibly overlapping with pancreatitis symptoms. Previous treatment with pantoprazole  has been beneficial. Discussed the possibility of rechecking for H. pylori if symptoms persist. - Continue pantoprazole  for acid reflux and gastritis management   Constipation Intermittent constipation managed with Miralax . Reports difficulty with bowel movements and occasional feeling of being backed up. Discussed the importance of regular bowel movements and the use of Miralax  as needed. - Continue Miralax  as needed for constipation  Follow-up - Schedule follow-up appointment  in 6-8 weeks to evaluate weight gain and pain control.     Laquasha Groome E. Stacia, MD [facility]  I spent a total of 35 minutes reviewing the patient's medical record, interviewing and examining the patient, discussing her diagnosis and management of her condition going forward, and documenting in the medical record  Chandra Toribio POUR, MD

## 2023-02-10 ENCOUNTER — Telehealth: Payer: Self-pay | Admitting: Gastroenterology

## 2023-02-10 DIAGNOSIS — K860 Alcohol-induced chronic pancreatitis: Secondary | ICD-10-CM

## 2023-02-10 NOTE — Telephone Encounter (Signed)
 Patient called stating Gabapentin medication is currently not working, seeking alternative. Please advise.

## 2023-02-10 NOTE — Telephone Encounter (Signed)
 Left message for pt to call back

## 2023-02-11 NOTE — Telephone Encounter (Signed)
 Patient returned your call, please advise.

## 2023-02-12 NOTE — Telephone Encounter (Signed)
 Called and spoke with patient regarding recommendations as outlined below. Pt will stop by for labs on Monday. Pt knows to expect a call from radiology scheduling to set up his CTAP appt. Patient has been advised to try the Gabapentin  for at least a week, and let us  know how that goes. If still unable to tolerate Gabapentin  prescription can be changed. Pt verbalized understanding and had no concerns at the end of the call.   Lab order in epic.  CTAP order in epic. Secure staff message sent to radiology scheduling to contact patient to set up CT appt.

## 2023-02-12 NOTE — Telephone Encounter (Signed)
 Returned call to patient. Pt states that he started Gabapentin  Tuesday night in hopes to ease off the pain and get some sleep for work, but Gabapentin  made his symptoms worse. Pt states that the pain intensified and it lasted until Wednesday. Pt did not work on Wednesday. Pt wonders if medication is too strong or if there is something else that he could take instead? Please advise, thanks.

## 2023-02-12 NOTE — Telephone Encounter (Signed)
 Patient requesting a call from nurse, please advise.

## 2023-02-12 NOTE — Telephone Encounter (Signed)
 Stacia Glendia BRAVO, MD to Me  [facility]   02/12/23 12:34 PM Let's get a repeat CT scan (abd/pelvis with IV contrast) to see if he may have a stone in his pancreatic duct that might be amenable to endoscopic intervention.   Also repeat lipase level.  It's very unlikely that the gabapentin  made his pain worse, but very possible that it was not helpful.  We could try Lyrica next, but I would suggest we wait at least another week before giving up on gabapentin .  Sometimes it takes some time to show benefit.

## 2023-02-15 ENCOUNTER — Other Ambulatory Visit (INDEPENDENT_AMBULATORY_CARE_PROVIDER_SITE_OTHER): Payer: BC Managed Care – PPO

## 2023-02-15 DIAGNOSIS — K860 Alcohol-induced chronic pancreatitis: Secondary | ICD-10-CM | POA: Diagnosis not present

## 2023-02-15 LAB — LIPASE: Lipase: 105 U/L — ABNORMAL HIGH (ref 11.0–59.0)

## 2023-02-17 NOTE — Progress Notes (Signed)
 Evan Sullivan,  Your lipase level is still elevated, which likely indicates ongoing inflammation in the pancrease.  It is very important that you continue to completely avoid alcohol and tobacco.  As discussed, stopping marijuana will also likely help the pancreas.  Awaiting CT results.  If there is evidence of a stone causing obstruction in your pancreatic duct, this may be able to be removed with a procedure.

## 2023-02-18 ENCOUNTER — Ambulatory Visit (HOSPITAL_COMMUNITY)
Admission: RE | Admit: 2023-02-18 | Discharge: 2023-02-18 | Disposition: A | Discharge status: Home / Self Care | Payer: BC Managed Care – PPO | Source: Ambulatory Visit | Attending: Gastroenterology | Admitting: Gastroenterology

## 2023-02-18 DIAGNOSIS — K5730 Diverticulosis of large intestine without perforation or abscess without bleeding: Secondary | ICD-10-CM | POA: Diagnosis not present

## 2023-02-18 DIAGNOSIS — K860 Alcohol-induced chronic pancreatitis: Secondary | ICD-10-CM | POA: Diagnosis not present

## 2023-02-18 DIAGNOSIS — K859 Acute pancreatitis without necrosis or infection, unspecified: Secondary | ICD-10-CM | POA: Diagnosis not present

## 2023-02-18 DIAGNOSIS — K8689 Other specified diseases of pancreas: Secondary | ICD-10-CM | POA: Diagnosis not present

## 2023-02-18 DIAGNOSIS — K861 Other chronic pancreatitis: Secondary | ICD-10-CM | POA: Diagnosis not present

## 2023-02-18 MED ORDER — IOHEXOL 300 MG/ML  SOLN
100.0000 mL | Freq: Once | INTRAMUSCULAR | Status: AC | PRN
  Administered 2023-02-18: 100 mL via INTRAVENOUS

## 2023-02-24 ENCOUNTER — Ambulatory Visit: Payer: BC Managed Care – PPO | Admitting: Gastroenterology

## 2023-03-03 ENCOUNTER — Encounter: Payer: Self-pay | Admitting: Gastroenterology

## 2023-03-03 ENCOUNTER — Other Ambulatory Visit: Payer: Self-pay

## 2023-03-03 DIAGNOSIS — K8689 Other specified diseases of pancreas: Secondary | ICD-10-CM

## 2023-03-03 NOTE — Progress Notes (Signed)
Mr. Marcello, Your CT scan shows continued evidence of inflammation around the pancreas, consistent with chronic pancreatitis.  You have evidence of pancreatic duct dilation, which is likely related to chronic pancreatitis and stones within the pancreas. You may benefit from stenting of the pancreatic duct. I would like you to be seen by an advanced endoscopist at [facility] to consider performing endoscopic ultrasound with possible stenting of the pancreatic duct.  I do also want to check for a tumor marker that can be associated with pancreatic cancer, as this can also be a cause of a dilated pancreatic duct.  Please come to our lab at your convenience to have this blood test drawn. We will place a referral to [facility] endoscopy should be hearing from them soon.  Bonita Quin, Can you please place a referral to [facility] therapeutic endoscopy for endoscopic ultrasound and possible pancreatic duct stent placement for chronic pancreatitis/pancreatic duct dilation, and place an order for a CA 19-9 to be drawn at our lab?

## 2023-03-04 ENCOUNTER — Other Ambulatory Visit: Payer: BC Managed Care – PPO

## 2023-03-04 DIAGNOSIS — K8689 Other specified diseases of pancreas: Secondary | ICD-10-CM | POA: Diagnosis not present

## 2023-03-05 LAB — CANCER ANTIGEN 19-9: CA 19-9: 77 U/mL — ABNORMAL HIGH (ref <34)

## 2023-03-08 ENCOUNTER — Encounter: Payer: Self-pay | Admitting: Gastroenterology

## 2023-03-08 NOTE — Progress Notes (Signed)
Evan Sullivan,  Your CA 19-9 level was elevated.  Although many things can cause this, pancreatic cancer is one of them, and this needs to be excluded with the endoscopic ultrasound.  Have you heard anything from [facility] about an appointment yet?

## 2023-03-18 ENCOUNTER — Encounter: Payer: Self-pay | Admitting: Family Medicine

## 2023-03-25 ENCOUNTER — Other Ambulatory Visit: Payer: Self-pay | Admitting: Family Medicine

## 2023-03-25 DIAGNOSIS — E781 Pure hyperglyceridemia: Secondary | ICD-10-CM

## 2023-03-26 ENCOUNTER — Ambulatory Visit: Payer: BC Managed Care – PPO | Admitting: Gastroenterology

## 2023-03-26 ENCOUNTER — Encounter: Payer: Self-pay | Admitting: Gastroenterology

## 2023-03-26 VITALS — BP 138/88 | HR 62 | Ht 73.0 in | Wt 182.0 lb

## 2023-03-26 DIAGNOSIS — R978 Other abnormal tumor markers: Secondary | ICD-10-CM

## 2023-03-26 DIAGNOSIS — K838 Other specified diseases of biliary tract: Secondary | ICD-10-CM

## 2023-03-26 DIAGNOSIS — F1290 Cannabis use, unspecified, uncomplicated: Secondary | ICD-10-CM

## 2023-03-26 DIAGNOSIS — Z720 Tobacco use: Secondary | ICD-10-CM

## 2023-03-26 DIAGNOSIS — K860 Alcohol-induced chronic pancreatitis: Secondary | ICD-10-CM

## 2023-03-26 DIAGNOSIS — F1090 Alcohol use, unspecified, uncomplicated: Secondary | ICD-10-CM

## 2023-03-26 DIAGNOSIS — K8681 Exocrine pancreatic insufficiency: Secondary | ICD-10-CM

## 2023-03-26 NOTE — Patient Instructions (Signed)
_______________________________________________________  If your blood pressure at your visit was 140/90 or greater, please contact your primary care physician to follow up on this.  _______________________________________________________  If you are age 52 or older, your body mass index should be between 23-30. Your Body mass index is 24.01 kg/m. If this is out of the aforementioned range listed, please consider follow up with your Primary Care Provider.  If you are age 47 or younger, your body mass index should be between 19-25. Your Body mass index is 24.01 kg/m. If this is out of the aformentioned range listed, please consider follow up with your Primary Care Provider.   ________________________________________________________  The [place] [facility] providers would like to encourage you to use [facility] to communicate with providers for non-urgent requests or questions.  Due to long hold times on the telephone, sending your provider a message by [facility] may be a faster and more efficient way to get a response.  Please allow 48 business hours for a response.  Please remember that this is for non-urgent requests.  _______________________________________________________    It was a pleasure to see you today!  Thank you for trusting me with your gastrointestinal care!    Scott E.Tomasa Rand, MD

## 2023-03-26 NOTE — Progress Notes (Signed)
HPI :  Evan Sullivan is a 53 y.o. 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   He was last seen by me January 7.  At that time he was having significant abdominal pain, which had been ongoing for several months.  He had also had significant weight loss, going from 200 pounds in August 20 24 to 173 pounds at that visit.  Although his fecal elastase had been normal, he had radiographic evidence highly suggestive of chronic pancreatitis.  He was prescribed Creon as well as gabapentin to help with his abdominal pain.  His pain worsened shortly after starting these medications, so a repeat CT scan was ordered. The CT on January 24 showed mild inflammatory changes around the head of pancreas and uncinate process, with multiple cystic parenchymal lesions measuring up to 2 cm, as well as coarse parenchymal calcifications and diffuse main duct dilation up to 12 mm.  A lipase level was 105 and a CA 19-9 was elevated at 77.  It was recommended he undergo EUS and possible ERCP.  As our ability to provide these procedures was very limited, he was referred to [facility] to have these procedures done. It appears he is scheduled for an EUS with Dr. Jamse Mead next month.  Today, the patient reports that he is feeling much better.  He thinks that the Creon has been very helpful for his pain and appetite.  He thinks his appetite is about back to normal and he does not have pain after eating.  He has gained 9 pounds in the past month.  His bowel movements have been regular.  He has had decreased gas and bloating.  He has taken the gabapentin a few times, but overall does not feel like he needs it.  He is also wondering if he can decrease his Creon dosing.  He continues to abstain from alcohol and tobacco, but does continue to engage in smoking marijuana, although he has decreased to mostly on the weekends now.  He is confused because he got a call from [facility] saying  that he had a virtual appointment scheduled with him for 17 March, after his procedure.  He was told that this appointment had nothing to do with the procedure.    03/04/23 [id number]  Component Ref Range & Units (hover) 1 mo ago (02/15/23) 3 mo ago (12/08/22) 6 mo ago (09/21/22) 1 yr ago (01/16/22) 1 yr ago (06/04/21) 1 yr ago (05/26/21) 1 yr ago (05/16/21)  Lipase 105.0 High  312.0 High  662 High  R, CM 546.0 High  94 High  R 122 High  R 116 High    Fecal elastase 239 (within normal limits) (12/14/2022) Lipase 312 (12/08/2022)  CT abdomen/pelvis with contrast February 26, 2023  FINDINGS: Lower chest: No acute abnormality.   Hepatobiliary: No focal liver abnormality is seen. No gallstones, gallbladder wall thickening, or biliary dilatation.   Pancreas: Mild inflammatory changes adjacent to the head and uncinate process with multiple cystic appearing parenchymal lesions measuring up to 2 cm in the head (2:30). Coarse parenchymal calcifications with diffuse main duct dilation up to 12 mm.   Spleen: Normal in size without focal abnormality.   Adrenals/Urinary Tract: Adrenal glands are unremarkable. Kidneys are normal, without renal calculi, focal lesion, or hydronephrosis. Bladder is unremarkable.   Stomach/Bowel: Stomach is within normal limits. Appendix appears normal. Reactive inflammatory changes involve the distal duodenum. No bowel obstruction. Scattered colonic diverticulosis.  Vascular/Lymphatic: Aortic atherosclerosis. No enlarged abdominal or pelvic lymph nodes. 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 Patent SMA and porto-splenic vasculature.   Reproductive: Prostate is unremarkable.   Other: No abdominal wall hernia or abnormality. No abdominopelvic ascites.   Musculoskeletal: No acute or significant osseous findings. Multilevel degenerative changes. Right hip total arthroplasty.  IMPRESSION: Acute on chronic pancreatitis with chronic sequela  including multiple new pancreatic pseudocysts and persistent main duct dilation     CT abdomen/pelvis August 2024 IMPRESSION: 1. Acute on chronic pancreatitis. 2. Hepatic steatosis     EGD 07/16/2021: - Normal esophagus. - Gastroesophageal flap valve classified as Hill Grade IV (no fold, wide open lumen, hiatal hernia present). - Gastritis with hemorrhage.  Localized severe inflammation with hemorrhage characterized by adherent blood, erythema and shallow ulcerations was found in the gastric antrum. Biopsies were taken with a cold forceps for Helicobacter pylori testing. Estimated blood loss was minimal.Biopsied. This is the likely source of the patient's iron deficiency anemia. - Duodenitis. - Localized severe inflammation characterized by congestion (edema), erythema and shallow ulcerations was found in the duodenal bulb and in the second portion of the duodenum. Biopsies were taken with a cold forceps for histology. Estimated blood loss was minimal.  Colonoscopy 07/16/2021: - One 6 mm polyp in the ascending colon, removed with a cold snare. Resected and retrieved. - Two 3 to 5 mm polyps in the descending colon, removed retrieved. - Diverticulosis in the sigmoid colon, in the transverse colon and in the ascending colon. - Non-bleeding internal hemorrhoids. - 7 year recall colonoscopy  1. Surgical [P], duodenal biopsies   ACUTE DUODENITIS 2. 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 Surgical [P], colon, ascending and descending, polyp (3) TUBULAR ADENOMA HYPERPLASTIC POLYP NEGATIVE FOR HIGH-GRADE DYSPLASIA AND CARCINOMA   Colonoscopy 11/30/2019: - One 15 mm polyp in the ascending colon, removed with a hot snare. Resected and  retrieved. Clip (MR conditional) was placed. - Two 3 to 5 mm polyps in the rectum, removed with a cold snare. Resected and retrieved. - The examination was otherwise normal. - The distal rectum and anal verge are normal on retroflexion view. A. COLON POLYP, ASCENDING; HOT SNARE:  - COLONIC MUCOSA WITH SUBMUCOSAL LIPOMA.  - NEGATIVE FOR DYSPLASIA AND MALIGNANCY.  B.  RECTUM POLYP; COLD SNARE:  - HYPERPLASTIC POLYP.  - NEGATIVE FOR DYSPLASIA AND MALIGNANCY.   Past Medical History:  Diagnosis Date   Anemia    Arthritis    Avascular necrosis of femoral head, right (HCC) 2019   Diet-controlled type 2 diabetes mellitus (HCC) 09/07/2021   GERD (gastroesophageal reflux disease)    Gout    Hypertension    Peptic ulcer disease      Past Surgical History:  Procedure Laterality Date   COLONOSCOPY     COLONOSCOPY WITH PROPOFOL N/A 11/30/2019   Procedure: COLONOSCOPY WITH PROPOFOL;  Surgeon: Toney Reil, MD;  Location: [facility];  Service: Endoscopy;  Laterality: N/A;  priority 4   KNEE ARTHROSCOPY, MEDIAL PATELLO FEMORAL LIGAMENT REPAIR Bilateral 2004,2000   POLYPECTOMY  11/30/2019   Procedure: POLYPECTOMY;  Surgeon: Toney Reil, MD;  Location: [facility];  Service: Endoscopy;;   TOTAL HIP ARTHROPLASTY Right 08/02/2017   Procedure: TOTAL HIP ARTHROPLASTY;  Surgeon: Donato Heinz, MD;  Location: [facility] ORS;  Service: Orthopedics;  Laterality: Right;   Family History  Problem Relation Age of Onset   Hypertension Mother    Hypertension Father    Colon cancer Father 65   Diabetes Father    Pancreatic cancer Neg Hx    Esophageal cancer Neg Hx    Liver cancer Neg Hx    Stomach cancer Neg Hx    Social History   Tobacco Use   Smoking status: Never   Smokeless tobacco: Never  Vaping Use   Vaping status: Never Used  Substance Use Topics   Alcohol use: Not Currently   Drug use: Yes    Types: Marijuana    Comment: daily    Current Outpatient  Medications  Medication Sig Dispense Refill   amLODipine (NORVASC) 10 MG tablet TAKE 1 TABLET BY MOUTH EVERY DAY 90 tablet 1   atenolol (TENORMIN) 100 MG tablet TAKE 1 TABLET BY MOUTH EVERY DAY 90 tablet 1   colchicine 0.6 MG tablet Take 1 capsule by mouth daily as needed (gout pain) 30 tablet 1   fenofibrate (TRICOR) 48 MG tablet TAKE 1 TABLET BY MOUTH EVERY DAY 90 tablet 0   gabapentin (NEURONTIN) 300 MG capsule Take 1 capsule (300 mg total) by mouth at bedtime. 30 capsule 1   hydrocortisone cream 1 % Apply 1 application  topically daily as needed (rash).     lipase/protease/amylase (CREON) 36000 UNITS CPEP capsule Take 2 capsules (72,000 Units total) by mouth with breakfast, with lunch, and with evening meal. 180 capsule 1   pantoprazole (PROTONIX) 40 MG tablet TAKE 1 TABLET (40 MG TOTAL) BY MOUTH DAILY TAKE 30 MINUTES BEFORE BREAKFAST 90 tablet 1   No current facility-administered medications for this visit.   No Known Allergies   Review of Systems: All systems reviewed and negative except where noted in HPI.    No results found.  Physical Exam: BP 138/88   Pulse 62   Ht 6\' 1"  (1.854 m)   Wt 182 lb (82.6 kg)   BMI 24.01 kg/m  Constitutional: Pleasant,well-developed, African-American male in no acute distress. HEENT: Normocephalic and atraumatic. Conjunctivae are normal. No scleral icterus. Neurological: Alert and oriented to person place and time. Skin: Skin is warm and dry. No rashes noted. Psychiatric: Normal mood and affect. Behavior is normal.  CBC    Component Value Date/Time   WBC 5.3 12/08/2022 1448   RBC 4.53 12/08/2022 1448   HGB 14.4 12/08/2022 1448   HGB 15.8 07/28/2022 0854   HCT 42.1 12/08/2022 1448   HCT 48.3 07/28/2022 0854   PLT 401.0 (H) 12/08/2022 1448   PLT 389 07/28/2022 0854   MCV 92.9 12/08/2022 1448   MCV 92 07/28/2022 0854   MCV 92 09/12/2012 1623   MCH 31.0 09/21/2022 1138   MCHC 34.2 12/08/2022 1448   RDW 12.9 12/08/2022 1448   RDW  12.5 07/28/2022 0854   RDW 13.4 09/12/2012 1623   LYMPHSABS 2.2 12/08/2022 1448   LYMPHSABS 1.7 07/28/2022 0854   MONOABS 0.3 12/08/2022 1448   EOSABS 0.3 12/08/2022 1448   EOSABS 0.1 07/28/2022 0854   BASOSABS 0.1 12/08/2022 1448   BASOSABS 0.1 07/28/2022 0854    CMP     Component Value Date/Time   NA 135 09/21/2022 1138   NA 138 07/28/2022 0854   NA 139 09/12/2012 1623   K 4.1 09/21/2022 1138   K 3.9 09/12/2012 1623   CL 104 09/21/2022 1138   CL 106 09/12/2012 1623   CO2 21 (L)  09/21/2022 1138   CO2 28 09/12/2012 1623   GLUCOSE 126 (H) 09/21/2022 1138   GLUCOSE 113 (H) 09/12/2012 1623   BUN 12 09/21/2022 1138   BUN 16 07/28/2022 0854   BUN 17 09/12/2012 1623   CREATININE 0.96 09/21/2022 1138   CREATININE 0.98 09/12/2012 1623   CALCIUM 9.7 09/21/2022 1138   CALCIUM 9.3 09/12/2012 1623   PROT 8.3 (H) 09/21/2022 1138   PROT 7.7 07/28/2022 0854   ALBUMIN 4.5 09/21/2022 1138   ALBUMIN 4.8 07/28/2022 0854   AST 21 09/21/2022 1138   ALT 13 09/21/2022 1138   ALKPHOS 55 09/21/2022 1138   BILITOT 1.0 09/21/2022 1138   BILITOT 0.4 07/28/2022 0854   GFRNONAA >60 09/21/2022 1138   GFRNONAA >60 09/12/2012 1623   GFRAA >60 06/22/2019 1704   GFRAA >60 09/12/2012 1623       Latest Ref Rng & Units 12/08/2022    2:48 PM 09/21/2022   11:38 AM 07/28/2022    8:54 AM  CBC EXTENDED  WBC 4.0 - 10.5 K/uL 5.3  5.8  4.3   RBC 4.22 - 5.81 Mil/uL 4.53  4.94  5.27   Hemoglobin 13.0 - 17.0 g/dL 16.1  09.6  04.5   HCT 39.0 - 52.0 % 42.1  44.8  48.3   Platelets 150.0 - 400.0 K/uL 401.0  337  389   NEUT# 1.4 - 7.7 K/uL 2.4  4.3  2.0   Lymph# 0.7 - 4.0 K/uL 2.2  1.1  1.7       ASSESSMENT AND PLAN:  53 year old male with chronic pancreatitis secondary to alcohol/tobacco, with markedly dilated common bile duct, calcifications and cystic lesions, with mildly elevated CA 19-9.  He had had significant unintentional weight loss, but has regained some this weight with initiation of pancreatic  enzyme replacement therapy.  He is scheduled for an endoscopic ultrasound with Dr. Edyth Gunnels on March 10 to exclude pancreatic cancer, and determine if there is any role for endoscopic therapy of his chronic pancreatitis. Discussed increased pancreatic cancer risk due to chronic inflammation and smoking history, and the importance of indefinitely avoiding any alcohol and tobacco consumption going forward.  Although I am not certain there is a causative role between marijuana use and chronic pancreatitis, I encouraged him to try to limit or stop marijuana usage, or to consider alternatives to use smoking marijuana such as edibles.  I told him I am okay with him adjusting the Creon dose to 1 capsule/ meal, as his elastase was normal and he does not have stearrhea.  If he notices his pain worsens with decreased dose, he can increase back to 2 capsules per meal  - Continue Creon, adjust dose to one or two capsules with meals as needed -Proceed with EUS with Dr. Edyth Gunnels at [facility] March 10; defer to him regarding any role for endoscopic therapy -Okay to use gabapentin as needed, given significant improvement in pain -Continue to completely avoid alcohol and tobacco -Recommend stopping/limiting marijuana use, or at least considering alternative such as edibles rather than marijuana cigarettes       Sandre Kitty, MD

## 2023-04-12 DIAGNOSIS — I899 Noninfective disorder of lymphatic vessels and lymph nodes, unspecified: Secondary | ICD-10-CM | POA: Diagnosis not present

## 2023-04-12 DIAGNOSIS — I10 Essential (primary) hypertension: Secondary | ICD-10-CM | POA: Diagnosis not present

## 2023-04-12 DIAGNOSIS — K8689 Other specified diseases of pancreas: Secondary | ICD-10-CM | POA: Diagnosis not present

## 2023-04-12 DIAGNOSIS — K861 Other chronic pancreatitis: Secondary | ICD-10-CM | POA: Diagnosis not present

## 2023-04-12 DIAGNOSIS — Z79899 Other long term (current) drug therapy: Secondary | ICD-10-CM | POA: Diagnosis not present

## 2023-04-12 DIAGNOSIS — Z800 Family history of malignant neoplasm of digestive organs: Secondary | ICD-10-CM | POA: Diagnosis not present

## 2023-04-12 DIAGNOSIS — R933 Abnormal findings on diagnostic imaging of other parts of digestive tract: Secondary | ICD-10-CM | POA: Diagnosis not present

## 2023-04-13 ENCOUNTER — Other Ambulatory Visit: Payer: Self-pay | Admitting: Gastroenterology

## 2023-04-13 ENCOUNTER — Other Ambulatory Visit: Payer: Self-pay | Admitting: Nurse Practitioner

## 2023-04-17 ENCOUNTER — Other Ambulatory Visit: Payer: Self-pay | Admitting: Family Medicine

## 2023-04-17 DIAGNOSIS — I10 Essential (primary) hypertension: Secondary | ICD-10-CM

## 2023-05-01 ENCOUNTER — Encounter: Payer: Self-pay | Admitting: Gastroenterology

## 2023-05-03 ENCOUNTER — Inpatient Hospital Stay (HOSPITAL_COMMUNITY)
Admission: EM | Admit: 2023-05-03 | Discharge: 2023-05-06 | DRG: 919 | Disposition: A | Discharge status: Home / Self Care | Attending: Internal Medicine | Admitting: Internal Medicine

## 2023-05-03 ENCOUNTER — Encounter (HOSPITAL_COMMUNITY): Payer: Self-pay

## 2023-05-03 ENCOUNTER — Other Ambulatory Visit: Payer: Self-pay

## 2023-05-03 ENCOUNTER — Emergency Department (HOSPITAL_COMMUNITY)

## 2023-05-03 ENCOUNTER — Inpatient Hospital Stay (HOSPITAL_COMMUNITY)

## 2023-05-03 DIAGNOSIS — R935 Abnormal findings on diagnostic imaging of other abdominal regions, including retroperitoneum: Secondary | ICD-10-CM

## 2023-05-03 DIAGNOSIS — K858 Other acute pancreatitis without necrosis or infection: Secondary | ICD-10-CM | POA: Diagnosis not present

## 2023-05-03 DIAGNOSIS — R1012 Left upper quadrant pain: Secondary | ICD-10-CM | POA: Diagnosis not present

## 2023-05-03 DIAGNOSIS — Z6824 Body mass index (BMI) 24.0-24.9, adult: Secondary | ICD-10-CM

## 2023-05-03 DIAGNOSIS — E441 Mild protein-calorie malnutrition: Secondary | ICD-10-CM

## 2023-05-03 DIAGNOSIS — Z79899 Other long term (current) drug therapy: Secondary | ICD-10-CM | POA: Diagnosis not present

## 2023-05-03 DIAGNOSIS — E440 Moderate protein-calorie malnutrition: Secondary | ICD-10-CM | POA: Diagnosis present

## 2023-05-03 DIAGNOSIS — I10 Essential (primary) hypertension: Secondary | ICD-10-CM | POA: Diagnosis present

## 2023-05-03 DIAGNOSIS — I444 Left anterior fascicular block: Secondary | ICD-10-CM | POA: Diagnosis present

## 2023-05-03 DIAGNOSIS — M1990 Unspecified osteoarthritis, unspecified site: Secondary | ICD-10-CM | POA: Diagnosis not present

## 2023-05-03 DIAGNOSIS — K8689 Other specified diseases of pancreas: Secondary | ICD-10-CM | POA: Diagnosis not present

## 2023-05-03 DIAGNOSIS — R1010 Upper abdominal pain, unspecified: Secondary | ICD-10-CM | POA: Diagnosis present

## 2023-05-03 DIAGNOSIS — K8681 Exocrine pancreatic insufficiency: Secondary | ICD-10-CM | POA: Diagnosis not present

## 2023-05-03 DIAGNOSIS — K863 Pseudocyst of pancreas: Secondary | ICD-10-CM | POA: Diagnosis not present

## 2023-05-03 DIAGNOSIS — Z860100 Personal history of colon polyps, unspecified: Secondary | ICD-10-CM

## 2023-05-03 DIAGNOSIS — E876 Hypokalemia: Secondary | ICD-10-CM | POA: Diagnosis present

## 2023-05-03 DIAGNOSIS — R1013 Epigastric pain: Secondary | ICD-10-CM | POA: Diagnosis not present

## 2023-05-03 DIAGNOSIS — E1169 Type 2 diabetes mellitus with other specified complication: Secondary | ICD-10-CM | POA: Diagnosis present

## 2023-05-03 DIAGNOSIS — R188 Other ascites: Secondary | ICD-10-CM | POA: Diagnosis not present

## 2023-05-03 DIAGNOSIS — K861 Other chronic pancreatitis: Secondary | ICD-10-CM | POA: Diagnosis not present

## 2023-05-03 DIAGNOSIS — E860 Dehydration: Secondary | ICD-10-CM | POA: Diagnosis not present

## 2023-05-03 DIAGNOSIS — F1210 Cannabis abuse, uncomplicated: Secondary | ICD-10-CM | POA: Diagnosis not present

## 2023-05-03 DIAGNOSIS — K8591 Acute pancreatitis with uninfected necrosis, unspecified: Secondary | ICD-10-CM | POA: Diagnosis not present

## 2023-05-03 DIAGNOSIS — K860 Alcohol-induced chronic pancreatitis: Secondary | ICD-10-CM | POA: Diagnosis present

## 2023-05-03 DIAGNOSIS — Z96641 Presence of right artificial hip joint: Secondary | ICD-10-CM | POA: Diagnosis present

## 2023-05-03 DIAGNOSIS — Z8249 Family history of ischemic heart disease and other diseases of the circulatory system: Secondary | ICD-10-CM | POA: Diagnosis not present

## 2023-05-03 DIAGNOSIS — Z8619 Personal history of other infectious and parasitic diseases: Secondary | ICD-10-CM

## 2023-05-03 DIAGNOSIS — K828 Other specified diseases of gallbladder: Secondary | ICD-10-CM | POA: Diagnosis not present

## 2023-05-03 DIAGNOSIS — E782 Mixed hyperlipidemia: Secondary | ICD-10-CM | POA: Insufficient documentation

## 2023-05-03 DIAGNOSIS — E871 Hypo-osmolality and hyponatremia: Secondary | ICD-10-CM | POA: Diagnosis present

## 2023-05-03 DIAGNOSIS — M109 Gout, unspecified: Secondary | ICD-10-CM | POA: Diagnosis present

## 2023-05-03 DIAGNOSIS — K838 Other specified diseases of biliary tract: Secondary | ICD-10-CM | POA: Diagnosis not present

## 2023-05-03 DIAGNOSIS — K831 Obstruction of bile duct: Secondary | ICD-10-CM | POA: Diagnosis not present

## 2023-05-03 DIAGNOSIS — Z833 Family history of diabetes mellitus: Secondary | ICD-10-CM

## 2023-05-03 DIAGNOSIS — K219 Gastro-esophageal reflux disease without esophagitis: Secondary | ICD-10-CM | POA: Diagnosis present

## 2023-05-03 DIAGNOSIS — K9189 Other postprocedural complications and disorders of digestive system: Secondary | ICD-10-CM

## 2023-05-03 DIAGNOSIS — Z800 Family history of malignant neoplasm of digestive organs: Secondary | ICD-10-CM

## 2023-05-03 DIAGNOSIS — R7989 Other specified abnormal findings of blood chemistry: Secondary | ICD-10-CM | POA: Diagnosis not present

## 2023-05-03 DIAGNOSIS — E119 Type 2 diabetes mellitus without complications: Secondary | ICD-10-CM

## 2023-05-03 DIAGNOSIS — T8189XA Other complications of procedures, not elsewhere classified, initial encounter: Secondary | ICD-10-CM | POA: Diagnosis not present

## 2023-05-03 DIAGNOSIS — G8929 Other chronic pain: Secondary | ICD-10-CM | POA: Diagnosis present

## 2023-05-03 DIAGNOSIS — K859 Acute pancreatitis without necrosis or infection, unspecified: Secondary | ICD-10-CM | POA: Diagnosis not present

## 2023-05-03 DIAGNOSIS — Z8711 Personal history of peptic ulcer disease: Secondary | ICD-10-CM

## 2023-05-03 DIAGNOSIS — F1090 Alcohol use, unspecified, uncomplicated: Secondary | ICD-10-CM

## 2023-05-03 DIAGNOSIS — D649 Anemia, unspecified: Secondary | ICD-10-CM | POA: Diagnosis not present

## 2023-05-03 LAB — COMPREHENSIVE METABOLIC PANEL WITH GFR
ALT: 243 U/L — ABNORMAL HIGH (ref 0–44)
AST: 157 U/L — ABNORMAL HIGH (ref 15–41)
Albumin: 3.5 g/dL (ref 3.5–5.0)
Alkaline Phosphatase: 265 U/L — ABNORMAL HIGH (ref 38–126)
Anion gap: 10 (ref 5–15)
BUN: 10 mg/dL (ref 6–20)
CO2: 26 mmol/L (ref 22–32)
Calcium: 9.4 mg/dL (ref 8.9–10.3)
Chloride: 98 mmol/L (ref 98–111)
Creatinine, Ser: 0.78 mg/dL (ref 0.61–1.24)
GFR, Estimated: >60 mL/min (ref >60)
Glucose, Bld: 157 mg/dL — ABNORMAL HIGH (ref 70–99)
Potassium: 3.3 mmol/L — ABNORMAL LOW (ref 3.5–5.1)
Sodium: 134 mmol/L — ABNORMAL LOW (ref 135–145)
Total Bilirubin: 5.6 mg/dL — ABNORMAL HIGH (ref 0.0–1.2)
Total Protein: 7.2 g/dL (ref 6.5–8.1)

## 2023-05-03 LAB — URINALYSIS, ROUTINE W REFLEX MICROSCOPIC: Bacteria, UA: NONE SEEN

## 2023-05-03 LAB — HIV ANTIBODY (ROUTINE TESTING W REFLEX): HIV Screen 4th Generation wRfx: NONREACTIVE

## 2023-05-03 LAB — CBC
HCT: 44.7 % (ref 39.0–52.0)
Hemoglobin: 15.1 g/dL (ref 13.0–17.0)
MCH: 30.8 pg (ref 26.0–34.0)
MCHC: 33.8 g/dL (ref 30.0–36.0)
MCV: 91 fL (ref 80.0–100.0)
Platelets: 337 10*3/uL (ref 150–400)
RBC: 4.91 MIL/uL (ref 4.22–5.81)
RDW: 12.3 % (ref 11.5–15.5)
WBC: 5.1 10*3/uL (ref 4.0–10.5)
nRBC: 0 % (ref 0.0–0.2)

## 2023-05-03 LAB — LIPASE, BLOOD: Lipase: 435 U/L — ABNORMAL HIGH (ref 11–51)

## 2023-05-03 LAB — MAGNESIUM: Magnesium: 1.8 mg/dL (ref 1.7–2.4)

## 2023-05-03 LAB — TROPONIN I (HIGH SENSITIVITY)
Troponin I (High Sensitivity): 16 ng/L (ref <18)
Troponin I (High Sensitivity): 20 ng/L — ABNORMAL HIGH (ref <18)

## 2023-05-03 MED ORDER — FENOFIBRATE 54 MG PO TABS
54.0000 mg | ORAL_TABLET | Freq: Every day | ORAL | Status: DC
  Administered 2023-05-04 – 2023-05-06 (×3): 54 mg via ORAL
  Filled 2023-05-03 (×3): qty 1

## 2023-05-03 MED ORDER — FENTANYL CITRATE PF 50 MCG/ML IJ SOSY
50.0000 ug | PREFILLED_SYRINGE | Freq: Once | INTRAMUSCULAR | Status: AC
  Administered 2023-05-03: 50 ug via INTRAVENOUS
  Filled 2023-05-03: qty 1

## 2023-05-03 MED ORDER — IBUPROFEN 200 MG PO TABS: 400.0000 mg | ORAL_TABLET | Freq: Four times a day (QID) | ORAL | Status: DC | PRN

## 2023-05-03 MED ORDER — GADOBUTROL 1 MMOL/ML IV SOLN
8.0000 mL | Freq: Once | INTRAVENOUS | Status: AC | PRN
  Administered 2023-05-03: 8 mL via INTRAVENOUS

## 2023-05-03 MED ORDER — ONDANSETRON HCL 4 MG PO TABS: 4.0000 mg | ORAL_TABLET | Freq: Four times a day (QID) | ORAL | Status: DC | PRN

## 2023-05-03 MED ORDER — ENOXAPARIN SODIUM 40 MG/0.4ML IJ SOSY
40.0000 mg | PREFILLED_SYRINGE | INTRAMUSCULAR | Status: DC
  Filled 2023-05-03 (×3): qty 0.4

## 2023-05-03 MED ORDER — AMLODIPINE BESYLATE 10 MG PO TABS
10.0000 mg | ORAL_TABLET | Freq: Every day | ORAL | Status: DC
  Administered 2023-05-04 – 2023-05-06 (×3): 10 mg via ORAL
  Filled 2023-05-03 (×3): qty 1

## 2023-05-03 MED ORDER — ONDANSETRON HCL 4 MG/2ML IJ SOLN
4.0000 mg | Freq: Four times a day (QID) | INTRAMUSCULAR | Status: DC | PRN
Start: 2023-05-03 — End: 2023-05-06

## 2023-05-03 MED ORDER — IOHEXOL 300 MG/ML  SOLN
100.0000 mL | Freq: Once | INTRAMUSCULAR | Status: AC | PRN
  Administered 2023-05-03: 100 mL via INTRAVENOUS

## 2023-05-03 MED ORDER — OXYCODONE HCL 5 MG PO TABS
5.0000 mg | ORAL_TABLET | ORAL | Status: DC | PRN
  Administered 2023-05-03 – 2023-05-06 (×7): 5 mg via ORAL
  Filled 2023-05-03 (×7): qty 1

## 2023-05-03 MED ORDER — HYDROMORPHONE HCL 1 MG/ML IJ SOLN
0.5000 mg | INTRAMUSCULAR | Status: DC | PRN
  Administered 2023-05-04 – 2023-05-06 (×2): 1 mg via INTRAVENOUS
  Filled 2023-05-03 (×3): qty 1

## 2023-05-03 MED ORDER — SODIUM CHLORIDE 0.9 % IV SOLN: INTRAVENOUS | Status: AC

## 2023-05-03 MED ORDER — LACTATED RINGERS IV BOLUS
1000.0000 mL | Freq: Once | INTRAVENOUS | Status: AC
  Administered 2023-05-03: 1000 mL via INTRAVENOUS

## 2023-05-03 MED ORDER — PANTOPRAZOLE SODIUM 40 MG PO TBEC
40.0000 mg | DELAYED_RELEASE_TABLET | Freq: Every day | ORAL | Status: DC
  Administered 2023-05-03 – 2023-05-06 (×4): 40 mg via ORAL
  Filled 2023-05-03 (×4): qty 1

## 2023-05-03 MED ORDER — ATENOLOL 25 MG PO TABS
100.0000 mg | ORAL_TABLET | Freq: Every day | ORAL | Status: DC
Start: 2023-05-04 — End: 2023-05-06
  Administered 2023-05-04 – 2023-05-06 (×3): 100 mg via ORAL
  Filled 2023-05-03 (×3): qty 4

## 2023-05-03 MED ORDER — ALBUTEROL SULFATE (2.5 MG/3ML) 0.083% IN NEBU: 2.5000 mg | INHALATION_SOLUTION | RESPIRATORY_TRACT | Status: DC | PRN

## 2023-05-03 MED ORDER — LACTATED RINGERS IV SOLN: INTRAVENOUS | Status: DC

## 2023-05-03 NOTE — Plan of Care (Signed)

## 2023-05-03 NOTE — ED Notes (Signed)
 Provided pt with urinal

## 2023-05-03 NOTE — ED Triage Notes (Signed)
 Patient has had left upper quadrant abdominal pain since last Wednesday. Had endoscopy that day and was told he has pancreatitis. Has been vomiting. No diarrhea. Has dark urine. No appetite.

## 2023-05-03 NOTE — ED Notes (Signed)
UA sent down to lab.

## 2023-05-03 NOTE — H&P (Signed)
 History and Physical  Evan Sullivan GLO:[id number] DOB: 09-22-1970 DOA: 05/03/2023  PCP: Sandre Kitty, MD   Chief Complaint: Abdominal pain, nausea  HPI: Evan Sullivan is a 53 y.o. male with medical history significant for polysubstance abuse, alcoholism and evidence of chronic pancreatitis who underwent EUS and ERCP on 04/12/2023 at [facility] and is now being admitted to the hospital with acute pancreatitis.  Patient underwent the procedure without complication on 3/10, says that afterwards he was having some abdominal pain and vague nausea which seem to be intermittent.  Today the pain was worse, so he came to the ER for evaluation, workup as detailed below shows evidence of acute pancreatitis.  ER provider started the patient on fluids and pain management, discussed with [facility] who will see the patient in consultation.  Review of Systems: Please see HPI for pertinent positives and negatives. A complete 10 system review of systems are otherwise negative.  Past Medical History:  Diagnosis Date   Anemia    Arthritis    Avascular necrosis of femoral head, right (HCC) 2019   Diet-controlled type 2 diabetes mellitus (HCC) 09/07/2021   GERD (gastroesophageal reflux disease)    Gout    Hypertension    Peptic ulcer disease    Past Surgical History:  Procedure Laterality Date   COLONOSCOPY     COLONOSCOPY WITH PROPOFOL N/A 11/30/2019   Procedure: COLONOSCOPY WITH PROPOFOL;  Surgeon: Toney Reil, MD;  Location: [facility];  Service: Endoscopy;  Laterality: N/A;  priority 4   KNEE ARTHROSCOPY, MEDIAL PATELLO FEMORAL LIGAMENT REPAIR Bilateral 2004,2000   POLYPECTOMY  11/30/2019   Procedure: POLYPECTOMY;  Surgeon: Toney Reil, MD;  Location: [facility];  Service: Endoscopy;;   TOTAL HIP ARTHROPLASTY Right 08/02/2017   Procedure: TOTAL HIP ARTHROPLASTY;  Surgeon: Donato Heinz, MD;  Location: [facility] ORS;  Service: Orthopedics;  Laterality: Right;    Social History:  reports that he has never smoked. He has never used smokeless tobacco. He reports that he does not currently use alcohol. He reports current drug use. Drug: Marijuana.  No Known Allergies  Family History  Problem Relation Age of Onset   Hypertension Mother    Hypertension Father    Colon cancer Father 70   Diabetes Father    Pancreatic cancer Neg Hx    Esophageal cancer Neg Hx    Liver cancer Neg Hx    Stomach cancer Neg Hx      Prior to Admission medications   Medication Sig Start Date End Date Taking? Authorizing Provider  amLODipine (NORVASC) 10 MG tablet TAKE 1 TABLET BY MOUTH EVERY DAY 04/19/23  Yes Sandre Kitty, MD  atenolol (TENORMIN) 100 MG tablet TAKE 1 TABLET BY MOUTH EVERY DAY 04/19/23  Yes Sandre Kitty, MD  colchicine 0.6 MG tablet Take 1 capsule by mouth daily as needed (gout pain) 06/26/20  Yes McDonough, Lauren K, PA-C  CREON 36000-114000 units CPEP capsule TAKE 2 CAPSULES (72,000 UNITS TOTAL) BY MOUTH WITH BREAKFAST, WITH LUNCH, AND WITH EVENING MEAL. 04/14/23  Yes Jenel Lucks, MD  diphenhydramine-acetaminophen (TYLENOL PM) 25-500 MG TABS tablet Take 2 tablets by mouth at bedtime as needed (Sleep/Pain).   Yes [provider]  fenofibrate (TRICOR) 48 MG tablet TAKE 1 TABLET BY MOUTH EVERY DAY 03/25/23  Yes Sandre Kitty, MD  hydrocortisone cream 1 % Apply 1 application  topically daily as needed (rash).   Yes [provider]  pantoprazole (PROTONIX) 40  MG tablet TAKE 1 TABLET (40 MG TOTAL) BY MOUTH DAILY TAKE 30 MINUTES BEFORE BREAKFAST Patient taking differently: Take 40 mg by mouth daily as needed (Indisgestion). 04/15/23  Yes Arnaldo Natal, NP    Physical Exam: BP (!) 171/97   Pulse 70   Temp 98.1 F (36.7 C)   Resp 18   Ht 6\' 1"  (1.854 m)   Wt 83 kg   SpO2 100%   BMI 24.14 kg/m  General:  Alert, oriented, calm, in no acute distress, looks relatively comfortable and nontoxic Cardiovascular: RRR, no  murmurs or rubs, no peripheral edema  Respiratory: clear to auscultation bilaterally, no wheezes, no crackles  Abdomen: soft, diffusely tender tender, minimally distended, normal bowel tones heard  Skin: dry, no rashes  Musculoskeletal: no joint effusions, normal range of motion  Psychiatric: appropriate affect, normal speech  Neurologic: extraocular muscles intact, clear speech, moving all extremities with intact sensorium         Labs on Admission:  Basic Metabolic Panel: Recent Labs  Lab 05/03/23 0850 05/03/23 0905  NA 134*  --   K 3.3*  --   CL 98  --   CO2 26  --   GLUCOSE 157*  --   BUN 10  --   CREATININE 0.78  --   CALCIUM 9.4  --   MG  --  1.8   Liver Function Tests: Recent Labs  Lab 05/03/23 0850  AST 157*  ALT 243*  ALKPHOS 265*  BILITOT 5.6*  PROT 7.2  ALBUMIN 3.5   Recent Labs  Lab 05/03/23 0850  LIPASE 435*   No results for input(s): "AMMONIA" in the last 168 hours. CBC: Recent Labs  Lab 05/03/23 0850  WBC 5.1  HGB 15.1  HCT 44.7  MCV 91.0  PLT 337   Cardiac Enzymes: No results for input(s): "CKTOTAL", "CKMB", "CKMBINDEX", "TROPONINI" in the last 168 hours. BNP (last 3 results) No results for input(s): "BNP" in the last 8760 hours.  ProBNP (last 3 results) No results for input(s): "PROBNP" in the last 8760 hours.  CBG: No results for input(s): "GLUCAP" in the last 168 hours.  Radiological Exams on Admission: CT ABDOMEN PELVIS W CONTRAST Result Date: 05/03/2023 CLINICAL DATA:  Abdominal pain. EXAM: CT ABDOMEN AND PELVIS WITH CONTRAST TECHNIQUE: Multidetector CT imaging of the abdomen and pelvis was performed using the standard protocol following bolus administration of intravenous contrast. RADIATION DOSE REDUCTION: This exam was performed according to the departmental dose-optimization program which includes automated exposure control, adjustment of the mA and/or kV according to patient size and/or use of iterative reconstruction  technique. CONTRAST:  OMNIPAQUE IOHEXOL 300 MG/ML  SOLN COMPARISON:  CT abdomen pelvis dated 02/18/2023. FINDINGS: Lower chest: The visualized lung bases are clear. No intra-abdominal free air.  Small free fluid in the pelvis. Hepatobiliary: The liver is unremarkable. Mild biliary dilatation. The gallbladder is distended. No calcified gallstone. Pancreas: There is inflammatory changes of the pancreas. Enlargement of the proximal pancreas predominantly involving the head and uncinate process with dilatation of the main pancreatic duct. Coarse calcifications of the proximal pancreas sequela of chronic pancreatitis. Similar appearance of small cystic structures in the head and uncinate process of the pancreas, likely pseudocysts. Spleen: Normal in size without focal abnormality. Adrenals/Urinary Tract: The adrenal glands are unremarkable. The kidneys, visualized ureters, and urinary bladder appear unremarkable. Stomach/Bowel: There is no bowel obstruction or active inflammation. The appendix is normal. Vascular/Lymphatic: Mild aortoiliac atherosclerotic disease. The IVC is unremarkable. No  portal venous gas. There is compression of the main portal vein by the inflammatory changes of the pancreas. No adenopathy. Reproductive: The prostate and seminal vesicles are grossly remarkable. Other: Intramuscular lipoma of the left groin. Musculoskeletal: Total right hip arthroplasty. No acute osseous pathology. IMPRESSION: 1. Acute on chronic pancreatitis. Similar small pseudocyst in the uncinate process and head of the pancreas. 2. No bowel obstruction. Normal appendix. Electronically Signed   By: Elgie Collard M.D.   On: 05/03/2023 13:30   Assessment/Plan Evan Sullivan is a 53 y.o. male with medical history significant for polysubstance abuse, alcoholism and evidence of chronic pancreatitis who underwent EUS and ERCP on 04/12/2023 at [facility] and is now being admitted to the hospital with acute pancreatitis.   Acute  pancreatitis in the setting of known chronic pancreatitis-although his ERCP was almost 20 days ago, he has had intermittent vomiting and abdominal pain since that time.  There is a good chance this is a post ERCP pancreatitis.  ER provider has discussed with [facility].  The patient is hemodynamically stable and appears comfortable at this moment. -Inpatient admission -N.p.o. except for ice chips and meds -IV fluids -Pain and nausea medication as needed -[facility] has been consulted -MRCP is pending -Will resume Creon when eating  Hypertension-continue home Norvasc, atenolol  DVT prophylaxis: Lovenox     Code Status: Full Code  Consults called:  gastroenterology  Admission status: The appropriate patient status for this patient is INPATIENT. Inpatient status is judged to be reasonable and necessary in order to provide the required intensity of service to ensure the patient's safety. The patient's presenting symptoms, physical exam findings, and initial radiographic and laboratory data in the context of their chronic comorbidities is felt to place them at high risk for further clinical deterioration. Furthermore, it is not anticipated that the patient will be medically stable for discharge from the hospital within 2 midnights of admission.    I certify that at the point of admission it is my clinical judgment that the patient will require inpatient hospital care spanning beyond 2 midnights from the point of admission due to high intensity of service, high risk for further deterioration and high frequency of surveillance required  Time spent: 48 minutes  Elohim Brune Sharlette Dense MD [facility] Pager [phone]  If 7PM-7AM, please contact night-coverage [email or web address] Password [facility]  05/03/2023, 2:48 PM

## 2023-05-03 NOTE — Consult Note (Cosign Needed Addendum)
 Referring Provider: Dr.  Teena Dunk Primary Care Physician:  Sandre Kitty, MD Primary Gastroenterologist:  Dr. Tiajuana Amass  Reason for Consultation: Abdominal pain, chronic pancreatitis  HPI: Evan Sullivan is a 53 y.o. male with a past medical history of arthritis, hypertension, diabetes mellitus type 2, gout, GERD, H. 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  He was last seen in our outpatient GI clinic by Dr. Tomasa Rand  03/26/2023 for follow up regarding chronic pancreatitis. At that time, Dr. Tomasa Rand reviewed an abdominal/pelvic CT 02/2023 which showed mild inflammatory changes around the head of pancreas and uncinate process, with multiple cystic parenchymal lesions measuring up to 2 cm, as well as coarse parenchymal calcifications and diffuse main duct dilation up to 12 mm. A lipase level was 105 and a CA 19-9 was elevated at 77.  His appetite increased and he gained weight on Creon. It was recommended he undergo EUS and possible ERCP as our ability to provide these procedures was very limited. He was referred to [facility] to have these procedures done.  He underwent an EUS 04/12/2023 by Dr. Edyth Gunnels at [facility] which showed sonographic changes consistent with severe chronic pancreatitis, 1 abnormal lymph node was visualized in the peripancreatic reason and FNA  was negative for malignancy and there was no evidence of significant pathology in the left lobe of the liver.  A repeat abdominal MRI/MRCP in 6 to 8 weeks was recommended with follow-up with Dr.Canakis at [facility] to review the results.  He presented to the ED today due to constant LUQ pain pain for the past week.  Labs in the ED showed a WBC count of 5.1.  Hemoglobin 15.1.  Hematocrit 44.7.  Platelets 337.  Sodium 134.  Potassium 3.3.  Glucose 157.  BUN 10.  Creatinine 0.78.  Total bili 5.6.  Alk phos 265.  AST 157.  ALT 243.  Lipase 435.  Troponin 16 - 20. Urinalysis showed amber-colored urine.  HIV pending.  CTAP with contrast  showed acute on chronic pancreatitis with similar small pseudocyst in the U-splint process and head of the pancreas.  An abdominal MRI/MRCP ordered, not yet completed.  He denies having any noticeable upper abdominal pain post EUS. He developed abrupt onset burning LUQ pain 1 week ago which is constant and interferes with his sleep.  No significant nausea but he noted vomiting water earlier today after taking his morning medications.  No hematemesis.  He has a history of H. pylori gastritis identified per EGD 07/2021 treated with pantoprazole/Pepto/metronidazole/doxycycline x 14 days.  Posttreatment H. pylori stool antigen test was - 09/2021.  He was constipated over the past weekend and took MiraLAX for the past 2 to 3 days and passed a normal brown formed stool earlier today.  No bloody or black stools.  His urine has been darker color for the past few days and today while in the ED he passed dark tea colored urine.  No NSAID use.  Last alcohol intake was 7 to 8 months ago.  He smokes marijuana daily.  PRIOR IMAGE STUDIES:  CT abdomen/pelvis with contrast February 26, 2023   FINDINGS: Lower chest: No acute abnormality.   Hepatobiliary: No focal liver abnormality is seen. No gallstones, gallbladder wall thickening, or biliary dilatation.   Pancreas: Mild inflammatory changes adjacent to the head and uncinate process with multiple cystic appearing parenchymal lesions measuring up to 2 cm in the head (2:30). Coarse parenchymal calcifications with diffuse main duct dilation up to 12 mm.  Spleen: Normal in size without focal abnormality.   Adrenals/Urinary Tract: Adrenal glands are unremarkable. Kidneys are normal, without renal calculi, focal lesion, or hydronephrosis. Bladder is unremarkable.   Stomach/Bowel: Stomach is within normal limits. Appendix appears normal. Reactive inflammatory changes involve the distal duodenum. No bowel obstruction. Scattered colonic diverticulosis.    Vascular/Lymphatic: Aortic atherosclerosis. No enlarged abdominal or pelvic lymph nodes. 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 Patent SMA and porto-splenic vasculature.   Reproductive: Prostate is unremarkable.   Other: No abdominal wall hernia or abnormality. No abdominopelvic ascites.   Musculoskeletal: No acute or significant osseous findings. Multilevel degenerative changes. Right hip total arthroplasty.   IMPRESSION: Acute on chronic pancreatitis with chronic sequela including multiple new pancreatic pseudocysts and persistent main duct dilation    CT abdomen/pelvis August 2024 IMPRESSION: 1. Acute on chronic pancreatitis. 2. Hepatic steatosis  GI PROCEDURES:  EUS 04/12/2023 at [facility]: - There was no evidence of significant pathology in the left lobe of the liver.  - Endosonographic imaging of the pancreas showed sonographic changes consistent with severe chronic   pancreatitis.  - One abnormal lymph node was visualized in the  peripancreatic region. Fine needle biopsy performed.  - There was no sign of significant pathology in the common bile duct.  - Normal major papilla. Path report: - Peripancreatic lymph node, fine-needle biopsy - Blood and proteinaceous material with scattered macrophages, suggestive of cyst contents - Superficial sampling of benign small bowel mucosa with foveolar metaplasia - No dysplasia or malignancy is identified in this sampling  EGD 07/16/2021: - Normal esophagus. - Gastroesophageal flap valve classified as Hill Grade IV (no fold, wide open lumen, hiatal hernia present). - Gastritis with hemorrhage.  Localized severe inflammation with hemorrhage characterized by adherent blood, erythema and shallow ulcerations was found in the gastric antrum. Biopsies were taken with a cold forceps for Helicobacter pylori testing. Estimated blood loss was minimal.Biopsied. This is the likely source of the patient's iron  deficiency anemia. - Duodenitis. - Localized severe inflammation characterized by congestion (edema), erythema and shallow ulcerations was found in the duodenal bulb and in the second portion of the duodenum. Biopsies were taken with a cold forceps for histology. Estimated blood loss was minimal.   Colonoscopy 07/16/2021: - One 6 mm polyp in the ascending colon, removed with a cold snare. Resected and retrieved. - Two 3 to 5 mm polyps in the descending colon, removed retrieved. - Diverticulosis in the sigmoid colon, in the transverse colon and in the ascending colon. - Non-bleeding internal hemorrhoids. - 7 year recall colonoscopy   1. Surgical [P], duodenal biopsies  ACUTE DUODENITIS 2. 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 Surgical [P], colon, ascending and descending, polyp (3) TUBULAR ADENOMA HYPERPLASTIC POLYP NEGATIVE FOR HIGH-GRADE DYSPLASIA AND CARCINOMA   Colonoscopy 11/30/2019: - One 15 mm polyp in the ascending colon, removed with a hot snare. Resected and retrieved. Clip (MR conditional) was placed. - Two 3 to 5 mm polyps in the rectum, removed with a cold snare. Resected and retrieved. - The examination was otherwise normal. - The distal rectum and anal verge are normal on retroflexion view. A. COLON POLYP, ASCENDING; HOT SNARE:  - COLONIC MUCOSA WITH SUBMUCOSAL LIPOMA.  - NEGATIVE FOR DYSPLASIA AND MALIGNANCY.  B.  RECTUM POLYP; COLD SNARE:  - HYPERPLASTIC POLYP.  -  NEGATIVE FOR DYSPLASIA AND MALIGNANCY.   Past Medical History:  Diagnosis Date   Anemia    Arthritis    Avascular necrosis of femoral head, right (HCC) 2019   Diet-controlled type 2 diabetes mellitus (HCC) 09/07/2021   GERD (gastroesophageal reflux  disease)    Gout    Hypertension    Peptic ulcer disease     Past Surgical History:  Procedure Laterality Date   COLONOSCOPY     COLONOSCOPY WITH PROPOFOL N/A 11/30/2019   Procedure: COLONOSCOPY WITH PROPOFOL;  Surgeon: Toney Reil, MD;  Location: [facility];  Service: Endoscopy;  Laterality: N/A;  priority 4   KNEE ARTHROSCOPY, MEDIAL PATELLO FEMORAL LIGAMENT REPAIR Bilateral 2004,2000   POLYPECTOMY  11/30/2019   Procedure: POLYPECTOMY;  Surgeon: Toney Reil, MD;  Location: [facility];  Service: Endoscopy;;   TOTAL HIP ARTHROPLASTY Right 08/02/2017   Procedure: TOTAL HIP ARTHROPLASTY;  Surgeon: Donato Heinz, MD;  Location: [facility];  Service: Orthopedics;  Laterality: Right;    Prior to Admission medications   Medication Sig Start Date End Date Taking? Authorizing Provider  amLODipine (NORVASC) 10 MG tablet TAKE 1 TABLET BY MOUTH EVERY DAY 04/19/23   Sandre Kitty, MD  atenolol (TENORMIN) 100 MG tablet TAKE 1 TABLET BY MOUTH EVERY DAY 04/19/23   Sandre Kitty, MD  colchicine 0.6 MG tablet Take 1 capsule by mouth daily as needed (gout pain) 06/26/20   McDonough, Lauren K, PA-C  CREON 36000-114000 units CPEP capsule TAKE 2 CAPSULES (72,000 UNITS TOTAL) BY MOUTH WITH BREAKFAST, WITH LUNCH, AND WITH EVENING MEAL. 04/14/23   Jenel Lucks, MD  fenofibrate (TRICOR) 48 MG tablet TAKE 1 TABLET BY MOUTH EVERY DAY 03/25/23   Sandre Kitty, MD  gabapentin (NEURONTIN) 300 MG capsule Take 1 capsule (300 mg total) by mouth at bedtime. 02/09/23   Jenel Lucks, MD  hydrocortisone cream 1 % Apply 1 application  topically daily as needed (rash).    [provider]  pantoprazole (PROTONIX) 40 MG tablet TAKE 1 TABLET (40 MG TOTAL) BY MOUTH DAILY TAKE 30 MINUTES BEFORE BREAKFAST 04/15/23   Arnaldo Natal, NP    No current facility-administered medications for this encounter.   Current Outpatient Medications  Medication Sig Dispense Refill    amLODipine (NORVASC) 10 MG tablet TAKE 1 TABLET BY MOUTH EVERY DAY 90 tablet 1   atenolol (TENORMIN) 100 MG tablet TAKE 1 TABLET BY MOUTH EVERY DAY 90 tablet 1   colchicine 0.6 MG tablet Take 1 capsule by mouth daily as needed (gout pain) 30 tablet 1   CREON 36000-114000 units CPEP capsule TAKE 2 CAPSULES (72,000 UNITS TOTAL) BY MOUTH WITH BREAKFAST, WITH LUNCH, AND WITH EVENING MEAL. 200 capsule 1   fenofibrate (TRICOR) 48 MG tablet TAKE 1 TABLET BY MOUTH EVERY DAY 90 tablet 0   gabapentin (NEURONTIN) 300 MG capsule Take 1 capsule (300 mg total) by mouth at bedtime. 30 capsule 1   hydrocortisone cream 1 % Apply 1 application  topically daily as needed (rash).     pantoprazole (PROTONIX) 40 MG tablet TAKE 1 TABLET (40 MG TOTAL) BY MOUTH DAILY TAKE 30 MINUTES BEFORE BREAKFAST 90 tablet 1    Allergies as of 05/03/2023   (No Known Allergies)    Family History  Problem Relation Age of Onset   Hypertension Mother    Hypertension Father    Colon cancer Father 46   Diabetes Father    Pancreatic cancer Neg Hx  Esophageal cancer Neg Hx    Liver cancer Neg Hx    Stomach cancer Neg Hx     Social History   Socioeconomic History   Marital status: Single    Spouse name: Not on file   Number of children: Not on file   Years of education: Not on file   Highest education level: Not on file  Occupational History   Not on file  Tobacco Use   Smoking status: Never   Smokeless tobacco: Never  Vaping Use   Vaping status: Never Used  Substance and Sexual Activity   Alcohol use: Not Currently   Drug use: Yes    Types: Marijuana    Comment: daily    Sexual activity: Yes    Birth control/protection: Condom  Other Topics Concern   Not on file  Social History Narrative   Not on file   Social Drivers of Health   Financial Resource Strain: Unknown (04/23/2017)   Received from [facility], [facility]   Overall Financial Resource Strain (CARDIA)     Difficulty of Paying Living Expenses: Patient declined  Food Insecurity: Unknown (04/23/2017)   Received from [facility], [facility]   Hunger Vital Sign    Worried About Running Out of Food in the Last Year: Patient declined    Ran Out of Food in the Last Year: Patient declined  Transportation Needs: Unknown (04/23/2017)   Received from [facility], [facility]   [facility] - Transportation    Lack of Transportation (Medical): Patient declined    Lack of Transportation (Non-Medical): Patient declined  Physical Activity: Unknown (04/23/2017)   Received from [facility], [facility]   Exercise Vital Sign    Days of Exercise per Week: Patient declined    Minutes of Exercise per Session: Patient declined  Stress: Unknown (04/23/2017)   Received from [facility], [facility]   [employer] of Occupational Health - Occupational Stress Questionnaire    Feeling of Stress : Patient declined  Social Connections: Unknown (04/23/2017)   Received from [facility], [facility]   Social Connection and Isolation Panel [NHANES]    Frequency of Communication with Friends and Family: Patient declined    Frequency of Social Gatherings with Friends and Family: Patient declined    Attends Religious Services: Patient declined    Active Member of Clubs or Organizations: Patient declined    Attends Banker Meetings: Patient declined    Marital Status: Patient declined  Catering manager Violence: Not on file   Review of Systems: Gen: Prior weight loss, gained back 9lbs, recently weight stable.  CV: Denies chest pain, palpitations or edema. Resp: Denies cough, shortness of breath of hemoptysis.  GI:See HPI. Denies GERD symptoms.    GU : Denies urinary burning, blood in urine, increased urinary frequency or  incontinence. MS: Denies joint pain, muscles aches or weakness. Derm: Denies rash, itchiness, skin lesions or unhealing ulcers. Psych: Denies depression, anxiety, memory loss or confusion. Heme: Denies easy bruising, bleeding. Neuro:  Denies headaches, dizziness or paresthesias. Endo:  + DM type II.  Physical Exam: Vital signs in last 24 hours: Temp:  [98.1 F (36.7 C)-98.2 F (36.8 C)] 98.1 F (36.7 C) (03/31 1306) Pulse Rate:  [63-72] 70 (03/31 1315) Resp:  [18-19] 18 (03/31 1315) BP: (165-182)/(88-110) 171/97 (03/31 1315) SpO2:  [100 %] 100 % (  03/31 1315) Weight:  [83 kg] 83 kg (03/31 0848)   General: Alert 53 year old male in no acute distress. Head:  Normocephalic and atraumatic. Eyes: Mild scleral icterus.  Conjunctiva pink. Ears:  Normal auditory acuity. Nose:  No deformity, discharge or lesions. Mouth: Poor dentition.  No ulcers or lesions.  Neck:  Supple. No lymphadenopathy or thyromegaly.  Lungs: Breath sounds clear throughout. No wheezes, rhonchi or crackles.  Heart: Regular rate and rhythm, no murmurs. Abdomen: Soft, nondistended.  Nontender at this time.  Positive bowel sounds all 4 quadrants.  No bruit.  No palpable mass. Rectal: Deferred. Musculoskeletal:  Symmetrical without gross deformities.  Pulses:  Normal pulses noted. Extremities:  Without clubbing or edema. Neurologic:  Alert and  oriented x 4. No focal deficits.  Skin:  Intact without significant lesions or rashes. Psych:  Alert and cooperative. Normal mood and affect.  Intake/Output from previous day: No intake/output data recorded. Intake/Output this shift: No intake/output data recorded.  Lab Results: Recent Labs    05/03/23 0850  WBC 5.1  HGB 15.1  HCT 44.7  PLT 337   BMET Recent Labs    05/03/23 0850  NA 134*  K 3.3*  CL 98  CO2 26  GLUCOSE 157*  BUN 10  CREATININE 0.78  CALCIUM 9.4   LFT Recent Labs    05/03/23 0850  PROT 7.2  ALBUMIN 3.5  AST 157*  ALT 243*   ALKPHOS 265*  BILITOT 5.6*   PT/INR No results for input(s): "LABPROT", "INR" in the last 72 hours. Hepatitis Panel No results for input(s): "HEPBSAG", "HCVAB", "HEPAIGM", "HEPBIGM" in the last 72 hours.  Studies/Results: CT ABDOMEN PELVIS W CONTRAST Result Date: 05/03/2023 CLINICAL DATA:  Abdominal pain. EXAM: CT ABDOMEN AND PELVIS WITH CONTRAST TECHNIQUE: Multidetector CT imaging of the abdomen and pelvis was performed using the standard protocol following bolus administration of intravenous contrast. RADIATION DOSE REDUCTION: This exam was performed according to the departmental dose-optimization program which includes automated exposure control, adjustment of the mA and/or kV according to patient size and/or use of iterative reconstruction technique. CONTRAST:  OMNIPAQUE IOHEXOL 300 MG/ML  SOLN COMPARISON:  CT abdomen pelvis dated 02/18/2023. FINDINGS: Lower chest: The visualized lung bases are clear. No intra-abdominal free air.  Small free fluid in the pelvis. Hepatobiliary: The liver is unremarkable. Mild biliary dilatation. The gallbladder is distended. No calcified gallstone. Pancreas: There is inflammatory changes of the pancreas. Enlargement of the proximal pancreas predominantly involving the head and uncinate process with dilatation of the main pancreatic duct. Coarse calcifications of the proximal pancreas sequela of chronic pancreatitis. Similar appearance of small cystic structures in the head and uncinate process of the pancreas, likely pseudocysts. Spleen: Normal in size without focal abnormality. Adrenals/Urinary Tract: The adrenal glands are unremarkable. The kidneys, visualized ureters, and urinary bladder appear unremarkable. Stomach/Bowel: There is no bowel obstruction or active inflammation. The appendix is normal. Vascular/Lymphatic: Mild aortoiliac atherosclerotic disease. The IVC is unremarkable. No portal venous gas. There is compression of the main portal vein by the  inflammatory changes of the pancreas. No adenopathy. Reproductive: The prostate and seminal vesicles are grossly remarkable. Other: Intramuscular lipoma of the left groin. Musculoskeletal: Total right hip arthroplasty. No acute osseous pathology. IMPRESSION: 1. Acute on chronic pancreatitis. Similar small pseudocyst in the uncinate process and head of the pancreas. 2. No bowel obstruction. Normal appendix. Electronically Signed   By: Elgie Collard M.D.   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 S/P EUS at [facility] 04/12/2023 showed sonographic changes consistent with severe chronic pancreatitis, one abnormal lymph node was visualized in the peripancreatic region. FNA  was negative for malignancy. A repeat abdominal MRI/MRCP in 6 to 8 weeks. He developed constant LUQ burning pain one week ago which has progressively worsened including one episode of vomiting up water earlier today. Elevated LFTs. Lipase level 325. CTAP with contrast showed acute on chronic pancreatitis with similar small pseudocyst in the uncinate process and head of the pancreas.  -Await abdominal MRI/MRCP results  -Clear liquid diet  -IV fluids and pain management per the hospitalist -Hepatic panel in am -Creon 36K two capsule po tid with meals  -Await further recommendations per Dr. Marina Goodell   History of colon polyps. Colonoscopy 07/2021 identified 1 tubular adenomatous and 1 hyperplastic polyp removed from the colon. -Next colon polyp surveillance colonoscopy due 07/2028  History of GERD. H. pylori gastritis per EGD 07/2021, treated with successful eradication. -Pantoprazole 40mg  every day   Arnaldo Natal  05/03/2023, 4:47 PM  GI ATTENDING  History, laboratories, x-rays, endoscopy reports, [facility] encounter all personally reviewed.  Patient seen and examined as outlined above.  Agree with comprehensive consultation note as outlined above.  Patient  presents with acute on chronic pancreatitis associated with pain.  Elevated liver test most likely secondary to pancreatitis effect on the bile duct.  Treatment is supportive with IV fluids, bowel rest, and analgesia as needed.  Await MRCP.  He does have follow-up plans with [facility].  Wilhemina Bonito. Eda Keys., M.D. [facility] Division of Gastroenterology

## 2023-05-03 NOTE — ED Provider Notes (Signed)
 [place] EMERGENCY DEPARTMENT AT [facility] Provider Note   CSN: [id number] Arrival date & time: 05/03/23  8413     History  Chief Complaint  Patient presents with   Abdominal Pain    Evan Sullivan is a 53 y.o. male.  HPI Patient presents for abdominal pain.  Medical history includes DM, GERD, HLD, polysubstance abuse, HTN, chronic pancreatitis, polysubstance use.  He has been abstaining from alcohol for the past several months.  2 weeks ago, he underwent transendoscopic ultrasound with biopsy of mass.  Although he does have an element of mild chronic pain, patient had worsened abdominal pain for the past 5 days.  He describes it as left upper quadrant in location.  He takes Tylenol only for pain relief, typically at night.  He will have intermittent nausea.  This morning, he took his morning medications.  When he drank water afterwards, he had an episode of emesis.  Pain worsened this morning to 8/10 in severity.  Since then, it has subsided slightly.    Home Medications Prior to Admission medications   Medication Sig Start Date End Date Taking? Authorizing Provider  amLODipine (NORVASC) 10 MG tablet TAKE 1 TABLET BY MOUTH EVERY DAY 04/19/23   Sandre Kitty, MD  atenolol (TENORMIN) 100 MG tablet TAKE 1 TABLET BY MOUTH EVERY DAY 04/19/23   Sandre Kitty, MD  colchicine 0.6 MG tablet Take 1 capsule by mouth daily as needed (gout pain) 06/26/20   McDonough, Lauren K, PA-C  CREON 36000-114000 units CPEP capsule TAKE 2 CAPSULES (72,000 UNITS TOTAL) BY MOUTH WITH BREAKFAST, WITH LUNCH, AND WITH EVENING MEAL. 04/14/23   Jenel Lucks, MD  fenofibrate (TRICOR) 48 MG tablet TAKE 1 TABLET BY MOUTH EVERY DAY 03/25/23   Sandre Kitty, MD  gabapentin (NEURONTIN) 300 MG capsule Take 1 capsule (300 mg total) by mouth at bedtime. 02/09/23   Jenel Lucks, MD  hydrocortisone cream 1 % Apply 1 application  topically daily as needed (rash).    [provider]   pantoprazole (PROTONIX) 40 MG tablet TAKE 1 TABLET (40 MG TOTAL) BY MOUTH DAILY TAKE 30 MINUTES BEFORE BREAKFAST 04/15/23   Arnaldo Natal, NP      Allergies    Patient has no known allergies.    Review of Systems   Review of Systems  Gastrointestinal:  Positive for abdominal pain, nausea and vomiting.  All other systems reviewed and are negative.   Physical Exam Updated Vital Signs BP (!) 171/97   Pulse 70   Temp 98.1 F (36.7 C)   Resp 18   Ht 6\' 1"  (1.854 m)   Wt 83 kg   SpO2 100%   BMI 24.14 kg/m  Physical Exam Vitals and nursing note reviewed.  Constitutional:      General: He is not in acute distress.    Appearance: He is well-developed. He is not ill-appearing, toxic-appearing or diaphoretic.  HENT:     Head: Normocephalic and atraumatic.     Mouth/Throat:     Mouth: Mucous membranes are moist.  Eyes:     General: Scleral icterus present.     Extraocular Movements: Extraocular movements intact.     Conjunctiva/sclera: Conjunctivae normal.  Cardiovascular:     Rate and Rhythm: Normal rate and regular rhythm.  Pulmonary:     Effort: Pulmonary effort is normal. No respiratory distress.  Abdominal:     Palpations: Abdomen is soft.     Tenderness: There is abdominal  tenderness in the epigastric area. There is no guarding or rebound.  Musculoskeletal:        General: No swelling.     Cervical back: Neck supple.  Skin:    General: Skin is warm and dry.  Neurological:     General: No focal deficit present.     Mental Status: He is alert and oriented to person, place, and time.  Psychiatric:        Mood and Affect: Mood normal.        Behavior: Behavior normal.     ED Results / Procedures / Treatments   Labs (all labs ordered are listed, but only abnormal results are displayed) Labs Reviewed  LIPASE, BLOOD - Abnormal; Notable for the following components:      Result Value   Lipase 435 (*)    All other components within normal limits   COMPREHENSIVE METABOLIC PANEL WITH GFR - Abnormal; Notable for the following components:   Sodium 134 (*)    Potassium 3.3 (*)    Glucose, Bld 157 (*)    AST 157 (*)    ALT 243 (*)    Alkaline Phosphatase 265 (*)    Total Bilirubin 5.6 (*)    All other components within normal limits  URINALYSIS, ROUTINE W REFLEX MICROSCOPIC - Abnormal; Notable for the following components:   Color, Urine AMBER (*)    APPearance HAZY (*)    Glucose, UA   (*)    Value: TEST NOT REPORTED DUE TO COLOR INTERFERENCE OF URINE PIGMENT   Hgb urine dipstick   (*)    Value: TEST NOT REPORTED DUE TO COLOR INTERFERENCE OF URINE PIGMENT   Bilirubin Urine   (*)    Value: TEST NOT REPORTED DUE TO COLOR INTERFERENCE OF URINE PIGMENT   Ketones, ur   (*)    Value: TEST NOT REPORTED DUE TO COLOR INTERFERENCE OF URINE PIGMENT   Protein, ur   (*)    Value: TEST NOT REPORTED DUE TO COLOR INTERFERENCE OF URINE PIGMENT   Nitrite   (*)    Value: TEST NOT REPORTED DUE TO COLOR INTERFERENCE OF URINE PIGMENT   Leukocytes,Ua   (*)    Value: TEST NOT REPORTED DUE TO COLOR INTERFERENCE OF URINE PIGMENT   All other components within normal limits  TROPONIN I (HIGH SENSITIVITY) - Abnormal; Notable for the following components:   Troponin I (High Sensitivity) 20 (*)    All other components within normal limits  CBC  MAGNESIUM  TROPONIN I (HIGH SENSITIVITY)    EKG EKG Interpretation Date/Time:  Monday May 03 2023 10:25:01 EDT Ventricular Rate:  63 PR Interval:  152 QRS Duration:  115 QT Interval:  397 QTC Calculation: 407 R Axis:   -55  Text Interpretation: Sinus rhythm LAD, consider left anterior fascicular block Abnrm T, consider ischemia, anterolateral lds Confirmed by Gloris Manchester ([id number]) on 05/03/2023 10:29:27 AM  Radiology CT ABDOMEN PELVIS W CONTRAST Result Date: 05/03/2023 CLINICAL DATA:  Abdominal pain. EXAM: CT ABDOMEN AND PELVIS WITH CONTRAST TECHNIQUE: Multidetector CT imaging of the abdomen and pelvis was  performed using the standard protocol following bolus administration of intravenous contrast. RADIATION DOSE REDUCTION: This exam was performed according to the departmental dose-optimization program which includes automated exposure control, adjustment of the mA and/or kV according to patient size and/or use of iterative reconstruction technique. CONTRAST:  OMNIPAQUE IOHEXOL 300 MG/ML  SOLN COMPARISON:  CT abdomen pelvis dated 02/18/2023. FINDINGS: Lower chest: The visualized lung bases  are clear. No intra-abdominal free air.  Small free fluid in the pelvis. Hepatobiliary: The liver is unremarkable. Mild biliary dilatation. The gallbladder is distended. No calcified gallstone. Pancreas: There is inflammatory changes of the pancreas. Enlargement of the proximal pancreas predominantly involving the head and uncinate process with dilatation of the main pancreatic duct. Coarse calcifications of the proximal pancreas sequela of chronic pancreatitis. Similar appearance of small cystic structures in the head and uncinate process of the pancreas, likely pseudocysts. Spleen: Normal in size without focal abnormality. Adrenals/Urinary Tract: The adrenal glands are unremarkable. The kidneys, visualized ureters, and urinary bladder appear unremarkable. Stomach/Bowel: There is no bowel obstruction or active inflammation. The appendix is normal. Vascular/Lymphatic: Mild aortoiliac atherosclerotic disease. The IVC is unremarkable. No portal venous gas. There is compression of the main portal vein by the inflammatory changes of the pancreas. No adenopathy. Reproductive: The prostate and seminal vesicles are grossly remarkable. Other: Intramuscular lipoma of the left groin. Musculoskeletal: Total right hip arthroplasty. No acute osseous pathology. IMPRESSION: 1. Acute on chronic pancreatitis. Similar small pseudocyst in the uncinate process and head of the pancreas. 2. No bowel obstruction. Normal appendix. Electronically  Signed   By: Elgie Collard M.D.   On: 05/03/2023 13:30    Procedures Procedures    Medications Ordered in ED Medications  lactated ringers infusion (has no administration in time range)  fentaNYL (SUBLIMAZE) injection 50 mcg (50 mcg Intravenous Given 05/03/23 0915)  lactated ringers bolus 1,000 mL (0 mLs Intravenous Stopped 05/03/23 1132)  fentaNYL (SUBLIMAZE) injection 50 mcg (50 mcg Intravenous Given 05/03/23 1129)  iohexol (OMNIPAQUE) 300 MG/ML solution 100 mL (100 mLs Intravenous Contrast Given 05/03/23 1223)    ED Course/ Medical Decision Making/ A&P                                 Medical Decision Making Amount and/or Complexity of Data Reviewed Labs: ordered. Radiology: ordered.  Risk Prescription drug management. Decision regarding hospitalization.   This patient presents to the ED for concern of abdominal pain, this involves an extensive number of treatment options, and is a complaint that carries with it a high risk of complications and morbidity.  The differential diagnosis includes pancreatitis, gastritis, PUD, colitis, postprocedural complication, worsening of chronic pain   Co morbidities that complicate the patient evaluation  DM, GERD, HLD, polysubstance abuse, HTN, chronic pancreatitis, polysubstance use   Additional history obtained:  Additional history obtained from N/A External records from outside source obtained and reviewed including EMR   Lab Tests:  I Ordered, and personally interpreted labs.  The pertinent results include: Normal hemoglobin, no leukocytosis   Imaging Studies ordered:  I ordered imaging studies including CT of abdomen pelvis I independently visualized and interpreted imaging which showed acute on chronic pancreatitis I agree with the radiologist interpretation   Cardiac Monitoring: / EKG:  The patient was maintained on a cardiac monitor.  I personally viewed and interpreted the cardiac monitored which showed an  underlying rhythm of: Sinus rhythm   Consultations Obtained:  I requested consultation with the [facility] gastroenterology team, PA Alcide Evener,  and discussed lab and imaging findings as well as pertinent plan - they recommend: Will see in consult   Problem List / ED Course / Critical interventions / Medication management  Patient presenting for left upper quadrant abdominal pain, worsening over the past 5 days.  He also had onset of dark urine today.  On arrival in the ED, vital signs notable for hypertension.  He is overall well-appearing on exam.  Scleral icterus is noted.  His significant other states that his eyes are normal appearance.  His abdomen is soft.  He has mild upper abdominal tenderness.  He does endorse 1 episode of emesis this morning, as well as poor p.o. intake over the past over days due to intermittent nausea.  Workup was initiated.  IV fluids ordered for hydration.  Fentanyl ordered for analgesia.  Patient required additional dose of fentanyl.  On reassessment, pain is controlled.  Lab work is notable for elevated lipase and cholestatic pattern of hepatobiliary enzymes.  CT scan showed acute on chronic pancreatitis with redemonstration of small pseudocyst in uncinate process and head of pancreas.  Given concern for new onset cholestasis, MRCP was ordered.  GI was consulted.  GI team will see in consult.  Patient was admitted for further management. I ordered medication including IV fluids for hydration; fentanyl for analgesia Reevaluation of the patient after these medicines showed that the patient improved I have reviewed the patients home medicines and have made adjustments as needed   Social Determinants of Health:  Has access to outpatient care        Final Clinical Impression(s) / ED Diagnoses Final diagnoses:  Acute pancreatitis without infection or necrosis, unspecified pancreatitis type  Cholestasis    Rx / DC Orders ED Discharge Orders      None         Gloris Manchester, MD 05/03/23 1432

## 2023-05-04 DIAGNOSIS — K859 Acute pancreatitis without necrosis or infection, unspecified: Secondary | ICD-10-CM | POA: Diagnosis not present

## 2023-05-04 DIAGNOSIS — D649 Anemia, unspecified: Secondary | ICD-10-CM

## 2023-05-04 DIAGNOSIS — K9189 Other postprocedural complications and disorders of digestive system: Secondary | ICD-10-CM | POA: Diagnosis not present

## 2023-05-04 DIAGNOSIS — K831 Obstruction of bile duct: Secondary | ICD-10-CM

## 2023-05-04 DIAGNOSIS — R935 Abnormal findings on diagnostic imaging of other abdominal regions, including retroperitoneum: Secondary | ICD-10-CM

## 2023-05-04 DIAGNOSIS — K860 Alcohol-induced chronic pancreatitis: Secondary | ICD-10-CM | POA: Diagnosis not present

## 2023-05-04 DIAGNOSIS — K8681 Exocrine pancreatic insufficiency: Secondary | ICD-10-CM | POA: Diagnosis not present

## 2023-05-04 DIAGNOSIS — R1012 Left upper quadrant pain: Secondary | ICD-10-CM | POA: Diagnosis not present

## 2023-05-04 DIAGNOSIS — F1090 Alcohol use, unspecified, uncomplicated: Secondary | ICD-10-CM | POA: Diagnosis not present

## 2023-05-04 DIAGNOSIS — K838 Other specified diseases of biliary tract: Secondary | ICD-10-CM

## 2023-05-04 LAB — CBC
HCT: 38.2 % — ABNORMAL LOW (ref 39.0–52.0)
Hemoglobin: 12.8 g/dL — ABNORMAL LOW (ref 13.0–17.0)
MCH: 30.8 pg (ref 26.0–34.0)
MCHC: 33.5 g/dL (ref 30.0–36.0)
MCV: 91.8 fL (ref 80.0–100.0)
Platelets: 284 10*3/uL (ref 150–400)
RBC: 4.16 MIL/uL — ABNORMAL LOW (ref 4.22–5.81)
RDW: 12.5 % (ref 11.5–15.5)
WBC: 3.9 10*3/uL — ABNORMAL LOW (ref 4.0–10.5)
nRBC: 0 % (ref 0.0–0.2)

## 2023-05-04 LAB — COMPREHENSIVE METABOLIC PANEL WITH GFR
ALT: 157 U/L — ABNORMAL HIGH (ref 0–44)
AST: 94 U/L — ABNORMAL HIGH (ref 15–41)
Albumin: 2.9 g/dL — ABNORMAL LOW (ref 3.5–5.0)
Alkaline Phosphatase: 206 U/L — ABNORMAL HIGH (ref 38–126)
Anion gap: 12 (ref 5–15)
BUN: 9 mg/dL (ref 6–20)
CO2: 21 mmol/L — ABNORMAL LOW (ref 22–32)
Calcium: 8.7 mg/dL — ABNORMAL LOW (ref 8.9–10.3)
Chloride: 102 mmol/L (ref 98–111)
Creatinine, Ser: 0.52 mg/dL — ABNORMAL LOW (ref 0.61–1.24)
GFR, Estimated: >60 mL/min (ref >60)
Glucose, Bld: 83 mg/dL (ref 70–99)
Potassium: 3.1 mmol/L — ABNORMAL LOW (ref 3.5–5.1)
Sodium: 135 mmol/L (ref 135–145)
Total Bilirubin: 8.1 mg/dL — ABNORMAL HIGH (ref 0.0–1.2)
Total Protein: 5.9 g/dL — ABNORMAL LOW (ref 6.5–8.1)

## 2023-05-04 LAB — BILIRUBIN, DIRECT: Bilirubin, Direct: 5.2 mg/dL — ABNORMAL HIGH (ref 0.0–0.2)

## 2023-05-04 LAB — LIPASE, BLOOD: Lipase: 78 U/L — ABNORMAL HIGH (ref 11–51)

## 2023-05-04 MED ORDER — B COMPLEX-C PO TABS
1.0000 | ORAL_TABLET | Freq: Every day | ORAL | Status: DC
  Administered 2023-05-04 – 2023-05-06 (×3): 1 via ORAL
  Filled 2023-05-04 (×3): qty 1

## 2023-05-04 MED ORDER — PANCRELIPASE (LIP-PROT-AMYL) 36000-114000 UNITS PO CPEP
36000.0000 [IU] | ORAL_CAPSULE | Freq: Three times a day (TID) | ORAL | Status: DC
  Administered 2023-05-04 – 2023-05-06 (×6): 36000 [IU] via ORAL
  Filled 2023-05-04 (×8): qty 1

## 2023-05-04 MED ORDER — BOOST / RESOURCE BREEZE PO LIQD CUSTOM
1.0000 | Freq: Three times a day (TID) | ORAL | Status: DC
  Administered 2023-05-04 – 2023-05-05 (×2): 1 via ORAL

## 2023-05-04 MED ORDER — SODIUM CHLORIDE 0.9 % IV SOLN: INTRAVENOUS | Status: AC

## 2023-05-04 MED ORDER — POTASSIUM CHLORIDE CRYS ER 20 MEQ PO TBCR
30.0000 meq | EXTENDED_RELEASE_TABLET | ORAL | Status: AC
  Administered 2023-05-04 (×2): 30 meq via ORAL
  Filled 2023-05-04 (×3): qty 1

## 2023-05-04 NOTE — Discharge Instructions (Signed)
 Discharge Instructions:  Please take all prescribed medications exactly as instructed including your Creon pancreatic supplements and pain medication. Please follow dietary guidelines as detailed below under "pancreatitis nutrition therapy."  Please increase your physical activity as tolerated. Please maintain all outpatient follow-up appointments including: 1.)  Follow-up with your primary care provider 2.)  follow-up with Dr. Eustace Quail with [facility] gastroenterology on 5/28 3.) [place] gastroenterology should be arranging follow-up surveillance labs for your liver in 1 week.  They will contact you. Please return to the emergency department if you develop worsening abdominal pain, fevers in excess of 100.4 F, weakness or inability to tolerate oral intake.   Pancreatitis Nutrition Therapy  The pancreas helps your body digest and absorb nutrients in food. Pancreatitis prevents the body from digesting food well, especially if the food is high in fat.  This nutrition therapy limits the fat in your diet while providing nutrients you need. Your goal is to eat as near to a normal diet as possible without experiencing gastrointestinal (GI) symptoms. These symptoms include stomach pain, bloating, weight loss or difficulty maintaining weight, vomiting, burping, loose stools, and steatorrhea. The effects of pancreatitis are different for all individuals, so it is important to work with your registered dietitian nutritionist (RDN) to determine which foods trigger your GI symptoms. Your RDN can also help you figure out your tolerance to fat in foods and how to manage your symptoms with your diet.  Tips You may need to take pancreatic enzymes if you have frequent, loose stools after mealtimes. Individuals who take pancreatic enzymes will need to take the prescribed dosage at the start of a meal or snack. Individuals who are prescribed a low-fat diet will need to limit fats and oils to no more than 6 teaspoons (30  grams) daily. Up to 1 ounce of avocado can also be substituted for 1 teaspoon of fat. A low-fat diet may not be needed if you are taking pancreatic enzymes. Avoid drinking alcohol. Keep a bottle of water with you at all times to stay hydrated and ensure you are getting in enough fluid each day. The general recommendation is to drink 8 cups (64 ounces) of fluids daily. Eat small, frequent meals (4-6) throughout the day to help you recover from pancreatitis or to maintain your normal body weight. Eat more whole fruits and vegetables rather than drinking fruit and vegetable juices.  Fiber is found in whole grain foods and slows digestion. You may need to choose whole grain foods less often if you feel full quickly after eating. Ask your RDN for recommendations on managing your diet if you also have other conditions, such as diabetes mellitus. Your RDN might recommend a vitamin and mineral supplement or fat-soluble vitamin supplements if you require higher amounts of these nutrients. *B complex  Foods Recommended and Foods Not Recommended The following list of foods may be helpful if you need to limit your fat intake. Food Group Foods Recommended Foods Not Recommended  Grains Breads: Bagels, buns, English muffins Hot/cold cereals Couscous Low-fat crackers Pancakes Pasta Popcorn, air popped Rice Corn or flour tortilla Products made with added fat (such as biscuits, waffles, and regular crackers) High-fat bakery products such as doughnuts, biscuits, croissants, Danish pastries, pies, cookies Snacks made with partially hydrogenated oils including chips, cheese puffs, snack mixes, regular crackers, butter-flavored popcorn  Protein Foods Lean cuts of poultry (without skin) such as chicken or [place] Low-fat hamburger (for example, 7% fat) Lean cuts of fish (white fish) Canned tuna in water Egg  whites or egg substitute Lean deli meats such as [place], chicken, lean beef Non-animal protein sources  (tofu, legumes, beans, lentils) Smooth nut butters     Higher-fat cuts of meats such as ribs, T-bone steak, regular hamburger (15% to 20% fat) Full-fat processed meats (hot dogs, bologna, salami, sausage, bacon, etc) Red meats Organ meats (liver, brains, sweetbreads) Poultry with skin Fried meat, poultry, tofu, and fish Whole eggs and egg yolks Full-fat refried beans Tree nuts and peanuts  Dairy and Dairy Alternatives 1% or fat-free dairy (milk, yogurt, cheese, cottage cheese, sour cream) Frozen yogurt Fortified non-dairy milk (almond, rice, soy, etc.) Creamy/cheesy sauces Cream Whole-fat or reduced-fat (2%) dairy (milk, yogurt, ice cream, cheese) Milkshakes Half-and-half Cream cheese Sour cream Coconut milk  Vegetables All fresh, frozen, or canned vegetables Fried or stir-fried vegetables Vegetables prepared with butter, cheese, or cream sauce  Fruit All fresh, frozen, or canned fruit Fried fruits Fruit served with butter or cream  Fats and Oils All vegetable oils   Butter, stick margarine, shortening, partially hydrogenated oils, tropical oils (coconut, palm, and palm kernel oils)   Pancreatitis Sample 1-Day Menu View Nutrient Info Breakfast 2 egg whites, cooked 1 whole wheat bagel 1 tablespoon low-fat cream cheese 1 cup fat-free milk  cup blueberries  Lunch 2 slices bread 2 ounces [place] breast 2 leaves lettuce 2 slices tomato 1 teaspoon mustard 2 teaspoons nonfat mayonnaise 1 cup carrots  cup pineapple 1 cup fat-free milk  Evening Meal 3 ounces tilapia, baked  cup cooked rice  cup zucchini 1 cup lettuce for salad 2 teaspoons fat-free [place] dressing, for salad 1 dinner roll 1 teaspoon margarine  Evening Snack  cup low-fat yogurt 1 cup strawberries 1 ounce pretzels  Daily Sum Nutrient Unit Value  Macronutrients  Energy kcal 1560  Energy kJ 6541  Protein g 103  Total lipid (fat) g 23  Carbohydrate, by difference g 241  Fiber, total dietary g 23   Sugars, total g 104  Minerals  Calcium, Ca mg 1159  Iron, Fe mg 12  Sodium, Na mg 2034  Vitamins  Vitamin C, total ascorbic acid mg 158  Vitamin A, IU IU 28445  Vitamin D IU 457  Lipids  Fatty acids, total saturated g 6  Fatty acids, total monounsaturated g 6  Fatty acids, total polyunsaturated g 8  Cholesterol mg 123

## 2023-05-04 NOTE — Progress Notes (Signed)
 Initial Nutrition Assessment  DOCUMENTATION CODES:   Non-severe (moderate) malnutrition in context of chronic illness  INTERVENTION:   -Boost Breeze po TID, each supplement provides 250 kcal and 9 grams of protein   -B complex w/ C daily  -Provided " Pancreatitis Nutrition Therapy" handout in AVS -reviewed principles with patient  NUTRITION DIAGNOSIS:   Moderate Malnutrition related to chronic illness (pancreatitis) as evidenced by mild fat depletion, mild muscle depletion.  GOAL:   Patient will meet greater than or equal to 90% of their needs  MONITOR:   PO intake, Supplement acceptance  REASON FOR ASSESSMENT:   Malnutrition Screening Tool    ASSESSMENT:   53 y.o. male with past medical history significant for chronic pancreatitis secondary to alcohol abuse, HTN, DM2, gout, GERD, OA, H. pylori who presented with a long Hospital ED on 05/03/2023 with constant left upper quadrant pain over the last week.  Patient in room, reports he was made NPO d/t not tolerating his clear liquids this morning. No vomiting. Pt feels he ate too much of liquids then needed to take his medications. Pt had broth, 2 icees, and ginger ale.  GI in room during visit, advised that if pt is unable to tolerate PO in the next few days, pt may need to have feeding tube placed. Pt feels he just took in too much volume at one time. Pt to be placed back on clears. Pt last ate 3/30, ate some [place] tacos. Reports that he has experienced pain after eating beans, broccoli and dairy products, even Ensure. Reports he ate fat back cooked eggs one time and tolerated this but he did take Creon at the time.  Pt reports he needs dietary guidance for pancreatitis. Will provide with diet recommendation handout. Encouraged consistent use of his Creon.  Pt reports UBW ~220-225 lbs.  Per weight records, last weighed this much in November 2023. Weights have been trending down since until January. Pt was placed on Creon and  his weight has started an upward trend.  Current weight on bed scale (with blankets): 183 lbs  Medications: Creon, KLOR-CON  Labs reviewed: Low K   NUTRITION - FOCUSED PHYSICAL EXAM:  Flowsheet Row Most Recent Value  Orbital Region Mild depletion  Upper Arm Region Mild depletion  Thoracic and Lumbar Region No depletion  Buccal Region No depletion  Temple Region Mild depletion  Clavicle Bone Region Mild depletion  Clavicle and Acromion Bone Region Mild depletion  Scapular Bone Region Mild depletion  Dorsal Hand No depletion  Patellar Region Mild depletion  Anterior Thigh Region Mild depletion  Posterior Calf Region Mild depletion  Edema (RD Assessment) None  Hair Reviewed  Eyes Reviewed  [jaundice, spot on rt eye]  Mouth Reviewed  Skin Reviewed  Nails Reviewed       Diet Order:   Diet Order             Diet clear liquid Fluid consistency: Thin  Diet effective now                   EDUCATION NEEDS:   Education needs have been addressed  Skin:  Skin Assessment: Reviewed RN Assessment  Last BM:  3/31  Height:   Ht Readings from Last 1 Encounters:  05/03/23 6\' 1"  (1.854 m)    Weight:   Wt Readings from Last 1 Encounters:  05/03/23 83 kg    BMI:  Body mass index is 24.14 kg/m.  Estimated Nutritional Needs:   Kcal:  2200-2400  Protein:  115-125g  Fluid:  2.2L/day   Tilda Franco, MS, RD, LDN Inpatient Clinical Dietitian Contact via Secure chat

## 2023-05-04 NOTE — Progress Notes (Signed)
   05/04/23 1250  TOC Brief Assessment  Insurance and Status Reviewed  Patient has primary care physician Yes  Home environment has been reviewed Single family home  Prior level of function: independent  Prior/Current Home Services No current home services  Social Drivers of Health Review SDOH reviewed no interventions necessary  Readmission risk has been reviewed Yes  Transition of care needs no transition of care needs at this time

## 2023-05-04 NOTE — Progress Notes (Addendum)
 [facility] Progress Note  CC:   Abdominal pain, chronic pancreatitis   Subjective: He drank a lot of water with multiple pills/tablets this morning which resulted in worsening LUQ pain which abated after he received Oxycodone 5mg  po. Currently NPO. He wishes to slowly restart clear liquids and ensure clear. He does not tolerate dairy products including ensure. No N/V. No abdominal pain at this time. He passed a brown stool earlier today, no bloody or black stools. Registered dietician at the bedside.    Objective:  Vital signs in last 24 hours: Temp:  [98.1 F (36.7 C)-98.3 F (36.8 C)] 98.3 F (36.8 C) (04/01 0425) Pulse Rate:  [61-75] 75 (04/01 0427) Resp:  [17-18] 18 (04/01 0425) BP: (146-171)/(84-103) 153/93 (04/01 0427) SpO2:  [100 %] 100 % (04/01 0425) Last BM Date : 05/03/23 General: Alert, well-developed in NAD Heart: RRR, no murmurs.  Pulm: Breath sounds clear throughout.  Abdomen: Soft, nondistended. Mild LUQ tenderness without rebound or guarding. Positive bowel sounds x 4 quadrants. No bruit. No palpable mass.  Extremities:  No edema. Neurologic:  Alert and  oriented x 4. Grossly normal neurologically. Psych:  Alert and cooperative. Normal mood and affect.  Intake/Output from previous day: 03/31 0701 - 04/01 0700 In: 2014.4 [I.V.:2014.4] Out: -  Intake/Output this shift: No intake/output data recorded.  Lab Results: Recent Labs    05/03/23 0850 05/04/23 0536  WBC 5.1 3.9*  HGB 15.1 12.8*  HCT 44.7 38.2*  PLT 337 284   BMET Recent Labs    05/03/23 0850 05/04/23 0536  NA 134* 135  K 3.3* 3.1*  CL 98 102  CO2 26 21*  GLUCOSE 157* 83  BUN 10 9  CREATININE 0.78 0.52*  CALCIUM 9.4 8.7*   LFT Recent Labs    05/04/23 0536  PROT 5.9*  ALBUMIN 2.9*  AST 94*  ALT 157*  ALKPHOS 206*  BILITOT 8.1*  BILIDIR 5.2*   PT/INR No results for input(s): "LABPROT", "INR" in the last 72 hours. Hepatitis Panel No results for input(s):  "HEPBSAG", "HCVAB", "HEPAIGM", "HEPBIGM" in the last 72 hours.  MR ABDOMEN MRCP W WO CONTAST Result Date: 05/03/2023 CLINICAL DATA:  Acute on chronic pancreatitis, intermittent vomiting and abdominal pain EXAM: MRI ABDOMEN WITHOUT AND WITH CONTRAST (INCLUDING MRCP) TECHNIQUE: Multiplanar multisequence MR imaging of the abdomen was performed both before and after the administration of intravenous contrast. Heavily T2-weighted images of the biliary and pancreatic ducts were obtained, and three-dimensional MRCP images were rendered by post processing. CONTRAST:  8mL GADAVIST GADOBUTROL 1 MMOL/ML IV SOLN COMPARISON:  CT abdomen pelvis, 05/03/2023, 02/18/2023 FINDINGS: Lower chest: No acute abnormality. Hepatobiliary: No solid liver abnormality is seen. No gallstones. Similar, mild gallbladder wall thickening. Severe intra and extrahepatic biliary ductal dilatation, which is somewhat increased compared to examination dated 02/18/2023 common bile duct measuring 1.3 cm, previously 0.8 cm (series 3, image 15). Common bile duct is again nearly effaced within the superior pancreatic head. Pancreas: Severe stigmata of chronic pancreatitis, including multiple pseudocysts within the pancreatic head not significantly changed compared to examination dated 02/18/2023, and diffuse atrophy atrophy of the distal pancreas. Severe ductal dilatation, duct measuring up to 1.1 cm and, the main pancreatic duct effaced within the pancreatic head (series 4, image 18). Diffuse inflammatory fat stranding and fluid about the pancreas, predominantly of the pancreatic head, and to a lesser extent the adjacent retroperitoneum and small bowel mesentery. Spleen: Normal in size without significant abnormality. Adrenals/Urinary Tract: Adrenal glands are  unremarkable. Kidneys are normal, without renal calculi, solid lesion, or hydronephrosis. Stomach/Bowel: Stomach is within normal limits. No evidence of bowel wall thickening, distention, or  inflammatory changes. Vascular/Lymphatic: Aortic atherosclerosis. No enlarged abdominal lymph nodes. Other: No abdominal wall hernia or abnormality. No ascites. Musculoskeletal: No acute or significant osseous findings. IMPRESSION: 1. Severe stigmata of chronic pancreatitis, including multiple pseudocysts within the pancreatic head, not significantly changed compared to examination dated 02/18/2023, and diffuse atrophy of the distal pancreas. Severe pancreatic ductal dilatation, duct measuring up to 1.1 cm and, the main pancreatic duct effaced within the pancreatic head. 2. Diffuse inflammatory fat stranding and fluid about the pancreas, predominantly of the pancreatic head, and to a lesser extent the adjacent retroperitoneum and small bowel mesentery. Findings are consistent with acute on chronic pancreatitis. 3. Severe intra and extrahepatic biliary ductal dilatation, which is somewhat increased compared to examination dated 02/18/2023. Common bile duct is again nearly effaced within the superior pancreatic head. No choledocholithiasis. 4. Similar, mild gallbladder wall thickening. Aortic Atherosclerosis (ICD10-I70.0). Electronically Signed   By: Jearld Lesch M.D.   On: 05/03/2023 21:13   MR 3D Recon At Scanner Result Date: 05/03/2023 CLINICAL DATA:  Acute on chronic pancreatitis, intermittent vomiting and abdominal pain EXAM: MRI ABDOMEN WITHOUT AND WITH CONTRAST (INCLUDING MRCP) TECHNIQUE: Multiplanar multisequence MR imaging of the abdomen was performed both before and after the administration of intravenous contrast. Heavily T2-weighted images of the biliary and pancreatic ducts were obtained, and three-dimensional MRCP images were rendered by post processing. CONTRAST:  8mL GADAVIST GADOBUTROL 1 MMOL/ML IV SOLN COMPARISON:  CT abdomen pelvis, 05/03/2023, 02/18/2023 FINDINGS: Lower chest: No acute abnormality. Hepatobiliary: No solid liver abnormality is seen. No gallstones. Similar, mild gallbladder wall  thickening. Severe intra and extrahepatic biliary ductal dilatation, which is somewhat increased compared to examination dated 02/18/2023 common bile duct measuring 1.3 cm, previously 0.8 cm (series 3, image 15). Common bile duct is again nearly effaced within the superior pancreatic head. Pancreas: Severe stigmata of chronic pancreatitis, including multiple pseudocysts within the pancreatic head not significantly changed compared to examination dated 02/18/2023, and diffuse atrophy atrophy of the distal pancreas. Severe ductal dilatation, duct measuring up to 1.1 cm and, the main pancreatic duct effaced within the pancreatic head (series 4, image 18). Diffuse inflammatory fat stranding and fluid about the pancreas, predominantly of the pancreatic head, and to a lesser extent the adjacent retroperitoneum and small bowel mesentery. Spleen: Normal in size without significant abnormality. Adrenals/Urinary Tract: Adrenal glands are unremarkable. Kidneys are normal, without renal calculi, solid lesion, or hydronephrosis. Stomach/Bowel: Stomach is within normal limits. No evidence of bowel wall thickening, distention, or inflammatory changes. Vascular/Lymphatic: Aortic atherosclerosis. No enlarged abdominal lymph nodes. Other: No abdominal wall hernia or abnormality. No ascites. Musculoskeletal: No acute or significant osseous findings. IMPRESSION: 1. Severe stigmata of chronic pancreatitis, including multiple pseudocysts within the pancreatic head, not significantly changed compared to examination dated 02/18/2023, and diffuse atrophy of the distal pancreas. Severe pancreatic ductal dilatation, duct measuring up to 1.1 cm and, the main pancreatic duct effaced within the pancreatic head. 2. Diffuse inflammatory fat stranding and fluid about the pancreas, predominantly of the pancreatic head, and to a lesser extent the adjacent retroperitoneum and small bowel mesentery. Findings are consistent with acute on chronic  pancreatitis. 3. Severe intra and extrahepatic biliary ductal dilatation, which is somewhat increased compared to examination dated 02/18/2023. Common bile duct is again nearly effaced within the superior pancreatic head. No choledocholithiasis. 4. Similar, mild gallbladder wall  thickening. Aortic Atherosclerosis (ICD10-I70.0). Electronically Signed   By: Jearld Lesch M.D.   On: 05/03/2023 21:13   CT ABDOMEN PELVIS W CONTRAST Result Date: 05/03/2023 CLINICAL DATA:  Abdominal pain. EXAM: CT ABDOMEN AND PELVIS WITH CONTRAST TECHNIQUE: Multidetector CT imaging of the abdomen and pelvis was performed using the standard protocol following bolus administration of intravenous contrast. RADIATION DOSE REDUCTION: This exam was performed according to the departmental dose-optimization program which includes automated exposure control, adjustment of the mA and/or kV according to patient size and/or use of iterative reconstruction technique. CONTRAST:  OMNIPAQUE IOHEXOL 300 MG/ML  SOLN COMPARISON:  CT abdomen pelvis dated 02/18/2023. FINDINGS: Lower chest: The visualized lung bases are clear. No intra-abdominal free air.  Small free fluid in the pelvis. Hepatobiliary: The liver is unremarkable. Mild biliary dilatation. The gallbladder is distended. No calcified gallstone. Pancreas: There is inflammatory changes of the pancreas. Enlargement of the proximal pancreas predominantly involving the head and uncinate process with dilatation of the main pancreatic duct. Coarse calcifications of the proximal pancreas sequela of chronic pancreatitis. Similar appearance of small cystic structures in the head and uncinate process of the pancreas, likely pseudocysts. Spleen: Normal in size without focal abnormality. Adrenals/Urinary Tract: The adrenal glands are unremarkable. The kidneys, visualized ureters, and urinary bladder appear unremarkable. Stomach/Bowel: There is no bowel obstruction or active inflammation. The appendix is  normal. Vascular/Lymphatic: Mild aortoiliac atherosclerotic disease. The IVC is unremarkable. No portal venous gas. There is compression of the main portal vein by the inflammatory changes of the pancreas. No adenopathy. Reproductive: The prostate and seminal vesicles are grossly remarkable. Other: Intramuscular lipoma of the left groin. Musculoskeletal: Total right hip arthroplasty. No acute osseous pathology. IMPRESSION: 1. Acute on chronic pancreatitis. Similar small pseudocyst in the uncinate process and head of the pancreas. 2. No bowel obstruction. Normal appendix. Electronically Signed   By: Elgie Collard M.D.   On: 05/03/2023 13:30   Patient Profile: Evan Sullivan is a 53 y.o. male with a past medical history of arthritis, hypertension, diabetes mellitus type 2, gout, GERD, H. 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 Admitted 05/03/2023 with LUQ pain x 1 week secondary to acute on chronic pancreatitis.    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 S/P EUS at [facility] 04/12/2023 showed sonographic changes consistent with severe chronic pancreatitis, one abnormal lymph node was visualized in the peripancreatic region. FNA  was negative for malignancy. A repeat abdominal MRI/MRCP recommended in 6 to 8 weeks. He developed constant LUQ burning pain one week ago which has progressively worsened including one episode of vomiting up water earlier 3.31. Elevated LFTs. Lipase level 325. CTAP with contrast showed acute on chronic pancreatitis with similar small pseudocyst in the uncinate process and head of the pancreas. Abdominal MRI/MRCP 05/03/2023 showed severe stigmata of acute and chronic pancreatitis including multiple pseudocyst within the pancreatic head stable when compared to imaging 02/18/2023, diffuse atrophy of the distal pancreas with severe PD dilatation in the main PD effaced within the pancreatic head and severe intra/extrahepatic  biliary ductal dilatation which has increased when compared to prior imaging 02/18/2023 and the CBD is nearly effaced within the superior pancreatic head without evidence of choledocholithiasis. T. Bili 5.6 -> 8.1. Direct bili 5.2. Alk phos 265 -> 206. AST 157 -> 94. ALT 243 -> 157. Lipase 435 -> 78. WBC 5.1 -. 3.9. Hyperbilirubinemia likely secondary to biliary obstruction secondary to pancreatic edema and will hopefully  improve with supportive measures. No role for ERCP at this juncture.  -Slowly restart clear liquid diet, avoid taking multiple pills/tablets at one time. NPO if he is unable to tolerate clear liquids. May require enteral tube  feedings if unable to advance diet within the next few days -Appreciate RD recommendations  -IV fluids and pain management per the hospitalist -Hepatic panel in am -Creon 36K two capsule po tid with meals -Continue complete alcohol abstinence  -Await further recommendations per Dr. Marina Goodell    History of colon polyps. Colonoscopy 07/2021 identified 1 tubular adenomatous and 1 hyperplastic polyp removed from the colon. -Next colon polyp surveillance colonoscopy due 07/2028   History of GERD. H. pylori gastritis per EGD 07/2021, treated with successful eradication. -Pantoprazole 40mg  every day  Hypokalemia. K+ 3.1.  -Management per the hospitalist  Normocytic anemia. Hg 15.1 -> 12.8, suspect dilutional component. No overt GI  bleeding.  -CBC in am     Principal Problem:   Acute pancreatitis after endoscopic retrograde cholangiopancreatography (ERCP) Active Problems:   Acute on chronic pancreatitis (HCC)   Upper abdominal pain   Cholestasis   Abnormal CT of the abdomen     LOS: 1 day   Evan Sullivan  05/04/2023, 12:06 PM  GI ATTENDING  Interval history and data reviewed.  Patient personally seen and examined.  Agree with comprehensive interval progress note as outlined above.  Patient's wife in room.  Overall he feels better.  No vomiting.   Tolerating limited diet.  Asking for more pain meds, though looks comfortable.  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  Treatment is supportive.  Not certain how helpful Creon is for this condition.  Exam is benign.  Will advance diet and continue to monitor.  Ultimately, he will follow-up with [facility] pancreatic/biliary service  Wilhemina Bonito. Eda Keys., M.D. [facility] Division of Gastroenterology

## 2023-05-04 NOTE — Progress Notes (Signed)
 PROGRESS NOTE    Evan Sullivan  ZOX:[id number] DOB: 03/09/1970 DOA: 05/03/2023 PCP: Sandre Kitty, MD    Brief Narrative:   Evan Sullivan is a 53 y.o. male with past medical history significant for chronic pancreatitis secondary to alcohol abuse, HTN, DM2, gout, GERD, OA, H. pylori who presented with a long Hospital ED on 05/03/2023 with constant left upper quadrant pain over the last week.  Onset following recent ERCP at [facility].  Additionally reports episode of vomiting after taking his morning medications without hematemesis.  Denies any blood in the stool but reports that his urine has been looking darker.  Denies NSAID abuse and last alcohol intake was 7-8 months prior.  Does endorse smoking marijuana daily.  Follows with [place] [facility], Dr. Tomasa Rand with recent referral to [facility] in which she underwent EUS on 04/12/2023 showing sonographic changes consistent with severe chronic pancreatitis s/p biopsy with FNA negative for malignancy.  Recommended repeat abdominal MRCP 6/8 weeks.    In the ED, temperature 98.2 F, HR 72, RR 19, BP 182/110, SpO2 100% on room air.  WBC 5.1, hemoglobin 15.1, platelet count 337.  Sodium 134, potassium 3.3, chloride 98, CO2 26, glucose 157, BUN 10, creatinine 0.78.  Alkaline phosphatase 265, AST 157, ALT 243, total bilirubin 5.6.  Lipase 435.  CT abdomen/pelvis with contrast with acute on chronic pancreatitis, similar small pseudocyst in the uncinate process and head of pancreas, no bowel obstruction, normal appendix. MRCP with severe stigmata of chronic pancreatitis including multiple pseudocyst within the pancreatic head, diffuse atrophy distal pancreas, severe pancreatic ductal dilation measuring 1.1 cm and main pancreatic duct effaced within the pancreatic head, diffuse inflammatory fat stranding and fluid about the pancreas, severe intra and extrahepatic biliary ductal dilation, somewhat increased from prior exam and, bile duct is again nearly  effaced within the superior pancreatic head, no choledocholithiasis, mild gallbladder wall thickening similar in appearance to prior.  [place] gastroenterology consulted.  TRH consulted for admission for acute on chronic pancreatitis, CBD/pancreatic duct obstruction  Assessment & Plan:   Acute on chronic pancreatitis Elevated LFTs/bilirubin with findings concerning for pancreatic duct/CBD obstruction Patient presenting to ED with progressive abdominal pain associated with vomiting.  Recent ERCP at [facility] 3/10.  Notably elevated LFTs, total bilirubin with MRCP notable for acute on chronic pancreatitis with multiple pseudocysts, pancreatic ductal dilation, severe intra/extrahepatic biliary duct dilation, common bile duct nearly effaced. --[place] gastro[facility]ology following, appreciate assistance -- AST 157>94 -- ALT 243>157 -- Tbili 5.6>8.1 -- Lipase 435>78 -- Clear liquid diet, Creon 36K units TIDAC -- BMP, LFTs, lipase daily -- Further per gastroenterology  Hypokalemia Potassium 3.1, will replete. -- Repeat BMP, Mag level in am  Hyponatremia: Resolved Sodium 134 on admission, likely secondary to dehydration in the setting of poor oral intake in the days preceding hospitalization.  Supported with IV fluid hydration with improvement/resolution. -- Na 134>135 -- BMP daily  Essential hypertension -- Atenolol 100 mg p.o. daily -- Amlodipine 10 mg p.o. daily  DM2 Hemoglobin A1c 6.8 on 11/03/2022, diet controlled at baseline. -- CBG qAM  Gout Currently managed with colchicine as needed outpatient.  Dyslipidemia -- Fenofibrate 54 mg PO daily  GERD History of H. Pylori Patient with history of H. pylori, completed treatment with Protonix/Pepto/metronidazole/doxycycline x 14 days 2023. --Protonix 40 mg p.o. daily  History of polysubstance abuse with alcohol/THC Patient denies any recent alcohol use, last known use 7-8 months ago.  But does endorse daily use of THC.  Counseled on need  for complete cessation/abstinence   DVT prophylaxis: enoxaparin (LOVENOX) injection 40 mg Start: 05/03/23 2200 SCDs Start: 05/03/23 1448    Code Status: Full Code Family Communication: No family present at bedside this morning  Disposition Plan:  Level of care: Med-Surg Status is: Inpatient Remains inpatient appropriate because: Needs further advancement of diet with toleration, further input from gastroenterology    Consultants:  [place] gastroenterology  Procedures:  None  Antimicrobials:  None   Subjective: Patient seen examined bedside, lying in bed.  Abdominal pain improved.  Started on clear liquid diet.  Discussed findings of MRCP with patient, he reported that he was sent to [facility] for hopeful stent placement but unfortunately was not performed.  Bilirubin worsening this morning.  Awaiting further recommendations from GI.  Denies headache, no dizziness, no chest pain, no palpitations, no shortness of breath, no visual disturbance, no current nausea/vomiting, no diarrhea, no fever/chills/night sweats, no focal weakness, no fatigue, no paresthesia.  No acute events overnight per nurse staff.  Objective: Vitals:   05/03/23 1616 05/04/23 0009 05/04/23 0425 05/04/23 0427  BP: (!) 163/102 (!) 146/84 (!) 169/103 (!) 153/93  Pulse: 61 63 69 75  Resp:  17 18   Temp: 98.2 F (36.8 C) 98.2 F (36.8 C) 98.3 F (36.8 C)   TempSrc: Oral Oral Oral   SpO2: 100% 100% 100%   Weight:      Height:        Intake/Output Summary (Last 24 hours) at 05/04/2023 1037 Last data filed at 05/04/2023 0532 Gross per 24 hour  Intake 2014.38 ml  Output --  Net 2014.38 ml   Filed Weights   05/03/23 0848  Weight: 83 kg    Examination:  Physical Exam: GEN: NAD, alert and oriented x 3, wd/wn HEENT: NCAT, PERRL, EOMI, + scleral icterus, MMM PULM: CTAB w/o wheezes/crackles, normal respiratory effort, on room air CV: RRR w/o M/G/R GI: abd soft, NTND, + BS, no R/G/M MSK: no peripheral edema,  muscle strength globally intact 5/5 bilateral upper/lower extremities NEURO: CN II-XII intact, no focal deficits, sensation to light touch intact PSYCH: normal mood/affect Integumentary: dry/intact, no rashes or wounds    Data Reviewed: I have personally reviewed following labs and imaging studies  CBC: Recent Labs  Lab 05/03/23 0850 05/04/23 0536  WBC 5.1 3.9*  HGB 15.1 12.8*  HCT 44.7 38.2*  MCV 91.0 91.8  PLT 337 284   Basic Metabolic Panel: Recent Labs  Lab 05/03/23 0850 05/03/23 0905 05/04/23 0536  NA 134*  --  135  K 3.3*  --  3.1*  CL 98  --  102  CO2 26  --  21*  GLUCOSE 157*  --  83  BUN 10  --  9  CREATININE 0.78  --  0.52*  CALCIUM 9.4  --  8.7*  MG  --  1.8  --    GFR: Estimated Creatinine Clearance: 123.5 mL/min (A) (by C-G formula based on SCr of 0.52 mg/dL (L)). Liver Function Tests: Recent Labs  Lab 05/03/23 0850 05/04/23 0536  AST 157* 94*  ALT 243* 157*  ALKPHOS 265* 206*  BILITOT 5.6* 8.1*  PROT 7.2 5.9*  ALBUMIN 3.5 2.9*   Recent Labs  Lab 05/03/23 0850 05/04/23 0536  LIPASE 435* 78*   No results for input(s): "AMMONIA" in the last 168 hours. Coagulation Profile: No results for input(s): "INR", "PROTIME" in the last 168 hours. Cardiac Enzymes: No results for input(s): "CKTOTAL", "CKMB", "CKMBINDEX", "TROPONINI" in the last 168 hours.  BNP (last 3 results) No results for input(s): "PROBNP" in the last 8760 hours. HbA1C: No results for input(s): "HGBA1C" in the last 72 hours. CBG: No results for input(s): "GLUCAP" in the last 168 hours. Lipid Profile: No results for input(s): "CHOL", "HDL", "LDLCALC", "TRIG", "CHOLHDL", "LDLDIRECT" in the last 72 hours. Thyroid Function Tests: No results for input(s): "TSH", "T4TOTAL", "FREET4", "T3FREE", "THYROIDAB" in the last 72 hours. Anemia Panel: No results for input(s): "VITAMINB12", "FOLATE", "FERRITIN", "TIBC", "IRON", "RETICCTPCT" in the last 72 hours. Sepsis Labs: No results for  input(s): "PROCALCITON", "LATICACIDVEN" in the last 168 hours.  No results found for this or any previous visit (from the past 240 hours).       Radiology Studies: MR ABDOMEN MRCP W WO CONTAST Result Date: 05/03/2023 CLINICAL DATA:  Acute on chronic pancreatitis, intermittent vomiting and abdominal pain EXAM: MRI ABDOMEN WITHOUT AND WITH CONTRAST (INCLUDING MRCP) TECHNIQUE: Multiplanar multisequence MR imaging of the abdomen was performed both before and after the administration of intravenous contrast. Heavily T2-weighted images of the biliary and pancreatic ducts were obtained, and three-dimensional MRCP images were rendered by post processing. CONTRAST:  8mL GADAVIST GADOBUTROL 1 MMOL/ML IV SOLN COMPARISON:  CT abdomen pelvis, 05/03/2023, 02/18/2023 FINDINGS: Lower chest: No acute abnormality. Hepatobiliary: No solid liver abnormality is seen. No gallstones. Similar, mild gallbladder wall thickening. Severe intra and extrahepatic biliary ductal dilatation, which is somewhat increased compared to examination dated 02/18/2023 common bile duct measuring 1.3 cm, previously 0.8 cm (series 3, image 15). Common bile duct is again nearly effaced within the superior pancreatic head. Pancreas: Severe stigmata of chronic pancreatitis, including multiple pseudocysts within the pancreatic head not significantly changed compared to examination dated 02/18/2023, and diffuse atrophy atrophy of the distal pancreas. Severe ductal dilatation, duct measuring up to 1.1 cm and, the main pancreatic duct effaced within the pancreatic head (series 4, image 18). Diffuse inflammatory fat stranding and fluid about the pancreas, predominantly of the pancreatic head, and to a lesser extent the adjacent retroperitoneum and small bowel mesentery. Spleen: Normal in size without significant abnormality. Adrenals/Urinary Tract: Adrenal glands are unremarkable. Kidneys are normal, without renal calculi, solid lesion, or hydronephrosis.  Stomach/Bowel: Stomach is within normal limits. No evidence of bowel wall thickening, distention, or inflammatory changes. Vascular/Lymphatic: Aortic atherosclerosis. No enlarged abdominal lymph nodes. Other: No abdominal wall hernia or abnormality. No ascites. Musculoskeletal: No acute or significant osseous findings. IMPRESSION: 1. Severe stigmata of chronic pancreatitis, including multiple pseudocysts within the pancreatic head, not significantly changed compared to examination dated 02/18/2023, and diffuse atrophy of the distal pancreas. Severe pancreatic ductal dilatation, duct measuring up to 1.1 cm and, the main pancreatic duct effaced within the pancreatic head. 2. Diffuse inflammatory fat stranding and fluid about the pancreas, predominantly of the pancreatic head, and to a lesser extent the adjacent retroperitoneum and small bowel mesentery. Findings are consistent with acute on chronic pancreatitis. 3. Severe intra and extrahepatic biliary ductal dilatation, which is somewhat increased compared to examination dated 02/18/2023. Common bile duct is again nearly effaced within the superior pancreatic head. No choledocholithiasis. 4. Similar, mild gallbladder wall thickening. Aortic Atherosclerosis (ICD10-I70.0). Electronically Signed   By: Jearld Lesch M.D.   On: 05/03/2023 21:13   MR 3D Recon At Scanner Result Date: 05/03/2023 CLINICAL DATA:  Acute on chronic pancreatitis, intermittent vomiting and abdominal pain EXAM: MRI ABDOMEN WITHOUT AND WITH CONTRAST (INCLUDING MRCP) TECHNIQUE: Multiplanar multisequence MR imaging of the abdomen was performed both before and after the administration of intravenous contrast.  Heavily T2-weighted images of the biliary and pancreatic ducts were obtained, and three-dimensional MRCP images were rendered by post processing. CONTRAST:  8mL GADAVIST GADOBUTROL 1 MMOL/ML IV SOLN COMPARISON:  CT abdomen pelvis, 05/03/2023, 02/18/2023 FINDINGS: Lower chest: No acute  abnormality. Hepatobiliary: No solid liver abnormality is seen. No gallstones. Similar, mild gallbladder wall thickening. Severe intra and extrahepatic biliary ductal dilatation, which is somewhat increased compared to examination dated 02/18/2023 common bile duct measuring 1.3 cm, previously 0.8 cm (series 3, image 15). Common bile duct is again nearly effaced within the superior pancreatic head. Pancreas: Severe stigmata of chronic pancreatitis, including multiple pseudocysts within the pancreatic head not significantly changed compared to examination dated 02/18/2023, and diffuse atrophy atrophy of the distal pancreas. Severe ductal dilatation, duct measuring up to 1.1 cm and, the main pancreatic duct effaced within the pancreatic head (series 4, image 18). Diffuse inflammatory fat stranding and fluid about the pancreas, predominantly of the pancreatic head, and to a lesser extent the adjacent retroperitoneum and small bowel mesentery. Spleen: Normal in size without significant abnormality. Adrenals/Urinary Tract: Adrenal glands are unremarkable. Kidneys are normal, without renal calculi, solid lesion, or hydronephrosis. Stomach/Bowel: Stomach is within normal limits. No evidence of bowel wall thickening, distention, or inflammatory changes. Vascular/Lymphatic: Aortic atherosclerosis. No enlarged abdominal lymph nodes. Other: No abdominal wall hernia or abnormality. No ascites. Musculoskeletal: No acute or significant osseous findings. IMPRESSION: 1. Severe stigmata of chronic pancreatitis, including multiple pseudocysts within the pancreatic head, not significantly changed compared to examination dated 02/18/2023, and diffuse atrophy of the distal pancreas. Severe pancreatic ductal dilatation, duct measuring up to 1.1 cm and, the main pancreatic duct effaced within the pancreatic head. 2. Diffuse inflammatory fat stranding and fluid about the pancreas, predominantly of the pancreatic head, and to a lesser extent  the adjacent retroperitoneum and small bowel mesentery. Findings are consistent with acute on chronic pancreatitis. 3. Severe intra and extrahepatic biliary ductal dilatation, which is somewhat increased compared to examination dated 02/18/2023. Common bile duct is again nearly effaced within the superior pancreatic head. No choledocholithiasis. 4. Similar, mild gallbladder wall thickening. Aortic Atherosclerosis (ICD10-I70.0). Electronically Signed   By: Jearld Lesch M.D.   On: 05/03/2023 21:13   CT ABDOMEN PELVIS W CONTRAST Result Date: 05/03/2023 CLINICAL DATA:  Abdominal pain. EXAM: CT ABDOMEN AND PELVIS WITH CONTRAST TECHNIQUE: Multidetector CT imaging of the abdomen and pelvis was performed using the standard protocol following bolus administration of intravenous contrast. RADIATION DOSE REDUCTION: This exam was performed according to the departmental dose-optimization program which includes automated exposure control, adjustment of the mA and/or kV according to patient size and/or use of iterative reconstruction technique. CONTRAST:  OMNIPAQUE IOHEXOL 300 MG/ML  SOLN COMPARISON:  CT abdomen pelvis dated 02/18/2023. FINDINGS: Lower chest: The visualized lung bases are clear. No intra-abdominal free air.  Small free fluid in the pelvis. Hepatobiliary: The liver is unremarkable. Mild biliary dilatation. The gallbladder is distended. No calcified gallstone. Pancreas: There is inflammatory changes of the pancreas. Enlargement of the proximal pancreas predominantly involving the head and uncinate process with dilatation of the main pancreatic duct. Coarse calcifications of the proximal pancreas sequela of chronic pancreatitis. Similar appearance of small cystic structures in the head and uncinate process of the pancreas, likely pseudocysts. Spleen: Normal in size without focal abnormality. Adrenals/Urinary Tract: The adrenal glands are unremarkable. The kidneys, visualized ureters, and urinary bladder  appear unremarkable. Stomach/Bowel: There is no bowel obstruction or active inflammation. The appendix is normal. Vascular/Lymphatic: Mild aortoiliac  atherosclerotic disease. The IVC is unremarkable. No portal venous gas. There is compression of the main portal vein by the inflammatory changes of the pancreas. No adenopathy. Reproductive: The prostate and seminal vesicles are grossly remarkable. Other: Intramuscular lipoma of the left groin. Musculoskeletal: Total right hip arthroplasty. No acute osseous pathology. IMPRESSION: 1. Acute on chronic pancreatitis. Similar small pseudocyst in the uncinate process and head of the pancreas. 2. No bowel obstruction. Normal appendix. Electronically Signed   By: Elgie Collard M.D.   On: 05/03/2023 13:30        Scheduled Meds:  amLODipine  10 mg Oral Daily   atenolol  100 mg Oral Daily   enoxaparin (LOVENOX) injection  40 mg Subcutaneous Q24H   fenofibrate  54 mg Oral Daily   lipase/protease/amylase  36,000 Units Oral TID WC   pantoprazole  40 mg Oral Daily   potassium chloride  30 mEq Oral Q3H   Continuous Infusions:  sodium chloride 150 mL/hr at 05/04/23 0621     LOS: 1 day    Time spent: 52 minutes spent on 05/04/2023 caring for this patient face-to-face including chart review, ordering labs/tests, documenting, discussion with nursing staff, consultants, updating family and interview/physical exam    Alvira Philips [place], DO [facility] Available via Epic secure chat 7am-7pm After these hours, please refer to coverage provider listed on [email or web address] 05/04/2023, 10:37 AM

## 2023-05-05 DIAGNOSIS — I10 Essential (primary) hypertension: Secondary | ICD-10-CM | POA: Diagnosis not present

## 2023-05-05 DIAGNOSIS — E119 Type 2 diabetes mellitus without complications: Secondary | ICD-10-CM

## 2023-05-05 DIAGNOSIS — K860 Alcohol-induced chronic pancreatitis: Secondary | ICD-10-CM | POA: Diagnosis not present

## 2023-05-05 DIAGNOSIS — E782 Mixed hyperlipidemia: Secondary | ICD-10-CM

## 2023-05-05 DIAGNOSIS — K9189 Other postprocedural complications and disorders of digestive system: Secondary | ICD-10-CM | POA: Diagnosis not present

## 2023-05-05 DIAGNOSIS — F1210 Cannabis abuse, uncomplicated: Secondary | ICD-10-CM

## 2023-05-05 DIAGNOSIS — K219 Gastro-esophageal reflux disease without esophagitis: Secondary | ICD-10-CM

## 2023-05-05 DIAGNOSIS — K831 Obstruction of bile duct: Secondary | ICD-10-CM

## 2023-05-05 DIAGNOSIS — E441 Mild protein-calorie malnutrition: Secondary | ICD-10-CM

## 2023-05-05 DIAGNOSIS — R1012 Left upper quadrant pain: Secondary | ICD-10-CM | POA: Diagnosis not present

## 2023-05-05 DIAGNOSIS — F1090 Alcohol use, unspecified, uncomplicated: Secondary | ICD-10-CM | POA: Diagnosis not present

## 2023-05-05 DIAGNOSIS — E440 Moderate protein-calorie malnutrition: Secondary | ICD-10-CM | POA: Insufficient documentation

## 2023-05-05 DIAGNOSIS — E876 Hypokalemia: Secondary | ICD-10-CM

## 2023-05-05 DIAGNOSIS — K8681 Exocrine pancreatic insufficiency: Secondary | ICD-10-CM | POA: Diagnosis not present

## 2023-05-05 DIAGNOSIS — E1169 Type 2 diabetes mellitus with other specified complication: Secondary | ICD-10-CM | POA: Insufficient documentation

## 2023-05-05 DIAGNOSIS — K861 Other chronic pancreatitis: Secondary | ICD-10-CM

## 2023-05-05 DIAGNOSIS — K859 Acute pancreatitis without necrosis or infection, unspecified: Secondary | ICD-10-CM | POA: Diagnosis not present

## 2023-05-05 LAB — BASIC METABOLIC PANEL WITH GFR
Anion gap: 10 (ref 5–15)
BUN: 7 mg/dL (ref 6–20)
CO2: 22 mmol/L (ref 22–32)
Calcium: 9 mg/dL (ref 8.9–10.3)
Chloride: 103 mmol/L (ref 98–111)
Creatinine, Ser: 0.48 mg/dL — ABNORMAL LOW (ref 0.61–1.24)
GFR, Estimated: >60 mL/min (ref >60)
Glucose, Bld: 124 mg/dL — ABNORMAL HIGH (ref 70–99)
Potassium: 3.3 mmol/L — ABNORMAL LOW (ref 3.5–5.1)
Sodium: 135 mmol/L (ref 135–145)

## 2023-05-05 LAB — CBC
HCT: 42.1 % (ref 39.0–52.0)
Hemoglobin: 14 g/dL (ref 13.0–17.0)
MCH: 30.7 pg (ref 26.0–34.0)
MCHC: 33.3 g/dL (ref 30.0–36.0)
MCV: 92.3 fL (ref 80.0–100.0)
Platelets: 319 10*3/uL (ref 150–400)
RBC: 4.56 MIL/uL (ref 4.22–5.81)
RDW: 12.6 % (ref 11.5–15.5)
WBC: 4.1 10*3/uL (ref 4.0–10.5)
nRBC: 0 % (ref 0.0–0.2)

## 2023-05-05 LAB — HEPATIC FUNCTION PANEL
ALT: 183 U/L — ABNORMAL HIGH (ref 0–44)
AST: 159 U/L — ABNORMAL HIGH (ref 15–41)
Albumin: 3.2 g/dL — ABNORMAL LOW (ref 3.5–5.0)
Alkaline Phosphatase: 276 U/L — ABNORMAL HIGH (ref 38–126)
Bilirubin, Direct: 5.5 mg/dL — ABNORMAL HIGH (ref 0.0–0.2)
Indirect Bilirubin: 3 mg/dL — ABNORMAL HIGH (ref 0.3–0.9)
Total Bilirubin: 8.5 mg/dL — ABNORMAL HIGH (ref 0.0–1.2)
Total Protein: 6.8 g/dL (ref 6.5–8.1)

## 2023-05-05 LAB — MAGNESIUM: Magnesium: 1.8 mg/dL (ref 1.7–2.4)

## 2023-05-05 LAB — LIPASE, BLOOD: Lipase: 93 U/L — ABNORMAL HIGH (ref 11–51)

## 2023-05-05 MED ORDER — POTASSIUM CHLORIDE CRYS ER 20 MEQ PO TBCR
40.0000 meq | EXTENDED_RELEASE_TABLET | Freq: Once | ORAL | Status: AC
  Administered 2023-05-05: 40 meq via ORAL
  Filled 2023-05-05: qty 2

## 2023-05-05 NOTE — Assessment & Plan Note (Signed)
 Patient been placed on Accu-Cheks before every meal and nightly with sliding scale insulin Hemoglobin A1C 6.8% Diabetic Diet

## 2023-05-05 NOTE — Progress Notes (Signed)
 PROGRESS NOTE   Evan Sullivan  ZHY:[id number] DOB: Jun 19, 1970 DOA: 05/03/2023 PCP: Sandre Kitty, MD   Date of Service: the patient was seen and examined on 05/05/2023  Brief Narrative:  53 y.o. male with past medical history significant for chronic pancreatitis secondary to alcohol abuse, daily marijuana abuse, HTN, DM2, gout, GERD, OA, H. pylori who presented with a long Hospital ED on 05/03/2023 with constant left upper quadrant pain over the last week after a recent ERCP at [facility].     Of note, follows with [place] gastro[facility]ology, Dr. Tomasa Rand with recent referral to [facility] in which she underwent EUS on 04/12/2023 showing sonographic changes consistent with severe chronic pancreatitis s/p biopsy with FNA negative for malignancy.  Recommended repeat abdominal MRCP 6/8 weeks.    Upon evaluation in the emergency department patient was found to have a lipase of 435.  CT imaging of the abdomen and pelvis revealed evidence of acute on chronic pancreatitis.  MRCP revealed severe stigmata of chronic pancreatitis including multiple pseudocyst in the pancreatic head and findings concerning for severe intra and extrahepatic biliary ductal dilation.  The hospitalist group was then called to assess the patient for admission the hospital.  Gastroenterology was consulted.  Patient was kept n.p.o. initially with intravenous fluids.  Upon gradual mild clinical improvement a clear liquid diet was initiated and advanced as tolerated.  Eventual plan for patient to follow-up with [facility] pancreatic/biliary service after discharge.   Assessment & Plan Acute pancreatitis after endoscopic retrograde cholangiopancreatography (ERCP) Recent ERCP at [facility] 3/10 MRCP here notable for acute on chronic pancreatitis with multiple pseudocysts pancreatic ductal dilation, severe intra/extrahepatic biliary duct dilatation and common bile duct nearly effaced.  gastroenterology following, assistance is  appreciated Persisting jaundice, scleral icterus, rising bilirubin Continue supportive care, serial LFTs Lipase downtrending Continuing Creon per home regimen Will likely be discharged in the next 24 to 48 hours with outpatient follow-up with [facility] biliary service Acute on chronic pancreatitis (HCC) See assessment and plan above Obstructive jaundice Severe intra and extrahepatic biliary duct dilatation and common bile duct nearly effaced Bilirubin rising, see notations above Malnutrition of moderate degree Nutrition following, their input is appreciated Continue supplements daily Continue Creon supplements Continue B complex Hypokalemia Recurrent Replacing with potassium chloride Monitor potassium levels with serial chemistries Essential hypertension Atenolol 100 mg daily Amlodipine 10 mg daily Type 2 diabetes mellitus without complication, without long-term current use of insulin (HCC) Patient been placed on Accu-Cheks before every meal and nightly with sliding scale insulin Hemoglobin A1C 6.8% Diabetic Diet  Mixed diabetic hyperlipidemia associated with type 2 diabetes mellitus (HCC) Continue fenofibrate 54 mg daily GERD without esophagitis Continue home regimen of Protonix 40 mg daily History of H pylori infection status post treatment 2023 Marijuana abuse Admits to daily marijuana use Counseling on cessation   Subjective:  Patient complains of persisting dark urine, yellow skin and yellow eyes.  Patient denies significant abdominal pain.  Appetite is tenuous  Physical Exam:  Vitals:   05/05/23 0351 05/05/23 0917 05/05/23 1224 05/05/23 2014  BP: (!) 149/97 (!) 165/100 (!) 157/102 (!) 143/93  Pulse: 65  70 65  Resp: 18  18 18   Temp: 98.6 F (37 C)  98.6 F (37 C) 98.3 F (36.8 C)  TempSrc: Oral  Oral   SpO2: 100%  100% 100%  Weight:      Height:        Constitutional: Awake alert and oriented x3, no associated distress.   Skin: Notable  jaundice, poor skin  turgor noted. Eyes: Pupils are equally reactive to light.  Scleral icterus noted. ENMT: Moist mucous membranes noted.  Posterior pharynx clear of any exudate or lesions.   Respiratory: clear to auscultation bilaterally, no wheezing, no crackles. Normal respiratory effort. No accessory muscle use.  Cardiovascular: Regular rate and rhythm, no murmurs / rubs / gallops. No extremity edema. 2+ pedal pulses. No carotid bruits.  Abdomen: Epigastric tenderness.  Abdomen is soft.  No evidence of intra-abdominal masses.  Positive bowel sounds noted in all quadrants.   Musculoskeletal: No joint deformity upper and lower extremities. Good ROM, no contractures. Normal muscle tone.    Data Reviewed:  I have personally reviewed and interpreted labs, imaging.  Significant findings are   CBC: Recent Labs  Lab 05/03/23 0850 05/04/23 0536 05/05/23 0544  WBC 5.1 3.9* 4.1  HGB 15.1 12.8* 14.0  HCT 44.7 38.2* 42.1  MCV 91.0 91.8 92.3  PLT 337 284 319   Basic Metabolic Panel: Recent Labs  Lab 05/03/23 0850 05/03/23 0905 05/04/23 0536 05/05/23 0544  NA 134*  --  135 135  K 3.3*  --  3.1* 3.3*  CL 98  --  102 103  CO2 26  --  21* 22  GLUCOSE 157*  --  83 124*  BUN 10  --  9 7  CREATININE 0.78  --  0.52* 0.48*  CALCIUM 9.4  --  8.7* 9.0  MG  --  1.8  --  1.8   GFR: Estimated Creatinine Clearance: 123.5 mL/min (A) (by C-G formula based on SCr of 0.48 mg/dL (L)). Liver Function Tests: Recent Labs  Lab 05/03/23 0850 05/04/23 0536 05/05/23 0544  AST 157* 94* 159*  ALT 243* 157* 183*  ALKPHOS 265* 206* 276*  BILITOT 5.6* 8.1* 8.5*  PROT 7.2 5.9* 6.8  ALBUMIN 3.5 2.9* 3.2*    Code Status:  Full code.  Code status decision has been confirmed with: patient   Severity of Illness:  The appropriate patient status for this patient is INPATIENT. Inpatient status is judged to be reasonable and necessary in order to provide the required intensity of service to ensure the patient's safety. The  patient's presenting symptoms, physical exam findings, and initial radiographic and laboratory data in the context of their chronic comorbidities is felt to place them at high risk for further clinical deterioration. Furthermore, it is not anticipated that the patient will be medically stable for discharge from the hospital within 2 midnights of admission.   * I certify that at the point of admission it is my clinical judgment that the patient will require inpatient hospital care spanning beyond 2 midnights from the point of admission due to high intensity of service, high risk for further deterioration and high frequency of surveillance required.*  Time spent:  45 minutes  Author:  Marinda Elk MD  05/05/2023 9:06 PM

## 2023-05-05 NOTE — Assessment & Plan Note (Signed)
 Continue home regimen of Protonix 40 mg daily History of H pylori infection status post treatment 2023

## 2023-05-05 NOTE — Assessment & Plan Note (Signed)
 Severe intra and extrahepatic biliary duct dilatation and common bile duct nearly effaced Bilirubin rising, see notations above

## 2023-05-05 NOTE — Assessment & Plan Note (Signed)
 Recent ERCP at [facility] 3/10 MRCP here notable for acute on chronic pancreatitis with multiple pseudocysts pancreatic ductal dilation, severe intra/extrahepatic biliary duct dilatation and common bile duct nearly effaced. [place] gastro[facility] following, assistance is appreciated Persisting jaundice, scleral icterus, rising bilirubin Continue supportive care, serial LFTs Lipase downtrending Continuing Creon per home regimen Will likely be discharged in the next 24 to 48 hours with outpatient follow-up with [facility] biliary service

## 2023-05-05 NOTE — Plan of Care (Signed)

## 2023-05-05 NOTE — Assessment & Plan Note (Signed)
>>  ASSESSMENT AND PLAN FOR MALNUTRITION OF MODERATE DEGREE WRITTEN ON 05/05/2023  9:18 PM BY True Fuss, MD  Nutrition following, their input is appreciated Continue supplements daily Continue Creon  supplements Continue B complex

## 2023-05-05 NOTE — Assessment & Plan Note (Signed)
 Recurrent Replacing with potassium chloride Monitor potassium levels with serial chemistries

## 2023-05-05 NOTE — Hospital Course (Addendum)
 53 y.o. male with past medical history significant for chronic pancreatitis secondary to alcohol abuse, daily marijuana abuse, HTN, DM2, gout, GERD, OA, H. pylori who presented with a long Hospital ED on 05/03/2023 with constant left upper quadrant pain over the last week after a recent ERCP at [facility].     Of note, follows with [facility], Dr. Tomasa Rand with recent referral to [facility] in which she underwent EUS on 04/12/2023 showing sonographic changes consistent with severe chronic pancreatitis s/p biopsy with FNA negative for malignancy.  Recommended repeat abdominal MRCP 6/8 weeks.    Upon evaluation in the emergency department patient was found to have a lipase of 435.  CT imaging of the abdomen and pelvis revealed evidence of acute on chronic pancreatitis.  MRCP revealed severe stigmata of chronic pancreatitis including multiple pseudocyst in the pancreatic head and findings concerning for severe intra and extrahepatic biliary ductal dilation.  The hospitalist group was then called to assess the patient for admission the hospital.  Gastroenterology was consulted.  Patient was kept n.p.o. initially with intravenous fluids.  Upon gradual mild clinical improvement a clear liquid diet was initiated and advanced as tolerated.  Eventual plan for patient to follow-up with [facility] pancreatic/biliary service after discharge.

## 2023-05-05 NOTE — Assessment & Plan Note (Signed)
 Admits to daily marijuana use Counseling on cessation

## 2023-05-05 NOTE — Assessment & Plan Note (Signed)
 Continue fenofibrate 54 mg daily

## 2023-05-05 NOTE — Progress Notes (Addendum)
  Gastroenterology Progress Note  CC:   Abdominal pain, chronic pancreatitis     Subjective: He stated feeling much better today. He is tolerating a solid diet, ate eggs, pancakes and a small amount of sausage this morning, had brief epigastric pain for 10 to 15 minutes after breakfast which abated. Last received Dilaudid yesterday at 12:30pm and Oxycodone at 4pm.  No N/V. He passed a loose stool x 1 followed by 2 solid stools. No bloody or black stools. No CP or SOB.    Objective:  Vital signs in last 24 hours: Temp:  [97.7 F (36.5 C)-98.6 F (37 C)] 98.6 F (37 C) (04/02 0351) Pulse Rate:  [58-71] 65 (04/02 0351) Resp:  [16-18] 18 (04/02 0351) BP: (149-177)/(84-108) 165/100 (04/02 0917) SpO2:  [100 %] 100 % (04/02 0351) Last BM Date : 05/04/23 General: 53 year old male in no acute distress. Eyes: Scleral icterus present. Heart: Rate and rhythm, no murmurs. Pulm: Breath sounds clear throughout. Abdomen: Soft, nondistended.  Nontender.  Positive bowel sounds to all 4 quadrants. Extremities: No edema. Neurologic:  Alert and oriented x 4. Grossly normal neurologically. Psych:  Alert and cooperative. Normal mood and affect.  Intake/Output from previous day: 04/01 0701 - 04/02 0700 In: 979 [P.O.:390; I.V.:589] Out: -  Intake/Output this shift: No intake/output data recorded.  Lab Results: Recent Labs    05/03/23 0850 05/04/23 0536 05/05/23 0544  WBC 5.1 3.9* 4.1  HGB 15.1 12.8* 14.0  HCT 44.7 38.2* 42.1  PLT 337 284 319   BMET Recent Labs    05/03/23 0850 05/04/23 0536 05/05/23 0544  NA 134* 135 135  K 3.3* 3.1* 3.3*  CL 98 102 103  CO2 26 21* 22  GLUCOSE 157* 83 124*  BUN 10 9 7   CREATININE 0.78 0.52* 0.48*  CALCIUM 9.4 8.7* 9.0   LFT Recent Labs    05/05/23 0544  PROT 6.8  ALBUMIN 3.2*  AST 159*  ALT 183*  ALKPHOS 276*  BILITOT 8.5*  BILIDIR 5.5*  IBILI 3.0*   PT/INR No results for input(s): "LABPROT", "INR" in the last 72  hours. Hepatitis Panel No results for input(s): "HEPBSAG", "HCVAB", "HEPAIGM", "HEPBIGM" in the last 72 hours.  MR ABDOMEN MRCP W WO CONTAST Result Date: 05/03/2023 CLINICAL DATA:  Acute on chronic pancreatitis, intermittent vomiting and abdominal pain EXAM: MRI ABDOMEN WITHOUT AND WITH CONTRAST (INCLUDING MRCP) TECHNIQUE: Multiplanar multisequence MR imaging of the abdomen was performed both before and after the administration of intravenous contrast. Heavily T2-weighted images of the biliary and pancreatic ducts were obtained, and three-dimensional MRCP images were rendered by post processing. CONTRAST:  8mL GADAVIST GADOBUTROL 1 MMOL/ML IV SOLN COMPARISON:  CT abdomen pelvis, 05/03/2023, 02/18/2023 FINDINGS: Lower chest: No acute abnormality. Hepatobiliary: No solid liver abnormality is seen. No gallstones. Similar, mild gallbladder wall thickening. Severe intra and extrahepatic biliary ductal dilatation, which is somewhat increased compared to examination dated 02/18/2023 common bile duct measuring 1.3 cm, previously 0.8 cm (series 3, image 15). Common bile duct is again nearly effaced within the superior pancreatic head. Pancreas: Severe stigmata of chronic pancreatitis, including multiple pseudocysts within the pancreatic head not significantly changed compared to examination dated 02/18/2023, and diffuse atrophy atrophy of the distal pancreas. Severe ductal dilatation, duct measuring up to 1.1 cm and, the main pancreatic duct effaced within the pancreatic head (series 4, image 18). Diffuse inflammatory fat stranding and fluid about the pancreas, predominantly of the pancreatic head, and to a lesser extent the adjacent  retroperitoneum and small bowel mesentery. Spleen: Normal in size without significant abnormality. Adrenals/Urinary Tract: Adrenal glands are unremarkable. Kidneys are normal, without renal calculi, solid lesion, or hydronephrosis. Stomach/Bowel: Stomach is within normal limits. No evidence  of bowel wall thickening, distention, or inflammatory changes. Vascular/Lymphatic: Aortic atherosclerosis. No enlarged abdominal lymph nodes. Other: No abdominal wall hernia or abnormality. No ascites. Musculoskeletal: No acute or significant osseous findings. IMPRESSION: 1. Severe stigmata of chronic pancreatitis, including multiple pseudocysts within the pancreatic head, not significantly changed compared to examination dated 02/18/2023, and diffuse atrophy of the distal pancreas. Severe pancreatic ductal dilatation, duct measuring up to 1.1 cm and, the main pancreatic duct effaced within the pancreatic head. 2. Diffuse inflammatory fat stranding and fluid about the pancreas, predominantly of the pancreatic head, and to a lesser extent the adjacent retroperitoneum and small bowel mesentery. Findings are consistent with acute on chronic pancreatitis. 3. Severe intra and extrahepatic biliary ductal dilatation, which is somewhat increased compared to examination dated 02/18/2023. Common bile duct is again nearly effaced within the superior pancreatic head. No choledocholithiasis. 4. Similar, mild gallbladder wall thickening. Aortic Atherosclerosis (ICD10-I70.0). Electronically Signed   By: Jearld Lesch M.D.   On: 05/03/2023 21:13   MR 3D Recon At Scanner Result Date: 05/03/2023 CLINICAL DATA:  Acute on chronic pancreatitis, intermittent vomiting and abdominal pain EXAM: MRI ABDOMEN WITHOUT AND WITH CONTRAST (INCLUDING MRCP) TECHNIQUE: Multiplanar multisequence MR imaging of the abdomen was performed both before and after the administration of intravenous contrast. Heavily T2-weighted images of the biliary and pancreatic ducts were obtained, and three-dimensional MRCP images were rendered by post processing. CONTRAST:  8mL GADAVIST GADOBUTROL 1 MMOL/ML IV SOLN COMPARISON:  CT abdomen pelvis, 05/03/2023, 02/18/2023 FINDINGS: Lower chest: No acute abnormality. Hepatobiliary: No solid liver abnormality is seen. No  gallstones. Similar, mild gallbladder wall thickening. Severe intra and extrahepatic biliary ductal dilatation, which is somewhat increased compared to examination dated 02/18/2023 common bile duct measuring 1.3 cm, previously 0.8 cm (series 3, image 15). Common bile duct is again nearly effaced within the superior pancreatic head. Pancreas: Severe stigmata of chronic pancreatitis, including multiple pseudocysts within the pancreatic head not significantly changed compared to examination dated 02/18/2023, and diffuse atrophy atrophy of the distal pancreas. Severe ductal dilatation, duct measuring up to 1.1 cm and, the main pancreatic duct effaced within the pancreatic head (series 4, image 18). Diffuse inflammatory fat stranding and fluid about the pancreas, predominantly of the pancreatic head, and to a lesser extent the adjacent retroperitoneum and small bowel mesentery. Spleen: Normal in size without significant abnormality. Adrenals/Urinary Tract: Adrenal glands are unremarkable. Kidneys are normal, without renal calculi, solid lesion, or hydronephrosis. Stomach/Bowel: Stomach is within normal limits. No evidence of bowel wall thickening, distention, or inflammatory changes. Vascular/Lymphatic: Aortic atherosclerosis. No enlarged abdominal lymph nodes. Other: No abdominal wall hernia or abnormality. No ascites. Musculoskeletal: No acute or significant osseous findings. IMPRESSION: 1. Severe stigmata of chronic pancreatitis, including multiple pseudocysts within the pancreatic head, not significantly changed compared to examination dated 02/18/2023, and diffuse atrophy of the distal pancreas. Severe pancreatic ductal dilatation, duct measuring up to 1.1 cm and, the main pancreatic duct effaced within the pancreatic head. 2. Diffuse inflammatory fat stranding and fluid about the pancreas, predominantly of the pancreatic head, and to a lesser extent the adjacent retroperitoneum and small bowel mesentery. Findings  are consistent with acute on chronic pancreatitis. 3. Severe intra and extrahepatic biliary ductal dilatation, which is somewhat increased compared to examination dated 02/18/2023. Common bile  duct is again nearly effaced within the superior pancreatic head. No choledocholithiasis. 4. Similar, mild gallbladder wall thickening. Aortic Atherosclerosis (ICD10-I70.0). Electronically Signed   By: Jearld Lesch M.D.   On: 05/03/2023 21:13   CT ABDOMEN PELVIS W CONTRAST Result Date: 05/03/2023 CLINICAL DATA:  Abdominal pain. EXAM: CT ABDOMEN AND PELVIS WITH CONTRAST TECHNIQUE: Multidetector CT imaging of the abdomen and pelvis was performed using the standard protocol following bolus administration of intravenous contrast. RADIATION DOSE REDUCTION: This exam was performed according to the departmental dose-optimization program which includes automated exposure control, adjustment of the mA and/or kV according to patient size and/or use of iterative reconstruction technique. CONTRAST:  OMNIPAQUE IOHEXOL 300 MG/ML  SOLN COMPARISON:  CT abdomen pelvis dated 02/18/2023. FINDINGS: Lower chest: The visualized lung bases are clear. No intra-abdominal free air.  Small free fluid in the pelvis. Hepatobiliary: The liver is unremarkable. Mild biliary dilatation. The gallbladder is distended. No calcified gallstone. Pancreas: There is inflammatory changes of the pancreas. Enlargement of the proximal pancreas predominantly involving the head and uncinate process with dilatation of the main pancreatic duct. Coarse calcifications of the proximal pancreas sequela of chronic pancreatitis. Similar appearance of small cystic structures in the head and uncinate process of the pancreas, likely pseudocysts. Spleen: Normal in size without focal abnormality. Adrenals/Urinary Tract: The adrenal glands are unremarkable. The kidneys, visualized ureters, and urinary bladder appear unremarkable. Stomach/Bowel: There is no bowel obstruction or  active inflammation. The appendix is normal. Vascular/Lymphatic: Mild aortoiliac atherosclerotic disease. The IVC is unremarkable. No portal venous gas. There is compression of the main portal vein by the inflammatory changes of the pancreas. No adenopathy. Reproductive: The prostate and seminal vesicles are grossly remarkable. Other: Intramuscular lipoma of the left groin. Musculoskeletal: Total right hip arthroplasty. No acute osseous pathology. IMPRESSION: 1. Acute on chronic pancreatitis. Similar small pseudocyst in the uncinate process and head of the pancreas. 2. No bowel obstruction. Normal appendix. Electronically Signed   By: Elgie Collard M.D.   On: 05/03/2023 13:30   Patient Profile: Evan Sullivan is a 53 y.o. male with a past medical history of arthritis, hypertension, diabetes mellitus type 2, gout, GERD, H. 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 Admitted 05/03/2023 with LUQ pain x 1 week secondary to acute on chronic pancreatitis.   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 S/P EUS at [facility] 04/12/2023 showed sonographic changes consistent with severe chronic pancreatitis, one abnormal lymph node was visualized in the peripancreatic region. FNA  was negative for malignancy. He developed constant LUQ burning pain one week ago which progressively worsened including one episode of vomiting clear emesis on 3/31. Elevated LFTs. Lipase level 325. CTAP with contrast showed acute on chronic pancreatitis with similar small pseudocyst in the uncinate process and head of the pancreas. Abdominal MRI/MRCP 05/03/2023 showed severe stigmata of acute and chronic pancreatitis including multiple pseudocyst within the pancreatic head, diffuse atrophy of the distal pancreas with severe PD dilatation, severe intra/extrahepatic biliary ductal dilatation which has increased when compared to prior imaging and the CBD nearly effaced within the  superior pancreatic head without evidence of choledocholithiasis. LFTs uptrending. T. Bili 5.6 -> 8.1 -> 8.5. Direct bili 5.2. Alk phos 265 -> 206 ->276. AST 157 -> 94 -> 159. ALT 243 -> 157 -> 183. Lipase 435 -> 78 -> 93. Hyperbilirubinemia likely secondary to biliary obstruction secondary to pancreatic edema and will hopefully improve with supportive measures. No role  for ERCP at this juncture.  Hemodynamically stable. -Heart healthy diet -Appreciate RD recommendations  -IV fluids and pain management per the hospitalist -Hepatic panel in am -Creon 36K two capsule po tid with meals -Continue complete alcohol abstinence  -Await further recommendations per Dr. Marina Goodell  -Eventually follow-up with [facility] pancreatic/biliary service    History of colon polyps. Colonoscopy 07/2021 identified 1 tubular adenomatous and 1 hyperplastic polyp removed from the colon. -Next colon polyp surveillance colonoscopy due 07/2028   History of GERD. H. pylori gastritis per EGD 07/2021, treated with successful eradication. -Pantoprazole 40mg  every day   Hypokalemia. K+ 3.1 -> 3.3. -Management per the hospitalist           Principal Problem:   Acute pancreatitis after endoscopic retrograde cholangiopancreatography (ERCP) Active Problems:   Acute on chronic pancreatitis (HCC)   Upper abdominal pain   Cholestasis   Abnormal CT of the abdomen   Obstructive jaundice     LOS: 2 days   Evan Sullivan  05/05/2023, 11:03 AM  GI ATTENDING  Interval history and data reviewed.  Agree with interval progress note as outlined.  The patient is doing better.  Care remains supportive.  When tolerating reasonable diet and has reasonable control of his pain, he is okay for discharge.  His outpatient follow-up will be with [facility] GI PANCREAS/BILIARY physicians.  I believe he has an outpatient appointment scheduled.  Wilhemina Bonito. Eda Keys., M.D. [facility] Division of Gastroenterology

## 2023-05-05 NOTE — Assessment & Plan Note (Signed)
 Nutrition following, their input is appreciated Continue supplements daily Continue Creon supplements Continue B complex

## 2023-05-05 NOTE — Plan of Care (Signed)

## 2023-05-05 NOTE — Assessment & Plan Note (Signed)
See assessment and plan above

## 2023-05-05 NOTE — Assessment & Plan Note (Signed)
 Atenolol 100 mg daily Amlodipine 10 mg daily

## 2023-05-05 NOTE — Assessment & Plan Note (Addendum)
>>  ASSESSMENT AND PLAN FOR PANCREATITIS WRITTEN ON 06/06/2023 10:47 AM BY Laneta Pintos, MD   >>ASSESSMENT AND PLAN FOR ACUTE PANCREATITIS AFTER ENDOSCOPIC RETROGRADE CHOLANGIOPANCREATOGRAPHY (ERCP) WRITTEN ON 05/05/2023  9:18 PM BY True Fuss, MD  Recent ERCP at [facility] 3/10 MRCP here notable for acute on chronic pancreatitis with multiple pseudocysts pancreatic ductal dilation, severe intra/extrahepatic biliary duct dilatation and common bile duct nearly effaced. [place] gastro[facility]ology following, assistance is appreciated Persisting jaundice, scleral icterus, rising bilirubin Continue supportive care, serial LFTs Lipase downtrending Continuing Creon  per home regimen Will likely be discharged in the next 24 to 48 hours with outpatient follow-up with [facility] biliary service   >>ASSESSMENT AND PLAN FOR ACUTE ON CHRONIC PANCREATITIS (HCC) WRITTEN ON 05/05/2023  9:18 PM BY True Fuss, MD  See assessment and plan above

## 2023-05-06 ENCOUNTER — Other Ambulatory Visit (HOSPITAL_COMMUNITY): Payer: Self-pay

## 2023-05-06 ENCOUNTER — Telehealth: Payer: Self-pay | Admitting: Nurse Practitioner

## 2023-05-06 DIAGNOSIS — K859 Acute pancreatitis without necrosis or infection, unspecified: Secondary | ICD-10-CM | POA: Diagnosis not present

## 2023-05-06 DIAGNOSIS — K860 Alcohol-induced chronic pancreatitis: Secondary | ICD-10-CM | POA: Diagnosis not present

## 2023-05-06 DIAGNOSIS — F1090 Alcohol use, unspecified, uncomplicated: Secondary | ICD-10-CM | POA: Diagnosis not present

## 2023-05-06 DIAGNOSIS — K9189 Other postprocedural complications and disorders of digestive system: Secondary | ICD-10-CM | POA: Diagnosis not present

## 2023-05-06 DIAGNOSIS — R1012 Left upper quadrant pain: Secondary | ICD-10-CM | POA: Diagnosis not present

## 2023-05-06 DIAGNOSIS — R7989 Other specified abnormal findings of blood chemistry: Secondary | ICD-10-CM

## 2023-05-06 DIAGNOSIS — I10 Essential (primary) hypertension: Secondary | ICD-10-CM | POA: Diagnosis not present

## 2023-05-06 DIAGNOSIS — K8681 Exocrine pancreatic insufficiency: Secondary | ICD-10-CM | POA: Diagnosis not present

## 2023-05-06 DIAGNOSIS — E871 Hypo-osmolality and hyponatremia: Secondary | ICD-10-CM

## 2023-05-06 DIAGNOSIS — K219 Gastro-esophageal reflux disease without esophagitis: Secondary | ICD-10-CM | POA: Diagnosis not present

## 2023-05-06 LAB — RENAL FUNCTION PANEL
Albumin: 3.2 g/dL — ABNORMAL LOW (ref 3.5–5.0)
Anion gap: 12 (ref 5–15)
BUN: 8 mg/dL (ref 6–20)
CO2: 20 mmol/L — ABNORMAL LOW (ref 22–32)
Calcium: 9.2 mg/dL (ref 8.9–10.3)
Chloride: 100 mmol/L (ref 98–111)
Creatinine, Ser: 0.62 mg/dL (ref 0.61–1.24)
GFR, Estimated: >60 mL/min (ref >60)
Glucose, Bld: 151 mg/dL — ABNORMAL HIGH (ref 70–99)
Phosphorus: 3.2 mg/dL (ref 2.5–4.6)
Potassium: 3.6 mmol/L (ref 3.5–5.1)
Sodium: 132 mmol/L — ABNORMAL LOW (ref 135–145)

## 2023-05-06 LAB — HEPATIC FUNCTION PANEL
ALT: 346 U/L — ABNORMAL HIGH (ref 0–44)
AST: 440 U/L — ABNORMAL HIGH (ref 15–41)
Albumin: 3.1 g/dL — ABNORMAL LOW (ref 3.5–5.0)
Alkaline Phosphatase: 344 U/L — ABNORMAL HIGH (ref 38–126)
Bilirubin, Direct: 5.9 mg/dL — ABNORMAL HIGH (ref 0.0–0.2)
Indirect Bilirubin: 2.9 mg/dL — ABNORMAL HIGH (ref 0.3–0.9)
Total Bilirubin: 8.8 mg/dL — ABNORMAL HIGH (ref 0.0–1.2)
Total Protein: 6.7 g/dL (ref 6.5–8.1)

## 2023-05-06 LAB — MAGNESIUM: Magnesium: 1.8 mg/dL (ref 1.7–2.4)

## 2023-05-06 MED ORDER — LISINOPRIL 20 MG PO TABS
20.0000 mg | ORAL_TABLET | Freq: Every day | ORAL | Status: DC
  Administered 2023-05-06: 20 mg via ORAL
  Filled 2023-05-06: qty 1

## 2023-05-06 MED ORDER — LISINOPRIL 20 MG PO TABS
20.0000 mg | ORAL_TABLET | Freq: Every day | ORAL | 2 refills | Status: DC
  Filled 2023-05-06: qty 30, 30d supply, fill #0

## 2023-05-06 MED ORDER — OXYCODONE HCL 10 MG PO TABS
10.0000 mg | ORAL_TABLET | ORAL | 0 refills | Status: AC | PRN
  Filled 2023-05-06: qty 30, 5d supply, fill #0

## 2023-05-06 MED ORDER — ONDANSETRON HCL 4 MG PO TABS
4.0000 mg | ORAL_TABLET | Freq: Every day | ORAL | 0 refills | Status: DC | PRN
  Filled 2023-05-06: qty 30, 30d supply, fill #0

## 2023-05-06 NOTE — Assessment & Plan Note (Signed)
 Recent ERCP at [facility] 3/10 MRCP here notable for acute on chronic pancreatitis with multiple pseudocysts pancreatic ductal dilation, severe intra/extrahepatic biliary duct dilatation and common bile duct nearly effaced. [place] gastro[facility] following, assistance is appreciated Persisting jaundice, scleral icterus, rising bilirubin Continue supportive care, serial LFTs Lipase downtrending Continuing Creon per home regimen Will likely be discharged in the next 24 to 48 hours with outpatient follow-up with [facility] biliary service

## 2023-05-06 NOTE — Telephone Encounter (Signed)
 Lab order & reminder has been entered.

## 2023-05-06 NOTE — Assessment & Plan Note (Signed)
 Continue home regimen of Protonix 40 mg daily History of H pylori infection status post treatment 2023

## 2023-05-06 NOTE — Assessment & Plan Note (Signed)
 Severe intra and extrahepatic biliary duct dilatation and common bile duct nearly effaced Bilirubin rising, see notations above

## 2023-05-06 NOTE — Progress Notes (Signed)
 Discharge meds delivered to Damaris Schooner RN D [employer]

## 2023-05-06 NOTE — Assessment & Plan Note (Signed)
 Nutrition following, their input is appreciated Continue supplements daily Continue Creon supplements Continue B complex

## 2023-05-06 NOTE — Progress Notes (Addendum)
 [facility] Progress Note  CC:   Abdominal pain, chronic pancreatitis   Subjective: He had an episode of epigastric pain which became diffuse and spread throughout his abdomen after midnight, he rated this pain an 8 out of 10 without nausea or vomiting which subsided after he received Oxycodone 5mg .  He ate a boiled egg, [place] toast and [place] sausage for breakfast this morning which resulted in mild epigastric discomfort which was relieved after he received Dilaudid IV.  No chest pain or shortness of breath.  Last bowel movement was yesterday, no bloody or black stools.    Objective:  Vital signs in last 24 hours: Temp:  [98.3 F (36.8 C)-98.8 F (37.1 C)] 98.8 F (37.1 C) (04/03 0526) Pulse Rate:  [62-70] 62 (04/03 0526) Resp:  [18] 18 (04/03 0526) BP: (143-164)/(88-102) 164/88 (04/03 0526) SpO2:  [100 %] 100 % (04/03 0526) Last BM Date : 05/05/23 General: 53 year old male in no acute distress. Eyes: Scleral icterus present. Heart: Regular rate and rhythm, no murmurs. Pulm: Breath sounds clear throughout. Abdomen: Soft, nondistended.  Nontender at this time.  Positive bowel sounds all 4 quadrants. Extremities: No edema. Neurologic:  Alert and  oriented x 4. Grossly normal neurologically. Psych:  Alert and cooperative. Normal mood and affect.  Intake/Output from previous day: 04/02 0701 - 04/03 0700 In: 240 [P.O.:240] Out: -  Intake/Output this shift: No intake/output data recorded.  Lab Results: Recent Labs    05/04/23 0536 05/05/23 0544  WBC 3.9* 4.1  HGB 12.8* 14.0  HCT 38.2* 42.1  PLT 284 319   BMET Recent Labs    05/04/23 0536 05/05/23 0544 05/06/23 0533  NA 135 135 132*  K 3.1* 3.3* 3.6  CL 102 103 100  CO2 21* 22 20*  GLUCOSE 83 124* 151*  BUN 9 7 8   CREATININE 0.52* 0.48* 0.62  CALCIUM 8.7* 9.0 9.2   LFT Recent Labs    05/06/23 0532 05/06/23 0533  PROT 6.7  --   ALBUMIN 3.1* 3.2*  AST 440*  --   ALT 346*  --   ALKPHOS  344*  --   BILITOT 8.8*  --   BILIDIR 5.9*  --   IBILI 2.9*  --    PT/INR No results for input(s): "LABPROT", "INR" in the last 72 hours. Hepatitis Panel No results for input(s): "HEPBSAG", "HCVAB", "HEPAIGM", "HEPBIGM" in the last 72 hours.  No results found.  Patient Profile: Evan Sullivan is a 53 y.o. male with a past medical history of arthritis, hypertension, diabetes mellitus type 2, gout, GERD, H. 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 Admitted 05/03/2023 with LUQ pain x 1 week secondary to acute on chronic pancreatitis.   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 S/P EUS at [facility] 04/12/2023 showed sonographic changes consistent with severe chronic pancreatitis, one abnormal lymph node was visualized in the peripancreatic region. FNA  was negative for malignancy. He developed constant LUQ burning pain one week ago which progressively worsened including one episode of vomiting clear emesis on 3/31. Elevated LFTs. Lipase level 325. CTAP with contrast showed acute on chronic pancreatitis with similar small pseudocyst in the uncinate process and head of the pancreas. Abdominal MRI/MRCP 05/03/2023 showed severe stigmata of acute and chronic pancreatitis including multiple pseudocyst within the pancreatic head, diffuse atrophy of the distal pancreas with severe PD dilatation, severe intra/extrahepatic biliary ductal dilatation which has increased when compared to prior imaging  and the CBD nearly effaced within the superior pancreatic head without evidence of choledocholithiasis. LFTs uptrending. T. Bili 5.6 -> 8.1 -> 8.5 -> 8.8. Direct bili 5.2 -> 5.9. Alk phos 265 -> 206 ->276 -> 344. AST 157 -> 94 -> 159 -> 440. ALT 243 -> 157 -> 183 ->346. Lipase 435 -> 78 -> 93. Hyperbilirubinemia due to extrinsic biliary obstruction secondary to pancreatic edema and will hopefully improve with supportive measures. No role for ERCP at this  juncture.  Hemodynamically stable. -Heart healthy diet -Appreciate RD recommendations  -IV fluids and pain management per the hospitalist -Creon 36K two capsule po tid with meals -Continue complete alcohol abstinence  -Patient will likely be discharged home today, await discharge further recommendations per Dr. Marina Goodell  -Hepatic panel early next week, our office will contact patient to facilitate lab order -Follow-up with [facility] pancreatic/biliary service.  Patient has a video visit with Dr. Eustace Quail at 9:20 AM on Wed 06/30/2023.  I contacted [facility] GI clinic to expedite a follow-up appointment and there was no earlier availability.   History of colon polyps. Colonoscopy 07/2021 identified 1 tubular adenomatous and 1 hyperplastic polyp removed from the colon. -Next colon polyp surveillance colonoscopy due 07/2028   History of GERD. H. pylori gastritis per EGD 07/2021, treated with successful eradication. -Pantoprazole 40mg  every day   Hyponatremia. Na+ 132.    Principal Problem:   Acute pancreatitis after endoscopic retrograde cholangiopancreatography (ERCP) Active Problems:   Essential hypertension   GERD without esophagitis   Acute on chronic pancreatitis (HCC)   Marijuana abuse   Hypokalemia   Type 2 diabetes mellitus without complication, without long-term current use of insulin (HCC)   Cholestasis   Obstructive jaundice   Malnutrition of moderate degree   Mixed diabetic hyperlipidemia associated with type 2 diabetes mellitus (HCC)     LOS: 3 days   [name]  05/06/2023, 10:08 AM  GI ATTENDING  Interval history data reviewed.  Patient seen and examined.  Agree with several progress note.  As I noted previously, the patient is okay to go home if he is taking adequate oral nutrition and his pain is reasonably controlled.  His liver test should be trended as outpatient.  His outpatient GI here in [place]'s Dr. Tomasa Rand.  My nurse practitioner will arrange blood work.   He will follow-up with [facility] as noted.  Please contact us for questions or problems.  Thanks.  Wilhemina Bonito. Eda Keys., M.D. [facility] Division of Gastroenterology

## 2023-05-06 NOTE — Assessment & Plan Note (Signed)
 Admits to daily marijuana use Counseling on cessation

## 2023-05-06 NOTE — Plan of Care (Signed)

## 2023-05-06 NOTE — Assessment & Plan Note (Signed)
See assessment and plan above

## 2023-05-06 NOTE — Progress Notes (Incomplete)
 PROGRESS NOTE   KEYSTON Sullivan  ZOX:[id number] DOB: 05-08-1970 DOA: 05/03/2023 PCP: Sandre Kitty, MD   Date of Service: the patient was seen and examined on 05/06/2023  Brief Narrative:  53 y.o. male with past medical history significant for chronic pancreatitis secondary to alcohol abuse, daily marijuana abuse, HTN, DM2, gout, GERD, OA, H. pylori who presented with a long Hospital ED on 05/03/2023 with constant left upper quadrant pain over the last week after a recent ERCP at [facility].     Of note, follows with [place] [facility], Dr. Tomasa Rand with recent referral to [facility] in which she underwent EUS on 04/12/2023 showing sonographic changes consistent with severe chronic pancreatitis s/p biopsy with FNA negative for malignancy.  Recommended repeat abdominal MRCP 6/8 weeks.    Upon evaluation in the emergency department patient was found to have a lipase of 435.  CT imaging of the abdomen and pelvis revealed evidence of acute on chronic pancreatitis.  MRCP revealed severe stigmata of chronic pancreatitis including multiple pseudocyst in the pancreatic head and findings concerning for severe intra and extrahepatic biliary ductal dilation.  The hospitalist group was then called to assess the patient for admission the hospital.  Gastroenterology was consulted.  Patient was kept n.p.o. initially with intravenous fluids.  Upon gradual mild clinical improvement a clear liquid diet was initiated and advanced as tolerated.  Eventual plan for patient to follow-up with [facility] pancreatic/biliary service after discharge.   Assessment & Plan Acute pancreatitis after endoscopic retrograde cholangiopancreatography (ERCP) Recent ERCP at [facility] 3/10 MRCP here notable for acute on chronic pancreatitis with multiple pseudocysts pancreatic ductal dilation, severe intra/extrahepatic biliary duct dilatation and common bile duct nearly effaced. [place] [facility] following, assistance is  appreciated Persisting jaundice, scleral icterus, rising bilirubin Continue supportive care, serial LFTs Lipase downtrending Continuing Creon per home regimen Will likely be discharged in the next 24 to 48 hours with outpatient follow-up with [facility] biliary service Acute on chronic pancreatitis (HCC) See assessment and plan above Obstructive jaundice Severe intra and extrahepatic biliary duct dilatation and common bile duct nearly effaced Bilirubin rising, see notations above Malnutrition of moderate degree Nutrition following, their input is appreciated Continue supplements daily Continue Creon supplements Continue B complex Hypokalemia Recurrent Replacing with potassium chloride Monitor potassium levels with serial chemistries Essential hypertension Atenolol 100 mg daily Amlodipine 10 mg daily Type 2 diabetes mellitus without complication, without long-term current use of insulin (HCC) Patient been placed on Accu-Cheks before every meal and nightly with sliding scale insulin Hemoglobin A1C 6.8% Diabetic Diet  Mixed diabetic hyperlipidemia associated with type 2 diabetes mellitus (HCC) Continue fenofibrate 54 mg daily GERD without esophagitis Continue home regimen of Protonix 40 mg daily History of H pylori infection status post treatment 2023 Marijuana abuse Admits to daily marijuana use Counseling on cessation Cholestasis    Subjective:  Patient complains of persisting dark urine, yellow skin and yellow eyes.  Patient denies significant abdominal pain.  Appetite is tenuous  Physical Exam:  Vitals:   05/05/23 0917 05/05/23 1224 05/05/23 2014 05/06/23 0526  BP: (!) 165/100 (!) 157/102 (!) 143/93 (!) 164/88  Pulse:  70 65 62  Resp:  18 18 18   Temp:  98.6 F (37 C) 98.3 F (36.8 C) 98.8 F (37.1 C)  TempSrc:  Oral    SpO2:  100% 100% 100%  Weight:      Height:        Constitutional: Awake alert and oriented x3, no associated distress.  Skin: Notable  jaundice, poor skin turgor noted. Eyes: Pupils are equally reactive to light.  Scleral icterus noted. ENMT: Moist mucous membranes noted.  Posterior pharynx clear of any exudate or lesions.   Respiratory: clear to auscultation bilaterally, no wheezing, no crackles. Normal respiratory effort. No accessory muscle use.  Cardiovascular: Regular rate and rhythm, no murmurs / rubs / gallops. No extremity edema. 2+ pedal pulses. No carotid bruits.  Abdomen: Epigastric tenderness.  Abdomen is soft.  No evidence of intra-abdominal masses.  Positive bowel sounds noted in all quadrants.   Musculoskeletal: No joint deformity upper and lower extremities. Good ROM, no contractures. Normal muscle tone.    Data Reviewed:  I have personally reviewed and interpreted labs, imaging.  Significant findings are   CBC: Recent Labs  Lab 05/03/23 0850 05/04/23 0536 05/05/23 0544  WBC 5.1 3.9* 4.1  HGB 15.1 12.8* 14.0  HCT 44.7 38.2* 42.1  MCV 91.0 91.8 92.3  PLT 337 284 319   Basic Metabolic Panel: Recent Labs  Lab 05/03/23 0850 05/03/23 0905 05/04/23 0536 05/05/23 0544 05/06/23 0532 05/06/23 0533  NA 134*  --  135 135  --  132*  K 3.3*  --  3.1* 3.3*  --  3.6  CL 98  --  102 103  --  100  CO2 26  --  21* 22  --  20*  GLUCOSE 157*  --  83 124*  --  151*  BUN 10  --  9 7  --  8  CREATININE 0.78  --  0.52* 0.48*  --  0.62  CALCIUM 9.4  --  8.7* 9.0  --  9.2  MG  --  1.8  --  1.8 1.8  --   PHOS  --   --   --   --   --  3.2   GFR: Estimated Creatinine Clearance: 123.5 mL/min (by C-G formula based on SCr of 0.62 mg/dL). Liver Function Tests: Recent Labs  Lab 05/03/23 0850 05/04/23 0536 05/05/23 0544 05/06/23 0532 05/06/23 0533  AST 157* 94* 159* 440*  --   ALT 243* 157* 183* 346*  --   ALKPHOS 265* 206* 276* 344*  --   BILITOT 5.6* 8.1* 8.5* 8.8*  --   PROT 7.2 5.9* 6.8 6.7  --   ALBUMIN 3.5 2.9* 3.2* 3.1* 3.2*    Code Status:  Full code.  Code status decision has been confirmed  with: patient   Severity of Illness:  The appropriate patient status for this patient is INPATIENT. Inpatient status is judged to be reasonable and necessary in order to provide the required intensity of service to ensure the patient's safety. The patient's presenting symptoms, physical exam findings, and initial radiographic and laboratory data in the context of their chronic comorbidities is felt to place them at high risk for further clinical deterioration. Furthermore, it is not anticipated that the patient will be medically stable for discharge from the hospital within 2 midnights of admission.   * I certify that at the point of admission it is my clinical judgment that the patient will require inpatient hospital care spanning beyond 2 midnights from the point of admission due to high intensity of service, high risk for further deterioration and high frequency of surveillance required.*  Time spent:  45 minutes  Author:  Marinda Elk MD  05/06/2023 8:50 AM

## 2023-05-06 NOTE — Assessment & Plan Note (Signed)
 Continue fenofibrate 54 mg daily

## 2023-05-06 NOTE — Assessment & Plan Note (Signed)
 Recurrent Replacing with potassium chloride Monitor potassium levels with serial chemistries

## 2023-05-06 NOTE — Telephone Encounter (Signed)
 DD, patient was discharged from the hospital to home today. Pls contact patient and send him to our lab in one week to have a hepatic panel done, pls put the lab order under Dr. Milas Hock name.  DX: pancreatitis and elevated LFTs. THX.

## 2023-05-06 NOTE — Assessment & Plan Note (Signed)
 Patient been placed on Accu-Cheks before every meal and nightly with sliding scale insulin Hemoglobin A1C 6.8% Diabetic Diet

## 2023-05-06 NOTE — Assessment & Plan Note (Signed)
 Atenolol 100 mg daily Amlodipine 10 mg daily

## 2023-05-06 NOTE — Plan of Care (Signed)

## 2023-05-07 NOTE — Discharge Summary (Signed)
 Physician Discharge Summary   Patient: Evan Sullivan MRN: [id number] DOB: 02-Jan-1971  Admit date:     05/03/2023  Discharge date: 05/06/2023  Discharge Physician: Marinda Elk   PCP: Sandre Kitty, MD   Recommendations at discharge:   Please take all prescribed medications exactly as instructed including your Creon pancreatic supplements and pain medication. Please follow dietary guidelines as detailed below under "pancreatitis nutrition therapy."  Please increase your physical activity as tolerated. Please maintain all outpatient follow-up appointments including: 1.)  Follow-up with your primary care provider 2.)  follow-up with Dr. Eustace Quail with [facility] gastroenterology on 5/28 3.) [facility] should be arranging follow-up surveillance labs for your liver in 1 week.  They will contact you. Please return to the emergency department if you develop worsening abdominal pain, fevers in excess of 100.4 F, weakness or inability to tolerate oral intake.  Discharge Diagnoses: Principal Problem:   Acute pancreatitis after endoscopic retrograde cholangiopancreatography (ERCP) Active Problems:   Essential hypertension   GERD without esophagitis   Acute on chronic pancreatitis (HCC)   Marijuana abuse   Hypokalemia   Type 2 diabetes mellitus without complication, without long-term current use of insulin (HCC)   Cholestasis   Obstructive jaundice   Malnutrition of moderate degree   Mixed diabetic hyperlipidemia associated with type 2 diabetes mellitus (HCC)  Resolved Problems:   * No resolved hospital problems. *   Hospital Course: 53 y.o. male with past medical history significant for chronic pancreatitis secondary to alcohol abuse, daily marijuana abuse, HTN, DM2, gout, GERD, OA, H. pylori who presented with a long Hospital ED on 05/03/2023 with constant left upper quadrant pain over the last week after a recent ERCP at [facility].     Of note, follows with [facility],  Dr. Tomasa Rand with recent referral to [facility] in which she underwent EUS on 04/12/2023 showing sonographic changes consistent with severe chronic pancreatitis s/p biopsy with FNA negative for malignancy.  Recommended repeat abdominal MRCP 6/8 weeks.    Upon evaluation in the emergency department patient was found to have a lipase of 435.  CT imaging of the abdomen and pelvis revealed evidence of acute on chronic pancreatitis.  MRCP revealed severe stigmata of chronic pancreatitis including multiple pseudocyst in the pancreatic head and findings concerning for severe intra and extrahepatic biliary ductal dilation.  The hospitalist group was then called to assess the patient for admission the hospital.  Gastroenterology was consulted.  Patient was kept n.p.o. initially with intravenous fluids.  Patient was provided with opiate-based analgesics for substantial associated pain.  Daily LFTs were performed to trend transaminases and bilirubin levels.    In the days that followed patient exhibited gradual mild clinical improvement.  A clear liquid diet was initiated and advanced as tolerated.  Bilirubin levels did remain somewhat elevated throughout the hospitalization however patient clinically gradually improved.  In the setting of continued clinical improvement, patient was discharged home in improved and stable condition of 05/06/2023 with instructions to follow-up locally both with [facility] gastroenterology as well as an eventual plan for patient to follow-up with [facility] pancreatic/biliary service after discharge.    Pain control - [employer] Controlled Substance Reporting System database was reviewed. and patient was instructed, not to drive, operate heavy machinery, perform activities at heights, swimming or participation in water activities or provide baby-sitting services while on Pain, Sleep and Anxiety Medications; until their outpatient Physician has advised to do so again. Also recommended to  not to take  more than prescribed Pain, Sleep and Anxiety Medications.   Consultants: Dr. Marina Goodell with Gastroenterology Procedures performed: none  Disposition: Home Diet recommendation:  Discharge Diet Orders (From admission, onward)     Start     Ordered   05/06/23 0000  Diet - low sodium heart healthy        05/06/23 1146           Cardiac and Carb modified diet  DISCHARGE MEDICATION: Allergies as of 05/06/2023   No Known Allergies      Medication List     TAKE these medications    amLODipine 10 MG tablet Commonly known as: NORVASC TAKE 1 TABLET BY MOUTH EVERY DAY   atenolol 100 MG tablet Commonly known as: TENORMIN TAKE 1 TABLET BY MOUTH EVERY DAY   colchicine 0.6 MG tablet Take 1 capsule by mouth daily as needed (gout pain)   Creon 36000-114000 units Cpep capsule Generic drug: lipase/protease/amylase TAKE 2 CAPSULES (72,000 UNITS TOTAL) BY MOUTH WITH BREAKFAST, WITH LUNCH, AND WITH EVENING MEAL.   diphenhydramine-acetaminophen 25-500 MG Tabs tablet Commonly known as: TYLENOL PM Take 2 tablets by mouth at bedtime as needed (Sleep/Pain).   fenofibrate 48 MG tablet Commonly known as: TRICOR TAKE 1 TABLET BY MOUTH EVERY DAY   hydrocortisone cream 1 % Apply 1 application  topically daily as needed (rash).   lisinopril 20 MG tablet Commonly known as: ZESTRIL Take 1 tablet (20 mg total) by mouth daily.   ondansetron 4 MG tablet Commonly known as: Zofran Dissolve 1 tablet (4 mg total) by mouth daily as needed for nausea or vomiting.   Oxycodone HCl 10 MG Tabs Take 1 tablet (10 mg total) by mouth every 4 (four) hours as needed for up to 5 days for severe pain (pain score 7-10).   pantoprazole 40 MG tablet Commonly known as: PROTONIX TAKE 1 TABLET (40 MG TOTAL) BY MOUTH DAILY TAKE 30 MINUTES BEFORE BREAKFAST What changed:  when to take this reasons to take this additional instructions        Follow-up Information     Canakis, Kathryne Sharper, DO.  Go on 06/30/2023.   Specialty: Gastroenterology Why: 9:20PM Contact information: [address] [facility] [address] [place] [address] [phone]         [facility] Follow up.   Why: You will be contacted for follow up labs one week after discharge.        Sandre Kitty, MD. Schedule an appointment as soon as possible for a visit in 1 week(s).   Specialty: Family Medicine Contact information: [address] [place] [address] [phone]                 Discharge Exam: Ceasar Mons Weights   05/03/23 0848  Weight: 83 kg    Constitutional: Awake alert and oriented x3, no associated distress.   Respiratory: clear to auscultation bilaterally, no wheezing, no crackles. Normal respiratory effort. No accessory muscle use.  Cardiovascular: Regular rate and rhythm, no murmurs / rubs / gallops. No extremity edema. 2+ pedal pulses. No carotid bruits.  Abdomen: Abdomen is soft and nontender.  No evidence of intra-abdominal masses.  Positive bowel sounds noted in all quadrants.   Musculoskeletal: No joint deformity upper and lower extremities. Good ROM, no contractures. Normal muscle tone.     Condition at discharge: fair  The results of significant diagnostics from this hospitalization (including imaging, microbiology, ancillary and laboratory) are listed below for reference.   Imaging Studies: MR  ABDOMEN MRCP W WO CONTAST Result Date: 05/03/2023 CLINICAL DATA:  Acute on chronic pancreatitis, intermittent vomiting and abdominal pain EXAM: MRI ABDOMEN WITHOUT AND WITH CONTRAST (INCLUDING MRCP) TECHNIQUE: Multiplanar multisequence MR imaging of the abdomen was performed both before and after the administration of intravenous contrast. Heavily T2-weighted images of the biliary and pancreatic ducts were obtained, and three-dimensional MRCP images were rendered by post processing. CONTRAST:  8mL GADAVIST GADOBUTROL 1 MMOL/ML IV SOLN COMPARISON:  CT abdomen pelvis,  05/03/2023, 02/18/2023 FINDINGS: Lower chest: No acute abnormality. Hepatobiliary: No solid liver abnormality is seen. No gallstones. Similar, mild gallbladder wall thickening. Severe intra and extrahepatic biliary ductal dilatation, which is somewhat increased compared to examination dated 02/18/2023 common bile duct measuring 1.3 cm, previously 0.8 cm (series 3, image 15). Common bile duct is again nearly effaced within the superior pancreatic head. Pancreas: Severe stigmata of chronic pancreatitis, including multiple pseudocysts within the pancreatic head not significantly changed compared to examination dated 02/18/2023, and diffuse atrophy atrophy of the distal pancreas. Severe ductal dilatation, duct measuring up to 1.1 cm and, the main pancreatic duct effaced within the pancreatic head (series 4, image 18). Diffuse inflammatory fat stranding and fluid about the pancreas, predominantly of the pancreatic head, and to a lesser extent the adjacent retroperitoneum and small bowel mesentery. Spleen: Normal in size without significant abnormality. Adrenals/Urinary Tract: Adrenal glands are unremarkable. Kidneys are normal, without renal calculi, solid lesion, or hydronephrosis. Stomach/Bowel: Stomach is within normal limits. No evidence of bowel wall thickening, distention, or inflammatory changes. Vascular/Lymphatic: Aortic atherosclerosis. No enlarged abdominal lymph nodes. Other: No abdominal wall hernia or abnormality. No ascites. Musculoskeletal: No acute or significant osseous findings. IMPRESSION: 1. Severe stigmata of chronic pancreatitis, including multiple pseudocysts within the pancreatic head, not significantly changed compared to examination dated 02/18/2023, and diffuse atrophy of the distal pancreas. Severe pancreatic ductal dilatation, duct measuring up to 1.1 cm and, the main pancreatic duct effaced within the pancreatic head. 2. Diffuse inflammatory fat stranding and fluid about the pancreas,  predominantly of the pancreatic head, and to a lesser extent the adjacent retroperitoneum and small bowel mesentery. Findings are consistent with acute on chronic pancreatitis. 3. Severe intra and extrahepatic biliary ductal dilatation, which is somewhat increased compared to examination dated 02/18/2023. Common bile duct is again nearly effaced within the superior pancreatic head. No choledocholithiasis. 4. Similar, mild gallbladder wall thickening. Aortic Atherosclerosis (ICD10-I70.0). Electronically Signed   By: Jearld Lesch M.D.   On: 05/03/2023 21:13   MR 3D Recon At Scanner Result Date: 05/03/2023 CLINICAL DATA:  Acute on chronic pancreatitis, intermittent vomiting and abdominal pain EXAM: MRI ABDOMEN WITHOUT AND WITH CONTRAST (INCLUDING MRCP) TECHNIQUE: Multiplanar multisequence MR imaging of the abdomen was performed both before and after the administration of intravenous contrast. Heavily T2-weighted images of the biliary and pancreatic ducts were obtained, and three-dimensional MRCP images were rendered by post processing. CONTRAST:  8mL GADAVIST GADOBUTROL 1 MMOL/ML IV SOLN COMPARISON:  CT abdomen pelvis, 05/03/2023, 02/18/2023 FINDINGS: Lower chest: No acute abnormality. Hepatobiliary: No solid liver abnormality is seen. No gallstones. Similar, mild gallbladder wall thickening. Severe intra and extrahepatic biliary ductal dilatation, which is somewhat increased compared to examination dated 02/18/2023 common bile duct measuring 1.3 cm, previously 0.8 cm (series 3, image 15). Common bile duct is again nearly effaced within the superior pancreatic head. Pancreas: Severe stigmata of chronic pancreatitis, including multiple pseudocysts within the pancreatic head not significantly changed compared to examination dated 02/18/2023, and diffuse atrophy atrophy of the  distal pancreas. Severe ductal dilatation, duct measuring up to 1.1 cm and, the main pancreatic duct effaced within the pancreatic head (series  4, image 18). Diffuse inflammatory fat stranding and fluid about the pancreas, predominantly of the pancreatic head, and to a lesser extent the adjacent retroperitoneum and small bowel mesentery. Spleen: Normal in size without significant abnormality. Adrenals/Urinary Tract: Adrenal glands are unremarkable. Kidneys are normal, without renal calculi, solid lesion, or hydronephrosis. Stomach/Bowel: Stomach is within normal limits. No evidence of bowel wall thickening, distention, or inflammatory changes. Vascular/Lymphatic: Aortic atherosclerosis. No enlarged abdominal lymph nodes. Other: No abdominal wall hernia or abnormality. No ascites. Musculoskeletal: No acute or significant osseous findings. IMPRESSION: 1. Severe stigmata of chronic pancreatitis, including multiple pseudocysts within the pancreatic head, not significantly changed compared to examination dated 02/18/2023, and diffuse atrophy of the distal pancreas. Severe pancreatic ductal dilatation, duct measuring up to 1.1 cm and, the main pancreatic duct effaced within the pancreatic head. 2. Diffuse inflammatory fat stranding and fluid about the pancreas, predominantly of the pancreatic head, and to a lesser extent the adjacent retroperitoneum and small bowel mesentery. Findings are consistent with acute on chronic pancreatitis. 3. Severe intra and extrahepatic biliary ductal dilatation, which is somewhat increased compared to examination dated 02/18/2023. Common bile duct is again nearly effaced within the superior pancreatic head. No choledocholithiasis. 4. Similar, mild gallbladder wall thickening. Aortic Atherosclerosis (ICD10-I70.0). Electronically Signed   By: Jearld Lesch M.D.   On: 05/03/2023 21:13   CT ABDOMEN PELVIS W CONTRAST Result Date: 05/03/2023 CLINICAL DATA:  Abdominal pain. EXAM: CT ABDOMEN AND PELVIS WITH CONTRAST TECHNIQUE: Multidetector CT imaging of the abdomen and pelvis was performed using the standard protocol following bolus  administration of intravenous contrast. RADIATION DOSE REDUCTION: This exam was performed according to the departmental dose-optimization program which includes automated exposure control, adjustment of the mA and/or kV according to patient size and/or use of iterative reconstruction technique. CONTRAST:  OMNIPAQUE IOHEXOL 300 MG/ML  SOLN COMPARISON:  CT abdomen pelvis dated 02/18/2023. FINDINGS: Lower chest: The visualized lung bases are clear. No intra-abdominal free air.  Small free fluid in the pelvis. Hepatobiliary: The liver is unremarkable. Mild biliary dilatation. The gallbladder is distended. No calcified gallstone. Pancreas: There is inflammatory changes of the pancreas. Enlargement of the proximal pancreas predominantly involving the head and uncinate process with dilatation of the main pancreatic duct. Coarse calcifications of the proximal pancreas sequela of chronic pancreatitis. Similar appearance of small cystic structures in the head and uncinate process of the pancreas, likely pseudocysts. Spleen: Normal in size without focal abnormality. Adrenals/Urinary Tract: The adrenal glands are unremarkable. The kidneys, visualized ureters, and urinary bladder appear unremarkable. Stomach/Bowel: There is no bowel obstruction or active inflammation. The appendix is normal. Vascular/Lymphatic: Mild aortoiliac atherosclerotic disease. The IVC is unremarkable. No portal venous gas. There is compression of the main portal vein by the inflammatory changes of the pancreas. No adenopathy. Reproductive: The prostate and seminal vesicles are grossly remarkable. Other: Intramuscular lipoma of the left groin. Musculoskeletal: Total right hip arthroplasty. No acute osseous pathology. IMPRESSION: 1. Acute on chronic pancreatitis. Similar small pseudocyst in the uncinate process and head of the pancreas. 2. No bowel obstruction. Normal appendix. Electronically Signed   By: Elgie Collard M.D.   On: 05/03/2023 13:30     Microbiology: Results for orders placed or performed in visit on 07/09/22  Urine Culture     Status: Abnormal   Collection Time: 07/09/22 12:00 AM   Specimen: Urine  Urine  Result Value Ref Range Status   Urine Culture, Routine Final report (A)  Final   Organism ID, Bacteria Escherichia coli (A)  Final    Comment: Cefazolin with an MIC <=16 predicts susceptibility to the oral agents cefaclor, cefdinir, cefpodoxime, cefprozil, cefuroxime, cephalexin, and loracarbef when used for therapy of uncomplicated urinary tract infections due to E. coli, Klebsiella pneumoniae, and Proteus mirabilis. 50,000-100,000 colony forming units per mL    Antimicrobial Susceptibility Comment  Final    Comment:       ** S = Susceptible; I = Intermediate; R = Resistant **                    P = Positive; N = Negative             MICS are expressed in micrograms per mL    Antibiotic                 RSLT#1    RSLT#2    RSLT#3    RSLT#4 Amoxicillin/Clavulanic Acid    R Ampicillin                     R Cefazolin                      S Cefepime                       S Ceftriaxone                    S Cefuroxime                     I Ciprofloxacin                  S Ertapenem                      S Gentamicin                     S Imipenem                       S Levofloxacin                   S Meropenem                      S Nitrofurantoin                 S Piperacillin/Tazobactam        S Tetracycline                   R Tobramycin                     S Trimethoprim/Sulfa             S     Labs: CBC: Recent Labs  Lab 05/03/23 0850 05/04/23 0536 05/05/23 0544  WBC 5.1 3.9* 4.1  HGB 15.1 12.8* 14.0  HCT 44.7 38.2* 42.1  MCV 91.0 91.8 92.3  PLT 337 284 319   Basic Metabolic Panel: Recent Labs  Lab 05/03/23 0850 05/03/23 0905 05/04/23 0536 05/05/23 0544 05/06/23 0532 05/06/23 0533  NA 134*  --  135 135  --  132*  K 3.3*  --  3.1* 3.3*  --  3.6  CL  98  --  102 103  --  100   CO2 26  --  21* 22  --  20*  GLUCOSE 157*  --  83 124*  --  151*  BUN 10  --  9 7  --  8  CREATININE 0.78  --  0.52* 0.48*  --  0.62  CALCIUM 9.4  --  8.7* 9.0  --  9.2  MG  --  1.8  --  1.8 1.8  --   PHOS  --   --   --   --   --  3.2   Liver Function Tests: Recent Labs  Lab 05/03/23 0850 05/04/23 0536 05/05/23 0544 05/06/23 0532 05/06/23 0533  AST 157* 94* 159* 440*  --   ALT 243* 157* 183* 346*  --   ALKPHOS 265* 206* 276* 344*  --   BILITOT 5.6* 8.1* 8.5* 8.8*  --   PROT 7.2 5.9* 6.8 6.7  --   ALBUMIN 3.5 2.9* 3.2* 3.1* 3.2*   CBG: No results for input(s): "GLUCAP" in the last 168 hours.  Discharge time spent: greater than 30 minutes.  Signed: Marinda Elk, MD [facility] 05/07/2023

## 2023-05-10 ENCOUNTER — Telehealth: Payer: Self-pay | Admitting: Gastroenterology

## 2023-05-10 ENCOUNTER — Telehealth: Payer: Self-pay

## 2023-05-10 ENCOUNTER — Telehealth: Payer: Self-pay | Admitting: *Deleted

## 2023-05-10 NOTE — Telephone Encounter (Signed)
 Inbound call from [employer] regarding a claim the patient submitted for short term disability. States they have faxed over a form to be filled out of 4/3. Requesting additional information. Call back number is [phone]. Claim number is [id number]. Please advise, thank you.

## 2023-05-10 NOTE — Telephone Encounter (Signed)
 Patient called about work note. Advised waiting on provider for note.   Pt requesting call once note is ready so he may pick up.

## 2023-05-10 NOTE — Telephone Encounter (Signed)
 Dr Tomasa Rand-  Have you seen any disability papers on your desk for this patient?

## 2023-05-10 NOTE — Telephone Encounter (Signed)
 Hospital follow up has been scheduled. And a message has been sent to Dr. Constance Goltz already about the note.

## 2023-05-10 NOTE — Telephone Encounter (Signed)
 Called patient and scheduled a HOS FU for 05/17/23. Pt was in the [facility] for Acute Pancreatitis. Pt states that he is still have stomach issues. Pt reports that  he hasn't had a BM since Thursday and thinks that is part of the problem.   Pt is requesting a return to work note as soon as possible for him to return after he see you on 05/17/23.

## 2023-05-10 NOTE — Telephone Encounter (Signed)
 A note has been created and sent to him through mychart.

## 2023-05-10 NOTE — Telephone Encounter (Signed)
 Copied from CRM [id number]. Topic: Appointments - Scheduling Inquiry for Clinic >> May 10, 2023  8:15 AM [name] wrote: Reason for CRM: Patient was discharged from [facility] on 4/3, and agent could not populate hospital follow-up dates with provider within 2 weeks from that day, callback number for patient is [phone] to schedule. Patient is also asking for a work note for this week, as he is supposed to return to work tomorrow, but can't. >> May 10, 2023  2:22 PM Clayton Bibles wrote: Evan Sullivan is calling back to check on the note to be off work for the rest of this week. He needs to turn it in to him work by 5:00 today. He will come to Dr. Constance Goltz to pick up the note. Please call him at [phone]

## 2023-05-11 NOTE — Telephone Encounter (Signed)
 Forms faxed and received confirmation

## 2023-05-11 NOTE — Telephone Encounter (Signed)
 Informed pt of below.

## 2023-05-12 ENCOUNTER — Telehealth: Payer: Self-pay

## 2023-05-12 ENCOUNTER — Other Ambulatory Visit: Payer: Self-pay

## 2023-05-12 ENCOUNTER — Other Ambulatory Visit (INDEPENDENT_AMBULATORY_CARE_PROVIDER_SITE_OTHER)

## 2023-05-12 DIAGNOSIS — R7989 Other specified abnormal findings of blood chemistry: Secondary | ICD-10-CM

## 2023-05-12 DIAGNOSIS — K859 Acute pancreatitis without necrosis or infection, unspecified: Secondary | ICD-10-CM

## 2023-05-12 LAB — HEPATIC FUNCTION PANEL
ALT: 134 U/L — ABNORMAL HIGH (ref 0–53)
AST: 51 U/L — ABNORMAL HIGH (ref 0–37)
Albumin: 3.7 g/dL (ref 3.5–5.2)
Alkaline Phosphatase: 380 U/L — ABNORMAL HIGH (ref 39–117)
Bilirubin, Direct: 7.6 mg/dL — ABNORMAL HIGH (ref 0.0–0.3)
Total Bilirubin: 12.7 mg/dL — ABNORMAL HIGH (ref 0.2–1.2)
Total Protein: 6.6 g/dL (ref 6.0–8.3)

## 2023-05-12 NOTE — Telephone Encounter (Signed)
 Contacted patient to remind him to come in for repeat labs this week. Patient stated that he was having abdominal pain and had not had a bowel movement since Wednesday. Patient stated that he was passing gas and had a small amount of stool. Patient stated that he has been taking Miralax one capful daily. Spoke with a nurse. She stated to have patient do a bowel purge.

## 2023-05-16 ENCOUNTER — Observation Stay (HOSPITAL_COMMUNITY)

## 2023-05-16 ENCOUNTER — Emergency Department (HOSPITAL_COMMUNITY)

## 2023-05-16 ENCOUNTER — Encounter (HOSPITAL_COMMUNITY): Payer: Self-pay

## 2023-05-16 ENCOUNTER — Other Ambulatory Visit: Payer: Self-pay

## 2023-05-16 ENCOUNTER — Inpatient Hospital Stay (HOSPITAL_COMMUNITY)
Admission: EM | Admit: 2023-05-16 | Discharge: 2023-05-20 | DRG: 438 | Disposition: A | Discharge status: Home / Self Care | Attending: Internal Medicine | Admitting: Internal Medicine

## 2023-05-16 DIAGNOSIS — Z471 Aftercare following joint replacement surgery: Secondary | ICD-10-CM | POA: Diagnosis not present

## 2023-05-16 DIAGNOSIS — Z6824 Body mass index (BMI) 24.0-24.9, adult: Secondary | ICD-10-CM | POA: Diagnosis not present

## 2023-05-16 DIAGNOSIS — Z8719 Personal history of other diseases of the digestive system: Secondary | ICD-10-CM

## 2023-05-16 DIAGNOSIS — E871 Hypo-osmolality and hyponatremia: Secondary | ICD-10-CM | POA: Diagnosis present

## 2023-05-16 DIAGNOSIS — K8689 Other specified diseases of pancreas: Secondary | ICD-10-CM | POA: Diagnosis not present

## 2023-05-16 DIAGNOSIS — R1110 Vomiting, unspecified: Secondary | ICD-10-CM | POA: Diagnosis not present

## 2023-05-16 DIAGNOSIS — R1114 Bilious vomiting: Secondary | ICD-10-CM

## 2023-05-16 DIAGNOSIS — Z8711 Personal history of peptic ulcer disease: Secondary | ICD-10-CM

## 2023-05-16 DIAGNOSIS — Z8249 Family history of ischemic heart disease and other diseases of the circulatory system: Secondary | ICD-10-CM

## 2023-05-16 DIAGNOSIS — Z800 Family history of malignant neoplasm of digestive organs: Secondary | ICD-10-CM | POA: Diagnosis not present

## 2023-05-16 DIAGNOSIS — Z96641 Presence of right artificial hip joint: Secondary | ICD-10-CM | POA: Diagnosis not present

## 2023-05-16 DIAGNOSIS — F1011 Alcohol abuse, in remission: Secondary | ICD-10-CM | POA: Diagnosis present

## 2023-05-16 DIAGNOSIS — K2980 Duodenitis without bleeding: Secondary | ICD-10-CM

## 2023-05-16 DIAGNOSIS — D649 Anemia, unspecified: Secondary | ICD-10-CM | POA: Diagnosis not present

## 2023-05-16 DIAGNOSIS — Z860101 Personal history of adenomatous and serrated colon polyps: Secondary | ICD-10-CM | POA: Diagnosis not present

## 2023-05-16 DIAGNOSIS — E1169 Type 2 diabetes mellitus with other specified complication: Secondary | ICD-10-CM | POA: Diagnosis present

## 2023-05-16 DIAGNOSIS — R1084 Generalized abdominal pain: Secondary | ICD-10-CM

## 2023-05-16 DIAGNOSIS — K859 Acute pancreatitis without necrosis or infection, unspecified: Secondary | ICD-10-CM | POA: Diagnosis not present

## 2023-05-16 DIAGNOSIS — F1010 Alcohol abuse, uncomplicated: Secondary | ICD-10-CM | POA: Diagnosis not present

## 2023-05-16 DIAGNOSIS — I10 Essential (primary) hypertension: Secondary | ICD-10-CM | POA: Diagnosis not present

## 2023-05-16 DIAGNOSIS — R042 Hemoptysis: Secondary | ICD-10-CM | POA: Diagnosis not present

## 2023-05-16 DIAGNOSIS — R112 Nausea with vomiting, unspecified: Secondary | ICD-10-CM | POA: Diagnosis not present

## 2023-05-16 DIAGNOSIS — K219 Gastro-esophageal reflux disease without esophagitis: Secondary | ICD-10-CM | POA: Diagnosis not present

## 2023-05-16 DIAGNOSIS — K852 Alcohol induced acute pancreatitis without necrosis or infection: Secondary | ICD-10-CM | POA: Diagnosis not present

## 2023-05-16 DIAGNOSIS — K8500 Idiopathic acute pancreatitis without necrosis or infection: Secondary | ICD-10-CM | POA: Diagnosis not present

## 2023-05-16 DIAGNOSIS — R7989 Other specified abnormal findings of blood chemistry: Secondary | ICD-10-CM

## 2023-05-16 DIAGNOSIS — K831 Obstruction of bile duct: Secondary | ICD-10-CM | POA: Diagnosis not present

## 2023-05-16 DIAGNOSIS — R109 Unspecified abdominal pain: Secondary | ICD-10-CM | POA: Diagnosis not present

## 2023-05-16 DIAGNOSIS — Z79899 Other long term (current) drug therapy: Secondary | ICD-10-CM

## 2023-05-16 DIAGNOSIS — K861 Other chronic pancreatitis: Secondary | ICD-10-CM | POA: Diagnosis not present

## 2023-05-16 DIAGNOSIS — M109 Gout, unspecified: Secondary | ICD-10-CM | POA: Diagnosis not present

## 2023-05-16 DIAGNOSIS — E876 Hypokalemia: Secondary | ICD-10-CM | POA: Diagnosis present

## 2023-05-16 DIAGNOSIS — E441 Mild protein-calorie malnutrition: Secondary | ICD-10-CM | POA: Diagnosis not present

## 2023-05-16 DIAGNOSIS — K5900 Constipation, unspecified: Secondary | ICD-10-CM | POA: Diagnosis not present

## 2023-05-16 DIAGNOSIS — K828 Other specified diseases of gallbladder: Secondary | ICD-10-CM | POA: Diagnosis not present

## 2023-05-16 DIAGNOSIS — Z833 Family history of diabetes mellitus: Secondary | ICD-10-CM | POA: Diagnosis not present

## 2023-05-16 DIAGNOSIS — E782 Mixed hyperlipidemia: Secondary | ICD-10-CM | POA: Diagnosis not present

## 2023-05-16 DIAGNOSIS — E639 Nutritional deficiency, unspecified: Secondary | ICD-10-CM

## 2023-05-16 DIAGNOSIS — R140 Abdominal distension (gaseous): Secondary | ICD-10-CM | POA: Diagnosis not present

## 2023-05-16 DIAGNOSIS — K8591 Acute pancreatitis with uninfected necrosis, unspecified: Secondary | ICD-10-CM | POA: Diagnosis not present

## 2023-05-16 DIAGNOSIS — K838 Other specified diseases of biliary tract: Secondary | ICD-10-CM | POA: Diagnosis not present

## 2023-05-16 DIAGNOSIS — R1012 Left upper quadrant pain: Secondary | ICD-10-CM | POA: Diagnosis not present

## 2023-05-16 LAB — CBC WITH DIFFERENTIAL/PLATELET
Abs Immature Granulocytes: 0.02 10*3/uL (ref 0.00–0.07)
Basophils Absolute: 0 10*3/uL (ref 0.0–0.1)
Basophils Relative: 1 %
Eosinophils Absolute: 0.3 10*3/uL (ref 0.0–0.5)
Eosinophils Relative: 6 %
HCT: 36.3 % — ABNORMAL LOW (ref 39.0–52.0)
Hemoglobin: 12.7 g/dL — ABNORMAL LOW (ref 13.0–17.0)
Immature Granulocytes: 0 %
Lymphocytes Relative: 22 %
Lymphs Abs: 1.1 10*3/uL (ref 0.7–4.0)
MCH: 31.6 pg (ref 26.0–34.0)
MCHC: 35 g/dL (ref 30.0–36.0)
MCV: 90.3 fL (ref 80.0–100.0)
Monocytes Absolute: 0.5 10*3/uL (ref 0.1–1.0)
Monocytes Relative: 9 %
Neutro Abs: 3.2 10*3/uL (ref 1.7–7.7)
Neutrophils Relative %: 62 %
Platelets: 415 10*3/uL — ABNORMAL HIGH (ref 150–400)
RBC: 4.02 MIL/uL — ABNORMAL LOW (ref 4.22–5.81)
RDW: 16.3 % — ABNORMAL HIGH (ref 11.5–15.5)
WBC: 5.1 10*3/uL (ref 4.0–10.5)
nRBC: 0 % (ref 0.0–0.2)

## 2023-05-16 LAB — URINALYSIS, ROUTINE W REFLEX MICROSCOPIC
Bacteria, UA: NONE SEEN
Glucose, UA: NEGATIVE mg/dL
Hgb urine dipstick: NEGATIVE
Ketones, ur: NEGATIVE mg/dL
Leukocytes,Ua: NEGATIVE
Nitrite: NEGATIVE
Protein, ur: >=300 mg/dL — AB
Specific Gravity, Urine: 1.023 (ref 1.005–1.030)
pH: 5 (ref 5.0–8.0)

## 2023-05-16 LAB — COMPREHENSIVE METABOLIC PANEL WITH GFR
ALT: 277 U/L — ABNORMAL HIGH (ref 0–44)
AST: 235 U/L — ABNORMAL HIGH (ref 15–41)
Albumin: 3.1 g/dL — ABNORMAL LOW (ref 3.5–5.0)
Alkaline Phosphatase: 483 U/L — ABNORMAL HIGH (ref 38–126)
Anion gap: 9 (ref 5–15)
BUN: 6 mg/dL (ref 6–20)
CO2: 23 mmol/L (ref 22–32)
Calcium: 9.1 mg/dL (ref 8.9–10.3)
Chloride: 101 mmol/L (ref 98–111)
Creatinine, Ser: 0.41 mg/dL — ABNORMAL LOW (ref 0.61–1.24)
GFR, Estimated: >60 mL/min (ref >60)
Glucose, Bld: 126 mg/dL — ABNORMAL HIGH (ref 70–99)
Potassium: 3.5 mmol/L (ref 3.5–5.1)
Sodium: 133 mmol/L — ABNORMAL LOW (ref 135–145)
Total Bilirubin: 13 mg/dL — ABNORMAL HIGH (ref 0.0–1.2)
Total Protein: 7.3 g/dL (ref 6.5–8.1)

## 2023-05-16 LAB — PROTIME-INR
INR: 1.1 (ref 0.8–1.2)
Prothrombin Time: 14.4 s (ref 11.4–15.2)

## 2023-05-16 LAB — LIPASE, BLOOD: Lipase: 207 U/L — ABNORMAL HIGH (ref 11–51)

## 2023-05-16 MED ORDER — MORPHINE SULFATE (PF) 4 MG/ML IV SOLN
4.0000 mg | Freq: Once | INTRAVENOUS | Status: AC
  Administered 2023-05-16: 4 mg via INTRAVENOUS
  Filled 2023-05-16: qty 1

## 2023-05-16 MED ORDER — ONDANSETRON HCL 4 MG/2ML IJ SOLN
4.0000 mg | Freq: Once | INTRAMUSCULAR | Status: AC
  Administered 2023-05-16: 4 mg via INTRAVENOUS
  Filled 2023-05-16: qty 2

## 2023-05-16 MED ORDER — GADOBUTROL 1 MMOL/ML IV SOLN
8.0000 mL | Freq: Once | INTRAVENOUS | Status: AC | PRN
Start: 2023-05-16 — End: 2023-05-16
  Administered 2023-05-16: 8 mL via INTRAVENOUS

## 2023-05-16 MED ORDER — IOHEXOL 300 MG/ML  SOLN
100.0000 mL | Freq: Once | INTRAMUSCULAR | Status: AC | PRN
  Administered 2023-05-16: 100 mL via INTRAVENOUS

## 2023-05-16 MED ORDER — IOHEXOL 350 MG/ML SOLN
75.0000 mL | Freq: Once | INTRAVENOUS | Status: AC | PRN
  Administered 2023-05-16: 75 mL via INTRAVENOUS

## 2023-05-16 NOTE — ED Provider Notes (Addendum)
 [place] [facility]MENT AT [facility] Provider Note   CSN: [id number] Arrival date & time: 05/16/23  1313     History  Chief Complaint  Patient presents with   Abdominal Pain    Evan Sullivan is a 53 y.o. male.  Patient is a 53 year old male who presents with upper abdominal pain.  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  He was recently admitted from March 31 to April 3 for pancreatitis.  He presents today with pain in his upper abdomen.  He has some associated nausea and vomiting.  Similar to his prior episodes of pancreatitis.  No known fevers.  He did say he had an episode this morning where he coughed up some dark blood.  He only had the 1 episode.  He denies any shortness of breath.  No persistent coughing.  No nosebleeds.       Home Medications Prior to Admission medications   Medication Sig Start Date End Date Taking? Authorizing Provider  amLODipine (NORVASC) 10 MG tablet TAKE 1 TABLET BY MOUTH EVERY DAY 04/19/23   Laneta Pintos, MD  atenolol (TENORMIN) 100 MG tablet TAKE 1 TABLET BY MOUTH EVERY DAY 04/19/23   Olson, Daniel K, MD  colchicine 0.6 MG tablet Take 1 capsule by mouth daily as needed (gout pain) 06/26/20   McDonough, Lauren K, PA-C  CREON 36000-114000 units CPEP capsule TAKE 2 CAPSULES (72,000 UNITS TOTAL) BY MOUTH WITH BREAKFAST, WITH LUNCH, AND WITH EVENING MEAL. 04/14/23   Elois Hair, MD  diphenhydramine-acetaminophen (TYLENOL PM) 25-500 MG TABS tablet Take 2 tablets by mouth at bedtime as needed (Sleep/Pain).    [provider]  fenofibrate (TRICOR) 48 MG tablet TAKE 1 TABLET BY MOUTH EVERY DAY 03/25/23   Olson, Daniel K, MD  hydrocortisone cream 1 % Apply 1 application  topically daily as needed (rash).    [provider]  lisinopril (ZESTRIL) 20 MG tablet Take 1 tablet (20 mg total) by mouth daily. 05/06/23   Shalhoub, Merrill Abide, MD  ondansetron (ZOFRAN) 4 MG tablet  Dissolve 1 tablet (4 mg total) by mouth daily as needed for nausea or vomiting. 05/06/23 05/05/24  Shalhoub, Merrill Abide, MD  pantoprazole (PROTONIX) 40 MG tablet TAKE 1 TABLET (40 MG TOTAL) BY MOUTH DAILY TAKE 30 MINUTES BEFORE BREAKFAST Patient taking differently: Take 40 mg by mouth daily as needed (Indisgestion). 04/15/23   Kennedy-Smith, Colleen M, NP      Allergies    Patient has no known allergies.    Review of Systems   Review of Systems  Constitutional:  Negative for chills, diaphoresis, fatigue and fever.  HENT:  Negative for congestion, rhinorrhea and sneezing.   Eyes: Negative.   Respiratory:  Negative for cough, chest tightness and shortness of breath.        1 episode of hemoptysis  Cardiovascular:  Negative for chest pain and leg swelling.  Gastrointestinal:  Positive for abdominal pain, nausea and vomiting. Negative for blood in stool and diarrhea.  Genitourinary:  Negative for difficulty urinating, flank pain, frequency and hematuria.  Musculoskeletal:  Negative for arthralgias and back pain.  Skin:  Negative for rash.  Neurological:  Negative for dizziness, speech difficulty, weakness, numbness and headaches.    Physical Exam Updated Vital Signs BP (!) 159/97 (BP Location: Left Arm)   Pulse 61   Temp 98.4 F (36.9 C) (Oral)   Resp 16   Ht 6\' 1"  (1.854 m)  Wt 83 kg   SpO2 100%   BMI 24.14 kg/m  Physical Exam Constitutional:      Appearance: He is well-developed.  HENT:     Head: Normocephalic and atraumatic.  Eyes:     Pupils: Pupils are equal, round, and reactive to light.  Cardiovascular:     Rate and Rhythm: Normal rate and regular rhythm.     Heart sounds: Normal heart sounds.  Pulmonary:     Effort: Pulmonary effort is normal. No respiratory distress.     Breath sounds: Normal breath sounds. No wheezing or rales.  Chest:     Chest wall: No tenderness.  Abdominal:     General: Bowel sounds are normal.     Palpations: Abdomen is soft.     Tenderness:  There is abdominal tenderness in the epigastric area. There is no guarding or rebound.  Musculoskeletal:        General: Normal range of motion.     Cervical back: Normal range of motion and neck supple.  Lymphadenopathy:     Cervical: No cervical adenopathy.  Skin:    General: Skin is warm and dry.     Findings: No rash.  Neurological:     Mental Status: He is alert and oriented to person, place, and time.     ED Results / Procedures / Treatments   Labs (all labs ordered are listed, but only abnormal results are displayed) Labs Reviewed  COMPREHENSIVE METABOLIC PANEL WITH GFR - Abnormal; Notable for the following components:      Result Value   Sodium 133 (*)    Glucose, Bld 126 (*)    Creatinine, Ser 0.41 (*)    Albumin 3.1 (*)    AST 235 (*)    ALT 277 (*)    Alkaline Phosphatase 483 (*)    Total Bilirubin 13.0 (*)    All other components within normal limits  LIPASE, BLOOD - Abnormal; Notable for the following components:   Lipase 207 (*)    All other components within normal limits  CBC WITH DIFFERENTIAL/PLATELET - Abnormal; Notable for the following components:   RBC 4.02 (*)    Hemoglobin 12.7 (*)    HCT 36.3 (*)    RDW 16.3 (*)    Platelets 415 (*)    All other components within normal limits  URINALYSIS, ROUTINE W REFLEX MICROSCOPIC - Abnormal; Notable for the following components:   Color, Urine AMBER (*)    Bilirubin Urine MODERATE (*)    Protein, ur >=300 (*)    All other components within normal limits    EKG None  Radiology DG Chest 2 View Result Date: 05/16/2023 CLINICAL DATA:  Hemoptysis EXAM: CHEST - 2 VIEW COMPARISON:  Chest CT report only 10/20/2012 FINDINGS: The heart size and mediastinal contours are within normal limits. Both lungs are clear. The visualized skeletal structures are unremarkable. IMPRESSION: No active cardiopulmonary disease. Electronically Signed   By: Tyron Gallon M.D.   On: 05/16/2023 16:06    Procedures Procedures     Medications Ordered in ED Medications  ondansetron (ZOFRAN) injection 4 mg (4 mg Intravenous Given 05/16/23 1534)  morphine (PF) 4 MG/ML injection 4 mg (4 mg Intravenous Given 05/16/23 1534)  morphine (PF) 4 MG/ML injection 4 mg (4 mg Intravenous Given 05/16/23 1901)    ED Course/ Medical Decision Making/ A&P  Medical Decision Making Amount and/or Complexity of Data Reviewed Labs: ordered. Radiology: ordered.  Risk Prescription drug management. Decision regarding hospitalization.   Patient is a 53 year old who presents with upper abdominal pain.  He was recently admitted for pancreatitis.  He had associated elevation in his LFTs.  He was given IV fluids as well as IV morphine and Zofran.  He is feeling better.  His labs show elevated LFTs.  They are slightly higher than they were on his most recent discharge.  Also his bilirubin is continuing to rise.  Discussed with Alfonza Iles, PA-C with [facility] gastroenterology.  Question whether we need to do any repeat imaging.  They advised to hold off on imaging currently.  Will plan admission and they can see how his LFTs look tomorrow and decide whether he needs any repeat imaging.  Discussed with Dr. Hendrick Locke who will admit the patient for further treatment.  19:29: Dr. Brice Campi messaged and requested we go ahead and order a CT abd/pelvis and MRCP.  Ordered these studies and notified Dr. Hendrick Locke.  Final Clinical Impression(s) / ED Diagnoses Final diagnoses:  Acute pancreatitis, unspecified complication status, unspecified pancreatitis type    Rx / DC Orders ED Discharge Orders     None         Hershel Los, MD 05/16/23 9562    Hershel Los, MD 05/16/23 1930

## 2023-05-16 NOTE — H&P (Signed)
 Evan Sullivan EAV:[id number] DOB: 1970-05-14 DOA: 05/16/2023     PCP: Laneta Pintos, MD   Outpatient Specialists:      [facility]  Patient arrived to ER on 05/16/23 at 1313 Referred by Attending [name] Bosher, MD   Patient coming from:    home Lives alone,      Chief Complaint:   Chief Complaint  Patient presents with   Abdominal Pain    HPI: Evan Sullivan is a 53 y.o. male with medical history significant of pancreatitis diabetes hypertension hyperlipidemia, GERD, obstructive jaundice DM2  Presented with abdominal pain Patient came in reporting that he had 1 episode of hemoptysis and has been having some abdominal pain History of alcohol abuse last use was 1 year ago.  History of chronic pancreatitis last admission was in March discharged home on 3 April Patient was supposed to follow-up at [facility] for other procedures. Patient did endorse some nausea vomiting similar to prior pancreatitis no fevers no chills no cough except for 1 time Patient not on any blood thinners.   follows with [facility], Dr. Cherryl Corona with recent referral to [facility] in which she underwent EUS on 04/12/2023 showing sonographic changes consistent with severe chronic pancreatitis s/p biopsy with FNA negative for malignancy. Recommended repeat abdominal MRCP 6-8 weeks.   Denies significant ETOH intake   Does not smoke    Lab Results  Component Value Date   SARSCOV2NAA NEGATIVE 11/28/2019       Regarding pertinent Chronic problems:    Hyperlipidemia - on  fenofibrate Lipid Panel     Component Value Date/Time   CHOL 190 07/28/2022 0854   TRIG 119 07/28/2022 0854   HDL 81 07/28/2022 0854   CHOLHDL 2.3 07/28/2022 0854   CHOLHDL 3.4 05/14/2021 0516   VLDL 31 05/14/2021 0516   LDLCALC 88 07/28/2022 0854   LABVLDL 21 07/28/2022 0854     HTN on  norvasc, atenolol, lisinopril  GERD protonix       DM 2 -  Lab Results  Component Value Date   HGBA1C 6.8 11/03/2022     diet controlled   Chronic anemia - baseline hg Hemoglobin & Hematocrit  Recent Labs    05/04/23 0536 05/05/23 0544 05/16/23 1353  HGB 12.8* 14.0 12.7*   Iron/TIBC/Ferritin/ %Sat    Component Value Date/Time   IRON 58 07/30/2021 1354   TIBC 501 (H) 07/30/2021 1354   FERRITIN 23 (L) 07/30/2021 1354   IRONPCTSAT 12 (L) 07/30/2021 1354     While in ER:   Found to have increased LFTs up from baseline bili Lipase 207 Regarding the episode of hemoptysis it was isolated [facility] was made aware Dr. Brice Campi recommended CT abdomen pelvis and MRCP ordered they will see in the morning and then decide if able to proceed with management here or if patient needs to be transferred father to [facility]  Lab Orders         Comprehensive metabolic panel         Lipase, blood         CBC with Diff         Urinalysis, Routine w reflex microscopic -Urine, Clean Catch         Protime-INR         Bilirubin, direct      MRCP - Stable appearing moderate intrahepatic biliary dilatation and significant common bile duct dilatation in the porta hepatis. The common bile duct is markedly compressed  and becomes quite narrow in the upper head of the pancreas. This is likely due to significant acute on chronic pancreatitis. Findings similar to prior MRI.  Aaron Aas Persistent multiple intrapancreatic pseudocysts and extensive pancreatic calcifications, better seen on the CT scan. Marked stable dilatation of the main pancreatic duct. Stable moderate atrophy of the body and tail region of the pancreas.  CTabd/pelvis - Changes consistent with acute on chronic pancreatitis with slight increase in the degree of peripancreatic inflammatory change when compared with the prior exam. This causes compression upon the central pancreatic duct and common bile duct with dilatation of both peripherally. Ductal changes are stable in appearance.  CTA chest -  nonacute, no PE,  no evidence of infiltrate  Following Medications  were ordered in ER: Medications  ondansetron (ZOFRAN) injection 4 mg (4 mg Intravenous Given 05/16/23 1534)  morphine (PF) 4 MG/ML injection 4 mg (4 mg Intravenous Given 05/16/23 1534)  morphine (PF) 4 MG/ML injection 4 mg (4 mg Intravenous Given 05/16/23 1901)  iohexol (OMNIPAQUE) 300 MG/ML solution 100 mL (100 mLs Intravenous Contrast Given 05/16/23 2002)  iohexol (OMNIPAQUE) 350 MG/ML injection 75 mL (75 mLs Intravenous Contrast Given 05/16/23 2101)  gadobutrol (GADAVIST) 1 MMOL/ML injection 8 mL (8 mLs Intravenous Contrast Given 05/16/23 2125)    _______________________________________________________ ER Provider Called:     [facility]  Dr. Brice Campi  They Recommend admit to medicine   Will see in AM        ED Triage Vitals  Encounter Vitals Group     BP 05/16/23 1326 (!) 164/101     Systolic BP Percentile --      Diastolic BP Percentile --      Pulse Rate 05/16/23 1327 64     Resp 05/16/23 1327 14     Temp 05/16/23 1327 98.3 F (36.8 C)     Temp Source 05/16/23 1327 Oral     SpO2 05/16/23 1327 100 %     Weight 05/16/23 1324 182 lb 15.7 oz (83 kg)     Height 05/16/23 1324 6\' 1"  (1.854 m)     Head Circumference --      Peak Flow --      Pain Score 05/16/23 1324 7     Pain Loc --      Pain Education --      Exclude from Growth Chart --   WUJW(11)@     _________________________________________ Significant initial  Findings: Abnormal Labs Reviewed  COMPREHENSIVE METABOLIC PANEL WITH GFR - Abnormal; Notable for the following components:      Result Value   Sodium 133 (*)    Glucose, Bld 126 (*)    Creatinine, Ser 0.41 (*)    Albumin 3.1 (*)    AST 235 (*)    ALT 277 (*)    Alkaline Phosphatase 483 (*)    Total Bilirubin 13.0 (*)    All other components within normal limits  LIPASE, BLOOD - Abnormal; Notable for the following components:   Lipase 207 (*)    All other components within normal limits  CBC WITH DIFFERENTIAL/PLATELET - Abnormal; Notable for the following  components:   RBC 4.02 (*)    Hemoglobin 12.7 (*)    HCT 36.3 (*)    RDW 16.3 (*)    Platelets 415 (*)    All other components within normal limits  URINALYSIS, ROUTINE W REFLEX MICROSCOPIC - Abnormal; Notable for the following components:   Color, Urine AMBER (*)    Bilirubin Urine  MODERATE (*)    Protein, ur >=300 (*)    All other components within normal limits      ECG: Ordered     The recent clinical data is shown below. Vitals:   05/16/23 1327 05/16/23 1721 05/16/23 2133 05/16/23 2250  BP:  (!) 159/97  (!) 161/104  Pulse:  61  (!) 57  Resp: 14 16  20   Temp:  98.4 F (36.9 C) 98.4 F (36.9 C) (!) 97.5 F (36.4 C)  TempSrc:  Oral  Oral  SpO2:  100%  100%  Weight:    77.2 kg  Height:    6\' 1"  (1.854 m)    WBC     Component Value Date/Time   WBC 5.1 05/16/2023 1353   LYMPHSABS 1.1 05/16/2023 1353   LYMPHSABS 1.7 07/28/2022 0854   MONOABS 0.5 05/16/2023 1353   EOSABS 0.3 05/16/2023 1353   EOSABS 0.1 07/28/2022 0854   BASOSABS 0.0 05/16/2023 1353   BASOSABS 0.1 07/28/2022 0854     Lactic Acid, Venous    Component Value Date/Time   LATICACIDVEN 0.9 05/13/2021 2125      UA   no evidence of UTI    Urine analysis:    Component Value Date/Time   COLORURINE AMBER (A) 05/16/2023 1351   APPEARANCEUR CLEAR 05/16/2023 1351   APPEARANCEUR Clear 03/24/2021 1454   LABSPEC 1.023 05/16/2023 1351   PHURINE 5.0 05/16/2023 1351   GLUCOSEU NEGATIVE 05/16/2023 1351   HGBUR NEGATIVE 05/16/2023 1351   BILIRUBINUR MODERATE (A) 05/16/2023 1351   BILIRUBINUR negative 07/09/2022 1627   BILIRUBINUR Negative 03/24/2021 1454   KETONESUR NEGATIVE 05/16/2023 1351   PROTEINUR >=300 (A) 05/16/2023 1351   UROBILINOGEN 4.0 (A) 07/09/2022 1627   NITRITE NEGATIVE 05/16/2023 1351   LEUKOCYTESUR NEGATIVE 05/16/2023 1351    __________________________________________________________ Recent Labs  Lab 05/16/23 1353  NA 133*  K 3.5  CO2 23  GLUCOSE 126*  BUN 6  CREATININE 0.41*   CALCIUM 9.1    Cr   stable,    Lab Results  Component Value Date   CREATININE 0.41 (L) 05/16/2023   CREATININE 0.62 05/06/2023   CREATININE 0.48 (L) 05/05/2023    Recent Labs  Lab 05/12/23 0946 05/16/23 1353  AST 51* 235*  ALT 134* 277*  ALKPHOS 380* 483*  BILITOT 12.7* 13.0*  PROT 6.6 7.3  ALBUMIN 3.7 3.1*   Lab Results  Component Value Date   CALCIUM 9.1 05/16/2023   PHOS 3.2 05/06/2023    Plt: Lab Results  Component Value Date   PLT 415 (H) 05/16/2023       Recent Labs  Lab 05/16/23 1353  WBC 5.1  NEUTROABS 3.2  HGB 12.7*  HCT 36.3*  MCV 90.3  PLT 415*    HG/HCT stable,       Component Value Date/Time   HGB 12.7 (L) 05/16/2023 1353   HGB 15.8 07/28/2022 0854   HCT 36.3 (L) 05/16/2023 1353   HCT 48.3 07/28/2022 0854   MCV 90.3 05/16/2023 1353   MCV 92 07/28/2022 0854   MCV 92 09/12/2012 1623     Recent Labs  Lab 05/16/23 1353  LIPASE 207*      _______________________________________________ Hospitalist was called for admission for   Acute on chronic pancreatitis,     The following Work up has been ordered so far:  Orders Placed This Encounter  Procedures   DG Chest 2 View   CT ABDOMEN PELVIS W CONTRAST   MR ABDOMEN MRCP W WO  CONTAST   CT Angio Chest PE W/Cm &/Or Wo Cm   Comprehensive metabolic panel   Lipase, blood   CBC with Diff   Urinalysis, Routine w reflex microscopic -Urine, Clean Catch   Protime-INR   Bilirubin, direct   Diet NPO time specified   Initiate Carrier Fluid Protocol   Cardiac Monitoring Continuous x 24 hours Indications for use: Other; Other indications for use: pancreatitis   Consult to gastroenterology   Consult to hospitalist   Insert peripheral IV   Place in observation (patient's expected length of stay will be less than 2 midnights)     OTHER Significant initial  Findings:  labs showing:     DM  labs:  HbA1C: Recent Labs    07/28/22 0854 11/03/22 1421  HGBA1C 6.7* 6.8       CBG (last  3)  No results for input(s): "GLUCAP" in the last 72 hours.        Cultures:    Component Value Date/Time   SDES  07/20/2017 0950    URINE, RANDOM Performed at [facility], [address] Johnella Naas [place], [place] [address]    [facility]  07/20/2017 [phone]    Normal Performed at [facility], [address] [place], [place] [address]    CULT (A) 07/20/2017 0950    <10,000 COLONIES/mL INSIGNIFICANT GROWTH Performed at [facility], [address] [place], [place] [address]    REPTSTATUS 07/21/2017 FINAL 07/20/2017 0950     Radiological Exams on Admission: MR ABDOMEN MRCP W WO CONTAST Result Date: 05/16/2023 CLINICAL DATA:  Follow-up pancreatitis. EXAM: MRI ABDOMEN WITHOUT AND WITH CONTRAST (INCLUDING MRCP) TECHNIQUE: Multiplanar multisequence MR imaging of the abdomen was performed both before and after the administration of intravenous contrast. Heavily T2-weighted images of the biliary and pancreatic ducts were obtained, and three-dimensional MRCP images were rendered by post processing. CONTRAST:  8mL GADAVIST GADOBUTROL 1 MMOL/ML IV SOLN COMPARISON:  CT scan, same date.  Prior MRI abdomen 05/03/2023 FINDINGS: Lower chest: The lung bases are clear of an acute process. No pleural or pericardial effusion. Hepatobiliary: Stable appearing moderate intrahepatic biliary dilatation. There is also persistent significant common bile duct dilatation in the porta hepatis. The common bile duct is markedly compressed and becomes quite narrow in the upper head of the pancreas. This is likely due to significant acute on chronic pancreatitis. No hepatic lesions are identified. The portal and hepatic veins are patent. The gallbladder is distended and contains layering high T1 material which is likely inspissated bile. There is also some layering sludge or small gallstones. Mild pericholecystic fluid likely related to the patient's pancreatitis. No common bile duct stones are identified.  Pancreas: Significant inflammation/edema involving the head of the pancreas along with peripancreatic and periduodenal fluid. Persistent multiple intrapancreatic pseudocysts and extensive pancreatic calcifications, better seen on the CT scan. Marked stable dilatation of the main pancreatic duct. Stable moderate atrophy of the body and tail region of the pancreas. Spleen:  Normal size.  No focal lesions. Adrenals/Urinary Tract: The adrenal glands and kidneys are unremarkable and stable. Stomach/Bowel: There is significant inflammation of the second and third portions of the duodenum surrounded by inflammatory phlegmon and fluid. Is moderate enhancement of the duodenal wall but no mechanical or functional obstruction. Stomach is unremarkable. The visualized small bowel colon are grossly normal. Vascular/Lymphatic: No pathologically enlarged lymph nodes identified. No abdominal aortic aneurysm demonstrated. The portal and splenic veins are patent. The SMV is patent. Other: Small amount of fluid  surrounding the pancreatic head and duodenum and also a small amount of fluid in the right anterior pararenal space but no overt ascites. Musculoskeletal: No significant bony findings. IMPRESSION: 1. Stable appearing moderate intrahepatic biliary dilatation and significant common bile duct dilatation in the porta hepatis. The common bile duct is markedly compressed and becomes quite narrow in the upper head of the pancreas. This is likely due to significant acute on chronic pancreatitis. Findings similar to prior MRI. 2. Distended gallbladder containing layering high T1 material which is likely inspissated bile. There is also some layering sludge or small gallstones. Mild pericholecystic fluid likely related to the patient's pancreatitis. No common bile duct stones are identified. 3. Significant inflammation/edema involving the head of the pancreas along with peripancreatic and periduodenal fluid. Persistent multiple  intrapancreatic pseudocysts and extensive pancreatic calcifications, better seen on the CT scan. Marked stable dilatation of the main pancreatic duct. Stable moderate atrophy of the body and tail region of the pancreas. 4. Significant inflammation of the second and third portions of the duodenum surrounded by inflammatory phlegmon and fluid. No mechanical or functional obstruction. Electronically Signed   By: Marrian Siva M.D.   On: 05/16/2023 22:06   CT Angio Chest PE W/Cm &/Or Wo Cm Result Date: 05/16/2023 CLINICAL DATA:  Pulmonary embolism (PE) suspected, high prob. Hemoptysis. EXAM: CT ANGIOGRAPHY CHEST WITH CONTRAST TECHNIQUE: Multidetector CT imaging of the chest was performed using the standard protocol during bolus administration of intravenous contrast. Multiplanar CT image reconstructions and MIPs were obtained to evaluate the vascular anatomy. RADIATION DOSE REDUCTION: This exam was performed according to the departmental dose-optimization program which includes automated exposure control, adjustment of the mA and/or kV according to patient size and/or use of iterative reconstruction technique. CONTRAST:  75mL OMNIPAQUE IOHEXOL 350 MG/ML SOLN COMPARISON:  10/15/2012 FINDINGS: Cardiovascular: No filling defects in the pulmonary arteries to suggest pulmonary emboli. Heart is normal size. Aorta is normal caliber. Mediastinum/Nodes: No mediastinal, hilar, or axillary adenopathy. Trachea and esophagus are unremarkable. Thyroid unremarkable. Lungs/Pleura: No confluent opacities or effusions. Upper Abdomen: See report from abdominal CT today. Musculoskeletal: Chest wall soft tissues are unremarkable. No acute bony abnormality. Review of the MIP images confirms the above findings. IMPRESSION: No evidence of pulmonary embolus. No acute cardiopulmonary disease. Electronically Signed   By: Janeece Mechanic M.D.   On: 05/16/2023 21:25   CT ABDOMEN PELVIS W CONTRAST Result Date: 05/16/2023 CLINICAL DATA:  Abdominal  pain and known pancreatitis EXAM: CT ABDOMEN AND PELVIS WITH CONTRAST TECHNIQUE: Multidetector CT imaging of the abdomen and pelvis was performed using the standard protocol following bolus administration of intravenous contrast. RADIATION DOSE REDUCTION: This exam was performed according to the departmental dose-optimization program which includes automated exposure control, adjustment of the mA and/or kV according to patient size and/or use of iterative reconstruction technique. CONTRAST:  100mL OMNIPAQUE IOHEXOL 300 MG/ML  SOLN COMPARISON:  MRI from 05/03/2023, CT from 05/03/2023 FINDINGS: Lower chest: No acute abnormality. Hepatobiliary: Liver is well visualized with mild intra hepatic ductal dilatation similar to that seen on the prior exam. Gallbladder is well distended without evidence of cholelithiasis. The common bile duct is prominent as is the pancreatic duct secondary to changes of prior pancreatitis in the region of the pancreatic head. Pancreas: Pancreas demonstrates scattered calcifications consistent with chronic pancreatitis. Dilatation of the pancreatic duct is again seen. The head and uncinate process are enlarged but relatively stable from the prior exam. Slight increase in the degree of peripancreatic inflammatory changes noted consistent  with an acute on chronic component. Spleen: Normal in size without focal abnormality. Adrenals/Urinary Tract: Adrenal glands are within normal limits. Kidneys demonstrate a normal enhancement pattern bilaterally. No renal calculi or obstructive changes are noted. The bladder is within normal limits. Stomach/Bowel: No obstructive or inflammatory changes of the colon are seen. The appendix is within normal limits. Small bowel and stomach are unremarkable with the exception of inflammatory changes in the second portion the duodenum related to the adjacent pancreatitis. These changes are similar in appearance. Vascular/Lymphatic: Aortic atherosclerosis. No enlarged  abdominal or pelvic lymph nodes. Reproductive: Prostate is unremarkable. Other: No abdominal wall hernia or abnormality. No abdominopelvic ascites. Musculoskeletal: Right hip replacement is noted. Mild degenerative changes of lumbar spine are seen. IMPRESSION: Changes consistent with acute on chronic pancreatitis with slight increase in the degree of peripancreatic inflammatory change when compared with the prior exam. This causes compression upon the central pancreatic duct and common bile duct with dilatation of both peripherally. Ductal changes are stable in appearance. No other focal abnormality is noted. Electronically Signed   By: Violeta Grey M.D.   On: 05/16/2023 20:25   DG Chest 2 View Result Date: 05/16/2023 CLINICAL DATA:  Hemoptysis EXAM: CHEST - 2 VIEW COMPARISON:  Chest CT report only 10/20/2012 FINDINGS: The heart size and mediastinal contours are within normal limits. Both lungs are clear. The visualized skeletal structures are unremarkable. IMPRESSION: No active cardiopulmonary disease. Electronically Signed   By: Tyron Gallon M.D.   On: 05/16/2023 16:06   _______________________________________________________________________________________________________ Latest  Blood pressure (!) 161/104, pulse (!) 57, temperature (!) 97.5 F (36.4 C), temperature source Oral, resp. rate 20, height 6\' 1"  (1.854 m), weight 77.2 kg, SpO2 100%.   Vitals  labs and radiology finding personally reviewed  Review of Systems:    Pertinent positives include:   abdominal pain, nausea,  coughing up of blood. Constitutional:  No weight loss, night sweats, Fevers, chills, fatigue, weight loss  HEENT:  No headaches, Difficulty swallowing,Tooth/dental problems,Sore throat,  No sneezing, itching, ear ache, nasal congestion, post nasal drip,  Cardio-vascular:  No chest pain, Orthopnea, PND, anasarca, dizziness, palpitations.no Bilateral lower extremity swelling  GI:  No heartburn, indigestion,vomiting,  diarrhea, change in bowel habits, loss of appetite, melena, blood in stool, hematemesis Resp:  no shortness of breath at rest. No dyspnea on exertion, No excess mucus, no productive cough, No non-productive cough, No No change in color of mucus.No wheezing. Skin:  no rash or lesions. No jaundice GU:  no dysuria, change in color of urine, no urgency or frequency. No straining to urinate.  No flank pain.  Musculoskeletal:  No joint pain or no joint swelling. No decreased range of motion. No back pain.  Psych:  No change in mood or affect. No depression or anxiety. No memory loss.  Neuro: no localizing neurological complaints, no tingling, no weakness, no double vision, no gait abnormality, no slurred speech, no confusion  All systems reviewed and apart from HOPI all are negative _______________________________________________________________________________________________ Past Medical History:   Past Medical History:  Diagnosis Date   Anemia    Arthritis    Avascular necrosis of femoral head, right (HCC) 2019   Diet-controlled type 2 diabetes mellitus (HCC) 09/07/2021   GERD (gastroesophageal reflux disease)    Gout    Hypertension    Peptic ulcer disease       Past Surgical History:  Procedure Laterality Date   COLONOSCOPY     COLONOSCOPY WITH PROPOFOL N/A 11/30/2019  Procedure: COLONOSCOPY WITH PROPOFOL;  Surgeon: Selena Daily, MD;  Location: [facility];  Service: Endoscopy;  Laterality: N/A;  priority 4   KNEE ARTHROSCOPY, MEDIAL PATELLO FEMORAL LIGAMENT REPAIR Bilateral 2004,2000   POLYPECTOMY  11/30/2019   Procedure: POLYPECTOMY;  Surgeon: Selena Daily, MD;  Location: [facility];  Service: Endoscopy;;   TOTAL HIP ARTHROPLASTY Right 08/02/2017   Procedure: TOTAL HIP ARTHROPLASTY;  Surgeon: Arlyne Lame, MD;  Location: [facility];  Service: Orthopedics;  Laterality: Right;    Social History:  Ambulatory   independently     reports that he  has never smoked. He has never used smokeless tobacco. He reports that he does not currently use alcohol. He reports current drug use. Drug: Marijuana.   Family History:   Family History  Problem Relation Age of Onset   Hypertension Mother    Hypertension Father    Colon cancer Father 44   Diabetes Father    Pancreatic cancer Neg Hx    Esophageal cancer Neg Hx    Liver cancer Neg Hx    Stomach cancer Neg Hx    ______________________________________________________________________________________________ Allergies: No Known Allergies   Prior to Admission medications   Medication Sig Start Date End Date Taking? Authorizing Provider  amLODipine (NORVASC) 10 MG tablet TAKE 1 TABLET BY MOUTH EVERY DAY 04/19/23  Yes Laneta Pintos, MD  atenolol (TENORMIN) 100 MG tablet TAKE 1 TABLET BY MOUTH EVERY DAY 04/19/23  Yes Olson, Daniel K, MD  colchicine 0.6 MG tablet Take 1 capsule by mouth daily as needed (gout pain) 06/26/20  Yes McDonough, Lauren K, PA-C  CREON 36000-114000 units CPEP capsule TAKE 2 CAPSULES (72,000 UNITS TOTAL) BY MOUTH WITH BREAKFAST, WITH LUNCH, AND WITH EVENING MEAL. 04/14/23  Yes Elois Hair, MD  diphenhydramine-acetaminophen (TYLENOL PM) 25-500 MG TABS tablet Take 2 tablets by mouth at bedtime as needed (Sleep/Pain).   Yes [provider]  fenofibrate (TRICOR) 48 MG tablet TAKE 1 TABLET BY MOUTH EVERY DAY 03/25/23  Yes Olson, Daniel K, MD  hydrocortisone cream 1 % Apply 1 application  topically daily as needed (rash).   Yes [provider]  lisinopril (ZESTRIL) 20 MG tablet Take 1 tablet (20 mg total) by mouth daily. 05/06/23  Yes Shalhoub, Merrill Abide, MD  ondansetron (ZOFRAN) 4 MG tablet Dissolve 1 tablet (4 mg total) by mouth daily as needed for nausea or vomiting. 05/06/23 05/05/24 Yes Shalhoub, Merrill Abide, MD  pantoprazole (PROTONIX) 40 MG tablet TAKE 1 TABLET (40 MG TOTAL) BY MOUTH DAILY TAKE 30 MINUTES BEFORE BREAKFAST Patient taking differently: Take 40  mg by mouth daily as needed (Indisgestion). 04/15/23  Yes Tory Freiberg, NP    ___________________________________________________________________________________________________ Physical Exam:    05/16/2023   10:50 PM 05/16/2023    5:21 PM 05/16/2023    1:27 PM  Vitals with BMI  Height 6\' 1"     Weight 170 lbs 5 oz    BMI 22.47    Systolic 161 159   Diastolic 104 97   Pulse 57 61 64     1. General:  in No  Acute distress   Chronically ill   -appearing 2. Psychological: Alert and   Oriented 3. Head/ENT:   Dry Mucous Membranes                          Head Non traumatic, neck supple  Poor Dentition 4. SKIN:  decreased Skin turgor,  Skin clean Dry and intact no rash    5. Heart: Regular rate and rhythm no  Murmur, no Rub or gallop 6. Lungs:  no wheezes or crackles   7. Abdomen: Soft,  epigastric-tender, Non distended bowel sounds present 8. Lower extremities: no clubbing, cyanosis, no  edema 9. Neurologically Grossly intact, moving all 4 extremities equally   10. MSK: Normal range of motion    Chart has been reviewed  ______________________________________________________________________________________________  Assessment/Plan 53 y.o. male with medical history significant of pancreatitis diabetes hypertension hyperlipidemia, GERD, obstructive jaundice DM2    Admitted for   Acute on chronic pancreatitis,      Present on Admission:  Pancreatitis  Acute on chronic pancreatitis (HCC)  Alcohol abuse  Essential hypertension  GERD without esophagitis  Mixed diabetic hyperlipidemia associated with type 2 diabetes mellitus (HCC)     Acute on chronic pancreatitis (HCC) most likely cause being alcohol induced, appreciate [facility] consult  Will need to review imaging in AM and determine further paln of care Lipase     Component Value Date/Time   LIPASE 207 (H) 05/16/2023 1353   Lipid Panel     Component Value Date/Time   CHOL 190  07/28/2022 0854   TRIG 119 07/28/2022 0854   HDL 81 07/28/2022 0854   CHOLHDL 2.3 07/28/2022 0854   CHOLHDL 3.4 05/14/2021 0516   VLDL 31 05/14/2021 0516   LDLCALC 88 07/28/2022 0854   LABVLDL 21 07/28/2022 0854    Will rehydrate with aggressive IV fluids Keep clear Follow clinically     Control pain with IV pain medications   May need [facility] but first need [facility] consult  Alcohol abuse In remission  Essential hypertension Resume Norvasc at 10 mg a day  GERD without esophagitis Resume Protonix  Mixed diabetic hyperlipidemia associated with type 2 diabetes mellitus (HCC) Diet controlled sliding scale ordered   Other plan as per orders.  DVT prophylaxis:  SCD     Code Status:    Code Status: Prior FULL CODE  as per patient   I had personally discussed CODE STATUS with patient   ACP   none    Family Communication:   Family not at  Bedside    Diet  Diet Orders (From admission, onward)     Start     Ordered   05/16/23 2347  Diet clear liquid Room service appropriate? Yes; Fluid consistency: Thin  Diet effective now       Question Answer Comment  Room service appropriate? Yes   Fluid consistency: Thin      05/17/23 0038            Disposition Plan:        To home once workup is complete and patient is stable   Following barriers for discharge:                                                    Electrolytes corrected  Pain controlled with PO medications                                                        Will need consultants to evaluate patient prior to discharge       Consult Orders  (From admission, onward)           Start     Ordered   05/16/23 1830  Consult to hospitalist  Once       Provider:  (Not yet assigned)  Question Answer Comment  Place call to: [facility]   Reason for Consult Admit      05/16/23 1829                      Consults called:  [facility]    Treatment Team:  Albertina Hugger, MD  Admission status:  ED Disposition     ED Disposition  Admit   Condition  --   Comment  Hospital Area: [facility] [[id number]]  Level of Care: Telemetry [5]  Admit to tele based on following criteria: Other see comments  Comments: pancreatitis  May place patient in observation at [facility] or [facility] if equivalent level of care is available:: No  Covid Evaluation: Asymptomatic - no recent exposure (last 10 days) testing not required  Diagnosis: Pancreatitis [161096]  Admitting Physician: Lani Mendiola [[id number]]  Attending Physician: Nikiyah Fackler [[id number]]           Obs    Level of care     tele  For 12H      Lacreasha Hinds 05/17/2023, 12:59 AM    [facility]     after 2 AM please page floor coverage PA If 7AM-7PM, please contact the day team taking care of the patient using [email or web address]

## 2023-05-16 NOTE — ED Triage Notes (Addendum)
 Pt reports with upper abdominal pain that started back up today along with spitting up dark red blood when coughing. Pt reports recently being admitted.

## 2023-05-16 NOTE — Subjective & Objective (Signed)
 Patient came in reporting that he had 1 episode of hemoptysis and has been having some abdominal pain History of alcohol abuse last use was 1 year ago.  History of chronic pancreatitis last admission was in March discharged home on 3 April Patient was supposed to follow-up at [facility] for other procedures. Patient did endorse some nausea vomiting similar to prior pancreatitis no fevers no chills no cough except for 1 time Patient not on any blood thinners.

## 2023-05-16 NOTE — H&P (Incomplete)
 Evan Sullivan:[id number] DOB: 09-Jul-1970 DOA: 05/16/2023     PCP: Laneta Pintos, MD   Outpatient Specialists:      [facility]  Patient arrived to ER on 05/16/23 at 1313 Referred by Attending Pradeep Beaubrun, MD   Patient coming from:    home Lives alone,      Chief Complaint:   Chief Complaint  Patient presents with  . Abdominal Pain    HPI: Evan Sullivan is a 53 y.o. male with medical history significant of pancreatitis diabetes hypertension hyperlipidemia  Presented with abdominal pain Patient came in reporting that he had 1 episode of hemoptysis and has been having some abdominal pain History of alcohol abuse last use was 1 year ago.  History of chronic pancreatitis last admission was in March discharged home on 3 April Patient was supposed to follow-up at [facility] for other procedures. Patient did endorse some nausea vomiting similar to prior pancreatitis no fevers no chills no cough except for 1 time Patient not on any blood thinners.     Denies significant ETOH intake   Does not smoke    Lab Results  Component Value Date   SARSCOV2NAA NEGATIVE 11/28/2019        Regarding pertinent Chronic problems: ***  ****Hyperlipidemia - *on statins {statin:315258}  Lipid Panel     Component Value Date/Time   CHOL 190 07/28/2022 0854   TRIG 119 07/28/2022 0854   HDL 81 07/28/2022 0854   CHOLHDL 2.3 07/28/2022 0854   CHOLHDL 3.4 05/14/2021 0516   VLDL 31 05/14/2021 0516   LDLCALC 88 07/28/2022 0854   LABVLDL 21 07/28/2022 0854    ***HTN on   ***chronic CHF diastolic/systolic/ combined - last echo*** No results found for this or any previous visit (from the past 81191 hours).  *** CAD  - On Aspirin, statin, betablocker, Plavix                 - *followed by cardiology                - last cardiac cath       ***DM 2 -  Lab Results  Component Value Date   HGBA1C 6.8 11/03/2022   ****on insulin, PO meds only, diet controlled  ***Hypothyroidism:   Lab  Results  Component Value Date   TSH 1.680 07/28/2022   on synthroid  *** Morbid obesity-   BMI Readings from Last 1 Encounters:  05/16/23 22.47 kg/m     *** Asthma -well *** controlled on home inhalers/ nebs                     *** COPD - not **followed by pulmonology *** not  on baseline oxygen  *L,    *** OSA -on nocturnal oxygen, *CPAP, *noncompliant with CPAP  *** Hx of CVA - *with/out residual deficits on Aspirin 81 mg, 325, Plavix  ***A. Fib -   atrial fibrillation CHA2DS2 vas score **** CHA2DS2/VAS Stroke Risk Points      N/A >= 2 Points: High Risk  1 to 1.99 Points: Medium Risk  0 Points: Low Risk    Last Change: N/A      This score determines the patient's risk of having a stroke if the  patient has atrial fibrillation.      This score is not applicable to this patient. Components are not  calculated.     current  on anticoagulation with ****Coumadin  ***Xarelto,* Eliquis,  ***  Not on anticoagulation secondary to Risk of Falls, *** recurrent bleeding         -  Rate control:  Currently controlled with ***Toprolol,  *Metoprolol,* Diltiazem, *Coreg          - Rhythm control: *** amiodarone, *flecainide  ***Hx of DVT/PE on - anticoagulation with ****Coumadin  ***Xarelto,* Eliquis,     ***CKD stage III*-   baseline Cr **** Estimated Creatinine Clearance: 119.3 mL/min (A) (by C-G formula based on SCr of 0.41 mg/dL (L)).  Lab Results  Component Value Date   CREATININE 0.41 (L) 05/16/2023   CREATININE 0.62 05/06/2023   CREATININE 0.48 (L) 05/05/2023   Lab Results  Component Value Date   NA 133 (L) 05/16/2023   CL 101 05/16/2023   K 3.5 05/16/2023   CO2 23 05/16/2023   BUN 6 05/16/2023   CREATININE 0.41 (L) 05/16/2023   GFRNONAA >60 05/16/2023   CALCIUM 9.1 05/16/2023   PHOS 3.2 05/06/2023   ALBUMIN 3.1 (L) 05/16/2023   GLUCOSE 126 (H) 05/16/2023    **** Liver disease MELD 3.0: 20 at 05/16/2023  7:27 PM MELD-Na: 20 at 05/16/2023  7:27 PM Calculated  from: Serum Creatinine: 0.41 mg/dL (Using min of 1 mg/dL) at 5/40/9811  9:14 PM Serum Sodium: 133 mmol/L at 05/16/2023  1:53 PM Total Bilirubin: 13 mg/dL at 7/82/9562  1:30 PM Serum Albumin: 3.1 g/dL at 8/65/7846  9:62 PM INR(ratio): 1.1 at 05/16/2023  7:27 PM Age at listing (hypothetical): 52 years Sex: Male at 05/16/2023  7:27 PM  Hepatic Function Panel     Component Value Date/Time   PROT 7.3 05/16/2023 1353   PROT 7.7 07/28/2022 0854   ALBUMIN 3.1 (L) 05/16/2023 1353   ALBUMIN 4.8 07/28/2022 0854   AST 235 (H) 05/16/2023 1353   ALT 277 (H) 05/16/2023 1353   ALKPHOS 483 (H) 05/16/2023 1353   BILITOT 13.0 (H) 05/16/2023 1353   BILITOT 0.4 07/28/2022 0854   BILIDIR 7.6 (H) 05/12/2023 0946   IBILI 2.9 (H) 05/06/2023 0532   1.1  ***BPH - on Flomax, Proscar    *** Dementia - on Aricept** Nemenda  *** Chronic anemia - baseline hg Hemoglobin & Hematocrit  Recent Labs    05/04/23 0536 05/05/23 0544 05/16/23 1353  HGB 12.8* 14.0 12.7*   Iron/TIBC/Ferritin/ %Sat    Component Value Date/Time   IRON 58 07/30/2021 1354   TIBC 501 (H) 07/30/2021 1354   FERRITIN 23 (L) 07/30/2021 1354   IRONPCTSAT 12 (L) 07/30/2021 1354     Seizure DO - las seizure *** currently on     Cancer:     While in ER:         Lab Orders         Comprehensive metabolic panel         Lipase, blood         CBC with Diff         Urinalysis, Routine w reflex microscopic -Urine, Clean Catch         Protime-INR         Bilirubin, direct      CT HEAD *** NON acute   MRI brain  ***no acute CVA  CXR - ***NON acute  CTabd/pelvis - ***nonacute  CTA chest - ***nonacute, no PE, * no evidence of infiltrate  Following Medications were ordered in ER: Medications  ondansetron (ZOFRAN) injection 4 mg (4 mg Intravenous Given 05/16/23 1534)  morphine (PF) 4 MG/ML injection 4 mg (4 mg Intravenous  Given 05/16/23 1534)  morphine (PF) 4 MG/ML injection 4 mg (4 mg Intravenous Given 05/16/23 1901)   iohexol (OMNIPAQUE) 300 MG/ML solution 100 mL (100 mLs Intravenous Contrast Given 05/16/23 2002)  iohexol (OMNIPAQUE) 350 MG/ML injection 75 mL (75 mLs Intravenous Contrast Given 05/16/23 2101)  gadobutrol (GADAVIST) 1 MMOL/ML injection 8 mL (8 mLs Intravenous Contrast Given 05/16/23 2125)    _______________________________________________________ ER Provider Called:       DrAaron Aas  They Recommend admit to medicine *** Will see in AM  ***SEEN in ER     ED Triage Vitals  Encounter Vitals Group     BP 05/16/23 1326 (!) 164/101     Systolic BP Percentile --      Diastolic BP Percentile --      Pulse Rate 05/16/23 1327 64     Resp 05/16/23 1327 14     Temp 05/16/23 1327 98.3 F (36.8 C)     Temp Source 05/16/23 1327 Oral     SpO2 05/16/23 1327 100 %     Weight 05/16/23 1324 182 lb 15.7 oz (83 kg)     Height 05/16/23 1324 6\' 1"  (1.854 m)     Head Circumference --      Peak Flow --      Pain Score 05/16/23 1324 7     Pain Loc --      Pain Education --      Exclude from Growth Chart --   GNFA(21)@     _________________________________________ Significant initial  Findings: Abnormal Labs Reviewed  COMPREHENSIVE METABOLIC PANEL WITH GFR - Abnormal; Notable for the following components:      Result Value   Sodium 133 (*)    Glucose, Bld 126 (*)    Creatinine, Ser 0.41 (*)    Albumin 3.1 (*)    AST 235 (*)    ALT 277 (*)    Alkaline Phosphatase 483 (*)    Total Bilirubin 13.0 (*)    All other components within normal limits  LIPASE, BLOOD - Abnormal; Notable for the following components:   Lipase 207 (*)    All other components within normal limits  CBC WITH DIFFERENTIAL/PLATELET - Abnormal; Notable for the following components:   RBC 4.02 (*)    Hemoglobin 12.7 (*)    HCT 36.3 (*)    RDW 16.3 (*)    Platelets 415 (*)    All other components within normal limits  URINALYSIS, ROUTINE W REFLEX MICROSCOPIC - Abnormal; Notable for the following components:   Color, Urine AMBER  (*)    Bilirubin Urine MODERATE (*)    Protein, ur >=300 (*)    All other components within normal limits      _________________________ Troponin ***ordered Cardiac Panel (last 3 results) No results for input(s): "CKTOTAL", "CKMB", "TROPONINIHS", "RELINDX" in the last 72 hours.   ECG: Ordered Personally reviewed and interpreted by me showing: HR : *** Rhythm: *NSR, Sinus tachycardia * A.fib. W RVR, RBBB, LBBB, Paced Ischemic changes*nonspecific changes, no evidence of ischemic changes QTC*  BNP (last 3 results) No results for input(s): "BNP" in the last 8760 hours.   COVID-19 Labs  No results for input(s): "DDIMER", "FERRITIN", "LDH", "CRP" in the last 72 hours.  Lab Results  Component Value Date   SARSCOV2NAA NEGATIVE 11/28/2019       ____________________ This patient meets SIRS Criteria and may be septic. SIRS = Systemic Inflammatory Response Syndrome  Order a lactic acid level if needed AND/OR Initiate the sepsis  protocol with the attached order set OR Click "Treating Associated Infection or Illness" if the patient is being treated for an infection that is a known cause of these abnormalities     The recent clinical data is shown below. Vitals:   05/16/23 1327 05/16/23 1721 05/16/23 2133 05/16/23 2250  BP:  (!) 159/97  (!) 161/104  Pulse:  61  (!) 57  Resp: 14 16  20   Temp:  98.4 F (36.9 C) 98.4 F (36.9 C) (!) 97.5 F (36.4 C)  TempSrc:  Oral  Oral  SpO2:  100%  100%  Weight:    77.2 kg  Height:    6\' 1"  (1.854 m)        WBC     Component Value Date/Time   WBC 5.1 05/16/2023 1353   LYMPHSABS 1.1 05/16/2023 1353   LYMPHSABS 1.7 07/28/2022 0854   MONOABS 0.5 05/16/2023 1353   EOSABS 0.3 05/16/2023 1353   EOSABS 0.1 07/28/2022 0854   BASOSABS 0.0 05/16/2023 1353   BASOSABS 0.1 07/28/2022 0854        Lactic Acid, Venous    Component Value Date/Time   LATICACIDVEN 0.9 05/13/2021 2125      Lactic Acid, Venous    Component Value  Date/Time   LATICACIDVEN 0.9 05/13/2021 2125    Procalcitonin *** Ordered      UA *** no evidence of UTI  ***Pending ***not ordered   Urine analysis:    Component Value Date/Time   COLORURINE AMBER (A) 05/16/2023 1351   APPEARANCEUR CLEAR 05/16/2023 1351   APPEARANCEUR Clear 03/24/2021 1454   LABSPEC 1.023 05/16/2023 1351   PHURINE 5.0 05/16/2023 1351   GLUCOSEU NEGATIVE 05/16/2023 1351   HGBUR NEGATIVE 05/16/2023 1351   BILIRUBINUR MODERATE (A) 05/16/2023 1351   BILIRUBINUR negative 07/09/2022 1627   BILIRUBINUR Negative 03/24/2021 1454   KETONESUR NEGATIVE 05/16/2023 1351   PROTEINUR >=300 (A) 05/16/2023 1351   UROBILINOGEN 4.0 (A) 07/09/2022 1627   NITRITE NEGATIVE 05/16/2023 1351   LEUKOCYTESUR NEGATIVE 05/16/2023 1351    Results for orders placed or performed in visit on 07/09/22  Urine Culture     Status: Abnormal   Collection Time: 07/09/22 12:00 AM   Specimen: Urine   Urine  Result Value Ref Range Status   Urine Culture, Routine Final report (A)  Final   Organism ID, Bacteria Escherichia coli (A)  Final    Comment: Cefazolin with an MIC <=16 predicts susceptibility to the oral agents cefaclor, cefdinir, cefpodoxime, cefprozil, cefuroxime, cephalexin, and loracarbef when used for therapy of uncomplicated urinary tract infections due to E. coli, Klebsiella pneumoniae, and Proteus mirabilis. 50,000-100,000 colony forming units per mL    Antimicrobial Susceptibility Comment  Final    Comment:       ** S = Susceptible; I = Intermediate; R = Resistant **                    P = Positive; N = Negative             MICS are expressed in micrograms per mL    Antibiotic                 RSLT#1    RSLT#2    RSLT#3    RSLT#4 Amoxicillin/Clavulanic Acid    R Ampicillin                     R Cefazolin  S Cefepime                       S Ceftriaxone                    S Cefuroxime                     I Ciprofloxacin                  S Ertapenem                       S Gentamicin                     S Imipenem                       S Levofloxacin                   S Meropenem                      S Nitrofurantoin                 S Piperacillin/Tazobactam        S Tetracycline                   R Tobramycin                     S Trimethoprim/Sulfa             S     ABX started Antibiotics Given (last 72 hours)     None       No results found for the last 90 days.     ________________________________________________________________  Arterial ***Venous  Blood Gas result:  pH *** pCO2 ***; pO2 ***;     %O2 Sat ***.  ABG No results found for: "PHART", "PCO2ART", "PO2ART", "HCO3", "TCO2", "ACIDBASEDEF", "O2SAT"     __________________________________________________________ Recent Labs  Lab 05/16/23 1353  NA 133*  K 3.5  CO2 23  GLUCOSE 126*  BUN 6  CREATININE 0.41*  CALCIUM 9.1    Cr  * stable,  Up from baseline see below Lab Results  Component Value Date   CREATININE 0.41 (L) 05/16/2023   CREATININE 0.62 05/06/2023   CREATININE 0.48 (L) 05/05/2023    Recent Labs  Lab 05/12/23 0946 05/16/23 1353  AST 51* 235*  ALT 134* 277*  ALKPHOS 380* 483*  BILITOT 12.7* 13.0*  PROT 6.6 7.3  ALBUMIN 3.7 3.1*   Lab Results  Component Value Date   CALCIUM 9.1 05/16/2023   PHOS 3.2 05/06/2023          Plt: Lab Results  Component Value Date   PLT 415 (H) 05/16/2023         Recent Labs  Lab 05/16/23 1353  WBC 5.1  NEUTROABS 3.2  HGB 12.7*  HCT 36.3*  MCV 90.3  PLT 415*    HG/HCT * stable,  Down *Up from baseline see below    Component Value Date/Time   HGB 12.7 (L) 05/16/2023 1353   HGB 15.8 07/28/2022 0854   HCT 36.3 (L) 05/16/2023 1353   HCT 48.3 07/28/2022 0854   MCV 90.3 05/16/2023 1353   MCV 92 07/28/2022 0854   MCV 92 09/12/2012 1623      Recent Labs  Lab 05/16/23  1353  LIPASE 207*   No results for input(s): "AMMONIA" in the last 168 hours.     .lab  _______________________________________________ Hospitalist was called for admission for *** Acute pancreatitis, unspecified complication status, unspecified pancreatitis type ***    The following Work up has been ordered so far:  Orders Placed This Encounter  Procedures  . DG Chest 2 View  . CT ABDOMEN PELVIS W CONTRAST  . MR ABDOMEN MRCP W WO CONTAST  . CT Angio Chest PE W/Cm &/Or Wo Cm  . Comprehensive metabolic panel  . Lipase, blood  . CBC with Diff  . Urinalysis, Routine w reflex microscopic -Urine, Clean Catch  . Protime-INR  . Bilirubin, direct  . Diet NPO time specified  . Initiate Carrier Fluid Protocol  . Cardiac Monitoring Continuous x 24 hours Indications for use: Other; Other indications for use: pancreatitis  . Consult to gastroenterology  . Consult to hospitalist  . Insert peripheral IV  . Place in observation (patient's expected length of stay will be less than 2 midnights)     OTHER Significant initial  Findings:  labs showing:     DM  labs:  HbA1C: Recent Labs    07/28/22 0854 11/03/22 1421  HGBA1C 6.7* 6.8       CBG (last 3)  No results for input(s): "GLUCAP" in the last 72 hours.        Cultures:    Component Value Date/Time   SDES  07/20/2017 0950    URINE, RANDOM Performed at [facility], [address] Johnella Naas [place], [place] [address]    [facility]  07/20/2017 [phone]    Normal Performed at [facility], [address] [place], [place] [address]    CULT (A) 07/20/2017 0950    <10,000 COLONIES/mL INSIGNIFICANT GROWTH Performed at [facility], [address] [place], [place] [address]    REPTSTATUS 07/21/2017 FINAL 07/20/2017 0950     Radiological Exams on Admission: MR ABDOMEN MRCP W WO CONTAST Result Date: 05/16/2023 CLINICAL DATA:  Follow-up pancreatitis. EXAM: MRI ABDOMEN WITHOUT AND WITH CONTRAST (INCLUDING MRCP) TECHNIQUE: Multiplanar multisequence MR imaging of the abdomen was performed  both before and after the administration of intravenous contrast. Heavily T2-weighted images of the biliary and pancreatic ducts were obtained, and three-dimensional MRCP images were rendered by post processing. CONTRAST:  8mL GADAVIST GADOBUTROL 1 MMOL/ML IV SOLN COMPARISON:  CT scan, same date.  Prior MRI abdomen 05/03/2023 FINDINGS: Lower chest: The lung bases are clear of an acute process. No pleural or pericardial effusion. Hepatobiliary: Stable appearing moderate intrahepatic biliary dilatation. There is also persistent significant common bile duct dilatation in the porta hepatis. The common bile duct is markedly compressed and becomes quite narrow in the upper head of the pancreas. This is likely due to significant acute on chronic pancreatitis. No hepatic lesions are identified. The portal and hepatic veins are patent. The gallbladder is distended and contains layering high T1 material which is likely inspissated bile. There is also some layering sludge or small gallstones. Mild pericholecystic fluid likely related to the patient's pancreatitis. No common bile duct stones are identified. Pancreas: Significant inflammation/edema involving the head of the pancreas along with peripancreatic and periduodenal fluid. Persistent multiple intrapancreatic pseudocysts and extensive pancreatic calcifications, better seen on the CT scan. Marked stable dilatation of the main pancreatic duct. Stable moderate atrophy of the body and tail region of the pancreas. Spleen:  Normal size.  No focal lesions. Adrenals/Urinary Tract: The adrenal glands and  kidneys are unremarkable and stable. Stomach/Bowel: There is significant inflammation of the second and third portions of the duodenum surrounded by inflammatory phlegmon and fluid. Is moderate enhancement of the duodenal wall but no mechanical or functional obstruction. Stomach is unremarkable. The visualized small bowel colon are grossly normal. Vascular/Lymphatic: No  pathologically enlarged lymph nodes identified. No abdominal aortic aneurysm demonstrated. The portal and splenic veins are patent. The SMV is patent. Other: Small amount of fluid surrounding the pancreatic head and duodenum and also a small amount of fluid in the right anterior pararenal space but no overt ascites. Musculoskeletal: No significant bony findings. IMPRESSION: 1. Stable appearing moderate intrahepatic biliary dilatation and significant common bile duct dilatation in the porta hepatis. The common bile duct is markedly compressed and becomes quite narrow in the upper head of the pancreas. This is likely due to significant acute on chronic pancreatitis. Findings similar to prior MRI. 2. Distended gallbladder containing layering high T1 material which is likely inspissated bile. There is also some layering sludge or small gallstones. Mild pericholecystic fluid likely related to the patient's pancreatitis. No common bile duct stones are identified. 3. Significant inflammation/edema involving the head of the pancreas along with peripancreatic and periduodenal fluid. Persistent multiple intrapancreatic pseudocysts and extensive pancreatic calcifications, better seen on the CT scan. Marked stable dilatation of the main pancreatic duct. Stable moderate atrophy of the body and tail region of the pancreas. 4. Significant inflammation of the second and third portions of the duodenum surrounded by inflammatory phlegmon and fluid. No mechanical or functional obstruction. Electronically Signed   By: Marrian Siva M.D.   On: 05/16/2023 22:06   CT Angio Chest PE W/Cm &/Or Wo Cm Result Date: 05/16/2023 CLINICAL DATA:  Pulmonary embolism (PE) suspected, high prob. Hemoptysis. EXAM: CT ANGIOGRAPHY CHEST WITH CONTRAST TECHNIQUE: Multidetector CT imaging of the chest was performed using the standard protocol during bolus administration of intravenous contrast. Multiplanar CT image reconstructions and MIPs were obtained  to evaluate the vascular anatomy. RADIATION DOSE REDUCTION: This exam was performed according to the departmental dose-optimization program which includes automated exposure control, adjustment of the mA and/or kV according to patient size and/or use of iterative reconstruction technique. CONTRAST:  75mL OMNIPAQUE IOHEXOL 350 MG/ML SOLN COMPARISON:  10/15/2012 FINDINGS: Cardiovascular: No filling defects in the pulmonary arteries to suggest pulmonary emboli. Heart is normal size. Aorta is normal caliber. Mediastinum/Nodes: No mediastinal, hilar, or axillary adenopathy. Trachea and esophagus are unremarkable. Thyroid unremarkable. Lungs/Pleura: No confluent opacities or effusions. Upper Abdomen: See report from abdominal CT today. Musculoskeletal: Chest wall soft tissues are unremarkable. No acute bony abnormality. Review of the MIP images confirms the above findings. IMPRESSION: No evidence of pulmonary embolus. No acute cardiopulmonary disease. Electronically Signed   By: Janeece Mechanic M.D.   On: 05/16/2023 21:25   CT ABDOMEN PELVIS W CONTRAST Result Date: 05/16/2023 CLINICAL DATA:  Abdominal pain and known pancreatitis EXAM: CT ABDOMEN AND PELVIS WITH CONTRAST TECHNIQUE: Multidetector CT imaging of the abdomen and pelvis was performed using the standard protocol following bolus administration of intravenous contrast. RADIATION DOSE REDUCTION: This exam was performed according to the departmental dose-optimization program which includes automated exposure control, adjustment of the mA and/or kV according to patient size and/or use of iterative reconstruction technique. CONTRAST:  100mL OMNIPAQUE IOHEXOL 300 MG/ML  SOLN COMPARISON:  MRI from 05/03/2023, CT from 05/03/2023 FINDINGS: Lower chest: No acute abnormality. Hepatobiliary: Liver is well visualized with mild intra hepatic ductal dilatation similar to that seen on  the prior exam. Gallbladder is well distended without evidence of cholelithiasis. The common  bile duct is prominent as is the pancreatic duct secondary to changes of prior pancreatitis in the region of the pancreatic head. Pancreas: Pancreas demonstrates scattered calcifications consistent with chronic pancreatitis. Dilatation of the pancreatic duct is again seen. The head and uncinate process are enlarged but relatively stable from the prior exam. Slight increase in the degree of peripancreatic inflammatory changes noted consistent with an acute on chronic component. Spleen: Normal in size without focal abnormality. Adrenals/Urinary Tract: Adrenal glands are within normal limits. Kidneys demonstrate a normal enhancement pattern bilaterally. No renal calculi or obstructive changes are noted. The bladder is within normal limits. Stomach/Bowel: No obstructive or inflammatory changes of the colon are seen. The appendix is within normal limits. Small bowel and stomach are unremarkable with the exception of inflammatory changes in the second portion the duodenum related to the adjacent pancreatitis. These changes are similar in appearance. Vascular/Lymphatic: Aortic atherosclerosis. No enlarged abdominal or pelvic lymph nodes. Reproductive: Prostate is unremarkable. Other: No abdominal wall hernia or abnormality. No abdominopelvic ascites. Musculoskeletal: Right hip replacement is noted. Mild degenerative changes of lumbar spine are seen. IMPRESSION: Changes consistent with acute on chronic pancreatitis with slight increase in the degree of peripancreatic inflammatory change when compared with the prior exam. This causes compression upon the central pancreatic duct and common bile duct with dilatation of both peripherally. Ductal changes are stable in appearance. No other focal abnormality is noted. Electronically Signed   By: Violeta Grey M.D.   On: 05/16/2023 20:25   DG Chest 2 View Result Date: 05/16/2023 CLINICAL DATA:  Hemoptysis EXAM: CHEST - 2 VIEW COMPARISON:  Chest CT report only 10/20/2012 FINDINGS:  The heart size and mediastinal contours are within normal limits. Both lungs are clear. The visualized skeletal structures are unremarkable. IMPRESSION: No active cardiopulmonary disease. Electronically Signed   By: Tyron Gallon M.D.   On: 05/16/2023 16:06   _______________________________________________________________________________________________________ Latest  Blood pressure (!) 161/104, pulse (!) 57, temperature (!) 97.5 F (36.4 C), temperature source Oral, resp. rate 20, height 6\' 1"  (1.854 m), weight 77.2 kg, SpO2 100%.   Vitals  labs and radiology finding personally reviewed  Review of Systems:    Pertinent positives include: ***  Constitutional:  No weight loss, night sweats, Fevers, chills, fatigue, weight loss  HEENT:  No headaches, Difficulty swallowing,Tooth/dental problems,Sore throat,  No sneezing, itching, ear ache, nasal congestion, post nasal drip,  Cardio-vascular:  No chest pain, Orthopnea, PND, anasarca, dizziness, palpitations.no Bilateral lower extremity swelling  GI:  No heartburn, indigestion, abdominal pain, nausea, vomiting, diarrhea, change in bowel habits, loss of appetite, melena, blood in stool, hematemesis Resp:  no shortness of breath at rest. No dyspnea on exertion, No excess mucus, no productive cough, No non-productive cough, No coughing up of blood.No change in color of mucus.No wheezing. Skin:  no rash or lesions. No jaundice GU:  no dysuria, change in color of urine, no urgency or frequency. No straining to urinate.  No flank pain.  Musculoskeletal:  No joint pain or no joint swelling. No decreased range of motion. No back pain.  Psych:  No change in mood or affect. No depression or anxiety. No memory loss.  Neuro: no localizing neurological complaints, no tingling, no weakness, no double vision, no gait abnormality, no slurred speech, no confusion  All systems reviewed and apart from HOPI all are  negative _______________________________________________________________________________________________ Past Medical History:   Past Medical History:  Diagnosis Date  . Anemia   . Arthritis   . Avascular necrosis of femoral head, right (HCC) 2019  . Diet-controlled type 2 diabetes mellitus (HCC) 09/07/2021  . GERD (gastroesophageal reflux disease)   . Gout   . Hypertension   . Peptic ulcer disease       Past Surgical History:  Procedure Laterality Date  . COLONOSCOPY    . COLONOSCOPY WITH PROPOFOL N/A 11/30/2019   Procedure: COLONOSCOPY WITH PROPOFOL;  Surgeon: Selena Daily, MD;  Location: [facility];  Service: Endoscopy;  Laterality: N/A;  priority 4  . KNEE ARTHROSCOPY, MEDIAL PATELLO FEMORAL LIGAMENT REPAIR Bilateral 2004,2000  . POLYPECTOMY  11/30/2019   Procedure: POLYPECTOMY;  Surgeon: Selena Daily, MD;  Location: [facility];  Service: Endoscopy;;  . TOTAL HIP ARTHROPLASTY Right 08/02/2017   Procedure: TOTAL HIP ARTHROPLASTY;  Surgeon: Arlyne Lame, MD;  Location: [facility];  Service: Orthopedics;  Laterality: Right;    Social History:  Ambulatory *** independently cane, Markovic  wheelchair bound, bed bound     reports that he has never smoked. He has never used smokeless tobacco. He reports that he does not currently use alcohol. He reports current drug use. Drug: Marijuana.     Family History: *** Family History  Problem Relation Age of Onset  . Hypertension Mother   . Hypertension Father   . Colon cancer Father 31  . Diabetes Father   . Pancreatic cancer Neg Hx   . Esophageal cancer Neg Hx   . Liver cancer Neg Hx   . Stomach cancer Neg Hx    ______________________________________________________________________________________________ Allergies: No Known Allergies   Prior to Admission medications   Medication Sig Start Date End Date Taking? Authorizing Provider  amLODipine (NORVASC) 10 MG tablet TAKE 1 TABLET BY MOUTH  EVERY DAY 04/19/23  Yes Laneta Pintos, MD  atenolol (TENORMIN) 100 MG tablet TAKE 1 TABLET BY MOUTH EVERY DAY 04/19/23  Yes Olson, Daniel K, MD  colchicine 0.6 MG tablet Take 1 capsule by mouth daily as needed (gout pain) 06/26/20  Yes McDonough, Lauren K, PA-C  CREON 36000-114000 units CPEP capsule TAKE 2 CAPSULES (72,000 UNITS TOTAL) BY MOUTH WITH BREAKFAST, WITH LUNCH, AND WITH EVENING MEAL. 04/14/23  Yes Elois Hair, MD  diphenhydramine-acetaminophen (TYLENOL PM) 25-500 MG TABS tablet Take 2 tablets by mouth at bedtime as needed (Sleep/Pain).   Yes [provider]  fenofibrate (TRICOR) 48 MG tablet TAKE 1 TABLET BY MOUTH EVERY DAY 03/25/23  Yes Olson, Daniel K, MD  hydrocortisone cream 1 % Apply 1 application  topically daily as needed (rash).   Yes [provider]  lisinopril (ZESTRIL) 20 MG tablet Take 1 tablet (20 mg total) by mouth daily. 05/06/23  Yes Shalhoub, Merrill Abide, MD  ondansetron (ZOFRAN) 4 MG tablet Dissolve 1 tablet (4 mg total) by mouth daily as needed for nausea or vomiting. 05/06/23 05/05/24 Yes Shalhoub, Merrill Abide, MD  pantoprazole (PROTONIX) 40 MG tablet TAKE 1 TABLET (40 MG TOTAL) BY MOUTH DAILY TAKE 30 MINUTES BEFORE BREAKFAST Patient taking differently: Take 40 mg by mouth daily as needed (Indisgestion). 04/15/23  Yes Tory Freiberg, NP    ___________________________________________________________________________________________________ Physical Exam:    05/16/2023   10:50 PM 05/16/2023    5:21 PM 05/16/2023    1:27 PM  Vitals with BMI  Height 6\' 1"     Weight 170 lbs 5 oz    BMI 22.47    Systolic 161 159   Diastolic  104 97   Pulse 57 61 64     1. General:  in No ***Acute distress***increased work of breathing ***complaining of severe pain****agitated * Chronically ill *well *cachectic *toxic acutely ill -appearing 2. Psychological: Alert and *** Oriented 3. Head/ENT:   Moist *** Dry Mucous Membranes                          Head Non  traumatic, neck supple                          Normal *** Poor Dentition 4. SKIN: normal *** decreased Skin turgor,  Skin clean Dry and intact no rash    5. Heart: Regular rate and rhythm no*** Murmur, no Rub or gallop 6. Lungs: ***Clear to auscultation bilaterally, no wheezes or crackles   7. Abdomen: Soft, ***non-tender, Non distended *** obese ***bowel sounds present 8. Lower extremities: no clubbing, cyanosis, no ***edema 9. Neurologically Grossly intact, moving all 4 extremities equally *** strength 5 out of 5 in all 4 extremities cranial nerves II through XII intact 10. MSK: Normal range of motion    Chart has been reviewed  ______________________________________________________________________________________________  Assessment/Plan  ***  Admitted for *** Acute pancreatitis, unspecified complication status, unspecified pancreatitis type ***    Present on Admission: . Pancreatitis     No problem-specific Assessment & Plan notes found for this encounter.    Other plan as per orders.  DVT prophylaxis:  SCD *** Lovenox       Code Status:    Code Status: Prior FULL CODE *** DNR/DNI ***comfort care as per patient ***family  I had personally discussed CODE STATUS with patient and family*  ACP *** none has been reviewed ***   Family Communication:   Family not at  Bedside  plan of care was discussed on the phone with *** Son, Daughter, Wife, Husband, Sister, Brother , father, mother  Diet  Diet Orders (From admission, onward)     Start     Ordered   05/16/23 1318  Diet NPO time specified  Diet effective now        05/16/23 1318            Disposition Plan:   *** likely will need placement for rehabilitation                          Back to current facility when stable                            To home once workup is complete and patient is stable  ***Following barriers for discharge:                             Chest pain *** Stroke *** work up is  complete                            Electrolytes corrected                               Anemia corrected h/H stable  Pain controlled with PO medications                               Afebrile, white count improving able to transition to PO antibiotics                             Will need to be able to tolerate PO                            Will likely need home health, home O2, set up                           Will need consultants to evaluate patient prior to discharge       Consult Orders  (From admission, onward)           Start     Ordered   05/16/23 1830  Consult to hospitalist  Once       Provider:  (Not yet assigned)  Question Answer Comment  Place call to: [facility]   Reason for Consult Admit      05/16/23 1829                              ***Would benefit from PT/OT eval prior to DC  Ordered                   Swallow eval - SLP ordered                   Diabetes care coordinator                   Transition of care consulted                   Nutrition    consulted                  Wound care  consulted                   Palliative care    consulted                   Behavioral health  consulted                    Consults called: ***   Treatment Team:  Albertina Hugger, MD  Admission status:  ED Disposition     ED Disposition  Admit   Condition  --   Comment  Hospital [facility] [[id number]]  Level of Care: Telemetry [5]  Admit to tele based on following criteria: Other see comments  Comments: pancreatitis  May place patient in observation at [facility] or [facility] if equivalent level of care is available:: No  Covid Evaluation: Asymptomatic - no recent exposure (last 10 days) testing not required  Diagnosis: Pancreatitis [161096]  Admitting Physician: Latiesha Harada [[id number]]  Attending Physician: Suheyla Mortellaro [[id number]]           Obs***  ***  inpatient     I  Expect 2 midnight stay secondary to severity of patient's current illness need for inpatient interventions justified by the following: ***hemodynamic instability despite optimal treatment (tachycardia *hypotension * tachypnea *  hypoxia, hypercapnia) * Severe lab/radiological/exam abnormalities including:     and extensive comorbidities including: *substance abuse  *Chronic pain *DM2  * CHF * CAD  * COPD/asthma *Morbid Obesity * CKD *dementia *liver disease *history of stroke with residual deficits *  malignancy, * sickle cell disease  History of amputation Chronic anticoagulation  That are currently affecting medical management.   I expect  patient to be hospitalized for 2 midnights requiring inpatient medical care.  Patient is at high risk for adverse outcome (such as loss of life or disability) if not treated.  Indication for inpatient stay as follows:  Severe change from baseline regarding mental status Hemodynamic instability despite maximal medical therapy,  ongoing suicidal ideations,  severe pain requiring acute inpatient management,  inability to maintain oral hydration   persistent chest pain despite medical management Need for operative/procedural  intervention New or worsening hypoxia   Need for IV antibiotics, IV fluids, IV rate controling medications, IV antihypertensives, IV pain medications, IV anticoagulation, need for biPAP    Level of care   *** tele  For 12H 24H     medical floor       progressive     stepdown   tele indefinitely please discontinue once patient no longer qualifies COVID-19 Labs    Lab Results  Component Value Date   SARSCOV2NAA NEGATIVE 11/28/2019     Precautions: admitted as *** Covid Negative  ***asymptomatic screening protocol****PUI *** covid positive No active isolations ***If Covid PCR is negative  - please DC precautions - would need additional investigation given very high risk for false native test result     Critical***  Patient is critically ill due to  hemodynamic instability * respiratory failure *severe sepsis* ongoing chest pain*  They are at high risk for life/limb threatening clinical deterioration requiring frequent reassessment and modifications of care.  Services provided include examination of the patient, review of relevant ancillary tests, prescription of lifesaving therapies, review of medications and prophylactic therapy.  Total critical care time excluding separately billable procedures: 60*  Minutes.    Alyviah Crandle 05/16/2023, 11:44 PM ***  [facility]     after 2 AM please page floor coverage PA If 7AM-7PM, please contact the day team taking care of the patient using [email or web address]

## 2023-05-16 NOTE — ED Notes (Signed)
 Patient transported to X-ray

## 2023-05-17 ENCOUNTER — Inpatient Hospital Stay: Admitting: Family Medicine

## 2023-05-17 ENCOUNTER — Telehealth: Payer: Self-pay | Admitting: *Deleted

## 2023-05-17 DIAGNOSIS — R1012 Left upper quadrant pain: Secondary | ICD-10-CM

## 2023-05-17 DIAGNOSIS — Z6824 Body mass index (BMI) 24.0-24.9, adult: Secondary | ICD-10-CM | POA: Diagnosis not present

## 2023-05-17 DIAGNOSIS — K8689 Other specified diseases of pancreas: Secondary | ICD-10-CM

## 2023-05-17 DIAGNOSIS — Z860101 Personal history of adenomatous and serrated colon polyps: Secondary | ICD-10-CM | POA: Diagnosis not present

## 2023-05-17 DIAGNOSIS — R112 Nausea with vomiting, unspecified: Secondary | ICD-10-CM

## 2023-05-17 DIAGNOSIS — Z800 Family history of malignant neoplasm of digestive organs: Secondary | ICD-10-CM | POA: Diagnosis not present

## 2023-05-17 DIAGNOSIS — R042 Hemoptysis: Secondary | ICD-10-CM | POA: Diagnosis present

## 2023-05-17 DIAGNOSIS — I10 Essential (primary) hypertension: Secondary | ICD-10-CM | POA: Diagnosis present

## 2023-05-17 DIAGNOSIS — Z96641 Presence of right artificial hip joint: Secondary | ICD-10-CM | POA: Diagnosis present

## 2023-05-17 DIAGNOSIS — Z8249 Family history of ischemic heart disease and other diseases of the circulatory system: Secondary | ICD-10-CM | POA: Diagnosis not present

## 2023-05-17 DIAGNOSIS — E441 Mild protein-calorie malnutrition: Secondary | ICD-10-CM | POA: Diagnosis present

## 2023-05-17 DIAGNOSIS — K219 Gastro-esophageal reflux disease without esophagitis: Secondary | ICD-10-CM | POA: Diagnosis present

## 2023-05-17 DIAGNOSIS — Z79899 Other long term (current) drug therapy: Secondary | ICD-10-CM | POA: Diagnosis not present

## 2023-05-17 DIAGNOSIS — K831 Obstruction of bile duct: Secondary | ICD-10-CM | POA: Diagnosis present

## 2023-05-17 DIAGNOSIS — Z8711 Personal history of peptic ulcer disease: Secondary | ICD-10-CM | POA: Diagnosis not present

## 2023-05-17 DIAGNOSIS — M109 Gout, unspecified: Secondary | ICD-10-CM | POA: Diagnosis present

## 2023-05-17 DIAGNOSIS — Z833 Family history of diabetes mellitus: Secondary | ICD-10-CM | POA: Diagnosis not present

## 2023-05-17 DIAGNOSIS — D649 Anemia, unspecified: Secondary | ICD-10-CM

## 2023-05-17 DIAGNOSIS — E871 Hypo-osmolality and hyponatremia: Secondary | ICD-10-CM | POA: Diagnosis present

## 2023-05-17 DIAGNOSIS — K8500 Idiopathic acute pancreatitis without necrosis or infection: Secondary | ICD-10-CM | POA: Diagnosis not present

## 2023-05-17 DIAGNOSIS — K859 Acute pancreatitis without necrosis or infection, unspecified: Secondary | ICD-10-CM | POA: Diagnosis present

## 2023-05-17 DIAGNOSIS — R1084 Generalized abdominal pain: Secondary | ICD-10-CM

## 2023-05-17 DIAGNOSIS — R7989 Other specified abnormal findings of blood chemistry: Secondary | ICD-10-CM | POA: Diagnosis not present

## 2023-05-17 DIAGNOSIS — K2980 Duodenitis without bleeding: Secondary | ICD-10-CM | POA: Diagnosis present

## 2023-05-17 DIAGNOSIS — E1169 Type 2 diabetes mellitus with other specified complication: Secondary | ICD-10-CM | POA: Diagnosis present

## 2023-05-17 DIAGNOSIS — E876 Hypokalemia: Secondary | ICD-10-CM | POA: Diagnosis present

## 2023-05-17 DIAGNOSIS — E782 Mixed hyperlipidemia: Secondary | ICD-10-CM | POA: Diagnosis present

## 2023-05-17 DIAGNOSIS — K852 Alcohol induced acute pancreatitis without necrosis or infection: Secondary | ICD-10-CM | POA: Diagnosis not present

## 2023-05-17 DIAGNOSIS — F1011 Alcohol abuse, in remission: Secondary | ICD-10-CM | POA: Diagnosis present

## 2023-05-17 LAB — COMPREHENSIVE METABOLIC PANEL WITH GFR
ALT: 228 U/L — ABNORMAL HIGH (ref 0–44)
AST: 179 U/L — ABNORMAL HIGH (ref 15–41)
Albumin: 2.6 g/dL — ABNORMAL LOW (ref 3.5–5.0)
Alkaline Phosphatase: 445 U/L — ABNORMAL HIGH (ref 38–126)
Anion gap: 10 (ref 5–15)
BUN: 8 mg/dL (ref 6–20)
CO2: 24 mmol/L (ref 22–32)
Calcium: 9 mg/dL (ref 8.9–10.3)
Chloride: 101 mmol/L (ref 98–111)
Creatinine, Ser: 0.65 mg/dL (ref 0.61–1.24)
GFR, Estimated: >60 mL/min (ref >60)
Glucose, Bld: 134 mg/dL — ABNORMAL HIGH (ref 70–99)
Potassium: 4 mmol/L (ref 3.5–5.1)
Sodium: 135 mmol/L (ref 135–145)
Total Bilirubin: 11.4 mg/dL — ABNORMAL HIGH (ref 0.0–1.2)
Total Protein: 6.1 g/dL — ABNORMAL LOW (ref 6.5–8.1)

## 2023-05-17 LAB — BILIRUBIN, DIRECT: Bilirubin, Direct: 6.5 mg/dL — ABNORMAL HIGH (ref 0.0–0.2)

## 2023-05-17 LAB — CBC
HCT: 32.3 % — ABNORMAL LOW (ref 39.0–52.0)
Hemoglobin: 11 g/dL — ABNORMAL LOW (ref 13.0–17.0)
MCH: 30.5 pg (ref 26.0–34.0)
MCHC: 34.1 g/dL (ref 30.0–36.0)
MCV: 89.5 fL (ref 80.0–100.0)
Platelets: 353 10*3/uL (ref 150–400)
RBC: 3.61 MIL/uL — ABNORMAL LOW (ref 4.22–5.81)
RDW: 16.8 % — ABNORMAL HIGH (ref 11.5–15.5)
WBC: 4.7 10*3/uL (ref 4.0–10.5)
nRBC: 0 % (ref 0.0–0.2)

## 2023-05-17 LAB — GLUCOSE, CAPILLARY
Glucose-Capillary: 120 mg/dL — ABNORMAL HIGH (ref 70–99)
Glucose-Capillary: 128 mg/dL — ABNORMAL HIGH (ref 70–99)
Glucose-Capillary: 129 mg/dL — ABNORMAL HIGH (ref 70–99)
Glucose-Capillary: 131 mg/dL — ABNORMAL HIGH (ref 70–99)
Glucose-Capillary: 144 mg/dL — ABNORMAL HIGH (ref 70–99)
Glucose-Capillary: 187 mg/dL — ABNORMAL HIGH (ref 70–99)
Glucose-Capillary: 270 mg/dL — ABNORMAL HIGH (ref 70–99)

## 2023-05-17 LAB — PHOSPHORUS: Phosphorus: 2.8 mg/dL (ref 2.5–4.6)

## 2023-05-17 LAB — MAGNESIUM: Magnesium: 2.1 mg/dL (ref 1.7–2.4)

## 2023-05-17 MED ORDER — HYDRALAZINE HCL 25 MG PO TABS
25.0000 mg | ORAL_TABLET | Freq: Three times a day (TID) | ORAL | Status: DC
  Administered 2023-05-17 – 2023-05-18 (×3): 25 mg via ORAL
  Filled 2023-05-17 (×3): qty 1

## 2023-05-17 MED ORDER — PANTOPRAZOLE SODIUM 40 MG PO TBEC: 40.0000 mg | DELAYED_RELEASE_TABLET | Freq: Every day | ORAL | Status: DC | PRN

## 2023-05-17 MED ORDER — ISOSORBIDE DINITRATE 10 MG PO TABS
10.0000 mg | ORAL_TABLET | Freq: Three times a day (TID) | ORAL | Status: DC
  Administered 2023-05-17 – 2023-05-18 (×3): 10 mg via ORAL
  Filled 2023-05-17 (×3): qty 1

## 2023-05-17 MED ORDER — INSULIN ASPART 100 UNIT/ML IJ SOLN
0.0000 [IU] | INTRAMUSCULAR | Status: DC
  Administered 2023-05-17: 1 [IU] via SUBCUTANEOUS
  Administered 2023-05-17: 5 [IU] via SUBCUTANEOUS
  Administered 2023-05-17: 1 [IU] via SUBCUTANEOUS
  Administered 2023-05-17 – 2023-05-18 (×2): 2 [IU] via SUBCUTANEOUS
  Administered 2023-05-18 (×2): 1 [IU] via SUBCUTANEOUS
  Administered 2023-05-18: 2 [IU] via SUBCUTANEOUS
  Administered 2023-05-18 (×2): 1 [IU] via SUBCUTANEOUS
  Administered 2023-05-19: 2 [IU] via SUBCUTANEOUS
  Administered 2023-05-19 (×3): 1 [IU] via SUBCUTANEOUS

## 2023-05-17 MED ORDER — THIAMINE MONONITRATE 100 MG PO TABS
100.0000 mg | ORAL_TABLET | Freq: Every day | ORAL | Status: DC
  Administered 2023-05-17 – 2023-05-20 (×4): 100 mg via ORAL
  Filled 2023-05-17 (×4): qty 1

## 2023-05-17 MED ORDER — HYDROCODONE-ACETAMINOPHEN 5-325 MG PO TABS
1.0000 | ORAL_TABLET | ORAL | Status: DC | PRN
  Administered 2023-05-17 – 2023-05-20 (×3): 2 via ORAL
  Filled 2023-05-17 (×3): qty 2

## 2023-05-17 MED ORDER — ORAL CARE MOUTH RINSE: 15.0000 mL | OROMUCOSAL | Status: DC | PRN

## 2023-05-17 MED ORDER — ADULT MULTIVITAMIN W/MINERALS CH
1.0000 | ORAL_TABLET | Freq: Every day | ORAL | Status: DC
  Administered 2023-05-17 – 2023-05-20 (×4): 1 via ORAL
  Filled 2023-05-17 (×4): qty 1

## 2023-05-17 MED ORDER — ONDANSETRON HCL 4 MG/2ML IJ SOLN
4.0000 mg | Freq: Four times a day (QID) | INTRAMUSCULAR | Status: DC | PRN
  Administered 2023-05-18: 4 mg via INTRAVENOUS
  Filled 2023-05-17 (×2): qty 2

## 2023-05-17 MED ORDER — HYDROMORPHONE HCL 1 MG/ML IJ SOLN
0.5000 mg | INTRAMUSCULAR | Status: DC | PRN
  Administered 2023-05-17 – 2023-05-18 (×7): 1 mg via INTRAVENOUS
  Filled 2023-05-17 (×7): qty 1

## 2023-05-17 MED ORDER — ACETAMINOPHEN 325 MG PO TABS: 650.0000 mg | ORAL_TABLET | Freq: Four times a day (QID) | ORAL | Status: DC | PRN

## 2023-05-17 MED ORDER — SODIUM CHLORIDE 0.9 % IV SOLN: INTRAVENOUS | Status: DC

## 2023-05-17 MED ORDER — ONDANSETRON HCL 4 MG PO TABS: 4.0000 mg | ORAL_TABLET | Freq: Four times a day (QID) | ORAL | Status: DC | PRN

## 2023-05-17 MED ORDER — ACETAMINOPHEN 650 MG RE SUPP: 650.0000 mg | Freq: Four times a day (QID) | RECTAL | Status: DC | PRN

## 2023-05-17 MED ORDER — PANCRELIPASE (LIP-PROT-AMYL) 36000-114000 UNITS PO CPEP
36000.0000 [IU] | ORAL_CAPSULE | Freq: Three times a day (TID) | ORAL | Status: DC
  Administered 2023-05-17 – 2023-05-20 (×10): 36000 [IU] via ORAL
  Filled 2023-05-17 (×12): qty 1

## 2023-05-17 MED ORDER — AMLODIPINE BESYLATE 5 MG PO TABS
10.0000 mg | ORAL_TABLET | Freq: Every day | ORAL | Status: DC
  Administered 2023-05-17 – 2023-05-20 (×4): 10 mg via ORAL
  Filled 2023-05-17 (×4): qty 2

## 2023-05-17 MED ORDER — FOLIC ACID 1 MG PO TABS
1.0000 mg | ORAL_TABLET | Freq: Every day | ORAL | Status: DC
  Administered 2023-05-17 – 2023-05-20 (×4): 1 mg via ORAL
  Filled 2023-05-17 (×4): qty 1

## 2023-05-17 NOTE — Assessment & Plan Note (Signed)
 most likely cause being alcohol induced, appreciate [facility] consult  Will need to review imaging in AM and determine further paln of care Lipase     Component Value Date/Time   LIPASE 207 (H) 05/16/2023 1353   Lipid Panel     Component Value Date/Time   CHOL 190 07/28/2022 0854   TRIG 119 07/28/2022 0854   HDL 81 07/28/2022 0854   CHOLHDL 2.3 07/28/2022 0854   CHOLHDL 3.4 05/14/2021 0516   VLDL 31 05/14/2021 0516   LDLCALC 88 07/28/2022 0854   LABVLDL 21 07/28/2022 0854    Will rehydrate with aggressive IV fluids Keep clear Follow clinically     Control pain with IV pain medications   May need transfer to [facility] but first need [facility] consult

## 2023-05-17 NOTE — Assessment & Plan Note (Signed)
 Resume Norvasc at 10 mg a day

## 2023-05-17 NOTE — Assessment & Plan Note (Signed)
 Diet controlled sliding scale ordered

## 2023-05-17 NOTE — Telephone Encounter (Signed)
 This has been scheduled

## 2023-05-17 NOTE — TOC Initial Note (Signed)
 Transition of Care [facility]) - Initial/Assessment Note    Patient Details  Name: Evan Sullivan MRN: [id number] Date of Birth: [birth year]  Transition of Care [facility]) CM/SW Contact:    Ruben Corolla, RN Phone Number: 05/17/2023, 4:01 PM  Clinical Narrative:d/c plan home.                   Expected Discharge Plan: Home/Self Care Barriers to Discharge: Continued Medical Work up   Patient Goals and CMS Choice Patient states their goals for this hospitalization and ongoing recovery are:: Home CMS Medicare.gov Compare Post Acute Care list provided to:: Patient Choice offered to / list presented to : Patient [place] ownership interest in [facility].provided to:: Patient    Expected Discharge Plan and Services                                              Prior Living Arrangements/Services                       Activities of Daily Living   ADL Screening (condition at time of admission) Independently performs ADLs?: Yes (appropriate for developmental age) Is the patient deaf or have difficulty hearing?: No Does the patient have difficulty seeing, even when wearing glasses/contacts?: No Does the patient have difficulty concentrating, remembering, or making decisions?: No  Permission Sought/Granted                  Emotional Assessment              Admission diagnosis:  Pancreatitis [K85.90] Acute pancreatitis, unspecified complication status, unspecified pancreatitis type [K85.90] Patient Active Problem List   Diagnosis Date Noted   Pancreatitis 05/16/2023   Malnutrition of moderate degree 05/05/2023   Mixed diabetic hyperlipidemia associated with type 2 diabetes mellitus (HCC) 05/05/2023   Cholestasis 05/04/2023   Obstructive jaundice 05/04/2023   Acute pancreatitis after endoscopic retrograde cholangiopancreatography (ERCP) 05/03/2023   Unintentional weight loss 11/03/2022   Urinary tract infection with hematuria 07/09/2022    Gastroesophageal reflux disease with esophagitis 01/04/2022   Acute gout of right wrist 09/07/2021   Type 2 diabetes mellitus without complication, without long-term current use of insulin (HCC) 09/07/2021   Acute constipation 05/26/2021   Primary osteoarthritis of both knees 05/26/2021   Hospital discharge follow-up 05/20/2021   Iron deficiency anemia due to chronic blood loss 05/20/2021   Body mass index 29.0-29.9, adult 05/20/2021   Acute pancreatitis 05/14/2021   Acute on chronic pancreatitis (HCC) 05/13/2021   Marijuana abuse 05/13/2021   Alcohol abuse 05/13/2021   Hypokalemia 05/13/2021   History of gout 05/13/2021   HLD (hyperlipidemia) 05/13/2021   Hyperglycemia 05/13/2021   Encounter for screening colonoscopy 12/31/2018   Family history of colon cancer in father 12/31/2018   Dysuria 12/31/2018   GERD without esophagitis 09/15/2017   Status post total replacement of hip 08/02/2017   Avascular necrosis of bone of hip, right (HCC) 03/15/2017   Essential hypertension 03/15/2017   History of peptic ulcer disease 03/15/2017   Normocytic anemia 11/01/2015   SOB (shortness of breath) 11/01/2015   Syncope 11/01/2015   PCP:  Laneta Pintos, MD Pharmacy:   [facility] [address] [place] - [address] [place] [address] Phone: [phone] Fax: [phone]  [facility] [address] [place] [address] Phone: [phone] Fax: [phone]     Social Drivers of Health (SDOH) Social History: SDOH Screenings   Food Insecurity: No Food Insecurity (05/16/2023)  Housing: Low Risk  (05/16/2023)  Transportation Needs: No Transportation Needs (05/16/2023)  Utilities: Not At Risk (05/16/2023)  Alcohol Screen: Low Risk  (03/24/2021)  Depression (PHQ2-9): Low Risk  (11/03/2022)  Financial Resource Strain: Unknown (04/23/2017)   Received from [facility], [facility]  Physical  Activity: Unknown (04/23/2017)   Received from [facility], [facility]  Social Connections: Unknown (04/23/2017)   Received from [facility], [facility]  Stress: Unknown (04/23/2017)   Received from [facility], [facility]  Tobacco Use: Low Risk  (05/16/2023)   SDOH Interventions:     Readmission Risk Interventions    05/04/2023   12:50 PM  Readmission Risk Prevention Plan  Post Dischage Appt Complete  Medication Screening Complete  Transportation Screening Complete

## 2023-05-17 NOTE — Telephone Encounter (Signed)
 Copied from CRM [id number]. Topic: Appointments - Scheduling Inquiry for Clinic >> May 17, 2023 12:29 PM [employer] wrote: Reason for CRM: Patient needs hospital follow up. He will be released from the hospital tomorrow on 4/15.  Callback #: [phone]

## 2023-05-17 NOTE — Telephone Encounter (Signed)
 Pt is already rescheduled for the appt he missed due to being in hospital.

## 2023-05-17 NOTE — Telephone Encounter (Signed)
 Copied from CRM [id number]. Topic: Appointments - Scheduling Inquiry for Clinic >> May 17, 2023 10:03 AM Crispin Dolphin wrote: Reason for CRM: Patient called. Has appt today for [facility] F/u. May not be able to make it. Had to go back to hospital and stay overnight and has not been released yet. Will come if released by hospital or will call back to reschedule. Thank You

## 2023-05-17 NOTE — Assessment & Plan Note (Signed)
 In remission.

## 2023-05-17 NOTE — Progress Notes (Signed)
  Progress Note   Patient: Evan Sullivan WUJ:[id number] DOB: 1970/05/07 DOA: 05/16/2023     0 DOS: the patient was seen and examined on 05/17/2023   Assessment and Plan: Acute on chronic pancreatitis - GI soft diet  - Creon 36,000 units PO tid  - Norco q4 hr PRN  - IV dilaudid PRN    Alcohol abuse  - Thiamine/folic acid/MVI PO daily   HTN  - Norvasc 10 mg PO daily  - Hydralazine 25 mg PO q8hr  - Isordil 10 mg PO tid   GERD - Protonix 40 mg PO daily PRN   DM2 - Novolog SS q4 hr   Subjective: Pt seen and examined at the bedside. He tolerated breakfast this morning. Re-check lipase in AM. He is eager for discharge (possibly tmr if GI is not performing any procedures).  BP med's ordered due to persistent high reading --> Hydralazine/isordil   Physical Exam: Vitals:   05/16/23 2250 05/17/23 0412 05/17/23 0650 05/17/23 0914  BP: (!) 161/104 (!) 147/78 (!) 155/102 (!) 161/98  Pulse: (!) 57 (!) 57 (!) 58   Resp: 20 20 20    Temp: (!) 97.5 F (36.4 C) 98.9 F (37.2 C) 98.9 F (37.2 C)   TempSrc: Oral Oral Oral   SpO2: 100% 100% 100%   Weight: 77.2 kg     Height: 6\' 1"  (1.854 m)      Physical Exam HENT:     Head: Normocephalic.  Cardiovascular:     Rate and Rhythm: Bradycardia present.  Pulmonary:     Effort: Pulmonary effort is normal.  Abdominal:     Palpations: Abdomen is soft.  Skin:    General: Skin is warm.  Neurological:     Mental Status: He is alert and oriented to person, place, and time.  Psychiatric:        Mood and Affect: Mood normal.     Disposition: Status is: Inpatient Remains inpatient appropriate because: Serial labs and pain control   Planned Discharge Destination: Home    Time spent: 35 minutes  Author: Ica Daye , MD 05/17/2023 1:12 PM  For on call review [email or web address].

## 2023-05-17 NOTE — Assessment & Plan Note (Signed)
-   Resume Protonix ?

## 2023-05-17 NOTE — Progress Notes (Signed)
 Mobility Specialist - Progress Note  (RA) Pre-mobility: 55 bpm HR, 98% SpO2 During mobility: 75 bpm HR, 100% SpO2 Post-mobility: 71 bpm HR, 100% SPO2   05/17/23 1036  Mobility  Activity Ambulated independently in hallway  Level of Assistance Independent  Assistive Device None  Distance Ambulated (ft) 300 ft  Range of Motion/Exercises Active  Activity Response Tolerated well  Mobility Referral Yes  Mobility visit 1 Mobility  Mobility Specialist Start Time (ACUTE ONLY) 1026  Mobility Specialist Stop Time (ACUTE ONLY) 1036  Mobility Specialist Time Calculation (min) (ACUTE ONLY) 10 min   Pt was found in bed and agreeable to ambulate. No complaints with session. At EOS returned to bed with all needs met. Call bell in reach.  Lorna Rose Mobility Specialist

## 2023-05-17 NOTE — Assessment & Plan Note (Signed)
>>  ASSESSMENT AND PLAN FOR ACUTE ON CHRONIC PANCREATITIS (HCC) WRITTEN ON 05/17/2023 12:58 AM BY Selene Dais, MD  most likely cause being alcohol induced, appreciate [facility] consult  Will need to review imaging in AM and determine further paln of care Lipase     Component Value Date/Time   LIPASE 207 (H) 05/16/2023 1353   Lipid Panel     Component Value Date/Time   CHOL 190 07/28/2022 0854   TRIG 119 07/28/2022 0854   HDL 81 07/28/2022 0854   CHOLHDL 2.3 07/28/2022 0854   CHOLHDL 3.4 05/14/2021 0516   VLDL 31 05/14/2021 0516   LDLCALC 88 07/28/2022 0854   LABVLDL 21 07/28/2022 0854    Will rehydrate with aggressive IV fluids Keep clear Follow clinically     Control pain with IV pain medications   May need transfer to [facility] but first need [facility] consult

## 2023-05-17 NOTE — Consult Note (Addendum)
 Referring Provider: Dr. Therisa Doyne Primary Care Physician:  Sandre Kitty, MD Primary Gastroenterologist:  Dr. Tiajuana Amass   Reason for Consultation:  Abdominal pain, pancreatis   HPI: Evan Sullivan is a 53 y.o. male Evan Sullivan is a 53 y.o. male  Evan Sullivan is a 53 y.o. male with a past medical history of arthritis, hypertension, diabetes mellitus type 2, gout, GERD, H. pylori 07/2019 and chronic pancreatitis secondary to alcohol use disorder (abstinent x 1 year).  He was previously seen by Dr. Tomasa Rand in our outpatient GI clinic 03/25/18 5 for follow-up regarding chronic pancreatitis and he was subsequently referred to [facility] for EUS and possible ERCP.  He underwent an EUS 04/12/2023 by Dr. Edyth Gunnels at [facility] which showed sonographic changes consistent with severe chronic pancreatitis, 1 abnormal lymph node was visualized in the peripancreatic reason and FNA  was negative for malignancy and there was no evidence of significant pathology in the left lobe of the liver.   He was recently admitted to the hospital 05/03/2023 with worsening LUQ pain.  Admission labs showed a WBC count of 5.1.  Hemoglobin 15.1.  Hematocrit 44.7.  Platelets 337. Total bili 5.6.  Alk phos 265.  AST 157.  ALT 243.  Lipase 435.  Troponin 16 - 20. HIV negative. CTAP with contrast showed acute on chronic pancreatitis with similar small pseudocyst in the head and uncinate process of the pancreas. Abdominal MRI/MRCP 05/03/2023 showed severe stigmata of acute and chronic pancreatitis including multiple pseudocyst within the pancreatic head, diffuse atrophy of the distal pancreas with severe PD dilatation, severe intra/extrahepatic biliary ductal dilatation which has increased when compared to prior imaging and the CBD nearly effaced within the superior pancreatic head without evidence of choledocholithiasis. His T. Bili level continued to uptrend throughout his hospitalization thought to be secondary to surrounding pancreatic  edema resulting in external compression/obstruction of the CBD.  His abdominal pain continued to improve and his diet was slowly advanced. T. Bili 8.5 -> 8.8, direct bili 5.9, Alk phos 276 -> 344, AST 159 -> 440 and ALT 183 -> 346 and Lipase 93 at time of discharge 05/06/2023.  Follow-up labs done by his PCP 05/12/2023 showed a total bilirubin level of 12.7.  Direct bili 7.6.  Alk phos 380.  AST 51.  ALT 134.  He developed constipation following his hospital discharge, no BM x 6 days.  He contacted our office and he was instructed to take 2 Dulcolax tablets followed by 6 doses of MiraLAX which resulted in passing a moderate amount of solid and liquid stool around 4 PM.  Overall, he felt well and continued to have a BM most days without nausea, vomiting or significant any abdominal pain.  However, yesterday he developed abrupt L UQ pain after passing 2 solid bowel movements around 11 AM then coughed up a bit of phlegm with a small amount of dark red blood.He presented to the ED 05/16/2023 with worsening upper abdominal pain with N/V and he endorsed spitting up dark red blood when coughing x 1 episode.   Labs in the ED showed a WBC count of 5.1. Hg 12. 7 down from 14 (eleven days ago). HCT 36.3. PLT 415.  NA+ 133. K+ 3.5. Glu 126. BUN 6. Cr 0.41. Albumin 3.1. T. Bili 13. Alk phos 483. AST 235. ALT 277. Lipase 207.  INR 1.1.  Moderate urine bilirubin. Chest xray was negative. Chest CT negative for PE. Abdominal MRI/MRCP with and without contrast showed stable,  moderate intrahepatic biliary dilatation, significant CBD dilatation in the porta hepatis, CBD is markedly compressed and narrow in the upper head of the pancreas likely due to significant acute on chronic pancreatitis (similar when compared to prior MRI), distended gallbladder with layering sludge or stones with pericholecystic fluid, no evidence of choledocholithiasis, significant inflammation/edema involving the head of the with persistent multiple  intrapancreatic pseudocysts and extensive pancreatic calcifications, stable PD dilatation, atrophy of the body and tail and significant inflammation to the 2nd and 3rd portions of the duodenum without obstruction. A GI consult was requested for further management for acute on chronic pancreatitis.   Currently he feels well.  He tolerated clear liquid diet and ordered a soft diet for breakfast.  No abdominal pain.  No nausea or vomiting.  No further hemoptysis or spitting up blood.  He passed a formed yellowish stool earlier this morning.  No bloody or black stools.  He was adherent with taking Creon 3 times daily at home as prescribed.  No NSAID use.  He remains abstinent from alcohol for the past year.  He has a history of H. pylori gastritis identified per EGD 07/2021 treated with Pantoprazole/Pepto/Metronidazole/Doxycycline x 14 days. Posttreatment H. pylori stool antigen test was - 09/2021.    PRIOR IMAGE STUDIES:  Abdominal MRI/MRCP W/WO contrast 05/03/2023: 1. Severe stigmata of chronic pancreatitis, including multiple  pseudocysts within the pancreatic head, not significantly changed  compared to examination dated 02/18/2023, and diffuse atrophy of the  distal pancreas. Severe pancreatic ductal dilatation, duct measuring  up to 1.1 cm and, the main pancreatic duct effaced within the  pancreatic head.  2. Diffuse inflammatory fat stranding and fluid about the pancreas,  predominantly of the pancreatic head, and to a lesser extent the  adjacent retroperitoneum and small bowel mesentery. Findings are  consistent with acute on chronic pancreatitis.  3. Severe intra and extrahepatic biliary ductal dilatation, which is  somewhat increased compared to examination dated 02/18/2023. Common  bile duct is again nearly effaced within the superior pancreatic  head. No choledocholithiasis.  4. Similar, mild gallbladder wall thickening.  Aortic Atherosclerosis   CTAP with contrast  05/03/2023: FINDINGS: Lower chest: The visualized lung bases are clear.   No intra-abdominal free air.  Small free fluid in the pelvis.   Hepatobiliary: The liver is unremarkable. Mild biliary dilatation. The gallbladder is distended. No calcified gallstone.   Pancreas: There is inflammatory changes of the pancreas. Enlargement of the proximal pancreas predominantly involving the head and uncinate process with dilatation of the main pancreatic duct. Coarse calcifications of the proximal pancreas sequela of chronic pancreatitis. Similar appearance of small cystic structures in the head and uncinate process of the pancreas, likely pseudocysts.   Spleen: Normal in size without focal abnormality.   Adrenals/Urinary Tract: The adrenal glands are unremarkable. The kidneys, visualized ureters, and urinary bladder appear unremarkable.   Stomach/Bowel: There is no bowel obstruction or active inflammation. The appendix is normal.   Vascular/Lymphatic: Mild aortoiliac atherosclerotic disease. The IVC is unremarkable. No portal venous gas. There is compression of the main portal vein by the inflammatory changes of the pancreas. No adenopathy.   Reproductive: The prostate and seminal vesicles are grossly remarkable.   Other: Intramuscular lipoma of the left groin.   Musculoskeletal: Total right hip arthroplasty. No acute osseous pathology.   IMPRESSION: 1. Acute on chronic pancreatitis. Similar small pseudocyst in the uncinate process and head of the pancreas. 2. No bowel obstruction. Normal appendix.   GI PROCEDURES:  EUS 04/12/2023 at [facility]:  - There was no evidence of significant pathology in the left lobe of the liver.  - Endosonographic imaging of the pancreas showed sonographic changes consistent with severe chronic   pancreatitis.  - One abnormal lymph node was visualized in the  peripancreatic region. Fine needle biopsy performed.  - There was no sign of significant  pathology in the common bile duct.  - Normal major papilla.  Path report:  - Peripancreatic lymph node, fine-needle biopsy  - Blood and proteinaceous material with scattered macrophages, suggestive of cyst contents  - Superficial sampling of benign small bowel mucosa with foveolar metaplasia  - No dysplasia or malignancy is identified in this sampling    EGD 07/16/2021:  - Normal esophagus.  - Gastroesophageal flap valve classified as Hill Grade IV (no fold, wide open lumen, hiatal  hernia present).  - Gastritis with hemorrhage.  Localized severe inflammation with hemorrhage characterized by adherent blood, erythema  and shallow ulcerations was found in the gastric antrum. Biopsies were taken with a cold  forceps for Helicobacter pylori testing. Estimated blood loss was minimal.Biopsied. This is the likely source of the patient's iron deficiency  anemia.  - Duodenitis. - Localized severe inflammation characterized by congestion (edema), erythema and shallow  ulcerations was found in the duodenal bulb and in the second portion of the duodenum.  Biopsies were taken with a cold forceps for histology. Estimated blood loss was minimal.    Colonoscopy 07/16/2021:  - One 6 mm polyp in the ascending colon, removed with a cold snare. Resected and retrieved.  - Two 3 to 5 mm polyps in the descending colon, removed  retrieved.  - Diverticulosis in the sigmoid colon, in the transverse colon and in the ascending colon.  - Non-bleeding internal hemorrhoids.  - 7 year recall colonoscopy    1. Surgical [P], duodenal biopsies  ACUTE DUODENITIS  2. Surgical [P], gastric biopsies  CHRONIC ACTIVE GASTRITIS WITH SURFACE EROSION  HELICOBACTER PRESENT  NEGATIVE FOR INTESTINAL METAPLASIA, DYSPLASIA AND CARCINOMA  MINUTE DETACHED FRAGMENT OF REACTIVE SQUAMOUS MUCOSA WITH CHANGES CONSISTENT WITH REFLUX  ESOPHAGITIS (1 EOS/HIGH POWER FIELD)  3. Surgical [P], colon, ascending and descending, polyp (3)   TUBULAR ADENOMA  HYPERPLASTIC POLYP  NEGATIVE FOR HIGH-GRADE DYSPLASIA AND CARCINOMA    Colonoscopy 11/30/2019:  - One 15 mm polyp in the ascending colon, removed with a hot snare. Resected and retrieved. Clip (MR  conditional) was placed.  - Two 3 to 5 mm polyps in the rectum, removed with a cold snare. Resected and retrieved.  - The examination was otherwise normal.  - The distal rectum and anal verge are normal on retroflexion view.  A. COLON POLYP, ASCENDING; HOT SNARE:  - COLONIC MUCOSA WITH SUBMUCOSAL LIPOMA.  - NEGATIVE FOR DYSPLASIA AND MALIGNANCY.  B.  RECTUM POLYP; COLD SNARE:  - HYPERPLASTIC POLYP.  - NEGATIVE FOR DYSPLASIA AND MALIGNANCY.    Past Medical History:  Diagnosis Date   Anemia    Arthritis    Avascular necrosis of femoral head, right (HCC) 2019   Diet-controlled type 2 diabetes mellitus (HCC) 09/07/2021   GERD (gastroesophageal reflux disease)    Gout    Hypertension    Peptic ulcer disease     Past Surgical History:  Procedure Laterality Date   COLONOSCOPY     COLONOSCOPY WITH PROPOFOL N/A 11/30/2019   Procedure: COLONOSCOPY WITH PROPOFOL;  Surgeon: Selena Daily, MD;  Location: [facility];  Service: Endoscopy;  Laterality: N/A;  priority 4   KNEE ARTHROSCOPY, MEDIAL PATELLO FEMORAL LIGAMENT REPAIR Bilateral 2004,2000   POLYPECTOMY  11/30/2019   Procedure: POLYPECTOMY;  Surgeon: Toney Reil, MD;  Location: [facility];  Service: Endoscopy;;   TOTAL HIP ARTHROPLASTY Right 08/02/2017   Procedure: TOTAL HIP ARTHROPLASTY;  Surgeon: Donato Heinz, MD;  Location: [facility];  Service: Orthopedics;  Laterality: Right;    Prior to Admission medications   Medication Sig Start Date End Date Taking? Authorizing Provider  amLODipine (NORVASC) 10 MG tablet TAKE 1 TABLET BY MOUTH EVERY DAY 04/19/23  Yes Sandre Kitty, MD  atenolol (TENORMIN) 100 MG tablet TAKE 1 TABLET BY MOUTH EVERY DAY 04/19/23  Yes Sandre Kitty, MD  colchicine  0.6 MG tablet Take 1 capsule by mouth daily as needed (gout pain) 06/26/20  Yes McDonough, Lauren K, PA-C  CREON 36000-114000 units CPEP capsule TAKE 2 CAPSULES (72,000 UNITS TOTAL) BY MOUTH WITH BREAKFAST, WITH LUNCH, AND WITH EVENING MEAL. 04/14/23  Yes Jenel Lucks, MD  diphenhydramine-acetaminophen (TYLENOL PM) 25-500 MG TABS tablet Take 2 tablets by mouth at bedtime as needed (Sleep/Pain).   Yes [provider]  fenofibrate (TRICOR) 48 MG tablet TAKE 1 TABLET BY MOUTH EVERY DAY 03/25/23  Yes Sandre Kitty, MD  hydrocortisone cream 1 % Apply 1 application  topically daily as needed (rash).   Yes [provider]  lisinopril (ZESTRIL) 20 MG tablet Take 1 tablet (20 mg total) by mouth daily. 05/06/23  Yes Shalhoub, Deno Lunger, MD  ondansetron (ZOFRAN) 4 MG tablet Dissolve 1 tablet (4 mg total) by mouth daily as needed for nausea or vomiting. 05/06/23 05/05/24 Yes Shalhoub, Deno Lunger, MD  pantoprazole (PROTONIX) 40 MG tablet TAKE 1 TABLET (40 MG TOTAL) BY MOUTH DAILY TAKE 30 MINUTES BEFORE BREAKFAST Patient taking differently: Take 40 mg by mouth daily as needed (Indisgestion). 04/15/23  Yes Arnaldo Natal, NP    Current Facility-Administered Medications  Medication Dose Route Frequency Provider Last Rate Last Admin   0.9 %  sodium chloride infusion   Intravenous Continuous Therisa Doyne, MD 125 mL/hr at 05/17/23 0052 New Bag at 05/17/23 0052   acetaminophen (TYLENOL) tablet 650 mg  650 mg Oral Q6H PRN Therisa Doyne, MD       Or   acetaminophen (TYLENOL) suppository 650 mg  650 mg Rectal Q6H PRN Doutova, Anastassia, MD       amLODipine (NORVASC) tablet 10 mg  10 mg Oral Daily Doutova, Anastassia, MD       HYDROcodone-acetaminophen (NORCO/VICODIN) 5-325 MG per tablet 1-2 tablet  1-2 tablet Oral Q4H PRN Doutova, Anastassia, MD       HYDROmorphone (DILAUDID) injection 0.5-1 mg  0.5-1 mg Intravenous Q4H PRN Doutova, Anastassia, MD   1 mg at 05/17/23 0047   insulin  aspart (novoLOG) injection 0-9 Units  0-9 Units Subcutaneous Q4H Doutova, Anastassia, MD       lipase/protease/amylase (CREON) capsule 36,000 Units  36,000 Units Oral TID AC Doutova, Anastassia, MD       ondansetron (ZOFRAN) tablet 4 mg  4 mg Oral Q6H PRN Doutova, Anastassia, MD       Or   ondansetron (ZOFRAN) injection 4 mg  4 mg Intravenous Q6H PRN Doutova, Anastassia, MD       Oral care mouth rinse  15 mL Mouth Rinse PRN Doutova, Anastassia, MD       pantoprazole (PROTONIX) EC tablet 40 mg  40 mg Oral Daily PRN Therisa Doyne, MD  Allergies as of 05/16/2023   (No Known Allergies)    Family History  Problem Relation Age of Onset   Hypertension Mother    Hypertension Father    Colon cancer Father 70   Diabetes Father    Pancreatic cancer Neg Hx    Esophageal cancer Neg Hx    Liver cancer Neg Hx    Stomach cancer Neg Hx     Social History   Socioeconomic History   Marital status: Single    Spouse name: Not on file   Number of children: Not on file   Years of education: Not on file   Highest education level: Not on file  Occupational History   Not on file  Tobacco Use   Smoking status: Never   Smokeless tobacco: Never  Vaping Use   Vaping status: Never Used  Substance and Sexual Activity   Alcohol use: Not Currently   Drug use: Yes    Types: Marijuana    Comment: daily    Sexual activity: Yes    Birth control/protection: Condom  Other Topics Concern   Not on file  Social History Narrative   Not on file   Social Drivers of Health   Financial Resource Strain: Unknown (04/23/2017)   Received from [facility], [facility]   Overall Financial Resource Strain (CARDIA)    Difficulty of Paying Living Expenses: Patient declined  Food Insecurity: No Food Insecurity (05/16/2023)   Hunger Vital Sign    Worried About Running Out of Food in the Last Year: Never true    Ran Out of Food in the Last Year: Never true   Transportation Needs: No Transportation Needs (05/16/2023)   PRAPARE - Administrator, Civil Service (Medical): No    Lack of Transportation (Non-Medical): No  Physical Activity: Unknown (04/23/2017)   Received from [facility], [facility]   Exercise Vital Sign    Days of Exercise per Week: Patient declined    Minutes of Exercise per Session: Patient declined  Stress: Unknown (04/23/2017)   Received from [facility], [facility]   [employer] of Occupational Health - Occupational Stress Questionnaire    Feeling of Stress : Patient declined  Social Connections: Unknown (04/23/2017)   Received from [facility], [facility]   Social Connection and Isolation Panel [NHANES]    Frequency of Communication with Friends and Family: Patient declined    Frequency of Social Gatherings with Friends and Family: Patient declined    Attends Religious Services: Patient declined    Active Member of Clubs or Organizations: Patient declined    Attends Banker Meetings: Patient declined    Marital Status: Patient declined  Intimate Partner Violence: Not At Risk (05/16/2023)   Humiliation, Afraid, Rape, and Kick questionnaire    Fear of Current or Ex-Partner: No    Emotionally Abused: No    Physically Abused: No    Sexually Abused: No   Review of Systems: Gen: Denies fever, sweats or chills. No weight loss.  CV: Denies chest pain, palpitations or edema. Resp: Denies cough, shortness of breath of hemoptysis.  GI: See HPI. GU : Denies urinary burning, blood in urine, increased urinary frequency or incontinence. MS: Denies joint pain, muscles aches or weakness. Derm: Denies rash, itchiness, skin lesions or unhealing ulcers. Psych: Denies depression, anxiety, memory loss or confusion. Heme: Denies easy bruising, bleeding. Neuro: Denies headaches, dizziness  or  paresthesias. Endo:  DM type II. Physical Exam: Vital signs in last 24 hours: Temp:  [97.5 F (36.4 C)-98.9 F (37.2 C)] 98.9 F (37.2 C) (04/14 0650) Pulse Rate:  [57-64] 58 (04/14 0650) Resp:  [14-20] 20 (04/14 0650) BP: (147-164)/(78-104) 155/102 (04/14 0650) SpO2:  [100 %] 100 % (04/14 0650) Weight:  [77.2 kg-83 kg] 77.2 kg (04/13 2250) Last BM Date : 05/16/23 General: Alert 53 year old male in no acute distress. Head:  Normocephalic and atraumatic. Eyes: No scleral icterus.  Conjunctiva pink. Ears:  Normal auditory acuity. Nose:  No deformity, discharge or lesions. Mouth: Dentition intact. No ulcers or lesions.  Neck:  Supple. No lymphadenopathy or thyromegaly.  Lungs: Breath sounds clear throughout. No wheezes, rhonchi or crackles.  Heart: Regular rate and rhythm, no murmurs. Abdomen:, Nondistended.  Nontender.  Positive bowel sounds to all 4 quadrants. Rectal: Deferred. Musculoskeletal:  Symmetrical without gross deformities.  Pulses:  Normal pulses noted. Extremities:  Without clubbing or edema. Neurologic:  Alert and  oriented x 4. No focal deficits.  Skin:  Intact without significant lesions or rashes. Psych:  Alert and cooperative. Normal mood and affect.  Intake/Output from previous day: 04/13 0701 - 04/14 0700 In: 264.4 [I.V.:264.4] Out: -  Intake/Output this shift: No intake/output data recorded.  Lab Results: Recent Labs    05/16/23 1353 05/17/23 0506  WBC 5.1 4.7  HGB 12.7* 11.0*  HCT 36.3* 32.3*  PLT 415* 353   BMET Recent Labs    05/16/23 1353 05/17/23 0506  NA 133* 135  K 3.5 4.0  CL 101 101  CO2 23 24  GLUCOSE 126* 134*  BUN 6 8  CREATININE 0.41* 0.65  CALCIUM 9.1 9.0   LFT Recent Labs    05/17/23 0506  PROT 6.1*  ALBUMIN 2.6*  AST 179*  ALT 228*  ALKPHOS 445*  BILITOT 11.4*  BILIDIR 6.5*   PT/INR Recent Labs    05/16/23 1927  LABPROT 14.4  INR 1.1   Hepatitis Panel No results for input(s): "HEPBSAG", "HCVAB",  "HEPAIGM", "HEPBIGM" in the last 72 hours.    Studies/Results: MR ABDOMEN MRCP W WO CONTAST Result Date: 05/16/2023 CLINICAL DATA:  Follow-up pancreatitis. EXAM: MRI ABDOMEN WITHOUT AND WITH CONTRAST (INCLUDING MRCP) TECHNIQUE: Multiplanar multisequence MR imaging of the abdomen was performed both before and after the administration of intravenous contrast. Heavily T2-weighted images of the biliary and pancreatic ducts were obtained, and three-dimensional MRCP images were rendered by post processing. CONTRAST:  8mL GADAVIST GADOBUTROL 1 MMOL/ML IV SOLN COMPARISON:  CT scan, same date.  Prior MRI abdomen 05/03/2023 FINDINGS: Lower chest: The lung bases are clear of an acute process. No pleural or pericardial effusion. Hepatobiliary: Stable appearing moderate intrahepatic biliary dilatation. There is also persistent significant common bile duct dilatation in the porta hepatis. The common bile duct is markedly compressed and becomes quite narrow in the upper head of the pancreas. This is likely due to significant acute on chronic pancreatitis. No hepatic lesions are identified. The portal and hepatic veins are patent. The gallbladder is distended and contains layering high T1 material which is likely inspissated bile. There is also some layering sludge or small gallstones. Mild pericholecystic fluid likely related to the patient's pancreatitis. No common bile duct stones are identified. Pancreas: Significant inflammation/edema involving the head of the pancreas along with peripancreatic and periduodenal fluid. Persistent multiple intrapancreatic pseudocysts and extensive pancreatic calcifications, better seen on the CT scan. Marked stable dilatation of the main pancreatic duct. Stable moderate  atrophy of the body and tail region of the pancreas. Spleen:  Normal size.  No focal lesions. Adrenals/Urinary Tract: The adrenal glands and kidneys are unremarkable and stable. Stomach/Bowel: There is significant  inflammation of the second and third portions of the duodenum surrounded by inflammatory phlegmon and fluid. Is moderate enhancement of the duodenal wall but no mechanical or functional obstruction. Stomach is unremarkable. The visualized small bowel colon are grossly normal. Vascular/Lymphatic: No pathologically enlarged lymph nodes identified. No abdominal aortic aneurysm demonstrated. The portal and splenic veins are patent. The SMV is patent. Other: Small amount of fluid surrounding the pancreatic head and duodenum and also a small amount of fluid in the right anterior pararenal space but no overt ascites. Musculoskeletal: No significant bony findings. IMPRESSION: 1. Stable appearing moderate intrahepatic biliary dilatation and significant common bile duct dilatation in the porta hepatis. The common bile duct is markedly compressed and becomes quite narrow in the upper head of the pancreas. This is likely due to significant acute on chronic pancreatitis. Findings similar to prior MRI. 2. Distended gallbladder containing layering high T1 material which is likely inspissated bile. There is also some layering sludge or small gallstones. Mild pericholecystic fluid likely related to the patient's pancreatitis. No common bile duct stones are identified. 3. Significant inflammation/edema involving the head of the pancreas along with peripancreatic and periduodenal fluid. Persistent multiple intrapancreatic pseudocysts and extensive pancreatic calcifications, better seen on the CT scan. Marked stable dilatation of the main pancreatic duct. Stable moderate atrophy of the body and tail region of the pancreas. 4. Significant inflammation of the second and third portions of the duodenum surrounded by inflammatory phlegmon and fluid. No mechanical or functional obstruction. Electronically Signed   By: Rudie Meyer M.D.   On: 05/16/2023 22:06   CT Angio Chest PE W/Cm &/Or Wo Cm Result Date: 05/16/2023 CLINICAL DATA:   Pulmonary embolism (PE) suspected, high prob. Hemoptysis. EXAM: CT ANGIOGRAPHY CHEST WITH CONTRAST TECHNIQUE: Multidetector CT imaging of the chest was performed using the standard protocol during bolus administration of intravenous contrast. Multiplanar CT image reconstructions and MIPs were obtained to evaluate the vascular anatomy. RADIATION DOSE REDUCTION: This exam was performed according to the departmental dose-optimization program which includes automated exposure control, adjustment of the mA and/or kV according to patient size and/or use of iterative reconstruction technique. CONTRAST:  75mL OMNIPAQUE IOHEXOL 350 MG/ML SOLN COMPARISON:  10/15/2012 FINDINGS: Cardiovascular: No filling defects in the pulmonary arteries to suggest pulmonary emboli. Heart is normal size. Aorta is normal caliber. Mediastinum/Nodes: No mediastinal, hilar, or axillary adenopathy. Trachea and esophagus are unremarkable. Thyroid unremarkable. Lungs/Pleura: No confluent opacities or effusions. Upper Abdomen: See report from abdominal CT today. Musculoskeletal: Chest wall soft tissues are unremarkable. No acute bony abnormality. Review of the MIP images confirms the above findings. IMPRESSION: No evidence of pulmonary embolus. No acute cardiopulmonary disease. Electronically Signed   By: Charlett Nose M.D.   On: 05/16/2023 21:25   CT ABDOMEN PELVIS W CONTRAST Result Date: 05/16/2023 CLINICAL DATA:  Abdominal pain and known pancreatitis EXAM: CT ABDOMEN AND PELVIS WITH CONTRAST TECHNIQUE: Multidetector CT imaging of the abdomen and pelvis was performed using the standard protocol following bolus administration of intravenous contrast. RADIATION DOSE REDUCTION: This exam was performed according to the departmental dose-optimization program which includes automated exposure control, adjustment of the mA and/or kV according to patient size and/or use of iterative reconstruction technique. CONTRAST:  OMNIPAQUE IOHEXOL 300 MG/ML   SOLN COMPARISON:  MRI from 05/03/2023,  CT from 05/03/2023 FINDINGS: Lower chest: No acute abnormality. Hepatobiliary: Liver is well visualized with mild intra hepatic ductal dilatation similar to that seen on the prior exam. Gallbladder is well distended without evidence of cholelithiasis. The common bile duct is prominent as is the pancreatic duct secondary to changes of prior pancreatitis in the region of the pancreatic head. Pancreas: Pancreas demonstrates scattered calcifications consistent with chronic pancreatitis. Dilatation of the pancreatic duct is again seen. The head and uncinate process are enlarged but relatively stable from the prior exam. Slight increase in the degree of peripancreatic inflammatory changes noted consistent with an acute on chronic component. Spleen: Normal in size without focal abnormality. Adrenals/Urinary Tract: Adrenal glands are within normal limits. Kidneys demonstrate a normal enhancement pattern bilaterally. No renal calculi or obstructive changes are noted. The bladder is within normal limits. Stomach/Bowel: No obstructive or inflammatory changes of the colon are seen. The appendix is within normal limits. Small bowel and stomach are unremarkable with the exception of inflammatory changes in the second portion the duodenum related to the adjacent pancreatitis. These changes are similar in appearance. Vascular/Lymphatic: Aortic atherosclerosis. No enlarged abdominal or pelvic lymph nodes. Reproductive: Prostate is unremarkable. Other: No abdominal wall hernia or abnormality. No abdominopelvic ascites. Musculoskeletal: Right hip replacement is noted. Mild degenerative changes of lumbar spine are seen. IMPRESSION: Changes consistent with acute on chronic pancreatitis with slight increase in the degree of peripancreatic inflammatory change when compared with the prior exam. This causes compression upon the central pancreatic duct and common bile duct with dilatation of both  peripherally. Ductal changes are stable in appearance. No other focal abnormality is noted. Electronically Signed   By: Alcide Clever M.D.   On: 05/16/2023 20:25   DG Chest 2 View Result Date: 05/16/2023 CLINICAL DATA:  Hemoptysis EXAM: CHEST - 2 VIEW COMPARISON:  Chest CT report only 10/20/2012 FINDINGS: The heart size and mediastinal contours are within normal limits. Both lungs are clear. The visualized skeletal structures are unremarkable. IMPRESSION: No active cardiopulmonary disease. Electronically Signed   By: Darliss Cheney M.D.   On: 05/16/2023 16:06    IMPRESSION/PLAN:  53 year old male with acute on chronic pancreatitis. Prior EUS 04/12/2023 by Dr. Edyth Gunnels at [facility] showed severe chronic pancreatitis, 1 abnormal lymph node was visualized in the peripancreatic reason and FNA was negative for malignancy. Recently admitted 3/31 - 05/06/2023 with worsening pancreatitis and associated elevated T. Bil and LFTs treated with supportive care. Readmitted 05/16/2023 with recurrent LUQ pain and hemoptysis x 1 episode. T. Bili 13 -> 11.4. Alk phos 483 -> 445. AST 235 -> 179. ALT 277 -> 228. Lipase 207.  WBC 4.7. Abdominal MRI/MRCP showed stable, moderate intrahepatic biliary dilatation, significant CBD dilatation in the porta hepatis, CBD is markedly compressed and narrow in the upper head of the pancreas, distended gallbladder with layering sludge or stones with pericholecystic fluid, no evidence of choledocholithiasis, significant inflammation/edema involving the head of the with persistent multiple intrapancreatic pseudocysts and extensive pancreatic calcifications, stable PD dilatation, atrophy of the body and tail and significant inflammation to the 2nd and 3rd portions of the duodenum without obstruction.  Hemodynamically stable.  Afebrile. - IV fluids and pain management per the hospitalist - Soft diet as tolerated - Ondansetron 4 mg PR IV every 6 hours as needed - No plans for ERCP at this juncture, however,  if his total bili/direct bili and alk phos levels uptrend may consider ERCP for biliary stent placement for extrinsic CBC obstruction from severe pancreatitis/pancreatic edema -  Hepatic panel in a.m. - Await further recommendations per Dr. Brice Campi  Mild normocytic anemia. Hg 12.7 -> 11.0.  No overt GI bleeding.  Hemoptysis x 1 episode without further recurrence.  IV fluids/dilutional component likely a contributing factor. - CBC and iron panel in a.m.  Hemoptysis x 1 episode 4/13 without recurrence. CTA negative for PE. - Management per the medical service  History of colon polyps. Colonoscopy 07/2021 identified 1 tubular adenomatous and 1 hyperplastic polyp removed from the colon. -Next colon polyp surveillance colonoscopy due 07/2028   History of GERD. H. pylori gastritis per EGD 07/2021, treated with successful eradication. -Pantoprazole 40mg  po every day   Tory Freiberg  05/17/2023, 10:46 AM

## 2023-05-18 ENCOUNTER — Inpatient Hospital Stay (HOSPITAL_COMMUNITY)

## 2023-05-18 DIAGNOSIS — E441 Mild protein-calorie malnutrition: Secondary | ICD-10-CM

## 2023-05-18 DIAGNOSIS — R1114 Bilious vomiting: Secondary | ICD-10-CM

## 2023-05-18 DIAGNOSIS — E639 Nutritional deficiency, unspecified: Secondary | ICD-10-CM

## 2023-05-18 DIAGNOSIS — K2980 Duodenitis without bleeding: Secondary | ICD-10-CM

## 2023-05-18 DIAGNOSIS — K852 Alcohol induced acute pancreatitis without necrosis or infection: Secondary | ICD-10-CM | POA: Diagnosis not present

## 2023-05-18 LAB — CBC
HCT: 32.3 % — ABNORMAL LOW (ref 39.0–52.0)
Hemoglobin: 10.9 g/dL — ABNORMAL LOW (ref 13.0–17.0)
MCH: 30.9 pg (ref 26.0–34.0)
MCHC: 33.7 g/dL (ref 30.0–36.0)
MCV: 91.5 fL (ref 80.0–100.0)
Platelets: 346 10*3/uL (ref 150–400)
RBC: 3.53 MIL/uL — ABNORMAL LOW (ref 4.22–5.81)
RDW: 17.3 % — ABNORMAL HIGH (ref 11.5–15.5)
WBC: 4.3 10*3/uL (ref 4.0–10.5)
nRBC: 0 % (ref 0.0–0.2)

## 2023-05-18 LAB — GLUCOSE, CAPILLARY
Glucose-Capillary: 123 mg/dL — ABNORMAL HIGH (ref 70–99)
Glucose-Capillary: 128 mg/dL — ABNORMAL HIGH (ref 70–99)
Glucose-Capillary: 142 mg/dL — ABNORMAL HIGH (ref 70–99)
Glucose-Capillary: 144 mg/dL — ABNORMAL HIGH (ref 70–99)
Glucose-Capillary: 164 mg/dL — ABNORMAL HIGH (ref 70–99)
Glucose-Capillary: 185 mg/dL — ABNORMAL HIGH (ref 70–99)

## 2023-05-18 LAB — COMPREHENSIVE METABOLIC PANEL WITH GFR
ALT: 235 U/L — ABNORMAL HIGH (ref 0–44)
AST: 204 U/L — ABNORMAL HIGH (ref 15–41)
Albumin: 2.6 g/dL — ABNORMAL LOW (ref 3.5–5.0)
Alkaline Phosphatase: 463 U/L — ABNORMAL HIGH (ref 38–126)
Anion gap: 9 (ref 5–15)
BUN: 9 mg/dL (ref 6–20)
CO2: 23 mmol/L (ref 22–32)
Calcium: 9.1 mg/dL (ref 8.9–10.3)
Chloride: 100 mmol/L (ref 98–111)
Creatinine, Ser: 0.63 mg/dL (ref 0.61–1.24)
GFR, Estimated: >60 mL/min (ref >60)
Glucose, Bld: 133 mg/dL — ABNORMAL HIGH (ref 70–99)
Potassium: 3.8 mmol/L (ref 3.5–5.1)
Sodium: 132 mmol/L — ABNORMAL LOW (ref 135–145)
Total Bilirubin: 10.6 mg/dL — ABNORMAL HIGH (ref 0.0–1.2)
Total Protein: 5.9 g/dL — ABNORMAL LOW (ref 6.5–8.1)

## 2023-05-18 LAB — C-REACTIVE PROTEIN: CRP: 2.6 mg/dL — ABNORMAL HIGH (ref <1.0)

## 2023-05-18 LAB — IRON AND TIBC
Iron: 113 ug/dL (ref 45–182)
Saturation Ratios: 32 % (ref 17.9–39.5)
TIBC: 353 ug/dL (ref 250–450)
UIBC: 240 ug/dL

## 2023-05-18 LAB — MAGNESIUM: Magnesium: 2 mg/dL (ref 1.7–2.4)

## 2023-05-18 LAB — FERRITIN: Ferritin: 722 ng/mL — ABNORMAL HIGH (ref 24–336)

## 2023-05-18 LAB — HEMOGLOBIN A1C
Hgb A1c MFr Bld: 7.5 % — ABNORMAL HIGH (ref 4.8–5.6)
Mean Plasma Glucose: 169 mg/dL

## 2023-05-18 LAB — LIPASE, BLOOD: Lipase: 83 U/L — ABNORMAL HIGH (ref 11–51)

## 2023-05-18 LAB — PHOSPHORUS: Phosphorus: 3 mg/dL (ref 2.5–4.6)

## 2023-05-18 MED ORDER — PANTOPRAZOLE SODIUM 40 MG IV SOLR
40.0000 mg | Freq: Two times a day (BID) | INTRAVENOUS | Status: DC
  Administered 2023-05-18 – 2023-05-19 (×4): 40 mg via INTRAVENOUS
  Filled 2023-05-18 (×4): qty 10

## 2023-05-18 MED ORDER — HYDRALAZINE HCL 50 MG PO TABS
50.0000 mg | ORAL_TABLET | Freq: Three times a day (TID) | ORAL | Status: DC
  Administered 2023-05-18 – 2023-05-20 (×7): 50 mg via ORAL
  Filled 2023-05-18 (×7): qty 1

## 2023-05-18 MED ORDER — HYDROMORPHONE HCL 1 MG/ML IJ SOLN
2.0000 mg | INTRAMUSCULAR | Status: DC | PRN
  Administered 2023-05-18 – 2023-05-20 (×4): 2 mg via INTRAVENOUS
  Filled 2023-05-18 (×4): qty 2

## 2023-05-18 MED ORDER — ISOSORBIDE DINITRATE 20 MG PO TABS
20.0000 mg | ORAL_TABLET | Freq: Three times a day (TID) | ORAL | Status: DC
  Administered 2023-05-18 – 2023-05-20 (×6): 20 mg via ORAL
  Filled 2023-05-18: qty 1
  Filled 2023-05-18 (×5): qty 2

## 2023-05-18 NOTE — Progress Notes (Signed)
 Gastroenterology Inpatient Follow-up Note   PATIENT IDENTIFICATION  Evan Sullivan is a 53 y.o. male Hospital Day: 3  SUBJECTIVE  The patient is seen in follow-up. Patient had eventful morning with significant vomiting and progressive abdominal pain. Laboratories reviewed.  Slight decrease in liver biochemical pattern. By this afternoon, patient is feeling better and would like to see if he can restart liquids. He denies fevers or chills. He would like to get home sooner rather than later.  OBJECTIVE  Scheduled Inpatient Medications:   amLODipine  10 mg Oral Daily   folic acid  1 mg Oral Daily   hydrALAZINE  50 mg Oral Q8H   insulin aspart  0-9 Units Subcutaneous Q4H   isosorbide dinitrate  20 mg Oral TID   lipase/protease/amylase  36,000 Units Oral TID AC   multivitamin with minerals  1 tablet Oral Daily   pantoprazole (PROTONIX) IV  40 mg Intravenous Q12H   thiamine  100 mg Oral Daily   Continuous Inpatient Infusions:  PRN Inpatient Medications: acetaminophen **OR** acetaminophen, HYDROcodone-acetaminophen, HYDROmorphone (DILAUDID) injection, ondansetron **OR** ondansetron (ZOFRAN) IV, mouth rinse   Physical Examination  Temp:  [97.6 F (36.4 C)-98.7 F (37.1 C)] 97.8 F (36.6 C) (04/15 1220) Pulse Rate:  [58-78] 78 (04/15 1220) Resp:  [16-18] 16 (04/15 1220) BP: (141-154)/(85-96) 147/94 (04/15 1220) SpO2:  [100 %] 100 % (04/15 1220) Temp (24hrs), Avg:98.1 F (36.7 C), Min:97.6 F (36.4 C), Max:98.7 F (37.1 C)  Weight: 77.2 kg GEN: NAD, appears stated age, appears chronically ill but is nontoxic PSYCH: Cooperative, without pressured speech EYE: Scleral icterus noted ENT: MMM CV: Nontachycardic RESP: No audible wheezing GI: NABS, soft, TTP in MEG, no guarding or rebound  MSK/EXT: No significant lower extremity edema NEURO:  Alert & Oriented x 3, no focal deficits   Review of Data   Laboratory Studies   Recent Labs  Lab 05/17/23 0506 05/18/23 0458  NA  135 132*  K 4.0 3.8  CL 101 100  CO2 24 23  BUN 8 9  CREATININE 0.65 0.63  GLUCOSE 134* 133*  CALCIUM 9.0 9.1  MG 2.1 2.0  PHOS 2.8 3.0   Recent Labs  Lab 05/18/23 0458  AST 204*  ALT 235*  ALKPHOS 463*    Recent Labs  Lab 05/16/23 1353 05/17/23 0506 05/18/23 0458  WBC 5.1 4.7 4.3  HGB 12.7* 11.0* 10.9*  HCT 36.3* 32.3* 32.3*  PLT 415* 353 346   Recent Labs  Lab 05/16/23 1927  INR 1.1   Imaging Studies  MR ABDOMEN MRCP W WO CONTAST Result Date: 05/16/2023 CLINICAL DATA:  Follow-up pancreatitis. EXAM: MRI ABDOMEN WITHOUT AND WITH CONTRAST (INCLUDING MRCP) TECHNIQUE: Multiplanar multisequence MR imaging of the abdomen was performed both before and after the administration of intravenous contrast. Heavily T2-weighted images of the biliary and pancreatic ducts were obtained, and three-dimensional MRCP images were rendered by post processing. CONTRAST:  8mL GADAVIST GADOBUTROL 1 MMOL/ML IV SOLN COMPARISON:  CT scan, same date.  Prior MRI abdomen 05/03/2023 FINDINGS: Lower chest: The lung bases are clear of an acute process. No pleural or pericardial effusion. Hepatobiliary: Stable appearing moderate intrahepatic biliary dilatation. There is also persistent significant common bile duct dilatation in the porta hepatis. The common bile duct is markedly compressed and becomes quite narrow in the upper head of the pancreas. This is likely due to significant acute on chronic pancreatitis. No hepatic lesions are identified. The portal and hepatic veins are patent. The gallbladder is distended and  contains layering high T1 material which is likely inspissated bile. There is also some layering sludge or small gallstones. Mild pericholecystic fluid likely related to the patient's pancreatitis. No common bile duct stones are identified. Pancreas: Significant inflammation/edema involving the head of the pancreas along with peripancreatic and periduodenal fluid. Persistent multiple intrapancreatic  pseudocysts and extensive pancreatic calcifications, better seen on the CT scan. Marked stable dilatation of the main pancreatic duct. Stable moderate atrophy of the body and tail region of the pancreas. Spleen:  Normal size.  No focal lesions. Adrenals/Urinary Tract: The adrenal glands and kidneys are unremarkable and stable. Stomach/Bowel: There is significant inflammation of the second and third portions of the duodenum surrounded by inflammatory phlegmon and fluid. Is moderate enhancement of the duodenal wall but no mechanical or functional obstruction. Stomach is unremarkable. The visualized small bowel colon are grossly normal. Vascular/Lymphatic: No pathologically enlarged lymph nodes identified. No abdominal aortic aneurysm demonstrated. The portal and splenic veins are patent. The SMV is patent. Other: Small amount of fluid surrounding the pancreatic head and duodenum and also a small amount of fluid in the right anterior pararenal space but no overt ascites. Musculoskeletal: No significant bony findings. IMPRESSION: 1. Stable appearing moderate intrahepatic biliary dilatation and significant common bile duct dilatation in the porta hepatis. The common bile duct is markedly compressed and becomes quite narrow in the upper head of the pancreas. This is likely due to significant acute on chronic pancreatitis. Findings similar to prior MRI. 2. Distended gallbladder containing layering high T1 material which is likely inspissated bile. There is also some layering sludge or small gallstones. Mild pericholecystic fluid likely related to the patient's pancreatitis. No common bile duct stones are identified. 3. Significant inflammation/edema involving the head of the pancreas along with peripancreatic and periduodenal fluid. Persistent multiple intrapancreatic pseudocysts and extensive pancreatic calcifications, better seen on the CT scan. Marked stable dilatation of the main pancreatic duct. Stable moderate  atrophy of the body and tail region of the pancreas. 4. Significant inflammation of the second and third portions of the duodenum surrounded by inflammatory phlegmon and fluid. No mechanical or functional obstruction. Electronically Signed   By: Marrian Siva M.D.   On: 05/16/2023 22:06   CT Angio Chest PE W/Cm &/Or Wo Cm Result Date: 05/16/2023 CLINICAL DATA:  Pulmonary embolism (PE) suspected, high prob. Hemoptysis. EXAM: CT ANGIOGRAPHY CHEST WITH CONTRAST TECHNIQUE: Multidetector CT imaging of the chest was performed using the standard protocol during bolus administration of intravenous contrast. Multiplanar CT image reconstructions and MIPs were obtained to evaluate the vascular anatomy. RADIATION DOSE REDUCTION: This exam was performed according to the departmental dose-optimization program which includes automated exposure control, adjustment of the mA and/or kV according to patient size and/or use of iterative reconstruction technique. CONTRAST:  75mL OMNIPAQUE IOHEXOL 350 MG/ML SOLN COMPARISON:  10/15/2012 FINDINGS: Cardiovascular: No filling defects in the pulmonary arteries to suggest pulmonary emboli. Heart is normal size. Aorta is normal caliber. Mediastinum/Nodes: No mediastinal, hilar, or axillary adenopathy. Trachea and esophagus are unremarkable. Thyroid unremarkable. Lungs/Pleura: No confluent opacities or effusions. Upper Abdomen: See report from abdominal CT today. Musculoskeletal: Chest wall soft tissues are unremarkable. No acute bony abnormality. Review of the MIP images confirms the above findings. IMPRESSION: No evidence of pulmonary embolus. No acute cardiopulmonary disease. Electronically Signed   By: Janeece Mechanic M.D.   On: 05/16/2023 21:25   CT ABDOMEN PELVIS W CONTRAST Result Date: 05/16/2023 CLINICAL DATA:  Abdominal pain and known pancreatitis EXAM: CT ABDOMEN  AND PELVIS WITH CONTRAST TECHNIQUE: Multidetector CT imaging of the abdomen and pelvis was performed using the standard  protocol following bolus administration of intravenous contrast. RADIATION DOSE REDUCTION: This exam was performed according to the departmental dose-optimization program which includes automated exposure control, adjustment of the mA and/or kV according to patient size and/or use of iterative reconstruction technique. CONTRAST:  100mL OMNIPAQUE IOHEXOL 300 MG/ML  SOLN COMPARISON:  MRI from 05/03/2023, CT from 05/03/2023 FINDINGS: Lower chest: No acute abnormality. Hepatobiliary: Liver is well visualized with mild intra hepatic ductal dilatation similar to that seen on the prior exam. Gallbladder is well distended without evidence of cholelithiasis. The common bile duct is prominent as is the pancreatic duct secondary to changes of prior pancreatitis in the region of the pancreatic head. Pancreas: Pancreas demonstrates scattered calcifications consistent with chronic pancreatitis. Dilatation of the pancreatic duct is again seen. The head and uncinate process are enlarged but relatively stable from the prior exam. Slight increase in the degree of peripancreatic inflammatory changes noted consistent with an acute on chronic component. Spleen: Normal in size without focal abnormality. Adrenals/Urinary Tract: Adrenal glands are within normal limits. Kidneys demonstrate a normal enhancement pattern bilaterally. No renal calculi or obstructive changes are noted. The bladder is within normal limits. Stomach/Bowel: No obstructive or inflammatory changes of the colon are seen. The appendix is within normal limits. Small bowel and stomach are unremarkable with the exception of inflammatory changes in the second portion the duodenum related to the adjacent pancreatitis. These changes are similar in appearance. Vascular/Lymphatic: Aortic atherosclerosis. No enlarged abdominal or pelvic lymph nodes. Reproductive: Prostate is unremarkable. Other: No abdominal wall hernia or abnormality. No abdominopelvic ascites. Musculoskeletal:  Right hip replacement is noted. Mild degenerative changes of lumbar spine are seen. IMPRESSION: Changes consistent with acute on chronic pancreatitis with slight increase in the degree of peripancreatic inflammatory change when compared with the prior exam. This causes compression upon the central pancreatic duct and common bile duct with dilatation of both peripherally. Ductal changes are stable in appearance. No other focal abnormality is noted. Electronically Signed   By: Violeta Grey M.D.   On: 05/16/2023 20:25   GI Procedures and Studies  No new relevant studies to review   ASSESSMENT  Evan Sullivan is a 53 y.o. male with a pmh significant for hypertension, diabetes, arthritis, gout, H. 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  Patient admitted to the hospital with recurrent and progressive acute on chronic pancreatitis with concern for progressive biliary obstruction.  Patient is hemodynamically stable this afternoon.  However, the reason for his symptomatology is most likely as a result of the significant duodenitis that is present on imaging as result of the phlegmon and fluid collections are developing/evolving in the retroperitoneum.  Liver biochemical testing is overall stable to slightly improved.  He is very hesitant about ERCP for biliary stent placement, as a result of the risk for superimposed pancreatitis.  I am not unwilling for the patient to trial liquids again today, but I suspect that he will likely have failure in the next 48 hours.  If he does fail, then the neck step would be Dobbhoff placement via IR post second portion of duodenum versus core track placement post second portion duodenum versus consideration of IV TPN nutrition.  Patient is hesitant about this.  He would like to leave the hospital soon as possible.  Certainly will let the patient decide  when he is ready to go, but I am concerned  he will have issues.  Time will tell.  All patient questions were answered to the best of my ability, and the patient agrees to the aforementioned plan of action with follow-up as indicated.   PLAN/RECOMMENDATIONS  May try to advance diet as tolerated clear liquid diet to full liquid diet tomorrow - If not advancing within the next 36 hours, strongly consider Dobbhoff versus core track versus IV TPN nutrition Maintenance supportive fluids to be considered if not tolerating clear liquid diet Antiemetics as ordered Pain management as per medical service Patient hesitant about ERCP for biliary stent placement due to risk of superimposed pancreatitis - If this progresses or does not further improve, he may need this Eventual follow-up with [facility]   Please page/call with questions or concerns.   Yong Henle, MD [facility] Office # [phone]    LOS: 1 day  Evan Sullivan  05/18/2023, 5:08 PM

## 2023-05-18 NOTE — Progress Notes (Addendum)
  Progress Note   Patient: Evan Sullivan:[id number] DOB: [birth year] DOA: 05/16/2023     1 DOS: the patient was seen and examined on 05/18/2023    Assessment and Plan: Acute on chronic pancreatitis - GI soft diet  - Creon 36,000 units PO tid  - Norco q4 hr PRN  - IV dilaudid PRN    Alcohol abuse  - Thiamine/folic acid/MVI PO daily    HTN  - Norvasc 10 mg PO daily  - Hydralazine 50 mg PO q8hr  - Isordil 20 mg PO tid   GERD - Protonix 40 mg PO daily PRN    DM2 - Novolog SS q4 hr   6. Hyponatremia Not Clinically Significant  Subjective: Pt seen and examined at the bedside. Today pt with vomiting and increasing abd pain. Spoke with the GI NP at the bedside and she advised they will discuss amongst the GI team the next steps forward for this pt.Hydralazine and isordil titrated up for better BP control. Continue with pain management.  Physical Exam: Vitals:   05/18/23 0408 05/18/23 0535 05/18/23 1049 05/18/23 1220  BP: (!) 153/96 (!) 147/94 (!) 141/85 (!) 147/94  Pulse: 60 (!) 58 68 78  Resp: 18  16 16   Temp: 98.2 F (36.8 C)  97.6 F (36.4 C) 97.8 F (36.6 C)  TempSrc: Oral  Oral Oral  SpO2: 100%  100% 100%  Weight:      Height:       Physical Exam HENT:     Head: Normocephalic.  Cardiovascular:     Rate and Rhythm: Bradycardia(intermittent) present.  Pulmonary:     Effort: Pulmonary effort is normal.  Abdominal:     Palpations: Abdomen is soft.  Skin:    General: Skin is warm.  Neurological:     Mental Status: He is alert and oriented to person, place, and time.  Psychiatric:        Mood and Affect: Mood normal.    Disposition: Status is: Inpatient Remains inpatient appropriate because: IV pain control and intervention by GI   Planned Discharge Destination: Barriers to discharge: IV pain control required in the hospital    Time spent: 35 minutes  Author: Alylah Blakney , MD 05/18/2023 1:24 PM  For on call review [email or web address].

## 2023-05-18 NOTE — Progress Notes (Signed)
 Mobility Specialist - Progress Note   05/18/23 1141  Mobility  Activity Ambulated independently in hallway  Level of Assistance Independent  Assistive Device None  Distance Ambulated (ft) 250 ft  Activity Response Tolerated well  Mobility Referral Yes  Mobility visit 1 Mobility  Mobility Specialist Start Time (ACUTE ONLY) 1136  Mobility Specialist Stop Time (ACUTE ONLY) 1141  Mobility Specialist Time Calculation (min) (ACUTE ONLY) 5 min   Pt received in bed and agreeable to mobility. No complaints during session. Pt to bathroom after session with all needs met.    [facility]

## 2023-05-19 DIAGNOSIS — K2980 Duodenitis without bleeding: Secondary | ICD-10-CM

## 2023-05-19 DIAGNOSIS — Z8719 Personal history of other diseases of the digestive system: Secondary | ICD-10-CM

## 2023-05-19 DIAGNOSIS — E441 Mild protein-calorie malnutrition: Secondary | ICD-10-CM

## 2023-05-19 DIAGNOSIS — K8689 Other specified diseases of pancreas: Secondary | ICD-10-CM

## 2023-05-19 DIAGNOSIS — K8500 Idiopathic acute pancreatitis without necrosis or infection: Secondary | ICD-10-CM | POA: Diagnosis not present

## 2023-05-19 LAB — LIPASE, BLOOD: Lipase: 49 U/L (ref 11–51)

## 2023-05-19 LAB — CBC
HCT: 32.4 % — ABNORMAL LOW (ref 39.0–52.0)
Hemoglobin: 11.1 g/dL — ABNORMAL LOW (ref 13.0–17.0)
MCH: 30.6 pg (ref 26.0–34.0)
MCHC: 34.3 g/dL (ref 30.0–36.0)
MCV: 89.3 fL (ref 80.0–100.0)
Platelets: 363 10*3/uL (ref 150–400)
RBC: 3.63 MIL/uL — ABNORMAL LOW (ref 4.22–5.81)
RDW: 17.5 % — ABNORMAL HIGH (ref 11.5–15.5)
WBC: 5.9 10*3/uL (ref 4.0–10.5)
nRBC: 0 % (ref 0.0–0.2)

## 2023-05-19 LAB — GLUCOSE, CAPILLARY
Glucose-Capillary: 138 mg/dL — ABNORMAL HIGH (ref 70–99)
Glucose-Capillary: 124 mg/dL — ABNORMAL HIGH (ref 70–99)
Glucose-Capillary: 183 mg/dL — ABNORMAL HIGH (ref 70–99)
Glucose-Capillary: 139 mg/dL — ABNORMAL HIGH (ref 70–99)
Glucose-Capillary: 156 mg/dL — ABNORMAL HIGH (ref 70–99)

## 2023-05-19 LAB — C-REACTIVE PROTEIN: CRP: 10.4 mg/dL — ABNORMAL HIGH (ref <1.0)

## 2023-05-19 LAB — COMPREHENSIVE METABOLIC PANEL WITH GFR
ALT: 210 U/L — ABNORMAL HIGH (ref 0–44)
AST: 143 U/L — ABNORMAL HIGH (ref 15–41)
Albumin: 2.7 g/dL — ABNORMAL LOW (ref 3.5–5.0)
Alkaline Phosphatase: 473 U/L — ABNORMAL HIGH (ref 38–126)
Anion gap: 11 (ref 5–15)
BUN: 12 mg/dL (ref 6–20)
CO2: 23 mmol/L (ref 22–32)
Calcium: 9.4 mg/dL (ref 8.9–10.3)
Chloride: 100 mmol/L (ref 98–111)
Creatinine, Ser: 0.83 mg/dL (ref 0.61–1.24)
GFR, Estimated: >60 mL/min (ref >60)
Glucose, Bld: 114 mg/dL — ABNORMAL HIGH (ref 70–99)
Potassium: 3.7 mmol/L (ref 3.5–5.1)
Sodium: 134 mmol/L — ABNORMAL LOW (ref 135–145)
Total Bilirubin: 10.4 mg/dL — ABNORMAL HIGH (ref 0.0–1.2)
Total Protein: 6.2 g/dL — ABNORMAL LOW (ref 6.5–8.1)

## 2023-05-19 LAB — MAGNESIUM: Magnesium: 2 mg/dL (ref 1.7–2.4)

## 2023-05-19 LAB — SEDIMENTATION RATE: Sed Rate: 24 mm/h — ABNORMAL HIGH (ref 0–16)

## 2023-05-19 LAB — PHOSPHORUS: Phosphorus: 3.5 mg/dL (ref 2.5–4.6)

## 2023-05-19 MED ORDER — INSULIN ASPART 100 UNIT/ML IJ SOLN
0.0000 [IU] | Freq: Three times a day (TID) | INTRAMUSCULAR | Status: DC
  Administered 2023-05-19 – 2023-05-20 (×2): 2 [IU] via SUBCUTANEOUS
  Administered 2023-05-20: 1 [IU] via SUBCUTANEOUS

## 2023-05-19 MED ORDER — HYDRALAZINE HCL 20 MG/ML IJ SOLN: 10.0000 mg | INTRAMUSCULAR | Status: DC | PRN

## 2023-05-19 MED ORDER — BISACODYL 5 MG PO TBEC
10.0000 mg | DELAYED_RELEASE_TABLET | Freq: Once | ORAL | Status: AC
  Administered 2023-05-19: 10 mg via ORAL
  Filled 2023-05-19: qty 2

## 2023-05-19 MED ORDER — METOPROLOL TARTRATE 5 MG/5ML IV SOLN: 5.0000 mg | INTRAVENOUS | Status: DC | PRN

## 2023-05-19 MED ORDER — SENNOSIDES-DOCUSATE SODIUM 8.6-50 MG PO TABS
1.0000 | ORAL_TABLET | Freq: Every evening | ORAL | Status: DC | PRN
  Administered 2023-05-19: 1 via ORAL
  Filled 2023-05-19: qty 1

## 2023-05-19 MED ORDER — TRAZODONE HCL 50 MG PO TABS
50.0000 mg | ORAL_TABLET | Freq: Every evening | ORAL | Status: DC | PRN
  Administered 2023-05-19: 50 mg via ORAL
  Filled 2023-05-19: qty 1

## 2023-05-19 MED ORDER — IPRATROPIUM-ALBUTEROL 0.5-2.5 (3) MG/3ML IN SOLN: 3.0000 mL | RESPIRATORY_TRACT | Status: DC | PRN

## 2023-05-19 NOTE — Progress Notes (Signed)
 [facility] Progress Note  CC:   Abdominal pain, pancreatis   Subjective: He endorses feeling quite well this morning.  No nausea or vomiting.  No abdominal pain.  He tolerated a clear liquid diet yesterday evening and this morning, the hospitalist advanced him to a full liquid diet which she just ordered.  Denies having any chest pain or shortness of breath.  No family at the bedside.   Objective:  Vital signs in last 24 hours: Temp:  [97.6 F (36.4 C)-98.5 F (36.9 C)] 98.5 F (36.9 C) (04/16 0405) Pulse Rate:  [63-78] 66 (04/16 0544) Resp:  [16-18] 18 (04/16 0405) BP: (122-147)/(69-94) 125/83 (04/16 0544) SpO2:  [100 %] 100 % (04/16 0405) Last BM Date : 05/17/23 General: Alert 53 year old male in no acute distress. Eyes: Lateral scleral icterus. Heart: Regular rate and rhythm, no murmurs. Pulm: Breath sounds clear throughout Abdomen: Soft, nondistended.  Nontender.  Positive bowel sounds to all 4 quadrants.  No palpable mass.  No bruit. Extremities: No edema. Neurologic:  Alert and oriented x 4.  Speech is clear.  Moves all extremities equally. Psych:  Alert and cooperative. Normal mood and affect.  Intake/Output from previous day: 04/15 0701 - 04/16 0700 In: 955 [P.O.:955] Out: -  Intake/Output this shift: No intake/output data recorded.  Lab Results: Recent Labs    05/17/23 0506 05/18/23 0458 05/19/23 0501  WBC 4.7 4.3 5.9  HGB 11.0* 10.9* 11.1*  HCT 32.3* 32.3* 32.4*  PLT 353 346 363   BMET Recent Labs    05/17/23 0506 05/18/23 0458 05/19/23 0501  NA 135 132* 134*  K 4.0 3.8 3.7  CL 101 100 100  CO2 24 23 23   GLUCOSE 134* 133* 114*  BUN 8 9 12   CREATININE 0.65 0.63 0.83  CALCIUM 9.0 9.1 9.4   LFT Recent Labs    05/17/23 0506 05/18/23 0458 05/19/23 0501  PROT 6.1*   < > 6.2*  ALBUMIN 2.6*   < > 2.7*  AST 179*   < > 143*  ALT 228*   < > 210*  ALKPHOS 445*   < > 473*  BILITOT 11.4*   < > 10.4*  BILIDIR 6.5*  --   --    <  > = values in this interval not displayed.   PT/INR Recent Labs    05/16/23 1927  LABPROT 14.4  INR 1.1   Hepatitis Panel No results for input(s): "HEPBSAG", "HCVAB", "HEPAIGM", "HEPBIGM" in the last 72 hours.  DG Abd 2 Views Result Date: 05/18/2023 CLINICAL DATA:  Vomiting, abdominal pain EXAM: ABDOMEN - 2 VIEW COMPARISON:  06/20/2019 FINDINGS: The bowel gas pattern is normal. There is no evidence of free air. No radio-opaque calculi or other significant radiographic abnormality is seen. IMPRESSION: Negative. Electronically Signed   By: Charlett Nose M.D.   On: 05/18/2023 18:38   Patient Profile: VAISHNAV DEMARTIN is a 53 y.o. male  Conn A Banghart is a 53 y.o. male with a past medical history of arthritis, hypertension, diabetes mellitus type 2, gout, GERD, H. pylori 07/2019 and chronic pancreatitis secondary to alcohol use disorder (abstinent x 1 year).  Readmitted to the hospital 05/16/2023 with LUQ pain, acute on chronic pancreatitis.  Assessment / Plan:  53 year old male with acute on chronic pancreatitis. Prior EUS 04/12/2023 by Dr. Edyth Gunnels at [facility] showed severe chronic pancreatitis, 1 abnormal lymph node was visualized in the peripancreatic reason and FNA was negative for malignancy. Recently admitted 3/31 - 05/06/2023 with  worsening pancreatitis and associated elevated T. Bil and LFTs treated with supportive care. Readmitted 05/16/2023 with recurrent LUQ pain and hemoptysis x 1 episode.  LFTs downtrending. T. Bili 10.4. Alk phos 473. AST 143. ALT 210. Lipase 207 -> 83 -> 49. WBC 4.7. Abdominal MRI/MRCP showed stable, moderate intrahepatic biliary dilatation, significant CBD dilatation in the porta hepatis, CBD is markedly compressed and narrow in the upper head of the pancreas, distended gallbladder with layering sludge or stones with pericholecystic fluid, no evidence of choledocholithiasis, significant inflammation/edema involving the head of the with persistent multiple intrapancreatic pseudocysts and  extensive pancreatic calcifications, stable PD dilatation, atrophy of the body and tail and significant inflammation to the 2nd and 3rd portions of the duodenum without obstruction.  He had abrupt LUQ pain with nausea and vomited x 1 episode yesterday, switched back to a clear liquid diet.  No further nausea, vomiting or abdominal pain today. - IV fluids and pain management per the hospitalist -Okay to advance to full liquid diet, if not tolerated will need to rediscuss Dobbhoff placement post second portion of the duodenum per IR versus TPN IV. - Ondansetron 4 mg PR IV every 6 hours as needed - No plans for ERCP at this juncture, however, if his total bili/direct bili and alk phos levels uptrend may consider ERCP for biliary stent placement for extrinsic CBC obstruction from severe pancreatitis/pancreatic edema - Hepatic panel in a.m. - Await further recommendations per Dr. Brice Campi   Mild normocytic anemia. Hg 12.7 -> 11.0 -> 11.1.  No overt GI bleeding.  Hemoptysis x 1 episode without further recurrence.  IV fluids/dilutional component likely a contributing factor. - CBC in am   Hemoptysis x 1 episode 4/13 without recurrence. CTA negative for PE. - Management per the medical service   History of colon polyps. Colonoscopy 07/2021 identified 1 tubular adenomatous and 1 hyperplastic polyp removed from the colon. -Next colon polyp surveillance colonoscopy due 07/2028   History of GERD. H. pylori gastritis per EGD 07/2021, treated with successful eradication. -Pantoprazole 40mg  po every day        Principal Problem:   Pancreatitis Active Problems:   Essential hypertension   GERD without esophagitis   Acute on chronic pancreatitis (HCC)   Alcohol abuse   Mixed diabetic hyperlipidemia associated with type 2 diabetes mellitus (HCC)   Dilation of pancreatic duct   Abnormal LFTs   Gilbert's syndrome   Generalized abdominal pain   Biliary obstruction   Bilious vomiting with nausea    Nutrition disorder     LOS: 2 days   Tory Freiberg  05/19/2023, 10:57 AM

## 2023-05-19 NOTE — Progress Notes (Signed)
 PROGRESS NOTE    Evan Sullivan  OZD:[id number] DOB: [birth year] DOA: 05/16/2023 PCP: Sandre Kitty, MD    Brief Narrative:  18.  History of pancreatitis, diabetes, HTN, HLD, GERD, obstructive jaundice admitted for abdominal pain.  Recently was referred to [facility] and underwent EUS on 3/10 which showed severe chronic pancreatitis.  GI is following this patient during the hospitalization.  Supportive care at this time.   Assessment & Plan:  Principal Problem:   Pancreatitis Active Problems:   Essential hypertension   GERD without esophagitis   Acute on chronic pancreatitis (HCC)   Alcohol abuse   Mixed diabetic hyperlipidemia associated with type 2 diabetes mellitus (HCC)   Dilation of pancreatic duct   Abnormal LFTs   Gilbert's syndrome   Generalized abdominal pain   Biliary obstruction   Bilious vomiting with nausea   Nutrition disorder   Acute on chronic severe pancreatitis Transaminitis with elevated total bilirubin - Patient is having difficult time tolerating oral liquid diet.  Will slowly advance as tolerated at this time.  Will place him on full liquid diet now GI team is following. - Creon - PPI IV twice daily  Alcohol use - Alcohol withdrawal protocol, multivitamin, thiamine and folic acid  Essential hypertension - Norvasc, hydralazine, isosorbide - IV as needed  GERD - PPI  Diabetes mellitus type 2 - Sliding scale and Accu-Cheks  Hyponatremia -Stable   DVT prophylaxis: SCDs Start: 05/16/23 2347    Code Status: Full Code Family Communication:   Status is: Inpatient Remains inpatient appropriate because: Slowly advancing diet    Subjective: Tells me he feels much better today and would like to advance his diet   Examination:  General exam: Appears calm and comfortable  Respiratory system: Clear to auscultation. Respiratory effort normal. Cardiovascular system: S1 & S2 heard, RRR. No JVD, murmurs, rubs, gallops or clicks. No pedal  edema. Gastrointestinal system: Abdomen is nondistended, soft and nontender. No organomegaly or masses felt. Normal bowel sounds heard. Central nervous system: Alert and oriented. No focal neurological deficits. Extremities: Symmetric 5 x 5 power. Skin: No rashes, lesions or ulcers Psychiatry: Judgement and insight appear normal. Mood & affect appropriate.                Diet Orders (From admission, onward)     Start     Ordered   05/19/23 2595  Diet full liquid Room service appropriate? Yes; Fluid consistency: Thin  Diet effective now       Question Answer Comment  Room service appropriate? Yes   Fluid consistency: Thin      05/19/23 0951            Objective: Vitals:   05/18/23 1946 05/18/23 2117 05/19/23 0405 05/19/23 0544  BP: 122/76 125/73 135/72 125/83  Pulse: 66 70 72 66  Resp: 16  18   Temp: 98.3 F (36.8 C)  98.5 F (36.9 C)   TempSrc: Oral     SpO2: 100%  100%   Weight:      Height:        Intake/Output Summary (Last 24 hours) at 05/19/2023 1131 Last data filed at 05/18/2023 1850 Gross per 24 hour  Intake 715 ml  Output --  Net 715 ml   Filed Weights   05/16/23 1324 05/16/23 2250  Weight: 83 kg 77.2 kg    Scheduled Meds:  amLODipine  10 mg Oral Daily   folic acid  1 mg Oral Daily   hydrALAZINE  50 mg Oral Q8H   insulin aspart  0-9 Units Subcutaneous Q4H   isosorbide dinitrate  20 mg Oral TID   lipase/protease/amylase  36,000 Units Oral TID AC   multivitamin with minerals  1 tablet Oral Daily   pantoprazole (PROTONIX) IV  40 mg Intravenous Q12H   thiamine  100 mg Oral Daily   Continuous Infusions:  Nutritional status     Body mass index is 22.47 kg/m.  Data Reviewed:   CBC: Recent Labs  Lab 05/16/23 1353 05/17/23 0506 05/18/23 0458 05/19/23 0501  WBC 5.1 4.7 4.3 5.9  NEUTROABS 3.2  --   --   --   HGB 12.7* 11.0* 10.9* 11.1*  HCT 36.3* 32.3* 32.3* 32.4*  MCV 90.3 89.5 91.5 89.3  PLT 415* 353 346 363   Basic  Metabolic Panel: Recent Labs  Lab 05/16/23 1353 05/17/23 0506 05/18/23 0458 05/19/23 0501  NA 133* 135 132* 134*  K 3.5 4.0 3.8 3.7  CL 101 101 100 100  CO2 23 24 23 23   GLUCOSE 126* 134* 133* 114*  BUN 6 8 9 12   CREATININE 0.41* 0.65 0.63 0.83  CALCIUM 9.1 9.0 9.1 9.4  MG  --  2.1 2.0 2.0  PHOS  --  2.8 3.0 3.5   GFR: Estimated Creatinine Clearance: 115 mL/min (by C-G formula based on SCr of 0.83 mg/dL). Liver Function Tests: Recent Labs  Lab 05/16/23 1353 05/17/23 0506 05/18/23 0458 05/19/23 0501  AST 235* 179* 204* 143*  ALT 277* 228* 235* 210*  ALKPHOS 483* 445* 463* 473*  BILITOT 13.0* 11.4* 10.6* 10.4*  PROT 7.3 6.1* 5.9* 6.2*  ALBUMIN 3.1* 2.6* 2.6* 2.7*   Recent Labs  Lab 05/16/23 1353 05/18/23 0458 05/19/23 0501  LIPASE 207* 83* 49   No results for input(s): "AMMONIA" in the last 168 hours. Coagulation Profile: Recent Labs  Lab 05/16/23 1927  INR 1.1   Cardiac Enzymes: No results for input(s): "CKTOTAL", "CKMB", "CKMBINDEX", "TROPONINI" in the last 168 hours. BNP (last 3 results) No results for input(s): "PROBNP" in the last 8760 hours. HbA1C: Recent Labs    05/16/23 1353  HGBA1C 7.5*   CBG: Recent Labs  Lab 05/18/23 1604 05/18/23 1948 05/18/23 2351 05/19/23 0403 05/19/23 0722  GLUCAP 164* 142* 128* 138* 124*   Lipid Profile: No results for input(s): "CHOL", "HDL", "LDLCALC", "TRIG", "CHOLHDL", "LDLDIRECT" in the last 72 hours. Thyroid Function Tests: No results for input(s): "TSH", "T4TOTAL", "FREET4", "T3FREE", "THYROIDAB" in the last 72 hours. Anemia Panel: Recent Labs    05/18/23 0458  FERRITIN 722*  TIBC 353  IRON 113   Sepsis Labs: No results for input(s): "PROCALCITON", "LATICACIDVEN" in the last 168 hours.  No results found for this or any previous visit (from the past 240 hours).       Radiology Studies: DG Abd 2 Views Result Date: 05/18/2023 CLINICAL DATA:  Vomiting, abdominal pain EXAM: ABDOMEN - 2 VIEW  COMPARISON:  06/20/2019 FINDINGS: The bowel gas pattern is normal. There is no evidence of free air. No radio-opaque calculi or other significant radiographic abnormality is seen. IMPRESSION: Negative. Electronically Signed   By: Charlett Nose M.D.   On: 05/18/2023 18:38           LOS: 2 days   Time spent= 35 mins    Miguel Rota, MD [facility]  If 7PM-7AM, please contact night-coverage  05/19/2023, 11:31 AM

## 2023-05-19 NOTE — Plan of Care (Signed)

## 2023-05-19 NOTE — Hospital Course (Addendum)
 Brief Narrative:  56.  History of pancreatitis, diabetes, HTN, HLD, GERD, obstructive jaundice admitted for abdominal pain.  Recently was referred to [facility] and underwent EUS on 3/10 which showed severe chronic pancreatitis.  GI is following this patient during the hospitalization.  Supportive care at this time.   Assessment & Plan:  Principal Problem:   Pancreatitis Active Problems:   Essential hypertension   GERD without esophagitis   Acute on chronic pancreatitis (HCC)   Alcohol abuse   Mixed diabetic hyperlipidemia associated with type 2 diabetes mellitus (HCC)   Dilation of pancreatic duct   Abnormal LFTs   Gilbert's syndrome   Generalized abdominal pain   Biliary obstruction   Bilious vomiting with nausea   Nutrition disorder   Acute on chronic severe pancreatitis Transaminitis with elevated total bilirubin - Patient is having difficult time tolerating oral liquid diet.  Will slowly advance as tolerated at this time.  Will place him on full liquid diet now GI team is following. - Creon - PPI IV twice daily  Alcohol use - Alcohol withdrawal protocol, multivitamin, thiamine and folic acid  Essential hypertension - Norvasc, hydralazine, isosorbide - IV as needed  GERD - PPI  Diabetes mellitus type 2 - Sliding scale and Accu-Cheks  Hyponatremia -Stable   DVT prophylaxis: SCDs Start: 05/16/23 2347    Code Status: Full Code Family Communication:   Status is: Inpatient Remains inpatient appropriate because: Slowly advancing diet    Subjective: Tells me he feels much better today and would like to advance his diet   Examination:  General exam: Appears calm and comfortable  Respiratory system: Clear to auscultation. Respiratory effort normal. Cardiovascular system: S1 & S2 heard, RRR. No JVD, murmurs, rubs, gallops or clicks. No pedal edema. Gastrointestinal system: Abdomen is nondistended, soft and nontender. No organomegaly or masses felt. Normal  bowel sounds heard. Central nervous system: Alert and oriented. No focal neurological deficits. Extremities: Symmetric 5 x 5 power. Skin: No rashes, lesions or ulcers Psychiatry: Judgement and insight appear normal. Mood & affect appropriate.

## 2023-05-20 ENCOUNTER — Telehealth: Payer: Self-pay | Admitting: Nurse Practitioner

## 2023-05-20 ENCOUNTER — Inpatient Hospital Stay (HOSPITAL_COMMUNITY)

## 2023-05-20 ENCOUNTER — Inpatient Hospital Stay: Admitting: Family Medicine

## 2023-05-20 ENCOUNTER — Other Ambulatory Visit: Payer: Self-pay

## 2023-05-20 ENCOUNTER — Other Ambulatory Visit (HOSPITAL_COMMUNITY): Payer: Self-pay

## 2023-05-20 DIAGNOSIS — K8689 Other specified diseases of pancreas: Secondary | ICD-10-CM

## 2023-05-20 DIAGNOSIS — K8500 Idiopathic acute pancreatitis without necrosis or infection: Secondary | ICD-10-CM | POA: Diagnosis not present

## 2023-05-20 DIAGNOSIS — D649 Anemia, unspecified: Secondary | ICD-10-CM

## 2023-05-20 DIAGNOSIS — R7989 Other specified abnormal findings of blood chemistry: Secondary | ICD-10-CM

## 2023-05-20 DIAGNOSIS — K859 Acute pancreatitis without necrosis or infection, unspecified: Secondary | ICD-10-CM

## 2023-05-20 LAB — CBC
HCT: 34.7 % — ABNORMAL LOW (ref 39.0–52.0)
Hemoglobin: 11.5 g/dL — ABNORMAL LOW (ref 13.0–17.0)
MCH: 31.5 pg (ref 26.0–34.0)
MCHC: 33.1 g/dL (ref 30.0–36.0)
MCV: 95.1 fL (ref 80.0–100.0)
Platelets: 338 10*3/uL (ref 150–400)
RBC: 3.65 MIL/uL — ABNORMAL LOW (ref 4.22–5.81)
RDW: 18.2 % — ABNORMAL HIGH (ref 11.5–15.5)
WBC: 6.4 10*3/uL (ref 4.0–10.5)
nRBC: 0 % (ref 0.0–0.2)

## 2023-05-20 LAB — COMPREHENSIVE METABOLIC PANEL WITH GFR
ALT: 202 U/L — ABNORMAL HIGH (ref 0–44)
AST: 118 U/L — ABNORMAL HIGH (ref 15–41)
Albumin: 2.9 g/dL — ABNORMAL LOW (ref 3.5–5.0)
Alkaline Phosphatase: 480 U/L — ABNORMAL HIGH (ref 38–126)
Anion gap: 13 (ref 5–15)
BUN: 13 mg/dL (ref 6–20)
CO2: 18 mmol/L — ABNORMAL LOW (ref 22–32)
Calcium: 9.3 mg/dL (ref 8.9–10.3)
Chloride: 102 mmol/L (ref 98–111)
Creatinine, Ser: 0.61 mg/dL (ref 0.61–1.24)
GFR, Estimated: >60 mL/min (ref >60)
Glucose, Bld: 167 mg/dL — ABNORMAL HIGH (ref 70–99)
Potassium: 3.4 mmol/L — ABNORMAL LOW (ref 3.5–5.1)
Sodium: 133 mmol/L — ABNORMAL LOW (ref 135–145)
Total Bilirubin: 14.1 mg/dL — ABNORMAL HIGH (ref 0.0–1.2)
Total Protein: 6.6 g/dL (ref 6.5–8.1)

## 2023-05-20 LAB — MAGNESIUM: Magnesium: 2.1 mg/dL (ref 1.7–2.4)

## 2023-05-20 LAB — GLUCOSE, CAPILLARY
Glucose-Capillary: 160 mg/dL — ABNORMAL HIGH (ref 70–99)
Glucose-Capillary: 128 mg/dL — ABNORMAL HIGH (ref 70–99)

## 2023-05-20 LAB — BILIRUBIN, DIRECT: Bilirubin, Direct: 8.1 mg/dL — ABNORMAL HIGH (ref 0.0–0.2)

## 2023-05-20 LAB — PHOSPHORUS: Phosphorus: 3.6 mg/dL (ref 2.5–4.6)

## 2023-05-20 MED ORDER — ISOSORBIDE DINITRATE 20 MG PO TABS
20.0000 mg | ORAL_TABLET | Freq: Three times a day (TID) | ORAL | 0 refills | Status: DC
  Filled 2023-05-20: qty 90, 30d supply, fill #0

## 2023-05-20 MED ORDER — POLYETHYLENE GLYCOL 3350 17 GM/SCOOP PO POWD
17.0000 g | Freq: Two times a day (BID) | ORAL | 0 refills | Status: AC
  Filled 2023-05-20: qty 238, 7d supply, fill #0

## 2023-05-20 MED ORDER — FOLIC ACID 1 MG PO TABS
1.0000 mg | ORAL_TABLET | Freq: Every day | ORAL | 0 refills | Status: DC
  Filled 2023-05-20: qty 30, 30d supply, fill #0

## 2023-05-20 MED ORDER — PANTOPRAZOLE SODIUM 40 MG PO TBEC
40.0000 mg | DELAYED_RELEASE_TABLET | Freq: Two times a day (BID) | ORAL | 0 refills | Status: DC
  Filled 2023-05-20: qty 60, 30d supply, fill #0

## 2023-05-20 MED ORDER — VITAMIN B-1 100 MG PO TABS
100.0000 mg | ORAL_TABLET | Freq: Every day | ORAL | 0 refills | Status: DC
  Filled 2023-05-20: qty 30, 30d supply, fill #0

## 2023-05-20 MED ORDER — HYDRALAZINE HCL 50 MG PO TABS
50.0000 mg | ORAL_TABLET | Freq: Three times a day (TID) | ORAL | 0 refills | Status: DC
  Filled 2023-05-20: qty 90, 30d supply, fill #0

## 2023-05-20 MED ORDER — BISACODYL 5 MG PO TBEC
10.0000 mg | DELAYED_RELEASE_TABLET | Freq: Two times a day (BID) | ORAL | 0 refills | Status: AC | PRN
  Filled 2023-05-20: qty 60, 15d supply, fill #0

## 2023-05-20 MED ORDER — ADULT MULTIVITAMIN W/MINERALS CH: 1.0000 | ORAL_TABLET | Freq: Every day | ORAL | Status: AC

## 2023-05-20 MED ORDER — BISACODYL 10 MG RE SUPP
10.0000 mg | Freq: Once | RECTAL | Status: AC
  Administered 2023-05-20: 10 mg via RECTAL
  Filled 2023-05-20: qty 1

## 2023-05-20 MED ORDER — POTASSIUM CHLORIDE CRYS ER 20 MEQ PO TBCR
40.0000 meq | EXTENDED_RELEASE_TABLET | Freq: Once | ORAL | Status: AC
  Administered 2023-05-20: 40 meq via ORAL
  Filled 2023-05-20: qty 2

## 2023-05-20 MED ORDER — PANTOPRAZOLE SODIUM 40 MG PO TBEC
40.0000 mg | DELAYED_RELEASE_TABLET | Freq: Two times a day (BID) | ORAL | Status: DC
  Administered 2023-05-20: 40 mg via ORAL
  Filled 2023-05-20: qty 1

## 2023-05-20 MED ORDER — POLYETHYLENE GLYCOL 3350 17 G PO PACK
17.0000 g | PACK | Freq: Two times a day (BID) | ORAL | Status: DC
  Administered 2023-05-20: 17 g via ORAL
  Filled 2023-05-20 (×2): qty 1

## 2023-05-20 MED ORDER — HYDROCODONE-ACETAMINOPHEN 5-325 MG PO TABS
1.0000 | ORAL_TABLET | Freq: Four times a day (QID) | ORAL | 0 refills | Status: DC | PRN
  Filled 2023-05-20: qty 30, 4d supply, fill #0

## 2023-05-20 MED ORDER — BISACODYL 5 MG PO TBEC: 10.0000 mg | DELAYED_RELEASE_TABLET | Freq: Every day | ORAL | Status: DC

## 2023-05-20 NOTE — Progress Notes (Deleted)
 Established Patient Office Visit  Subjective   Patient ID: Evan Sullivan, male    DOB: 1970-02-27  Age: 53 y.o. MRN: [id number]  No chief complaint on file.   HPI   The 10-year ASCVD risk score (Arnett DK, et al., 2019) is: 19.7%  Health Maintenance Due  Topic Date Due   Hepatitis C Screening  Never done   DTaP/Tdap/Td (1 - Tdap) Never done   Pneumococcal Vaccine 3-42 Years old (1 of 2 - PCV) Never done   Zoster Vaccines- Shingrix (1 of 2) Never done   COVID-19 Vaccine (3 - 2024-25 season) 10/04/2022   Diabetic kidney evaluation - Urine ACR  03/26/2023   OPHTHALMOLOGY EXAM  04/09/2023      Objective:     There were no vitals taken for this visit. {Vitals History (Optional):23777}  Physical Exam   Results for orders placed or performed during the hospital encounter of 05/16/23  Comprehensive metabolic panel  Result Value Ref Range   Sodium 133 (L) 135 - 145 mmol/L   Potassium 3.5 3.5 - 5.1 mmol/L   Chloride 101 98 - 111 mmol/L   CO2 23 22 - 32 mmol/L   Glucose, Bld 126 (H) 70 - 99 mg/dL   BUN 6 6 - 20 mg/dL   Creatinine, Ser 4.09 (L) 0.61 - 1.24 mg/dL   Calcium 9.1 8.9 - 81.1 mg/dL   Total Protein 7.3 6.5 - 8.1 g/dL   Albumin 3.1 (L) 3.5 - 5.0 g/dL   AST 914 (H) 15 - 41 U/L   ALT 277 (H) 0 - 44 U/L   Alkaline Phosphatase 483 (H) 38 - 126 U/L   Total Bilirubin 13.0 (H) 0.0 - 1.2 mg/dL   GFR, Estimated >78 >29 mL/min   Anion gap 9 5 - 15  Lipase, blood  Result Value Ref Range   Lipase 207 (H) 11 - 51 U/L  CBC with Diff  Result Value Ref Range   WBC 5.1 4.0 - 10.5 K/uL   RBC 4.02 (L) 4.22 - 5.81 MIL/uL   Hemoglobin 12.7 (L) 13.0 - 17.0 g/dL   HCT 56.2 (L) 13.0 - 86.5 %   MCV 90.3 80.0 - 100.0 fL   MCH 31.6 26.0 - 34.0 pg   MCHC 35.0 30.0 - 36.0 g/dL   RDW 78.4 (H) 69.6 - 29.5 %   Platelets 415 (H) 150 - 400 K/uL   nRBC 0.0 0.0 - 0.2 %   Neutrophils Relative % 62 %   Neutro Abs 3.2 1.7 - 7.7 K/uL   Lymphocytes Relative 22 %   Lymphs Abs 1.1 0.7 -  4.0 K/uL   Monocytes Relative 9 %   Monocytes Absolute 0.5 0.1 - 1.0 K/uL   Eosinophils Relative 6 %   Eosinophils Absolute 0.3 0.0 - 0.5 K/uL   Basophils Relative 1 %   Basophils Absolute 0.0 0.0 - 0.1 K/uL   Immature Granulocytes 0 %   Abs Immature Granulocytes 0.02 0.00 - 0.07 K/uL  Urinalysis, Routine w reflex microscopic -Urine, Clean Catch  Result Value Ref Range   Color, Urine AMBER (A) YELLOW   APPearance CLEAR CLEAR   Specific Gravity, Urine 1.023 1.005 - 1.030   pH 5.0 5.0 - 8.0   Glucose, UA NEGATIVE NEGATIVE mg/dL   Hgb urine dipstick NEGATIVE NEGATIVE   Bilirubin Urine MODERATE (A) NEGATIVE   Ketones, ur NEGATIVE NEGATIVE mg/dL   Protein, ur >=284 (A) NEGATIVE mg/dL   Nitrite NEGATIVE NEGATIVE   Leukocytes,Ua NEGATIVE  NEGATIVE   RBC / HPF 0-5 0 - 5 RBC/hpf   WBC, UA 11-20 0 - 5 WBC/hpf   Bacteria, UA NONE SEEN NONE SEEN   Squamous Epithelial / HPF 0-5 0 - 5 /HPF   Mucus PRESENT    Hyaline Casts, UA PRESENT   Protime-INR  Result Value Ref Range   Prothrombin Time 14.4 11.4 - 15.2 seconds   INR 1.1 0.8 - 1.2  Bilirubin, direct  Result Value Ref Range   Bilirubin, Direct 6.5 (H) 0.0 - 0.2 mg/dL  Magnesium  Result Value Ref Range   Magnesium 2.1 1.7 - 2.4 mg/dL  Phosphorus  Result Value Ref Range   Phosphorus 2.8 2.5 - 4.6 mg/dL  Comprehensive metabolic panel  Result Value Ref Range   Sodium 135 135 - 145 mmol/L   Potassium 4.0 3.5 - 5.1 mmol/L   Chloride 101 98 - 111 mmol/L   CO2 24 22 - 32 mmol/L   Glucose, Bld 134 (H) 70 - 99 mg/dL   BUN 8 6 - 20 mg/dL   Creatinine, Ser 1.61 0.61 - 1.24 mg/dL   Calcium 9.0 8.9 - 09.6 mg/dL   Total Protein 6.1 (L) 6.5 - 8.1 g/dL   Albumin 2.6 (L) 3.5 - 5.0 g/dL   AST 045 (H) 15 - 41 U/L   ALT 228 (H) 0 - 44 U/L   Alkaline Phosphatase 445 (H) 38 - 126 U/L   Total Bilirubin 11.4 (H) 0.0 - 1.2 mg/dL   GFR, Estimated >40 >98 mL/min   Anion gap 10 5 - 15  CBC  Result Value Ref Range   WBC 4.7 4.0 - 10.5 K/uL   RBC  3.61 (L) 4.22 - 5.81 MIL/uL   Hemoglobin 11.0 (L) 13.0 - 17.0 g/dL   HCT 11.9 (L) 14.7 - 82.9 %   MCV 89.5 80.0 - 100.0 fL   MCH 30.5 26.0 - 34.0 pg   MCHC 34.1 30.0 - 36.0 g/dL   RDW 56.2 (H) 13.0 - 86.5 %   Platelets 353 150 - 400 K/uL   nRBC 0.0 0.0 - 0.2 %  Hemoglobin A1c  Result Value Ref Range   Hgb A1c MFr Bld 7.5 (H) 4.8 - 5.6 %   Mean Plasma Glucose 169 mg/dL  Glucose, capillary  Result Value Ref Range   Glucose-Capillary 129 (H) 70 - 99 mg/dL  Glucose, capillary  Result Value Ref Range   Glucose-Capillary 120 (H) 70 - 99 mg/dL  Glucose, capillary  Result Value Ref Range   Glucose-Capillary 131 (H) 70 - 99 mg/dL  Glucose, capillary  Result Value Ref Range   Glucose-Capillary 270 (H) 70 - 99 mg/dL  Glucose, capillary  Result Value Ref Range   Glucose-Capillary 128 (H) 70 - 99 mg/dL  CBC  Result Value Ref Range   WBC 4.3 4.0 - 10.5 K/uL   RBC 3.53 (L) 4.22 - 5.81 MIL/uL   Hemoglobin 10.9 (L) 13.0 - 17.0 g/dL   HCT 78.4 (L) 69.6 - 29.5 %   MCV 91.5 80.0 - 100.0 fL   MCH 30.9 26.0 - 34.0 pg   MCHC 33.7 30.0 - 36.0 g/dL   RDW 28.4 (H) 13.2 - 44.0 %   Platelets 346 150 - 400 K/uL   nRBC 0.0 0.0 - 0.2 %  Comprehensive metabolic panel with GFR  Result Value Ref Range   Sodium 132 (L) 135 - 145 mmol/L   Potassium 3.8 3.5 - 5.1 mmol/L   Chloride 100 98 - 111  mmol/L   CO2 23 22 - 32 mmol/L   Glucose, Bld 133 (H) 70 - 99 mg/dL   BUN 9 6 - 20 mg/dL   Creatinine, Ser 0.86 0.61 - 1.24 mg/dL   Calcium 9.1 8.9 - 57.8 mg/dL   Total Protein 5.9 (L) 6.5 - 8.1 g/dL   Albumin 2.6 (L) 3.5 - 5.0 g/dL   AST 469 (H) 15 - 41 U/L   ALT 235 (H) 0 - 44 U/L   Alkaline Phosphatase 463 (H) 38 - 126 U/L   Total Bilirubin 10.6 (H) 0.0 - 1.2 mg/dL   GFR, Estimated >62 >95 mL/min   Anion gap 9 5 - 15  Magnesium  Result Value Ref Range   Magnesium 2.0 1.7 - 2.4 mg/dL  Phosphorus  Result Value Ref Range   Phosphorus 3.0 2.5 - 4.6 mg/dL  C-reactive protein  Result Value Ref Range    CRP 2.6 (H) <1.0 mg/dL  Lipase, blood  Result Value Ref Range   Lipase 83 (H) 11 - 51 U/L  Iron and TIBC  Result Value Ref Range   Iron 113 45 - 182 ug/dL   TIBC 284 132 - 440 ug/dL   Saturation Ratios 32 17.9 - 39.5 %   UIBC 240 ug/dL  Ferritin  Result Value Ref Range   Ferritin 722 (H) 24 - 336 ng/mL  Glucose, capillary  Result Value Ref Range   Glucose-Capillary 187 (H) 70 - 99 mg/dL  Glucose, capillary  Result Value Ref Range   Glucose-Capillary 144 (H) 70 - 99 mg/dL  Glucose, capillary  Result Value Ref Range   Glucose-Capillary 144 (H) 70 - 99 mg/dL  Glucose, capillary  Result Value Ref Range   Glucose-Capillary 123 (H) 70 - 99 mg/dL  Glucose, capillary  Result Value Ref Range   Glucose-Capillary 185 (H) 70 - 99 mg/dL  Glucose, capillary  Result Value Ref Range   Glucose-Capillary 164 (H) 70 - 99 mg/dL  CBC  Result Value Ref Range   WBC 5.9 4.0 - 10.5 K/uL   RBC 3.63 (L) 4.22 - 5.81 MIL/uL   Hemoglobin 11.1 (L) 13.0 - 17.0 g/dL   HCT 10.2 (L) 72.5 - 36.6 %   MCV 89.3 80.0 - 100.0 fL   MCH 30.6 26.0 - 34.0 pg   MCHC 34.3 30.0 - 36.0 g/dL   RDW 44.0 (H) 34.7 - 42.5 %   Platelets 363 150 - 400 K/uL   nRBC 0.0 0.0 - 0.2 %  Comprehensive metabolic panel with GFR  Result Value Ref Range   Sodium 134 (L) 135 - 145 mmol/L   Potassium 3.7 3.5 - 5.1 mmol/L   Chloride 100 98 - 111 mmol/L   CO2 23 22 - 32 mmol/L   Glucose, Bld 114 (H) 70 - 99 mg/dL   BUN 12 6 - 20 mg/dL   Creatinine, Ser 9.56 0.61 - 1.24 mg/dL   Calcium 9.4 8.9 - 38.7 mg/dL   Total Protein 6.2 (L) 6.5 - 8.1 g/dL   Albumin 2.7 (L) 3.5 - 5.0 g/dL   AST 564 (H) 15 - 41 U/L   ALT 210 (H) 0 - 44 U/L   Alkaline Phosphatase 473 (H) 38 - 126 U/L   Total Bilirubin 10.4 (H) 0.0 - 1.2 mg/dL   GFR, Estimated >33 >29 mL/min   Anion gap 11 5 - 15  Magnesium  Result Value Ref Range   Magnesium 2.0 1.7 - 2.4 mg/dL  Phosphorus  Result Value Ref Range  Phosphorus 3.5 2.5 - 4.6 mg/dL  Lipase, blood  Result  Value Ref Range   Lipase 49 11 - 51 U/L  C-reactive protein  Result Value Ref Range   CRP 10.4 (H) <1.0 mg/dL  Sedimentation rate  Result Value Ref Range   Sed Rate 24 (H) 0 - 16 mm/hr  Glucose, capillary  Result Value Ref Range   Glucose-Capillary 142 (H) 70 - 99 mg/dL  Glucose, capillary  Result Value Ref Range   Glucose-Capillary 128 (H) 70 - 99 mg/dL  Glucose, capillary  Result Value Ref Range   Glucose-Capillary 138 (H) 70 - 99 mg/dL  Glucose, capillary  Result Value Ref Range   Glucose-Capillary 124 (H) 70 - 99 mg/dL  Glucose, capillary  Result Value Ref Range   Glucose-Capillary 183 (H) 70 - 99 mg/dL  Glucose, capillary  Result Value Ref Range   Glucose-Capillary 139 (H) 70 - 99 mg/dL  CBC  Result Value Ref Range   WBC 6.4 4.0 - 10.5 K/uL   RBC 3.65 (L) 4.22 - 5.81 MIL/uL   Hemoglobin 11.5 (L) 13.0 - 17.0 g/dL   HCT 16.1 (L) 09.6 - 04.5 %   MCV 95.1 80.0 - 100.0 fL   MCH 31.5 26.0 - 34.0 pg   MCHC 33.1 30.0 - 36.0 g/dL   RDW 40.9 (H) 81.1 - 91.4 %   Platelets 338 150 - 400 K/uL   nRBC 0.0 0.0 - 0.2 %  Comprehensive metabolic panel with GFR  Result Value Ref Range   Sodium 133 (L) 135 - 145 mmol/L   Potassium 3.4 (L) 3.5 - 5.1 mmol/L   Chloride 102 98 - 111 mmol/L   CO2 18 (L) 22 - 32 mmol/L   Glucose, Bld 167 (H) 70 - 99 mg/dL   BUN 13 6 - 20 mg/dL   Creatinine, Ser 7.82 0.61 - 1.24 mg/dL   Calcium 9.3 8.9 - 95.6 mg/dL   Total Protein 6.6 6.5 - 8.1 g/dL   Albumin 2.9 (L) 3.5 - 5.0 g/dL   AST 213 (H) 15 - 41 U/L   ALT 202 (H) 0 - 44 U/L   Alkaline Phosphatase 480 (H) 38 - 126 U/L   Total Bilirubin 14.1 (H) 0.0 - 1.2 mg/dL   GFR, Estimated >08 >65 mL/min   Anion gap 13 5 - 15  Magnesium  Result Value Ref Range   Magnesium 2.1 1.7 - 2.4 mg/dL  Phosphorus  Result Value Ref Range   Phosphorus 3.6 2.5 - 4.6 mg/dL  Glucose, capillary  Result Value Ref Range   Glucose-Capillary 156 (H) 70 - 99 mg/dL  Glucose, capillary  Result Value Ref Range    Glucose-Capillary 160 (H) 70 - 99 mg/dL        Assessment & Plan:   There are no diagnoses linked to this encounter.   No follow-ups on file.    Laneta Pintos, MD

## 2023-05-20 NOTE — Telephone Encounter (Signed)
 Order and reminder in epic for labs, order in for ct scan. Rad scheduling to contact pt to set up scan appt.

## 2023-05-20 NOTE — Discharge Summary (Signed)
 Physician Discharge Summary  Evan Sullivan ZOX:[id number] DOB: [birth year] DOA: 05/16/2023  PCP: Sandre Kitty, MD  Admit date: 05/16/2023 Discharge date: 05/20/2023  Admitted From: Home Disposition:  Home  Recommendations for Outpatient Follow-up:  Follow up with PCP in 1-2 weeks Please obtain BMP/CBC in one week your next doctors visit.  Discontinue atenolol and lisinopril BP twice daily ordered Pain medication with bowel regimen.  Added Imdur 20 mg 3 times daily Folic acid, thiamine prescribed.  Recommended to take over-the-counter multivitamin Bowel regimen prescribed Will need repeat CT abdomen pelvis in about 1-2 weeks which will be arranged by outpatient [facility].  And eventually with [facility]  In Discharge Condition: Stable CODE STATUS: Full code Diet recommendation: Diabetic  Brief/Interim Summary: Brief Narrative:  60.  History of pancreatitis, diabetes, HTN, HLD, GERD, obstructive jaundice admitted for abdominal pain.  Recently was referred to [facility] and underwent EUS on 3/10 which showed severe chronic pancreatitis.  GI is following this patient during the hospitalization.  Supportive care at this time.  During the hospitalization patient was conservative managed by GI team and eventually will be discharged today as he is tolerating p.o.  Recommendation is stated above.   Assessment & Plan:  Principal Problem:   Pancreatitis Active Problems:   Essential hypertension   GERD without esophagitis   Acute on chronic pancreatitis (HCC)   Alcohol abuse   Mixed diabetic hyperlipidemia associated with type 2 diabetes mellitus (HCC)   Dilation of pancreatic duct   Abnormal LFTs   Gilbert's syndrome   Generalized abdominal pain   Biliary obstruction   Bilious vomiting with nausea   Nutrition disorder   Acute on chronic severe pancreatitis Transaminitis with elevated total bilirubin - Patient is not tolerating p.o. diet without any issues. - Continue home Creon.  PPI  twice daily.  Cleared by GI team.  Will need repeat CT scan of abdomen pelvis in about 2 weeks.  Follow-up outpatient [facility].  Arrangements will be made by their service and they will also arrange for outpatient follow-up with [facility].  Alcohol use - Alcohol withdrawal protocol, multivitamin, thiamine and folic acid  Essential hypertension - Norvasc, hydralazine, isosorbide - For now we have stopped atenolol and lisinopril but this can be readdressed in the future.  GERD - PPI  Diabetes mellitus type 2 - Resume home regimen  Hyponatremia -Stable   DVT prophylaxis: SCDs Start: 05/16/23 2347    Code Status: Full Code Family Communication:   Status is: Inpatient Remains inpatient appropriate because: Discharge today  Subjective: Feels well, wishing to go home  Examination:  General exam: Appears calm and comfortable  Respiratory system: Clear to auscultation. Respiratory effort normal. Cardiovascular system: S1 & S2 heard, RRR. No JVD, murmurs, rubs, gallops or clicks. No pedal edema. Gastrointestinal system: Abdomen is nondistended, soft and nontender. No organomegaly or masses felt. Normal bowel sounds heard. Central nervous system: Alert and oriented. No focal neurological deficits. Extremities: Symmetric 5 x 5 power. Skin: No rashes, lesions or ulcers Psychiatry: Judgement and insight appear normal. Mood & affect appropriate.    Discharge Diagnoses:  Principal Problem:   Pancreatitis Active Problems:   Essential hypertension   GERD without esophagitis   Acute on chronic pancreatitis (HCC)   Alcohol abuse   Mixed diabetic hyperlipidemia associated with type 2 diabetes mellitus (HCC)   Dilation of pancreatic duct   Abnormal LFTs   Gilbert's syndrome   Generalized abdominal pain   Biliary obstruction   Bilious  vomiting with nausea   Nutrition disorder   History of chronic pancreatitis   Duodenitis   Mild malnutrition (HCC)   Acute pancreatic fluid  collection      Discharge Exam: Vitals:   05/20/23 0439 05/20/23 0905  BP: (!) 153/73 132/80  Pulse: 75 73  Resp: 16 18  Temp: 98.3 F (36.8 C) 97.8 F (36.6 C)  SpO2: 100% 99%   Vitals:   05/19/23 2007 05/19/23 2207 05/20/23 0439 05/20/23 0905  BP: 118/75 (!) 168/101 (!) 153/73 132/80  Pulse: 82  75 73  Resp: 18  16 18   Temp: 98.7 F (37.1 C)  98.3 F (36.8 C) 97.8 F (36.6 C)  TempSrc: Oral  Oral Oral  SpO2: 100%  100% 99%  Weight:      Height:          Discharge Instructions   Allergies as of 05/20/2023   No Known Allergies      Medication List     STOP taking these medications    atenolol 100 MG tablet Commonly known as: TENORMIN   lisinopril 20 MG tablet Commonly known as: ZESTRIL       TAKE these medications    amLODipine 10 MG tablet Commonly known as: NORVASC TAKE 1 TABLET BY MOUTH EVERY DAY   bisacodyl 5 MG EC tablet Commonly known as: DULCOLAX Take 2 tablets (10 mg total) by mouth 2 (two) times daily as needed for moderate constipation or severe constipation.   colchicine 0.6 MG tablet Take 1 capsule by mouth daily as needed (gout pain)   Creon 36000-114000 units Cpep capsule Generic drug: lipase/protease/amylase TAKE 2 CAPSULES (72,000 UNITS TOTAL) BY MOUTH WITH BREAKFAST, WITH LUNCH, AND WITH EVENING MEAL.   diphenhydramine-acetaminophen 25-500 MG Tabs tablet Commonly known as: TYLENOL PM Take 2 tablets by mouth at bedtime as needed (Sleep/Pain).   fenofibrate 48 MG tablet Commonly known as: TRICOR TAKE 1 TABLET BY MOUTH EVERY DAY   folic acid 1 MG tablet Commonly known as: FOLVITE Take 1 tablet (1 mg total) by mouth daily. Start taking on: May 21, 2023   hydrALAZINE 50 MG tablet Commonly known as: APRESOLINE Take 1 tablet (50 mg total) by mouth every 8 (eight) hours.   HYDROcodone-acetaminophen 5-325 MG tablet Commonly known as: NORCO/VICODIN Take 1-2 tablets by mouth every 6 (six) hours as needed for moderate  pain (pain score 4-6) or severe pain (pain score 7-10).   hydrocortisone cream 1 % Apply 1 application  topically daily as needed (rash).   isosorbide dinitrate 20 MG tablet Commonly known as: ISORDIL Take 1 tablet (20 mg total) by mouth 3 (three) times daily.   multivitamin with minerals Tabs tablet Take 1 tablet by mouth daily. Start taking on: May 21, 2023   ondansetron 4 MG tablet Commonly known as: Zofran Dissolve 1 tablet (4 mg total) by mouth daily as needed for nausea or vomiting.   pantoprazole 40 MG tablet Commonly known as: PROTONIX Take 1 tablet (40 mg total) by mouth 2 (two) times daily. What changed:  when to take this additional instructions   polyethylene glycol powder 17 GM/SCOOP powder Commonly known as: GLYCOLAX/MIRALAX Take 17 grams dissolved in liquid by mouth 2 (two) times daily for 5 days.   thiamine 100 MG tablet Commonly known as: Vitamin B-1 Take 1 tablet (100 mg total) by mouth daily. Start taking on: May 21, 2023        No Known Allergies  You were cared for by a hospitalist  during your hospital stay. If you have any questions about your discharge medications or the care you received while you were in the hospital after you are discharged, you can call the unit and asked to speak with the hospitalist on call if the hospitalist that took care of you is not available. Once you are discharged, your primary care physician will handle any further medical issues. Please note that no refills for any discharge medications will be authorized once you are discharged, as it is imperative that you return to your primary care physician (or establish a relationship with a primary care physician if you do not have one) for your aftercare needs so that they can reassess your need for medications and monitor your lab values.  You were cared for by a hospitalist during your hospital stay. If you have any questions about your discharge medications or the care you  received while you were in the hospital after you are discharged, you can call the unit and asked to speak with the hospitalist on call if the hospitalist that took care of you is not available. Once you are discharged, your primary care physician will handle any further medical issues. Please note that NO REFILLS for any discharge medications will be authorized once you are discharged, as it is imperative that you return to your primary care physician (or establish a relationship with a primary care physician if you do not have one) for your aftercare needs so that they can reassess your need for medications and monitor your lab values.  Please request your Prim.MD to go over all Hospital Tests and Procedure/Radiological results at the follow up, please get all Hospital records sent to your Prim MD by signing hospital release before you go home.  Get CBC, CMP, 2 view Chest X Keng checked  by Primary MD during your next visit or SNF MD in 5-7 days ( we routinely change or add medications that can affect your baseline labs and fluid status, therefore we recommend that you get the mentioned basic workup next visit with your PCP, your PCP may decide not to get them or add new tests based on their clinical decision)  On your next visit with your primary care physician please Get Medicines reviewed and adjusted.  If you experience worsening of your admission symptoms, develop shortness of breath, life threatening emergency, suicidal or homicidal thoughts you must seek medical attention immediately by calling 911 or calling your MD immediately  if symptoms less severe.  You Must read complete instructions/literature along with all the possible adverse reactions/side effects for all the Medicines you take and that have been prescribed to you. Take any new Medicines after you have completely understood and accpet all the possible adverse reactions/side effects.   Do not drive, operate heavy machinery, perform  activities at heights, swimming or participation in water activities or provide baby sitting services if your were admitted for syncope or siezures until you have seen by Primary MD or a Neurologist and advised to do so again.  Do not drive when taking Pain medications.   Procedures/Studies: DG Abd 1 View Result Date: 05/20/2023 CLINICAL DATA:  Abdominal distension.  Constipation. EXAM: ABDOMEN - 1 VIEW COMPARISON:  05/18/2023 FINDINGS: No gaseous small bowel dilatation. Air in stool are seen scattered along the length of a mildly distended colon. Dystrophic calcification seen in the region of the head of the pancreas, better characterized by recent CT abdomen/pelvis. Status post right hip replacement. IMPRESSION: Mildly distended colon  with air and stool. No gaseous small bowel dilatation. Electronically Signed   By: Donnal Fusi M.D.   On: 05/20/2023 09:20   DG Abd 2 Views Result Date: 05/18/2023 CLINICAL DATA:  Vomiting, abdominal pain EXAM: ABDOMEN - 2 VIEW COMPARISON:  06/20/2019 FINDINGS: The bowel gas pattern is normal. There is no evidence of free air. No radio-opaque calculi or other significant radiographic abnormality is seen. IMPRESSION: Negative. Electronically Signed   By: Janeece Mechanic M.D.   On: 05/18/2023 18:38   MR ABDOMEN MRCP W WO CONTAST Result Date: 05/16/2023 CLINICAL DATA:  Follow-up pancreatitis. EXAM: MRI ABDOMEN WITHOUT AND WITH CONTRAST (INCLUDING MRCP) TECHNIQUE: Multiplanar multisequence MR imaging of the abdomen was performed both before and after the administration of intravenous contrast. Heavily T2-weighted images of the biliary and pancreatic ducts were obtained, and three-dimensional MRCP images were rendered by post processing. CONTRAST:  8mL GADAVIST GADOBUTROL 1 MMOL/ML IV SOLN COMPARISON:  CT scan, same date.  Prior MRI abdomen 05/03/2023 FINDINGS: Lower chest: The lung bases are clear of an acute process. No pleural or pericardial effusion. Hepatobiliary: Stable  appearing moderate intrahepatic biliary dilatation. There is also persistent significant common bile duct dilatation in the porta hepatis. The common bile duct is markedly compressed and becomes quite narrow in the upper head of the pancreas. This is likely due to significant acute on chronic pancreatitis. No hepatic lesions are identified. The portal and hepatic veins are patent. The gallbladder is distended and contains layering high T1 material which is likely inspissated bile. There is also some layering sludge or small gallstones. Mild pericholecystic fluid likely related to the patient's pancreatitis. No common bile duct stones are identified. Pancreas: Significant inflammation/edema involving the head of the pancreas along with peripancreatic and periduodenal fluid. Persistent multiple intrapancreatic pseudocysts and extensive pancreatic calcifications, better seen on the CT scan. Marked stable dilatation of the main pancreatic duct. Stable moderate atrophy of the body and tail region of the pancreas. Spleen:  Normal size.  No focal lesions. Adrenals/Urinary Tract: The adrenal glands and kidneys are unremarkable and stable. Stomach/Bowel: There is significant inflammation of the second and third portions of the duodenum surrounded by inflammatory phlegmon and fluid. Is moderate enhancement of the duodenal wall but no mechanical or functional obstruction. Stomach is unremarkable. The visualized small bowel colon are grossly normal. Vascular/Lymphatic: No pathologically enlarged lymph nodes identified. No abdominal aortic aneurysm demonstrated. The portal and splenic veins are patent. The SMV is patent. Other: Small amount of fluid surrounding the pancreatic head and duodenum and also a small amount of fluid in the right anterior pararenal space but no overt ascites. Musculoskeletal: No significant bony findings. IMPRESSION: 1. Stable appearing moderate intrahepatic biliary dilatation and significant common  bile duct dilatation in the porta hepatis. The common bile duct is markedly compressed and becomes quite narrow in the upper head of the pancreas. This is likely due to significant acute on chronic pancreatitis. Findings similar to prior MRI. 2. Distended gallbladder containing layering high T1 material which is likely inspissated bile. There is also some layering sludge or small gallstones. Mild pericholecystic fluid likely related to the patient's pancreatitis. No common bile duct stones are identified. 3. Significant inflammation/edema involving the head of the pancreas along with peripancreatic and periduodenal fluid. Persistent multiple intrapancreatic pseudocysts and extensive pancreatic calcifications, better seen on the CT scan. Marked stable dilatation of the main pancreatic duct. Stable moderate atrophy of the body and tail region of the pancreas. 4. Significant inflammation of the  second and third portions of the duodenum surrounded by inflammatory phlegmon and fluid. No mechanical or functional obstruction. Electronically Signed   By: Rudie Meyer M.D.   On: 05/16/2023 22:06   CT Angio Chest PE W/Cm &/Or Wo Cm Result Date: 05/16/2023 CLINICAL DATA:  Pulmonary embolism (PE) suspected, high prob. Hemoptysis. EXAM: CT ANGIOGRAPHY CHEST WITH CONTRAST TECHNIQUE: Multidetector CT imaging of the chest was performed using the standard protocol during bolus administration of intravenous contrast. Multiplanar CT image reconstructions and MIPs were obtained to evaluate the vascular anatomy. RADIATION DOSE REDUCTION: This exam was performed according to the departmental dose-optimization program which includes automated exposure control, adjustment of the mA and/or kV according to patient size and/or use of iterative reconstruction technique. CONTRAST:  75mL OMNIPAQUE IOHEXOL 350 MG/ML SOLN COMPARISON:  10/15/2012 FINDINGS: Cardiovascular: No filling defects in the pulmonary arteries to suggest pulmonary emboli.  Heart is normal size. Aorta is normal caliber. Mediastinum/Nodes: No mediastinal, hilar, or axillary adenopathy. Trachea and esophagus are unremarkable. Thyroid unremarkable. Lungs/Pleura: No confluent opacities or effusions. Upper Abdomen: See report from abdominal CT today. Musculoskeletal: Chest wall soft tissues are unremarkable. No acute bony abnormality. Review of the MIP images confirms the above findings. IMPRESSION: No evidence of pulmonary embolus. No acute cardiopulmonary disease. Electronically Signed   By: Charlett Nose M.D.   On: 05/16/2023 21:25   CT ABDOMEN PELVIS W CONTRAST Result Date: 05/16/2023 CLINICAL DATA:  Abdominal pain and known pancreatitis EXAM: CT ABDOMEN AND PELVIS WITH CONTRAST TECHNIQUE: Multidetector CT imaging of the abdomen and pelvis was performed using the standard protocol following bolus administration of intravenous contrast. RADIATION DOSE REDUCTION: This exam was performed according to the departmental dose-optimization program which includes automated exposure control, adjustment of the mA and/or kV according to patient size and/or use of iterative reconstruction technique. CONTRAST:  OMNIPAQUE IOHEXOL 300 MG/ML  SOLN COMPARISON:  MRI from 05/03/2023, CT from 05/03/2023 FINDINGS: Lower chest: No acute abnormality. Hepatobiliary: Liver is well visualized with mild intra hepatic ductal dilatation similar to that seen on the prior exam. Gallbladder is well distended without evidence of cholelithiasis. The common bile duct is prominent as is the pancreatic duct secondary to changes of prior pancreatitis in the region of the pancreatic head. Pancreas: Pancreas demonstrates scattered calcifications consistent with chronic pancreatitis. Dilatation of the pancreatic duct is again seen. The head and uncinate process are enlarged but relatively stable from the prior exam. Slight increase in the degree of peripancreatic inflammatory changes noted consistent with an acute on  chronic component. Spleen: Normal in size without focal abnormality. Adrenals/Urinary Tract: Adrenal glands are within normal limits. Kidneys demonstrate a normal enhancement pattern bilaterally. No renal calculi or obstructive changes are noted. The bladder is within normal limits. Stomach/Bowel: No obstructive or inflammatory changes of the colon are seen. The appendix is within normal limits. Small bowel and stomach are unremarkable with the exception of inflammatory changes in the second portion the duodenum related to the adjacent pancreatitis. These changes are similar in appearance. Vascular/Lymphatic: Aortic atherosclerosis. No enlarged abdominal or pelvic lymph nodes. Reproductive: Prostate is unremarkable. Other: No abdominal wall hernia or abnormality. No abdominopelvic ascites. Musculoskeletal: Right hip replacement is noted. Mild degenerative changes of lumbar spine are seen. IMPRESSION: Changes consistent with acute on chronic pancreatitis with slight increase in the degree of peripancreatic inflammatory change when compared with the prior exam. This causes compression upon the central pancreatic duct and common bile duct with dilatation of both peripherally. Ductal changes are stable  in appearance. No other focal abnormality is noted. Electronically Signed   By: Alcide Clever M.D.   On: 05/16/2023 20:25   DG Chest 2 View Result Date: 05/16/2023 CLINICAL DATA:  Hemoptysis EXAM: CHEST - 2 VIEW COMPARISON:  Chest CT report only 10/20/2012 FINDINGS: The heart size and mediastinal contours are within normal limits. Both lungs are clear. The visualized skeletal structures are unremarkable. IMPRESSION: No active cardiopulmonary disease. Electronically Signed   By: Darliss Cheney M.D.   On: 05/16/2023 16:06   MR ABDOMEN MRCP W WO CONTAST Result Date: 05/03/2023 CLINICAL DATA:  Acute on chronic pancreatitis, intermittent vomiting and abdominal pain EXAM: MRI ABDOMEN WITHOUT AND WITH CONTRAST (INCLUDING  MRCP) TECHNIQUE: Multiplanar multisequence MR imaging of the abdomen was performed both before and after the administration of intravenous contrast. Heavily T2-weighted images of the biliary and pancreatic ducts were obtained, and three-dimensional MRCP images were rendered by post processing. CONTRAST:  8mL GADAVIST GADOBUTROL 1 MMOL/ML IV SOLN COMPARISON:  CT abdomen pelvis, 05/03/2023, 02/18/2023 FINDINGS: Lower chest: No acute abnormality. Hepatobiliary: No solid liver abnormality is seen. No gallstones. Similar, mild gallbladder wall thickening. Severe intra and extrahepatic biliary ductal dilatation, which is somewhat increased compared to examination dated 02/18/2023 common bile duct measuring 1.3 cm, previously 0.8 cm (series 3, image 15). Common bile duct is again nearly effaced within the superior pancreatic head. Pancreas: Severe stigmata of chronic pancreatitis, including multiple pseudocysts within the pancreatic head not significantly changed compared to examination dated 02/18/2023, and diffuse atrophy atrophy of the distal pancreas. Severe ductal dilatation, duct measuring up to 1.1 cm and, the main pancreatic duct effaced within the pancreatic head (series 4, image 18). Diffuse inflammatory fat stranding and fluid about the pancreas, predominantly of the pancreatic head, and to a lesser extent the adjacent retroperitoneum and small bowel mesentery. Spleen: Normal in size without significant abnormality. Adrenals/Urinary Tract: Adrenal glands are unremarkable. Kidneys are normal, without renal calculi, solid lesion, or hydronephrosis. Stomach/Bowel: Stomach is within normal limits. No evidence of bowel wall thickening, distention, or inflammatory changes. Vascular/Lymphatic: Aortic atherosclerosis. No enlarged abdominal lymph nodes. Other: No abdominal wall hernia or abnormality. No ascites. Musculoskeletal: No acute or significant osseous findings. IMPRESSION: 1. Severe stigmata of chronic  pancreatitis, including multiple pseudocysts within the pancreatic head, not significantly changed compared to examination dated 02/18/2023, and diffuse atrophy of the distal pancreas. Severe pancreatic ductal dilatation, duct measuring up to 1.1 cm and, the main pancreatic duct effaced within the pancreatic head. 2. Diffuse inflammatory fat stranding and fluid about the pancreas, predominantly of the pancreatic head, and to a lesser extent the adjacent retroperitoneum and small bowel mesentery. Findings are consistent with acute on chronic pancreatitis. 3. Severe intra and extrahepatic biliary ductal dilatation, which is somewhat increased compared to examination dated 02/18/2023. Common bile duct is again nearly effaced within the superior pancreatic head. No choledocholithiasis. 4. Similar, mild gallbladder wall thickening. Aortic Atherosclerosis (ICD10-I70.0). Electronically Signed   By: Jearld Lesch M.D.   On: 05/03/2023 21:13   MR 3D Recon At Scanner Result Date: 05/03/2023 CLINICAL DATA:  Acute on chronic pancreatitis, intermittent vomiting and abdominal pain EXAM: MRI ABDOMEN WITHOUT AND WITH CONTRAST (INCLUDING MRCP) TECHNIQUE: Multiplanar multisequence MR imaging of the abdomen was performed both before and after the administration of intravenous contrast. Heavily T2-weighted images of the biliary and pancreatic ducts were obtained, and three-dimensional MRCP images were rendered by post processing. CONTRAST:  8mL GADAVIST GADOBUTROL 1 MMOL/ML IV SOLN COMPARISON:  CT abdomen pelvis,  05/03/2023, 02/18/2023 FINDINGS: Lower chest: No acute abnormality. Hepatobiliary: No solid liver abnormality is seen. No gallstones. Similar, mild gallbladder wall thickening. Severe intra and extrahepatic biliary ductal dilatation, which is somewhat increased compared to examination dated 02/18/2023 common bile duct measuring 1.3 cm, previously 0.8 cm (series 3, image 15). Common bile duct is again nearly effaced within  the superior pancreatic head. Pancreas: Severe stigmata of chronic pancreatitis, including multiple pseudocysts within the pancreatic head not significantly changed compared to examination dated 02/18/2023, and diffuse atrophy atrophy of the distal pancreas. Severe ductal dilatation, duct measuring up to 1.1 cm and, the main pancreatic duct effaced within the pancreatic head (series 4, image 18). Diffuse inflammatory fat stranding and fluid about the pancreas, predominantly of the pancreatic head, and to a lesser extent the adjacent retroperitoneum and small bowel mesentery. Spleen: Normal in size without significant abnormality. Adrenals/Urinary Tract: Adrenal glands are unremarkable. Kidneys are normal, without renal calculi, solid lesion, or hydronephrosis. Stomach/Bowel: Stomach is within normal limits. No evidence of bowel wall thickening, distention, or inflammatory changes. Vascular/Lymphatic: Aortic atherosclerosis. No enlarged abdominal lymph nodes. Other: No abdominal wall hernia or abnormality. No ascites. Musculoskeletal: No acute or significant osseous findings. IMPRESSION: 1. Severe stigmata of chronic pancreatitis, including multiple pseudocysts within the pancreatic head, not significantly changed compared to examination dated 02/18/2023, and diffuse atrophy of the distal pancreas. Severe pancreatic ductal dilatation, duct measuring up to 1.1 cm and, the main pancreatic duct effaced within the pancreatic head. 2. Diffuse inflammatory fat stranding and fluid about the pancreas, predominantly of the pancreatic head, and to a lesser extent the adjacent retroperitoneum and small bowel mesentery. Findings are consistent with acute on chronic pancreatitis. 3. Severe intra and extrahepatic biliary ductal dilatation, which is somewhat increased compared to examination dated 02/18/2023. Common bile duct is again nearly effaced within the superior pancreatic head. No choledocholithiasis. 4. Similar, mild  gallbladder wall thickening. Aortic Atherosclerosis (ICD10-I70.0). Electronically Signed   By: Fredricka Jenny M.D.   On: 05/03/2023 21:13   CT ABDOMEN PELVIS W CONTRAST Result Date: 05/03/2023 CLINICAL DATA:  Abdominal pain. EXAM: CT ABDOMEN AND PELVIS WITH CONTRAST TECHNIQUE: Multidetector CT imaging of the abdomen and pelvis was performed using the standard protocol following bolus administration of intravenous contrast. RADIATION DOSE REDUCTION: This exam was performed according to the departmental dose-optimization program which includes automated exposure control, adjustment of the mA and/or kV according to patient size and/or use of iterative reconstruction technique. CONTRAST:  100mL OMNIPAQUE IOHEXOL 300 MG/ML  SOLN COMPARISON:  CT abdomen pelvis dated 02/18/2023. FINDINGS: Lower chest: The visualized lung bases are clear. No intra-abdominal free air.  Small free fluid in the pelvis. Hepatobiliary: The liver is unremarkable. Mild biliary dilatation. The gallbladder is distended. No calcified gallstone. Pancreas: There is inflammatory changes of the pancreas. Enlargement of the proximal pancreas predominantly involving the head and uncinate process with dilatation of the main pancreatic duct. Coarse calcifications of the proximal pancreas sequela of chronic pancreatitis. Similar appearance of small cystic structures in the head and uncinate process of the pancreas, likely pseudocysts. Spleen: Normal in size without focal abnormality. Adrenals/Urinary Tract: The adrenal glands are unremarkable. The kidneys, visualized ureters, and urinary bladder appear unremarkable. Stomach/Bowel: There is no bowel obstruction or active inflammation. The appendix is normal. Vascular/Lymphatic: Mild aortoiliac atherosclerotic disease. The IVC is unremarkable. No portal venous gas. There is compression of the main portal vein by the inflammatory changes of the pancreas. No adenopathy. Reproductive: The prostate and seminal  vesicles are  grossly remarkable. Other: Intramuscular lipoma of the left groin. Musculoskeletal: Total right hip arthroplasty. No acute osseous pathology. IMPRESSION: 1. Acute on chronic pancreatitis. Similar small pseudocyst in the uncinate process and head of the pancreas. 2. No bowel obstruction. Normal appendix. Electronically Signed   By: Elgie Collard M.D.   On: 05/03/2023 13:30     The results of significant diagnostics from this hospitalization (including imaging, microbiology, ancillary and laboratory) are listed below for reference.     Microbiology: No results found for this or any previous visit (from the past 240 hours).   Labs: BNP (last 3 results) No results for input(s): "BNP" in the last 8760 hours. Basic Metabolic Panel: Recent Labs  Lab 05/16/23 1353 05/17/23 0506 05/18/23 0458 05/19/23 0501 05/20/23 0536  NA 133* 135 132* 134* 133*  K 3.5 4.0 3.8 3.7 3.4*  CL 101 101 100 100 102  CO2 23 24 23 23  18*  GLUCOSE 126* 134* 133* 114* 167*  BUN 6 8 9 12 13   CREATININE 0.41* 0.65 0.63 0.83 0.61  CALCIUM 9.1 9.0 9.1 9.4 9.3  MG  --  2.1 2.0 2.0 2.1  PHOS  --  2.8 3.0 3.5 3.6   Liver Function Tests: Recent Labs  Lab 05/16/23 1353 05/17/23 0506 05/18/23 0458 05/19/23 0501 05/20/23 0536  AST 235* 179* 204* 143* 118*  ALT 277* 228* 235* 210* 202*  ALKPHOS 483* 445* 463* 473* 480*  BILITOT 13.0* 11.4* 10.6* 10.4* 14.1*  PROT 7.3 6.1* 5.9* 6.2* 6.6  ALBUMIN 3.1* 2.6* 2.6* 2.7* 2.9*   Recent Labs  Lab 05/16/23 1353 05/18/23 0458 05/19/23 0501  LIPASE 207* 83* 49   No results for input(s): "AMMONIA" in the last 168 hours. CBC: Recent Labs  Lab 05/16/23 1353 05/17/23 0506 05/18/23 0458 05/19/23 0501 05/20/23 0536  WBC 5.1 4.7 4.3 5.9 6.4  NEUTROABS 3.2  --   --   --   --   HGB 12.7* 11.0* 10.9* 11.1* 11.5*  HCT 36.3* 32.3* 32.3* 32.4* 34.7*  MCV 90.3 89.5 91.5 89.3 95.1  PLT 415* 353 346 363 338   Cardiac Enzymes: No results for input(s):  "CKTOTAL", "CKMB", "CKMBINDEX", "TROPONINI" in the last 168 hours. BNP: Invalid input(s): "POCBNP" CBG: Recent Labs  Lab 05/19/23 0722 05/19/23 1144 05/19/23 1650 05/19/23 2008 05/20/23 0737  GLUCAP 124* 183* 139* 156* 160*   D-Dimer No results for input(s): "DDIMER" in the last 72 hours. Hgb A1c No results for input(s): "HGBA1C" in the last 72 hours. Lipid Profile No results for input(s): "CHOL", "HDL", "LDLCALC", "TRIG", "CHOLHDL", "LDLDIRECT" in the last 72 hours. Thyroid function studies No results for input(s): "TSH", "T4TOTAL", "T3FREE", "THYROIDAB" in the last 72 hours.  Invalid input(s): "FREET3" Anemia work up Recent Labs    05/18/23 0458  FERRITIN 722*  TIBC 353  IRON 113   Urinalysis    Component Value Date/Time   COLORURINE AMBER (A) 05/16/2023 1351   APPEARANCEUR CLEAR 05/16/2023 1351   APPEARANCEUR Clear 03/24/2021 1454   LABSPEC 1.023 05/16/2023 1351   PHURINE 5.0 05/16/2023 1351   GLUCOSEU NEGATIVE 05/16/2023 1351   HGBUR NEGATIVE 05/16/2023 1351   BILIRUBINUR MODERATE (A) 05/16/2023 1351   BILIRUBINUR negative 07/09/2022 1627   BILIRUBINUR Negative 03/24/2021 1454   KETONESUR NEGATIVE 05/16/2023 1351   PROTEINUR >=300 (A) 05/16/2023 1351   UROBILINOGEN 4.0 (A) 07/09/2022 1627   NITRITE NEGATIVE 05/16/2023 1351   LEUKOCYTESUR NEGATIVE 05/16/2023 1351   Sepsis Labs Recent Labs  Lab 05/17/23 0506 05/18/23  4098 05/19/23 0501 05/20/23 0536  WBC 4.7 4.3 5.9 6.4   Microbiology No results found for this or any previous visit (from the past 240 hours).   Time coordinating discharge:  I have spent 35 minutes face to face with the patient and on the ward discussing the patients care, assessment, plan and disposition with other care givers. >50% of the time was devoted counseling the patient about the risks and benefits of treatment/Discharge disposition and coordinating care.   SIGNED:   Maggie Schooner, MD  [facility] 05/20/2023, 11:14  AM   If 7PM-7AM, please contact night-coverage

## 2023-05-20 NOTE — TOC Transition Note (Signed)
 Transition of Care [facility]) - Discharge Note   Patient Details  Name: Evan Sullivan MRN: [id number] Date of Birth: [birth year]  [facility]) CM/SW Contact:  Ruben Corolla, RN Phone Number: 05/20/2023, 11:20 AM   Clinical Narrative:  d/c home No needs.     Final next level of care: Home/Self Care Barriers to Discharge: No Barriers Identified   Patient Goals and CMS Choice Patient states their goals for this hospitalization and ongoing recovery are:: Home CMS Medicare.gov Compare Post Acute Care list provided to:: Patient Choice offered to / list presented to : Patient [place] ownership interest in [facility].provided to:: Patient    Discharge Placement                       Discharge Plan and Services Additional resources added to the After Visit Summary for                                       Social Drivers of Health (SDOH) Interventions SDOH Screenings   Food Insecurity: No Food Insecurity (05/16/2023)  Housing: Low Risk  (05/16/2023)  Transportation Needs: No Transportation Needs (05/16/2023)  Utilities: Not At Risk (05/16/2023)  Alcohol Screen: Low Risk  (03/24/2021)  Depression (PHQ2-9): Low Risk  (11/03/2022)  Financial Resource Strain: Unknown (04/23/2017)   Received from [facility], [facility]  Physical Activity: Unknown (04/23/2017)   Received from [facility], [facility]  Social Connections: Unknown (04/23/2017)   Received from [facility], [facility]  Stress: Unknown (04/23/2017)   Received from [facility], [facility]  Tobacco Use: Low Risk  (05/16/2023)     Readmission Risk Interventions    05/04/2023   12:50 PM  Readmission Risk Prevention Plan  Post Dischage Appt Complete  Medication Screening Complete  Transportation Screening Complete

## 2023-05-20 NOTE — Progress Notes (Signed)
 [facility] Progress Note  CC:  Abdominal pain, pancreatis    Subjective: He ate grits and a banana this morning and 10 minutes later he had some stomach gurgling without any abdominal pain.  Vomiting.  He passed a small formed nonbloody stool this morning.  No chest pain or shortness of breath.  He is ambulating in the hallway and questions when he can go home.   Objective:  Vital signs in last 24 hours: Temp:  [97.8 F (36.6 C)-98.7 F (37.1 C)] 97.8 F (36.6 C) (04/17 0905) Pulse Rate:  [73-82] 73 (04/17 0905) Resp:  [16-20] 18 (04/17 0905) BP: (118-168)/(70-101) 132/80 (04/17 0905) SpO2:  [99 %-100 %] 99 % (04/17 0905) Last BM Date : 05/17/23 General: 53 year old male cheerful ambulating in the hallway. Eyes: Moderate scleral icterus present. Heart: Regular rate and rhythm, no murmurs. Pulm: Breath sounds clear throughout. Abdomen: Soft, nontender.  Positive bowel sounds to all 4 quadrants. Extremities:  Without edema. Neurologic:  Alert and  oriented x 4. Grossly normal neurologically. Psych:  Alert and cooperative. Normal mood and affect.  Intake/Output from previous day: No intake/output data recorded. Intake/Output this shift: No intake/output data recorded.  Lab Results: Recent Labs    05/18/23 0458 05/19/23 0501 05/20/23 0536  WBC 4.3 5.9 6.4  HGB 10.9* 11.1* 11.5*  HCT 32.3* 32.4* 34.7*  PLT 346 363 338   BMET Recent Labs    05/18/23 0458 05/19/23 0501 05/20/23 0536  NA 132* 134* 133*  K 3.8 3.7 3.4*  CL 100 100 102  CO2 23 23 18*  GLUCOSE 133* 114* 167*  BUN 9 12 13   CREATININE 0.63 0.83 0.61  CALCIUM 9.1 9.4 9.3   LFT Recent Labs    05/20/23 0536  PROT 6.6  ALBUMIN 2.9*  AST 118*  ALT 202*  ALKPHOS 480*  BILITOT 14.1*   PT/INR No results for input(s): "LABPROT", "INR" in the last 72 hours. Hepatitis Panel No results for input(s): "HEPBSAG", "HCVAB", "HEPAIGM", "HEPBIGM" in the last 72 hours.  DG Abd 1  View Result Date: 05/20/2023 CLINICAL DATA:  Abdominal distension.  Constipation. EXAM: ABDOMEN - 1 VIEW COMPARISON:  05/18/2023 FINDINGS: No gaseous small bowel dilatation. Air in stool are seen scattered along the length of a mildly distended colon. Dystrophic calcification seen in the region of the head of the pancreas, better characterized by recent CT abdomen/pelvis. Status post right hip replacement. IMPRESSION: Mildly distended colon with air and stool. No gaseous small bowel dilatation. Electronically Signed   By: Kennith Center M.D.   On: 05/20/2023 09:20   DG Abd 2 Views Result Date: 05/18/2023 CLINICAL DATA:  Vomiting, abdominal pain EXAM: ABDOMEN - 2 VIEW COMPARISON:  06/20/2019 FINDINGS: The bowel gas pattern is normal. There is no evidence of free air. No radio-opaque calculi or other significant radiographic abnormality is seen. IMPRESSION: Negative. Electronically Signed   By: Charlett Nose M.D.   On: 05/18/2023 18:38   Patient Profile: Evan Sullivan is a 53 y.o. male  Trino A Deike is a 53 y.o. male with a past medical history of arthritis, hypertension, diabetes mellitus type 2, gout, GERD, H. pylori 07/2019 and chronic pancreatitis secondary to alcohol use disorder (abstinent x 1 year).  Readmitted to the hospital 05/16/2023 with LUQ pain, acute on chronic pancreatitis.   Assessment / Plan:  53 year old male with acute on chronic pancreatitis. Prior EUS 04/12/2023 by Dr. Edyth Gunnels at [facility] showed severe chronic pancreatitis, 1 abnormal lymph node was  visualized in the peripancreatic reason and FNA was negative for malignancy. Recently admitted 3/31 - 05/06/2023 with worsening pancreatitis and associated elevated T. Bil and LFTs treated with supportive care. Readmitted 05/16/2023 with recurrent LUQ pain and hemoptysis x 1 episode. Today T. Bili up from 10.4 to 14.1. Direct bili 8.1. Alk phos 473 -> 480. AST 142 -> 118. ALT 210 -> 202. Lipase 207 -> 83 -> 49. WBC 4.7. Abdominal MRI/MRCP showed stable,  moderate intrahepatic biliary dilatation, significant CBD dilatation in the porta hepatis, CBD markedly compressed and narrow in the upper head of the pancreas, distended gallbladder with layering sludge or stones with pericholecystic fluid, no evidence of choledocholithiasis, significant inflammation/edema involving the head of the with persistent multiple intrapancreatic pseudocysts, extensive pancreatic calcifications, stable PD dilatation, atrophy of the body/tail and significant inflammation to the 2nd and 3rd portions of the duodenum without obstruction. He had abrupt LUQ pain with nausea and vomited x 1 episode 4/15, switched back to a clear liquid diet then advanced to full and soft diet today. No further nausea, vomiting or abdominal pain.  - No plans for ERCP at this juncture, however, if his total bili/direct bili and alk phos levels uptrend may consider ERCP for biliary stent placement for extrinsic CBC obstruction from severe pancreatitis/pancreatic edema -Ok to discharge home today from GI perspective -Our office will facilitate repeat hepatic panel in on week, CTAP with oral and IV contrast 1 1/2 to 2 weeks and to schedule a follow up appointment with his primary GI, Dr. Cherryl Corona or APP in 2 to 3 weeks. -He will also need follow-up with the [facility] advanced endoscopy team ( Dr. Daphne Eagles) that has initiated his workup for chronic pancreatitis. We will try to expedite this follow-up appointment.   Mild normocytic anemia. Hg 12.7 -> 11.0 -> 11.1 -> 11.5. No overt GI bleeding.  Hemoptysis x 1 episode without further recurrence.  IV fluids/dilutional component likely a contributing factor.   Hemoptysis x 1 episode 4/13 without recurrence. CTA negative for PE. - Management per the medical service   History of colon polyps. Colonoscopy 07/2021 identified 1 tubular adenomatous and 1 hyperplastic polyp removed from the colon. -Next colon polyp surveillance colonoscopy due 07/2028   History of GERD. H.  pylori gastritis per EGD 07/2021, treated with successful eradication. -Pantoprazole 40mg  po every day     Mild hyponatremia, hypokalemia -Management per the hospitalist       Principal Problem:   Pancreatitis Active Problems:   Essential hypertension   GERD without esophagitis   Acute on chronic pancreatitis (HCC)   Alcohol abuse   Mixed diabetic hyperlipidemia associated with type 2 diabetes mellitus (HCC)   Dilation of pancreatic duct   Abnormal LFTs   Gilbert's syndrome   Generalized abdominal pain   Biliary obstruction   Bilious vomiting with nausea   Nutrition disorder   History of chronic pancreatitis   Duodenitis   Mild malnutrition (HCC)   Acute pancreatic fluid collection     LOS: 3 days   Tory Freiberg  05/20/2023, 10:56 AM

## 2023-05-20 NOTE — Telephone Encounter (Signed)
 Dottie/Linda: Patient will be discharged from [facility] to home later today.  Please contact the patient and send him to our lab in 1 week for a CBC, hepatic panel and BMP.  Schedule him for a CTAP with oral and IV contrast in 1-1/2 to 2 weeks to follow-up on his acute on chronic pancreatitis.  Christy, pls schedule patient for a follow-up appointment with Dr. Cherryl Corona for APP in pod A in 2 to 3 weeks. Aaron Aas

## 2023-05-21 ENCOUNTER — Telehealth: Payer: Self-pay | Admitting: *Deleted

## 2023-05-21 NOTE — Transitions of Care (Post Inpatient/ED Visit) (Signed)
 05/21/2023  Name: Evan Sullivan MRN: [id number] DOB: 1970/12/22  Today's TOC FU Call Status: Today's TOC FU Call Status:: Successful TOC FU Call Completed TOC FU Call Complete Date: 05/21/23 Patient's Name and Date of Birth confirmed.  Transition Care Management Follow-up Telephone Call Date of Discharge: 05/20/23 Discharge Facility: [facility]) Type of Discharge: Inpatient Admission Primary Inpatient Discharge Diagnosis:: Pancreatitis How have you been since you were released from the hospital?: Better Any questions or concerns?: No  Items Reviewed: Did you receive and understand the discharge instructions provided?: No Medications obtained,verified, and reconciled?: Yes (Medications Reviewed) Any new allergies since your discharge?: No Dietary orders reviewed?: No Do you have support at home?: Yes People in Home [RPT]: significant other Name of Support/Comfort Primary Source: Nichole mother and Angela  Medications Reviewed Today: Medications Reviewed Today     Reviewed by Kennieth Cathlean DEL, RN (Case Manager) on 05/21/23 at 0935  Med List Status: <None>   Medication Order Taking? Sig Documenting Provider Last Dose Status Informant  amLODipine  (NORVASC ) 10 MG tablet [id number] Yes TAKE 1 TABLET BY MOUTH EVERY DAY Chandra Toribio POUR, MD Taking Active Self, Pharmacy Records  bisacodyl  (DULCOLAX) 5 MG EC tablet [id number] Yes Take 2 tablets (10 mg total) by mouth 2 (two) times daily as needed for moderate constipation or severe constipation. Caleen Burgess BROCKS, MD Taking Active   colchicine  0.6 MG tablet [id number] Yes Take 1 capsule by mouth daily as needed (gout pain) McDonough, Tinnie POUR, PA-C Taking Active Self, Pharmacy Records  CREON  36000-114000 units CPEP capsule [id number] Yes TAKE 2 CAPSULES (72,000 UNITS TOTAL) BY MOUTH WITH BREAKFAST, WITH LUNCH, AND WITH EVENING MEAL. Stacia Glendia BRAVO, MD Taking Active Self, Pharmacy Records  diphenhydramine -acetaminophen  (TYLENOL  PM) 25-500  MG TABS tablet [id number] Yes Take 2 tablets by mouth at bedtime as needed (Sleep/Pain). [provider] Taking Active Self, Pharmacy Records  fenofibrate  (TRICOR ) 48 MG tablet [id number] Yes TAKE 1 TABLET BY MOUTH EVERY DAY Chandra Toribio POUR, MD Taking Active Self, Pharmacy Records  folic acid  (FOLVITE ) 1 MG tablet [id number] Yes Take 1 tablet (1 mg total) by mouth daily. Caleen Burgess BROCKS, MD Taking Active   hydrALAZINE  (APRESOLINE ) 50 MG tablet [id number] Yes Take 1 tablet (50 mg total) by mouth every 8 (eight) hours. Caleen Burgess BROCKS, MD Taking Active   HYDROcodone -acetaminophen  (NORCO/VICODIN) 5-325 MG tablet [id number] Yes Take 1-2 tablets by mouth every 6 (six) hours as needed for moderate pain (pain score 4-6) or severe pain (pain score 7-10). Caleen Burgess BROCKS, MD Taking Active   hydrocortisone  cream 1 % [id number] Yes Apply 1 application  topically daily as needed (rash). [provider] Taking Active Self, Pharmacy Records  isosorbide  dinitrate (ISORDIL ) 20 MG tablet [id number] Yes Take 1 tablet (20 mg total) by mouth 3 (three) times daily. Caleen Burgess BROCKS, MD Taking Active   Multiple Vitamin (MULTIVITAMIN WITH MINERALS) TABS tablet [id number] Yes Take 1 tablet by mouth daily. Caleen Burgess BROCKS, MD Taking Active   ondansetron  (ZOFRAN ) 4 MG tablet [id number] Yes Dissolve 1 tablet (4 mg total) by mouth daily as needed for nausea or vomiting. Kenard Zachary PARAS, MD Taking Active Self, Pharmacy Records  pantoprazole  (PROTONIX ) 40 MG tablet [id number] Yes Take 1 tablet (40 mg total) by mouth 2 (two) times daily. Caleen Burgess BROCKS, MD Taking Active   polyethylene glycol powder (GLYCOLAX /MIRALAX ) 17 GM/SCOOP powder [id number] Yes Take 17 grams dissolved in liquid by mouth 2 (two) times daily for 5 days. Amin, Ankit C,  MD Taking Active   thiamine  (VITAMIN B-1) 100 MG tablet [id number] Yes Take 1 tablet (100 mg total) by mouth daily. Caleen Burgess BROCKS, MD Taking Active             Home Care and  Equipment/Supplies: Were Home Health Services Ordered?: NA Any new equipment or medical supplies ordered?: NA  Functional Questionnaire: Do you need assistance with bathing/showering or dressing?: No Do you need assistance with meal preparation?: No Do you need assistance with eating?: No Do you have difficulty maintaining continence: No Do you need assistance with getting out of bed/getting out of a chair/moving?: No Do you have difficulty managing or taking your medications?: No  Follow up appointments reviewed: PCP Follow-up appointment confirmed?: Yes Date of PCP follow-up appointment?: 06/04/23 Follow-up Provider: Alm Comer Specialist [facility] Follow-up appointment confirmed?: Yes Date of Specialist follow-up appointment?: 05/25/23 Follow-Up Specialty Provider:: [id number] Radiology, [id number] Dr [facility]areld gastroenterology Do you need transportation to your follow-up appointment?: No Do you understand care options if your condition(s) worsen?: Yes-patient verbalized understanding  SDOH Interventions Today    Flowsheet Row Most Recent Value  SDOH Interventions   Food Insecurity Interventions Intervention Not Indicated  Housing Interventions Intervention Not Indicated  Transportation Interventions Intervention Not Indicated, Patient Resources (Friends/Family)  Utilities Interventions Intervention Not Indicated       Goals Addressed             This Visit's Progress    VBCI Transitions of Care (TOC) Care Plan       Problems:  Recent Hospitalization for treatment of  Pancreatitis ETOH withdrawl  Goal:  Over the next 30 days, the patient will not experience hospital readmission  Interventions:  Transitions of Care: Labs CBC, BMP at follow up visit Doctor Visits  - discussed the importance of doctor visits Arranged PCP follow-up within 12-14 days (Care Guide Scheduled)  Patient Self Care Activities:  Attend all scheduled provider appointments Call pharmacy for  medication refills 3-7 days in advance of running out of medications Call provider office for new concerns or questions  Notify RN Care Manager of TOC call rescheduling needs Participate in Transition of Care Program/Attend TOC scheduled calls Perform all self care activities independently  Perform IADL's (shopping, preparing meals, housekeeping, managing finances) independently Take medications as prescribed    Plan:  An initial telephone outreach has been scheduled for:  Next PCP appointment scheduled for:  [id number]      Patient consented to follow up outreach with Alan Ee RN Case Manager  for  [id number]  11:00  Cathlean Headland BSN RN [facility] Care Management Coordinator [email or web address] Direct Dial: [phone]  Fax: [phone] Website: [email or web address]

## 2023-05-26 ENCOUNTER — Telehealth: Payer: Self-pay | Admitting: Gastroenterology

## 2023-05-26 ENCOUNTER — Other Ambulatory Visit: Payer: Self-pay

## 2023-05-26 NOTE — Telephone Encounter (Signed)
 Let pt know he is supposed to come for labs tomorrow or Friday, orders are in epic. Pt states he will come tomorrow.

## 2023-05-26 NOTE — Telephone Encounter (Signed)
 Patient called and stated that he was not sure of when he needs to come in and get his lab work done. Patient also stated that he was last seen in the ED and saw Eliezer Groves. Patient stated that she had advised him hat he would need to get blood work done. Patient is requesting a call back. Please advise.

## 2023-05-26 NOTE — Telephone Encounter (Signed)
 MRI reports faxed to Dr Otilio Block at [facility] Pt asking if his FMLA paperwork has been completed. Maya do you know if FMLA papers have been done?

## 2023-05-26 NOTE — Telephone Encounter (Signed)
 Inbound call from patient stating that Dr. Cherryl Corona had referred him to a Doctor at [facility] ( Dr. Otilio Block and he had went yesterday and they were going to do a MRI. They advised him that they did not want to do it because he had told them he had been seen at [facility] and they had done MRI's while he was there. [facility] advised patient he needed to contact us  to have the results sent over  to them so they can get patient rescheduled. Patient is requesting a call back to discuss and to also see if his FMLA paperwork had been approved. Please advise.

## 2023-05-26 NOTE — Patient Outreach (Signed)
 Transition of Care week 2  Visit Note  05/26/2023  Name: Evan Sullivan MRN: [id number]          DOB: 10-31-1970  Situation: Patient enrolled in [facility] 30-day program. Visit completed with patient by telephone.   Background: Patient reports that he is doing well. Reports bowels are moving on a  regular basis. Reports last BM today which was normal in color. Reports urine is yellow. States that he is trying to stay hydrated.  Pending hospital follow up and CT scan on 06/04/2023. Labs were supposed to be completed 1 week post discharged and this was not completed. Denies ETOH. Patient reports that had some abdominal pain last night which was sharp in nature and then went away. Denies any bleeding. Reports that he stopped his protonix  because  he felt it caused him pain.  Initial Transition Care Management Follow-up Telephone Call    Past Medical History:  Diagnosis Date   Anemia    Arthritis    Avascular necrosis of femoral head, right (HCC) 2019   Diet-controlled type 2 diabetes mellitus (HCC) 09/07/2021   GERD (gastroesophageal reflux disease)    Gout    Hypertension    Peptic ulcer disease     Assessment: Patient Reported Symptoms: Cognitive Cognitive Status: Alert and oriented to person, place, and time, Normal speech and language skills      Neurological Neurological Review of Symptoms: Not assessed    HEENT HEENT Symptoms Reported: Not assessed      Cardiovascular Cardiovascular Symptoms Reported: Not assessed    Respiratory Respiratory Symptoms Reported: Not assesed    Endocrine Is patient diabetic?: Yes Is patient checking blood sugars at home?: No    Gastrointestinal Gastrointestinal Symptoms Reported: Abdominal pain or discomfort Additional Gastrointestinal Details: Reports BM today.  States that he had abdominal pain last night. Pain came and left. Denies any fever, denies NV. Gastrointestinal Conditions: Abdominal pain, Constipation Gastrointestinal Self-Management  Outcome: 3 (uncertain) Gastrointestinal Comment: Denies ETOH for the past year.  MD follow up reviewed.  Has not had repeat labs.    Genitourinary Genitourinary Symptoms Reported: Not assessed    Integumentary Integumentary Symptoms Reported: Not assessed    Musculoskeletal Musculoskelatal Symptoms Reviewed: Not assessed        Psychosocial Psychosocial Symptoms Reported: Not assessed         There were no vitals filed for this visit.  Medications Reviewed Today     Reviewed by Vanetta Generous, RN (Registered Nurse) on 05/26/23 at 1132  Med List Status: <None>   Medication Order Taking? Sig Documenting Provider Last Dose Status Informant  amLODipine  (NORVASC ) 10 MG tablet [id number] Yes TAKE 1 TABLET BY MOUTH EVERY DAY Laneta Pintos, MD Taking Active Self, Pharmacy Records  bisacodyl  (DULCOLAX) 5 MG EC tablet [id number] Yes Take 2 tablets (10 mg total) by mouth 2 (two) times daily as needed for moderate constipation or severe constipation. Maggie Schooner, MD Taking Active   colchicine  0.6 MG tablet [id number] Yes Take 1 capsule by mouth daily as needed (gout pain) McDonough, Lillie Reining, PA-C Taking Active Self, Pharmacy Records  CREON  36000-114000 units CPEP capsule [id number] Yes TAKE 2 CAPSULES (72,000 UNITS TOTAL) BY MOUTH WITH BREAKFAST, WITH LUNCH, AND WITH EVENING MEAL. Elois Hair, MD Taking Active Self, Pharmacy Records  diphenhydramine -acetaminophen  (TYLENOL  PM) 25-500 MG TABS tablet [id number] Yes Take 2 tablets by mouth at bedtime as needed (Sleep/Pain). [provider] Taking Active Self, Pharmacy Records  fenofibrate  (TRICOR ) 48 MG tablet  [id number] Yes TAKE 1 TABLET BY MOUTH EVERY DAY Laneta Pintos, MD Taking Active Self, Pharmacy Records  folic acid  (FOLVITE ) 1 MG tablet [id number] Yes Take 1 tablet (1 mg total) by mouth daily. Maggie Schooner, MD Taking Active   hydrALAZINE  (APRESOLINE ) 50 MG tablet [id number] Yes Take 1 tablet (50 mg total) by mouth every 8  (eight) hours. Maggie Schooner, MD Taking Active   HYDROcodone -acetaminophen  (NORCO/VICODIN) 5-325 MG tablet [id number] Yes Take 1-2 tablets by mouth every 6 (six) hours as needed for moderate pain (pain score 4-6) or severe pain (pain score 7-10). Maggie Schooner, MD Taking Active   hydrocortisone  cream 1 % [id number] Yes Apply 1 application  topically daily as needed (rash). [provider] Taking Active Self, Pharmacy Records  isosorbide  dinitrate (ISORDIL ) 20 MG tablet [id number] Yes Take 1 tablet (20 mg total) by mouth 3 (three) times daily. Maggie Schooner, MD Taking Active   Multiple Vitamin (MULTIVITAMIN WITH MINERALS) TABS tablet [id number] Yes Take 1 tablet by mouth daily. Maggie Schooner, MD Taking Active   ondansetron  (ZOFRAN ) 4 MG tablet [id number] Yes Dissolve 1 tablet (4 mg total) by mouth daily as needed for nausea or vomiting. True Fuss, MD Taking Active Self, Pharmacy Records  pantoprazole  (PROTONIX ) 40 MG tablet [id number] No Take 1 tablet (40 mg total) by mouth 2 (two) times daily.  Patient not taking: Reported on 05/26/2023   Maggie Schooner, MD Not Taking Active            Med Note (ROSE, Nohelani Benning U   Wed May 26, 2023 11:32 AM) Reports that he has stopped this medications because he states that it caused abdominal pain.   polyethylene glycol powder (GLYCOLAX /MIRALAX ) 17 GM/SCOOP powder [id number] Yes Take 17 grams dissolved in liquid by mouth 2 (two) times daily for 5 days. Maggie Schooner, MD Taking Active   thiamine  (VITAMIN B-1) 100 MG tablet [id number] Yes Take 1 tablet (100 mg total) by mouth daily. Maggie Schooner, MD Taking Active             Recommendation:   PCP Follow-up on 06/04/2023 and CT scan on 06/04/2023 Lab requests: was ordered for 1 week post hospital. Encouraged patient to call the office to see if he can get his labs completed.    Follow Up Plan:   Telephone follow-up in 1 week  Orpha Blade, RN, BSN, [employer]- Transition of Care Team.  [facility] [phone]

## 2023-05-26 NOTE — Patient Instructions (Signed)
 Visit Information  Thank you for taking time to visit with me today. Please don't hesitate to contact me if I can be of assistance to you before our next scheduled telephone appointment.  Our next appointment is by telephone on 06/02/2023 at 11am  Following is a copy of your care plan:   Goals Addressed             This Visit's Progress    [facility] Transitions of Care (TOC) Care Plan       Problems:  Recent Hospitalization for treatment of  Pancreatitis . 05/26/2023  Reports he is feeling some better. Wants to know when he can stop some of his medications.  Reports some abdominal pain last night and the pain went away. Reports normal bowel movements and last one was today.  ETOH withdrawal.  06/25/2023  Denies any ETOH for 1 year.   Goal:  Over the next 30 days, the patient will not experience hospital readmission  Interventions:  Transitions of Care: Labs CBC, BMP at follow up visit.   Reviewed with patient to call MD office to schedule labs.  Doctor Visits  - discussed the importance of doctor visits. Reviewed hospital follow up appointment for 06/04/2023 Arranged PCP follow-up within 12-14 days (Care Guide Scheduled) Reviewed all medications and encouraged patient to take all his medications as prescribed and inquire about his request to stop some of his medications during his office visit.  Encouraged patient to stay hydrated and eat balanced meals.   Patient Self Care Activities:  Attend all scheduled provider appointments Call pharmacy for medication refills 3-7 days in advance of running out of medications Call provider office for new concerns or questions  Notify RN Care Manager of TOC call rescheduling needs Participate in Transition of Care Program/Attend TOC scheduled calls Perform all self care activities independently  Perform IADL's (shopping, preparing meals, housekeeping, managing finances) independently Take medications as prescribed   Call MD office to schedule lab  appointment Reviewed with patient to call MD for any changes in conditions.  Reviewed pending MD and CT appointment for 06/04/2023  Plan:  Telephone follow up appointment with care management team member scheduled for:  06/02/2023. Provided my contact information for patient to call me if needed.          Patient verbalizes understanding of instructions and care plan provided today and agrees to view in MyChart. Active MyChart status and patient understanding of how to access instructions and care plan via MyChart confirmed with patient.     Telephone follow up appointment with care management team member scheduled for: 06/02/2023  Please call the care guide team at [phone] if you need to cancel or reschedule your appointment.   Please call the Suicide and Crisis Lifeline: 988 call the [place]  National Suicide Prevention Lifeline: 618 688 2361 or TTY: 270-480-6266 TTY (256)084-5134) to talk to a trained counselor call 1-800-273-TALK (toll free, 24 hour hotline) go to [facility] [address] [place] [phone]) call 911 if you are experiencing a Mental Health or Behavioral Health Crisis or need someone to talk to.  Orpha Blade, RN, BSN, CEN [employer]- Transition of Care Team.  [facility] [phone]

## 2023-05-27 ENCOUNTER — Other Ambulatory Visit (INDEPENDENT_AMBULATORY_CARE_PROVIDER_SITE_OTHER)

## 2023-05-27 DIAGNOSIS — K859 Acute pancreatitis without necrosis or infection, unspecified: Secondary | ICD-10-CM

## 2023-05-27 DIAGNOSIS — R7989 Other specified abnormal findings of blood chemistry: Secondary | ICD-10-CM | POA: Diagnosis not present

## 2023-05-27 LAB — HEPATIC FUNCTION PANEL
ALT: 128 U/L — ABNORMAL HIGH (ref 0–53)
AST: 65 U/L — ABNORMAL HIGH (ref 0–37)
Albumin: 3.8 g/dL (ref 3.5–5.2)
Alkaline Phosphatase: 454 U/L — ABNORMAL HIGH (ref 39–117)
Bilirubin, Direct: 3.6 mg/dL — ABNORMAL HIGH (ref 0.0–0.3)
Total Bilirubin: 6.6 mg/dL — ABNORMAL HIGH (ref 0.2–1.2)
Total Protein: 6.9 g/dL (ref 6.0–8.3)

## 2023-05-27 LAB — CBC WITH DIFFERENTIAL/PLATELET
Basophils Absolute: 0.1 10*3/uL (ref 0.0–0.1)
Basophils Relative: 0.9 % (ref 0.0–3.0)
Eosinophils Absolute: 0.2 10*3/uL (ref 0.0–0.7)
Eosinophils Relative: 4 % (ref 0.0–5.0)
HCT: 31.2 % — ABNORMAL LOW (ref 39.0–52.0)
Hemoglobin: 10.8 g/dL — ABNORMAL LOW (ref 13.0–17.0)
Lymphocytes Relative: 24.3 % (ref 12.0–46.0)
Lymphs Abs: 1.5 10*3/uL (ref 0.7–4.0)
MCHC: 34.5 g/dL (ref 30.0–36.0)
MCV: 94.6 fl (ref 78.0–100.0)
Monocytes Absolute: 0.4 10*3/uL (ref 0.1–1.0)
Monocytes Relative: 7.2 % (ref 3.0–12.0)
Neutro Abs: 3.8 10*3/uL (ref 1.4–7.7)
Neutrophils Relative %: 63.6 % (ref 43.0–77.0)
Platelets: 529 10*3/uL — ABNORMAL HIGH (ref 150.0–400.0)
RBC: 3.29 Mil/uL — ABNORMAL LOW (ref 4.22–5.81)
RDW: 17.7 % — ABNORMAL HIGH (ref 11.5–15.5)
WBC: 6 10*3/uL (ref 4.0–10.5)

## 2023-05-27 LAB — BASIC METABOLIC PANEL WITH GFR
BUN: 11 mg/dL (ref 6–23)
CO2: 24 meq/L (ref 19–32)
Calcium: 9.1 mg/dL (ref 8.4–10.5)
Chloride: 100 meq/L (ref 96–112)
Creatinine, Ser: 0.82 mg/dL (ref 0.40–1.50)
GFR: 101.39 mL/min (ref >60.00)
Glucose, Bld: 154 mg/dL — ABNORMAL HIGH (ref 70–99)
Potassium: 3.5 meq/L (ref 3.5–5.1)
Sodium: 132 meq/L — ABNORMAL LOW (ref 135–145)

## 2023-05-27 NOTE — Telephone Encounter (Signed)
 PT returned call. Informed him of the prior message

## 2023-05-27 NOTE — Telephone Encounter (Signed)
 Left VM for patient informing him that FMLA paper work has been done and faxed.

## 2023-05-31 ENCOUNTER — Telehealth: Payer: Self-pay | Admitting: Gastroenterology

## 2023-05-31 NOTE — Telephone Encounter (Signed)
 These forms were given to [facility] last week.

## 2023-05-31 NOTE — Addendum Note (Signed)
 Addended by: Corine Dice on: 05/31/2023 08:51 AM   Modules accepted: Orders

## 2023-05-31 NOTE — Telephone Encounter (Signed)
 Patient called and stated that he is needing us  to fax over his short term disability as well as a list of his medications. Patient is requesting a call back. Please advise.

## 2023-06-01 ENCOUNTER — Other Ambulatory Visit (INDEPENDENT_AMBULATORY_CARE_PROVIDER_SITE_OTHER)

## 2023-06-01 DIAGNOSIS — D649 Anemia, unspecified: Secondary | ICD-10-CM

## 2023-06-01 DIAGNOSIS — K859 Acute pancreatitis without necrosis or infection, unspecified: Secondary | ICD-10-CM

## 2023-06-01 LAB — COMPREHENSIVE METABOLIC PANEL WITH GFR
ALT: 46 U/L (ref 0–53)
AST: 24 U/L (ref 0–37)
Albumin: 3.7 g/dL (ref 3.5–5.2)
Alkaline Phosphatase: 259 U/L — ABNORMAL HIGH (ref 39–117)
BUN: 12 mg/dL (ref 6–23)
CO2: 24 meq/L (ref 19–32)
Calcium: 9.2 mg/dL (ref 8.4–10.5)
Chloride: 106 meq/L (ref 96–112)
Creatinine, Ser: 0.73 mg/dL (ref 0.40–1.50)
GFR: 105 mL/min (ref >60.00)
Glucose, Bld: 102 mg/dL — ABNORMAL HIGH (ref 70–99)
Potassium: 4 meq/L (ref 3.5–5.1)
Sodium: 136 meq/L (ref 135–145)
Total Bilirubin: 4.4 mg/dL — ABNORMAL HIGH (ref 0.2–1.2)
Total Protein: 6.5 g/dL (ref 6.0–8.3)

## 2023-06-01 LAB — CBC WITH DIFFERENTIAL/PLATELET
Basophils Absolute: 0.1 10*3/uL (ref 0.0–0.1)
Basophils Relative: 0.8 % (ref 0.0–3.0)
Eosinophils Absolute: 0.3 10*3/uL (ref 0.0–0.7)
Eosinophils Relative: 3.5 % (ref 0.0–5.0)
HCT: 31 % — ABNORMAL LOW (ref 39.0–52.0)
Hemoglobin: 10.6 g/dL — ABNORMAL LOW (ref 13.0–17.0)
Lymphocytes Relative: 16.7 % (ref 12.0–46.0)
Lymphs Abs: 1.2 10*3/uL (ref 0.7–4.0)
MCHC: 34.2 g/dL (ref 30.0–36.0)
MCV: 95.9 fl (ref 78.0–100.0)
Monocytes Absolute: 0.5 10*3/uL (ref 0.1–1.0)
Monocytes Relative: 7 % (ref 3.0–12.0)
Neutro Abs: 5.3 10*3/uL (ref 1.4–7.7)
Neutrophils Relative %: 72 % (ref 43.0–77.0)
Platelets: 495 10*3/uL — ABNORMAL HIGH (ref 150.0–400.0)
RBC: 3.23 Mil/uL — ABNORMAL LOW (ref 4.22–5.81)
RDW: 18.4 % — ABNORMAL HIGH (ref 11.5–15.5)
WBC: 7.4 10*3/uL (ref 4.0–10.5)

## 2023-06-01 LAB — B12 AND FOLATE PANEL
Folate: >25.2 ng/mL (ref >5.9)
Vitamin B-12: 394 pg/mL (ref 211–911)

## 2023-06-01 LAB — LIPASE: Lipase: 129 U/L — ABNORMAL HIGH (ref 11.0–59.0)

## 2023-06-01 NOTE — Telephone Encounter (Signed)
 Blank forms received from [employer].  Will ask Evan Sullivan, front desk, to contact patient to come in and complete request form to indicate what he is asking for specifically and why,  so it can be given to Dr. Cherryl Corona for his consideration

## 2023-06-01 NOTE — Telephone Encounter (Signed)
 Forms placed on Evan Sullivan's desk to see if she has the forms Dr. Cherryl Corona completed earlier this month which may be helpful in completing them again.

## 2023-06-01 NOTE — Telephone Encounter (Signed)
 Called patient who states he is requesting new forms be completed to be out until he sees [place] on May 19th. He said [employer] faxed new forms yesterday several times but I cannot locate them in the office.Will send a MyChart message to patient with the fax number for [employer] to resend the request.

## 2023-06-01 NOTE — Telephone Encounter (Signed)
 Patient is calling back again wanting to speak to someone regarding his disability forms. Patient is requesting a call back today. Please advise.

## 2023-06-02 ENCOUNTER — Telehealth: Payer: Self-pay | Admitting: Gastroenterology

## 2023-06-02 ENCOUNTER — Other Ambulatory Visit: Payer: Self-pay

## 2023-06-02 NOTE — Transitions of Care (Post Inpatient/ED Visit) (Signed)
 Transition of Care week 3  Visit Note  06/02/2023  Name: Evan Sullivan MRN: [id number]          DOB: 22-Feb-1970  Situation: Patient enrolled in [facility] 30-day program. Visit completed with patient by telephone.   Background: Patient reports that he is doing ok. Denies any abdominal pain. No NVD. No fever. Eating well.  Sleeping well. Denies any new concerns or questions. States that he is managing well and denies need for additional calls. Patient reports that he has not had any changes to medications. Reports that he has his follow up appointments planned and denies any needs.   Initial Transition Care Management Follow-up Telephone Call    Past Medical History:  Diagnosis Date   Anemia    Arthritis    Avascular necrosis of femoral head, right (HCC) 2019   Diet-controlled type 2 diabetes mellitus (HCC) 09/07/2021   GERD (gastroesophageal reflux disease)    Gout    Hypertension    Peptic ulcer disease     Assessment: Patient Reported Symptoms: Cognitive Cognitive Status: Able to follow simple commands, Alert and oriented to person, place, and time, Normal speech and language skills      Neurological Neurological Review of Symptoms: Not assessed    HEENT HEENT Symptoms Reported: Not assessed      Cardiovascular Cardiovascular Symptoms Reported: Not assessed    Respiratory Respiratory Symptoms Reported: Not assesed    Endocrine Patient reports the following symptoms related to hypoglycemia or hyperglycemia : No symptoms reported Is patient diabetic?: Yes Is patient checking blood sugars at home?: No    Gastrointestinal Gastrointestinal Symptoms Reported: No symptoms reported Additional Gastrointestinal Details: Denies abdominal pain, denies NVD.  Reports that he is having daily bowel movement and takes Miralax  as needed.      Genitourinary Genitourinary Symptoms Reported: Not assessed    Integumentary Integumentary Symptoms Reported: Not assessed    Musculoskeletal  Musculoskelatal Symptoms Reviewed: Not assessed        Psychosocial Psychosocial Symptoms Reported: Not assessed         There were no vitals filed for this visit.  Medications Reviewed Today     Reviewed by Vanetta Generous, RN (Registered Nurse) on 06/02/23 at 1139  Med List Status: <None>   Medication Order Taking? Sig Documenting Provider Last Dose Status Informant  amLODipine  (NORVASC ) 10 MG tablet [id number] No TAKE 1 TABLET BY MOUTH EVERY DAY Laneta Pintos, MD Taking Active Self, Pharmacy Records  bisacodyl  (DULCOLAX) 5 MG EC tablet [id number] No Take 2 tablets (10 mg total) by mouth 2 (two) times daily as needed for moderate constipation or severe constipation. Maggie Schooner, MD Taking Active   colchicine  0.6 MG tablet [id number] No Take 1 capsule by mouth daily as needed (gout pain) McDonough, Lillie Reining, PA-C Taking Active Self, Pharmacy Records  CREON  36000-114000 units CPEP capsule [id number] No TAKE 2 CAPSULES (72,000 UNITS TOTAL) BY MOUTH WITH BREAKFAST, WITH LUNCH, AND WITH EVENING MEAL. Elois Hair, MD Taking Active Self, Pharmacy Records  diphenhydramine -acetaminophen  (TYLENOL  PM) 25-500 MG TABS tablet [id number] No Take 2 tablets by mouth at bedtime as needed (Sleep/Pain). [provider] Taking Active Self, Pharmacy Records  fenofibrate  (TRICOR ) 48 MG tablet [id number] No TAKE 1 TABLET BY MOUTH EVERY DAY Laneta Pintos, MD Taking Active Self, Pharmacy Records  folic acid  (FOLVITE ) 1 MG tablet [id number] No Take 1 tablet (1 mg total) by mouth daily. Maggie Schooner, MD Taking Active   hydrALAZINE  (APRESOLINE ) 50  MG tablet [id number] No Take 1 tablet (50 mg total) by mouth every 8 (eight) hours. Maggie Schooner, MD Taking Active   HYDROcodone -acetaminophen  (NORCO/VICODIN) 5-325 MG tablet [id number] No Take 1-2 tablets by mouth every 6 (six) hours as needed for moderate pain (pain score 4-6) or severe pain (pain score 7-10). Maggie Schooner, MD Taking Active   hydrocortisone   cream 1 % [id number] No Apply 1 application  topically daily as needed (rash). [provider] Taking Active Self, Pharmacy Records  isosorbide  dinitrate (ISORDIL ) 20 MG tablet [id number] No Take 1 tablet (20 mg total) by mouth 3 (three) times daily. Maggie Schooner, MD Taking Active   Multiple Vitamin (MULTIVITAMIN WITH MINERALS) TABS tablet [id number] No Take 1 tablet by mouth daily. Maggie Schooner, MD Taking Active   ondansetron  (ZOFRAN ) 4 MG tablet [id number] No Dissolve 1 tablet (4 mg total) by mouth daily as needed for nausea or vomiting. True Fuss, MD Taking Active Self, Pharmacy Records  pantoprazole  (PROTONIX ) 40 MG tablet [id number] No Take 1 tablet (40 mg total) by mouth 2 (two) times daily.  Patient not taking: Reported on 05/26/2023   Maggie Schooner, MD Not Taking Active            Med Note (ROSE, Baraa Tubbs U   Wed May 26, 2023 11:32 AM) Reports that he has stopped this medications because he states that it caused abdominal pain.   thiamine  (VITAMIN B-1) 100 MG tablet [id number] No Take 1 tablet (100 mg total) by mouth daily. Maggie Schooner, MD Taking Active             Recommendation:   PCP Follow-up  06/04/2023  Follow Up Plan:   Closing From:  Transitions of Care Program  Orpha Blade, RN, BSN, CEN [employer]- Transition of Care Team.  [facility] [phone]

## 2023-06-02 NOTE — Telephone Encounter (Signed)
 Patient called and stated that he needs to speak to Jan. Patient is requesting a call back. Please advise.

## 2023-06-02 NOTE — Telephone Encounter (Signed)
 Contacted patient to let get clarification on which form he needs filled out. Patient stated that the Short term disability paper work was what he needed filled out now. The second is for an office visit patient wants to know when he can return to work. Please Advise.

## 2023-06-03 NOTE — Telephone Encounter (Signed)
 PT is calling to find out if there was any update on the forms he needs for his clearance to go back to work. Please advise.

## 2023-06-03 NOTE — Telephone Encounter (Signed)
 Contacted patient and let him know that Dr.Cunningham has paper work and is planning to call and discuss it with him.

## 2023-06-04 ENCOUNTER — Ambulatory Visit (HOSPITAL_COMMUNITY)
Admission: RE | Admit: 2023-06-04 | Discharge: 2023-06-04 | Disposition: A | Discharge status: Home / Self Care | Source: Ambulatory Visit | Attending: Nurse Practitioner

## 2023-06-04 ENCOUNTER — Encounter: Payer: Self-pay | Admitting: Family Medicine

## 2023-06-04 ENCOUNTER — Other Ambulatory Visit: Payer: Self-pay | Admitting: *Deleted

## 2023-06-04 ENCOUNTER — Ambulatory Visit (INDEPENDENT_AMBULATORY_CARE_PROVIDER_SITE_OTHER): Admitting: Family Medicine

## 2023-06-04 VITALS — BP 122/70 | HR 70 | Ht 73.0 in | Wt 170.1 lb

## 2023-06-04 DIAGNOSIS — M1A9XX0 Chronic gout, unspecified, without tophus (tophi): Secondary | ICD-10-CM | POA: Diagnosis not present

## 2023-06-04 DIAGNOSIS — E119 Type 2 diabetes mellitus without complications: Secondary | ICD-10-CM | POA: Diagnosis not present

## 2023-06-04 DIAGNOSIS — K860 Alcohol-induced chronic pancreatitis: Secondary | ICD-10-CM

## 2023-06-04 DIAGNOSIS — E782 Mixed hyperlipidemia: Secondary | ICD-10-CM

## 2023-06-04 DIAGNOSIS — R7989 Other specified abnormal findings of blood chemistry: Secondary | ICD-10-CM | POA: Insufficient documentation

## 2023-06-04 DIAGNOSIS — N400 Enlarged prostate without lower urinary tract symptoms: Secondary | ICD-10-CM | POA: Diagnosis not present

## 2023-06-04 DIAGNOSIS — D649 Anemia, unspecified: Secondary | ICD-10-CM

## 2023-06-04 DIAGNOSIS — N281 Cyst of kidney, acquired: Secondary | ICD-10-CM | POA: Diagnosis not present

## 2023-06-04 DIAGNOSIS — E1169 Type 2 diabetes mellitus with other specified complication: Secondary | ICD-10-CM | POA: Diagnosis not present

## 2023-06-04 DIAGNOSIS — I728 Aneurysm of other specified arteries: Secondary | ICD-10-CM

## 2023-06-04 DIAGNOSIS — K859 Acute pancreatitis without necrosis or infection, unspecified: Secondary | ICD-10-CM | POA: Diagnosis not present

## 2023-06-04 MED ORDER — IOHEXOL 300 MG/ML  SOLN
100.0000 mL | Freq: Once | INTRAMUSCULAR | Status: AC | PRN
  Administered 2023-06-04: 100 mL via INTRAVENOUS

## 2023-06-04 MED ORDER — SODIUM CHLORIDE (PF) 0.9 % IJ SOLN
INTRAMUSCULAR | Status: AC
  Filled 2023-06-04: qty 50

## 2023-06-04 NOTE — Patient Instructions (Signed)
 It was nice to see you today,  We addressed the following topics today: -As far as your medications, you should be taking most of these. - The only medication we can try taking off right now is the hydralazine . - I want you to take half a tablet of the hydralazine  3 times a day for the next few days, then take half a tablet twice a day for 2 days, then stop completely. - During this time I want you to check your blood pressure at least twice a day and record the values, then bring them back to us    Have a great day,  Etha Henle, MD

## 2023-06-04 NOTE — Progress Notes (Unsigned)
 Established Patient Office Visit  Subjective   Patient ID: Evan Sullivan, male    DOB: September 06, 1970  Age: 53 y.o. MRN: [id number]  Chief Complaint  Patient presents with   Hospitalization Follow-up    HPI  Subjective: - Hospitalized twice in April. First week of April, then discharged Thursday, readmitted Sunday with severe stomach pain. Recently discharged last week. - Reports blood pressure medications changed in hospital - discontinued atenolol , added hydralazine  and isosorbide  (both TID). - Taking vitamins including thiamine , folic acid , and OTC multivitamin. - Not taking pantoprazole  daily - only as needed when experiencing gas/stomach pain. Reports stomach pain when taking without food. - Taking Miralax  as needed, not daily. - Still taking Creon  TID with meals (started by Dr. Cherryl Corona in January). - Home BP readings: highest 135-138, lowest 110-115, usually 120-122. - Reports possible gout flare in toe recently - first flare since stopping alcohol. - No alcohol use in over a year. - Out of work since end of March. Work note states to remain out until GI appointment (06/21/2023). 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   The 10-year ASCVD risk score (Arnett DK, et al., 2019) is: 14.1%  Health Maintenance Due  Topic Date Due   Hepatitis C Screening  Never done   DTaP/Tdap/Td (1 - Tdap) Never done   Pneumococcal Vaccine 37-42 Years old (1 of 2 - PCV) Never done   Zoster Vaccines- Shingrix (1 of 2) Never done   COVID-19 Vaccine (3 - 2024-25 season) 10/04/2022   Diabetic kidney evaluation - Urine ACR  03/26/2023   OPHTHALMOLOGY EXAM  04/09/2023      Objective:     BP 122/70   Pulse 70   Ht 6\' 1"  (1.854 m)   Wt 170 lb 2.2 oz (77.2 kg)   SpO2 100%   BMI 22.45 kg/m    Physical Exam General: Alert, oriented CV: Regular rhythm Pulmonary: Lungs clear bilaterally GI: Soft, nontender.  Normal bowel sounds Extremities: No  pedal edema     Assessment & Plan:   Alcohol-induced chronic pancreatitis (HCC) Assessment & Plan: Continue Creon .  Has upcoming follow-up with gastroenterology.  Recheck CMP with [facility]GT direct bili.  Continues to abstain from alcohol.  Orders: -     Gamma GT -     Bilirubin, direct -     Comprehensive metabolic panel with GFR  Mixed diabetic hyperlipidemia associated with type 2 diabetes mellitus (HCC) Assessment & Plan: Rechecking lipid panel.  Taking fenofibrate  for the past 5 years approximately.  Possibly contributing to his issues with recurrent pancreatitis?  The highest his triglycerides have been in his chart were 400 prior to his initiation of the medication.  Consider stopping and rechecking at next visit depending on lipid panel today.  Would consider trying omega-3 fatty acids instead if needing alternative to fibrate.   Orders: -     Lipid panel  Type 2 diabetes mellitus without complication, without long-term current use of insulin  (HCC) Assessment & Plan: Has been diet controlled in the past.  Slightly elevated during his hospitalization, likely secondary to acute illness and any steroids he may have been given.  Recheck in 2 months.   Chronic gout without tophus, unspecified cause, unspecified site  Anemia, unspecified type -     Iron , TIBC and Ferritin Panel   I spent 33 min in the mgmt of this patient  Return in about 10 weeks (around 08/13/2023) for DM, pancreatitis.  Laneta Pintos, MD

## 2023-06-05 LAB — COMPREHENSIVE METABOLIC PANEL WITH GFR
ALT: 33 IU/L (ref 0–44)
AST: 22 IU/L (ref 0–40)
Albumin: 4.2 g/dL (ref 3.8–4.9)
Alkaline Phosphatase: 296 IU/L — ABNORMAL HIGH (ref 44–121)
BUN/Creatinine Ratio: 15 (ref 9–20)
BUN: 11 mg/dL (ref 6–24)
Bilirubin Total: 3.4 mg/dL — ABNORMAL HIGH (ref 0.0–1.2)
CO2: 17 mmol/L — ABNORMAL LOW (ref 20–29)
Calcium: 9.6 mg/dL (ref 8.7–10.2)
Chloride: 104 mmol/L (ref 96–106)
Creatinine, Ser: 0.73 mg/dL — ABNORMAL LOW (ref 0.76–1.27)
Globulin, Total: 2.9 g/dL (ref 1.5–4.5)
Glucose: 151 mg/dL — ABNORMAL HIGH (ref 70–99)
Potassium: 3.9 mmol/L (ref 3.5–5.2)
Sodium: 139 mmol/L (ref 134–144)
Total Protein: 7.1 g/dL (ref 6.0–8.5)
eGFR: 110 mL/min/{1.73_m2} (ref >59)

## 2023-06-05 LAB — BILIRUBIN, DIRECT: Bilirubin, Direct: 2.76 mg/dL — ABNORMAL HIGH (ref 0.00–0.40)

## 2023-06-05 LAB — IRON,TIBC AND FERRITIN PANEL
Ferritin: 222 ng/mL (ref 30–400)
Iron Saturation: 16 % (ref 15–55)
Iron: 63 ug/dL (ref 38–169)
Total Iron Binding Capacity: 388 ug/dL (ref 250–450)
UIBC: 325 ug/dL (ref 111–343)

## 2023-06-05 LAB — LIPID PANEL
Chol/HDL Ratio: 2.9 ratio (ref 0.0–5.0)
Cholesterol, Total: 179 mg/dL (ref 100–199)
HDL: 61 mg/dL (ref >39)
LDL Chol Calc (NIH): 97 mg/dL (ref 0–99)
Triglycerides: 117 mg/dL (ref 0–149)
VLDL Cholesterol Cal: 21 mg/dL (ref 5–40)

## 2023-06-05 LAB — GAMMA GT: GGT: 614 IU/L — ABNORMAL HIGH (ref 0–65)

## 2023-06-06 DIAGNOSIS — M1A9XX0 Chronic gout, unspecified, without tophus (tophi): Secondary | ICD-10-CM | POA: Insufficient documentation

## 2023-06-06 NOTE — Assessment & Plan Note (Addendum)
 Rechecking lipid panel.  Taking fenofibrate  for the past 5 years approximately.  Possibly contributing to his issues with recurrent pancreatitis?  The highest his triglycerides have been in his chart were 400 prior to his initiation of the medication.  Consider stopping and rechecking at next visit depending on lipid panel today.  Would consider trying omega-3 fatty acids instead if needing alternative to fibrate.

## 2023-06-06 NOTE — Assessment & Plan Note (Signed)
 Continue Creon .  Has upcoming follow-up with gastroenterology.  Recheck CMP with GGT direct bili.  Continues to abstain from alcohol.

## 2023-06-06 NOTE — Assessment & Plan Note (Signed)
 Has been diet controlled in the past.  Slightly elevated during his hospitalization, likely secondary to acute illness and any steroids he may have been given.  Recheck in 2 months.

## 2023-06-07 ENCOUNTER — Other Ambulatory Visit: Payer: Self-pay | Admitting: Radiology

## 2023-06-07 ENCOUNTER — Other Ambulatory Visit: Payer: Self-pay | Admitting: Interventional Radiology

## 2023-06-07 ENCOUNTER — Telehealth: Payer: Self-pay | Admitting: Nurse Practitioner

## 2023-06-07 DIAGNOSIS — K859 Acute pancreatitis without necrosis or infection, unspecified: Secondary | ICD-10-CM

## 2023-06-07 DIAGNOSIS — I728 Aneurysm of other specified arteries: Secondary | ICD-10-CM

## 2023-06-07 NOTE — Telephone Encounter (Signed)
 Patient calling in regards to previous note. Please advise.   Thank you

## 2023-06-07 NOTE — Telephone Encounter (Signed)
 Refer to CTAP  06/04/2023 result notes. 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 CT reviewed by Dr. Cherryl Corona, patient initially referred to vascular surgery. Case was reviewed by vascular surgeon Dr Rosalva Comber who said this would be better suited to go to IR.   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  THX

## 2023-06-07 NOTE — Addendum Note (Signed)
 Addended by: Laneta Pintos on: 06/07/2023 05:13 PM   Modules accepted: Level of Service

## 2023-06-07 NOTE — Telephone Encounter (Signed)
 IR Consult placed at [facility]. I have spoken to [facility] with IR (phone [phone]) to advise of urgent request. She verbalizes understanding and will work on getting patient an appointment.

## 2023-06-07 NOTE — Telephone Encounter (Signed)
 Patient no longer has questions. He states that he was calling to follow up on vascular appointment but just recently talked to interventional radiology who indicated they will be doing a procedure rather than vascular specialist. Appt scheduled for 06/09/23.

## 2023-06-08 ENCOUNTER — Ambulatory Visit
Admission: RE | Admit: 2023-06-08 | Discharge: 2023-06-08 | Disposition: A | Discharge status: Home / Self Care | Source: Ambulatory Visit | Attending: Nurse Practitioner | Admitting: Nurse Practitioner

## 2023-06-08 ENCOUNTER — Other Ambulatory Visit: Payer: Self-pay | Admitting: Radiology

## 2023-06-08 DIAGNOSIS — I728 Aneurysm of other specified arteries: Secondary | ICD-10-CM

## 2023-06-08 DIAGNOSIS — K861 Other chronic pancreatitis: Secondary | ICD-10-CM | POA: Diagnosis not present

## 2023-06-08 DIAGNOSIS — K859 Acute pancreatitis without necrosis or infection, unspecified: Secondary | ICD-10-CM

## 2023-06-08 HISTORY — PX: IR RADIOLOGIST EVAL & MGMT: IMG5224

## 2023-06-08 NOTE — Progress Notes (Signed)
 Dr. Cherryl Corona, [facility], patient was seen by IR today. Dottie, excellent work getting his IR evaluation set up so promptly.  Greatly appreciated.

## 2023-06-08 NOTE — Telephone Encounter (Signed)
 Excellent

## 2023-06-08 NOTE — Consult Note (Signed)
 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 male with a history of chronic pancreatitis.  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   He presents today with his partner.  He is doing well and denies hematemesis, syncope, new abdominal pain or other acute symptoms.  He is nervous about this new diagnosis and our upcoming procedure.   Past Medical History:  Diagnosis Date   Anemia    Arthritis    Avascular necrosis of femoral head, right (HCC) 2019   Diet-controlled type 2 diabetes mellitus (HCC) 09/07/2021   GERD (gastroesophageal reflux disease)    Gout    Hypertension    Peptic ulcer disease     Past Surgical History:  Procedure Laterality Date   COLONOSCOPY     COLONOSCOPY WITH PROPOFOL  N/A 11/30/2019   Procedure: COLONOSCOPY WITH PROPOFOL ;  Surgeon: Selena Daily, MD;  Location: [facility];  Service: Endoscopy;  Laterality: N/A;  priority 4   IR RADIOLOGIST EVAL & MGMT  06/08/2023   KNEE ARTHROSCOPY, MEDIAL PATELLO FEMORAL LIGAMENT REPAIR Bilateral 2004,2000   POLYPECTOMY  11/30/2019   Procedure: POLYPECTOMY;  Surgeon: Selena Daily, MD;  Location: [facility];  Service: Endoscopy;;   TOTAL HIP ARTHROPLASTY Right 08/02/2017   Procedure: TOTAL HIP ARTHROPLASTY;  Surgeon: Arlyne Lame, MD;  Location: [facility];  Service: Orthopedics;  Laterality: Right;    Allergies: Patient has no known allergies.  Medications: Prior to Admission medications   Medication Sig Start Date End Date Taking? Authorizing Provider  amLODipine  (NORVASC ) 10 MG tablet TAKE 1 TABLET BY MOUTH EVERY DAY 04/19/23  Yes Laneta Pintos, MD  bisacodyl  (DULCOLAX) 5 MG EC tablet Take 2 tablets (10 mg total) by mouth 2 (two) times daily as needed for moderate constipation or severe constipation. 05/20/23  Yes Amin, Ankit C, MD  colchicine  0.6 MG tablet Take 1 capsule by mouth daily as needed (gout pain) 06/26/20  Yes McDonough, Lauren K, PA-C  CREON  36000-114000 units CPEP capsule TAKE 2 CAPSULES (72,000 UNITS TOTAL) BY MOUTH WITH BREAKFAST, WITH LUNCH, AND WITH EVENING MEAL. 04/14/23  Yes Elois Hair, MD  fenofibrate  (TRICOR ) 48 MG tablet TAKE 1 TABLET BY MOUTH EVERY DAY 03/25/23  Yes Laneta Pintos, MD  folic acid  (FOLVITE ) 1 MG tablet Take 1 tablet (1 mg total) by mouth daily. 05/21/23  Yes Amin, Ankit C, MD  isosorbide  dinitrate (ISORDIL ) 20 MG tablet Take 1 tablet (20 mg total) by mouth 3 (three) times daily. 05/20/23  Yes Amin, Ankit C, MD  Multiple Vitamin (MULTIVITAMIN WITH MINERALS) TABS tablet Take 1 tablet by mouth daily. 05/21/23  Yes Amin, Ankit C, MD  pantoprazole  (PROTONIX ) 40 MG tablet Take 1 tablet (40 mg total) by mouth 2 (two) times daily. 05/20/23  Yes Amin, Ankit C, MD  thiamine  (VITAMIN B-1) 100 MG tablet Take 1 tablet (100 mg total) by mouth daily. 05/21/23  Yes Amin, Ankit C, MD  diphenhydramine -acetaminophen  (TYLENOL  PM) 25-500 MG TABS tablet Take 2 tablets by mouth at bedtime as needed (Sleep/Pain). Patient not taking: Reported on 06/08/2023    [provider]  hydrALAZINE  (APRESOLINE ) 50 MG tablet Take 1 tablet (50 mg total) by mouth every 8 (eight)  hours. Patient not taking: Reported on 06/08/2023 05/20/23   Maggie Schooner, MD  HYDROcodone -acetaminophen  (NORCO/VICODIN) 5-325 MG tablet Take 1-2 tablets by mouth every 6 (six) hours as needed for moderate pain (pain score 4-6) or severe pain (pain score 7-10). Patient not taking: Reported on 06/08/2023 05/20/23   Maggie Schooner, MD  hydrocortisone  cream 1 % Apply 1 application  topically daily as needed (rash). Patient not taking: Reported on 06/08/2023     [provider]  ondansetron  (ZOFRAN ) 4 MG tablet Dissolve 1 tablet (4 mg total) by mouth daily as needed for nausea or vomiting. Patient not taking: Reported on 06/08/2023 05/06/23 05/05/24  True Fuss, MD     Family History  Problem Relation Age of Onset   Hypertension Mother    Hypertension Father    Colon cancer Father 38   Diabetes Father    Pancreatic cancer Neg Hx    Esophageal cancer Neg Hx    Liver cancer Neg Hx    Stomach cancer Neg Hx     Social History   Socioeconomic History   Marital status: Single    Spouse name: Not on file   Number of children: Not on file   Years of education: Not on file   Highest education level: Not on file  Occupational History   Not on file  Tobacco Use   Smoking status: Never   Smokeless tobacco: Never  Vaping Use   Vaping status: Never Used  Substance and Sexual Activity   Alcohol use: Not Currently   Drug use: Yes    Types: Marijuana    Comment: daily    Sexual activity: Yes    Birth control/protection: Condom  Other Topics Concern   Not on file  Social History Narrative   Not on file   Social Drivers of Health   Financial Resource Strain: Unknown (04/23/2017)   Received from [facility], [facility]   Overall Financial Resource Strain (CARDIA)    Difficulty of Paying Living Expenses: Patient declined  Food Insecurity: No Food Insecurity (05/21/2023)   Hunger Vital Sign    Worried About Running Out of Food in the Last Year: Never true    Ran Out of Food in the Last Year: Never true  Transportation Needs: No Transportation Needs (05/21/2023)   PRAPARE - Administrator, Civil Service (Medical): No    Lack of Transportation (Non-Medical): No  Physical Activity: Unknown (04/23/2017)   Received from [facility], [facility]   Exercise Vital Sign    Days of Exercise per Week: Patient declined    Minutes of Exercise per Session:  Patient declined  Stress: Unknown (04/23/2017)   Received from [facility], [facility]   [employer] of Occupational Health - Occupational Stress Questionnaire    Feeling of Stress : Patient declined  Social Connections: Unknown (04/23/2017)   Received from [facility], [facility]   Social Connection and Isolation Panel [NHANES]    Frequency of Communication with Friends and Family: Patient declined    Frequency of Social Gatherings with Friends and Family: Patient declined    Attends Religious Services: Patient declined    Active Member of Clubs or Organizations: Patient declined    Attends Banker Meetings: Patient declined    Marital Status: Patient declined    Review of Systems: A 12 point ROS discussed and pertinent positives are  indicated in the HPI above.  All other systems are negative.  Review of Systems  Vital Signs: BP 132/85 (BP Location: Right Arm, Patient Position: Sitting, Cuff Size: Normal)   Pulse 67   Temp 98.1 F (36.7 C) (Oral)   Resp 16   SpO2 96%     Physical Exam Constitutional:      Appearance: Normal appearance. He is normal weight.  HENT:     Head: Normocephalic and atraumatic.  Eyes:     General: No scleral icterus. Cardiovascular:     Rate and Rhythm: Normal rate.  Pulmonary:     Effort: Pulmonary effort is normal.  Abdominal:     General: There is no distension.     Palpations: Abdomen is soft.     Tenderness: There is no abdominal tenderness. There is no guarding.  Skin:    General: Skin is warm and dry.  Neurological:     Mental Status: He is alert and oriented to person, place, and time.  Psychiatric:        Mood and Affect: Mood normal.        Behavior: Behavior normal.       Imaging: IR Radiologist Eval & Mgmt Result Date: 06/08/2023 EXAM: NEW PATIENT OFFICE VISIT CHIEF COMPLAINT: SEE EPIC NOTE HISTORY OF PRESENT ILLNESS: SEE EPIC NOTE  REVIEW OF SYSTEMS: SEE EPIC NOTE PHYSICAL EXAMINATION: SEE EPIC NOTE ASSESSMENT AND PLAN: SEE EPIC NOTE Electronically Signed   By: Fernando Hoyer M.D.   On: 06/08/2023 11:18   CT ABDOMEN PELVIS W CONTRAST Result Date: 06/04/2023 EXAMINATION: CT ABDOMEN PELVIS W CONTRAST CLINICAL INDICATION: Male, 53 years old. F/U pancreatitis TECHNIQUE: Axial CT of the abdomen and pelvis with 100 cc Omnipaque  300 intravenous contrast. Multiplanar reformations provided. Unless otherwise specified, incidental thyroid , adrenal, renal lesions do not require dedicated imaging follow up. Additionally, any mentioned pulmonary nodules do not require dedicated imaging follow-up based on the Fleischner guidelines unless otherwise specified. Coronary calcifications are not identified unless otherwise specified. COMPARISON: 05/16/2023 FINDINGS: The lung bases are clear. The heart is normal in size. The liver contains a subcentimeter probable cyst. The gallbladder is normal. The spleen is normal. 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 There is interval growth of a pseudoaneurysm which appears to originate from a pancreaticoduodenal branch of the GDA now measuring 2.8 cm (previously measuring 10 mm). The adrenals are normal. Left kidney is normal. Right renal cysts are seen. The abdominal aorta is normal in caliber. Scattered atherosclerotic changes are present. Bladder is normal. The prostate is enlarged. Large and small bowel loops are otherwise within normal limits. There is no free fluid or pathologic lymphadenopathy by size criteria. Right hip arthroplasty is noted. There are degenerative changes of the spine and bony pelvis. IMPRESSION: Persistent findings of acute on chronic interstitial pancreatitis involving the pancreatic head with significant interval growth of a pseudoaneurysm which appears to originate from a pancreaticoduodenal branch of the GDA which now measures 2.8  cm. Prompt referral to a vascular specialist is recommended. DOSE REDUCTION: This exam was performed according to our departmental dose-optimization program which includes automated exposure control, adjustment of the mA and/or kV according to patient size and/or use of iterative reconstruction technique. Electronically signed by: Italy Engel MD 06/04/2023 11:48 AM EDT RP Workstation: [id number]   DG Abd 1 View Result Date: 05/20/2023 CLINICAL DATA:  Abdominal distension.  Constipation. EXAM: ABDOMEN - 1 VIEW COMPARISON:  05/18/2023 FINDINGS: No gaseous small bowel  dilatation. Air in stool are seen scattered along the length of a mildly distended colon. Dystrophic calcification seen in the region of the head of the pancreas, better characterized by recent CT abdomen/pelvis. Status post right hip replacement. IMPRESSION: Mildly distended colon with air and stool. No gaseous small bowel dilatation. Electronically Signed   By: Donnal Fusi M.D.   On: 05/20/2023 09:20   DG Abd 2 Views Result Date: 05/18/2023 CLINICAL DATA:  Vomiting, abdominal pain EXAM: ABDOMEN - 2 VIEW COMPARISON:  06/20/2019 FINDINGS: The bowel gas pattern is normal. There is no evidence of free air. No radio-opaque calculi or other significant radiographic abnormality is seen. IMPRESSION: Negative. Electronically Signed   By: Janeece Mechanic M.D.   On: 05/18/2023 18:38   MR ABDOMEN MRCP W WO CONTAST Result Date: 05/16/2023 CLINICAL DATA:  Follow-up pancreatitis. EXAM: MRI ABDOMEN WITHOUT AND WITH CONTRAST (INCLUDING MRCP) TECHNIQUE: Multiplanar multisequence MR imaging of the abdomen was performed both before and after the administration of intravenous contrast. Heavily T2-weighted images of the biliary and pancreatic ducts were obtained, and three-dimensional MRCP images were rendered by post processing. CONTRAST:  8mL GADAVIST  GADOBUTROL  1 MMOL/ML IV SOLN COMPARISON:  CT scan, same date.  Prior MRI abdomen 05/03/2023 FINDINGS: Lower chest:  The lung bases are clear of an acute process. No pleural or pericardial effusion. Hepatobiliary: Stable appearing moderate intrahepatic biliary dilatation. There is also persistent significant common bile duct dilatation in the porta hepatis. The common bile duct is markedly compressed and becomes quite narrow in the upper head of the pancreas. This is likely due to significant acute on chronic pancreatitis. No hepatic lesions are identified. The portal and hepatic veins are patent. The gallbladder is distended and contains layering high T1 material which is likely inspissated bile. There is also some layering sludge or small gallstones. Mild pericholecystic fluid likely related to the patient's pancreatitis. No common bile duct stones are identified. Pancreas: Significant inflammation/edema involving the head of the pancreas along with peripancreatic and periduodenal fluid. Persistent multiple intrapancreatic pseudocysts and extensive pancreatic calcifications, better seen on the CT scan. Marked stable dilatation of the main pancreatic duct. Stable moderate atrophy of the body and tail region of the pancreas. Spleen:  Normal size.  No focal lesions. Adrenals/Urinary Tract: The adrenal glands and kidneys are unremarkable and stable. Stomach/Bowel: There is significant inflammation of the second and third portions of the duodenum surrounded by inflammatory phlegmon and fluid. Is moderate enhancement of the duodenal wall but no mechanical or functional obstruction. Stomach is unremarkable. The visualized small bowel colon are grossly normal. Vascular/Lymphatic: No pathologically enlarged lymph nodes identified. No abdominal aortic aneurysm demonstrated. The portal and splenic veins are patent. The SMV is patent. Other: Small amount of fluid surrounding the pancreatic head and duodenum and also a small amount of fluid in the right anterior pararenal space but no overt ascites. Musculoskeletal: No significant bony  findings. IMPRESSION: 1. Stable appearing moderate intrahepatic biliary dilatation and significant common bile duct dilatation in the porta hepatis. The common bile duct is markedly compressed and becomes quite narrow in the upper head of the pancreas. This is likely due to significant acute on chronic pancreatitis. Findings similar to prior MRI. 2. Distended gallbladder containing layering high T1 material which is likely inspissated bile. There is also some layering sludge or small gallstones. Mild pericholecystic fluid likely related to the patient's pancreatitis. No common bile duct stones are identified. 3. Significant inflammation/edema involving the head of the pancreas along with peripancreatic  and periduodenal fluid. Persistent multiple intrapancreatic pseudocysts and extensive pancreatic calcifications, better seen on the CT scan. Marked stable dilatation of the main pancreatic duct. Stable moderate atrophy of the body and tail region of the pancreas. 4. Significant inflammation of the second and third portions of the duodenum surrounded by inflammatory phlegmon and fluid. No mechanical or functional obstruction. Electronically Signed   By: Marrian Siva M.D.   On: 05/16/2023 22:06   CT Angio Chest PE W/Cm &/Or Wo Cm Result Date: 05/16/2023 CLINICAL DATA:  Pulmonary embolism (PE) suspected, high prob. Hemoptysis. EXAM: CT ANGIOGRAPHY CHEST WITH CONTRAST TECHNIQUE: Multidetector CT imaging of the chest was performed using the standard protocol during bolus administration of intravenous contrast. Multiplanar CT image reconstructions and MIPs were obtained to evaluate the vascular anatomy. RADIATION DOSE REDUCTION: This exam was performed according to the departmental dose-optimization program which includes automated exposure control, adjustment of the mA and/or kV according to patient size and/or use of iterative reconstruction technique. CONTRAST:  75mL OMNIPAQUE  IOHEXOL  350 MG/ML SOLN COMPARISON:   10/15/2012 FINDINGS: Cardiovascular: No filling defects in the pulmonary arteries to suggest pulmonary emboli. Heart is normal size. Aorta is normal caliber. Mediastinum/Nodes: No mediastinal, hilar, or axillary adenopathy. Trachea and esophagus are unremarkable. Thyroid  unremarkable. Lungs/Pleura: No confluent opacities or effusions. Upper Abdomen: See report from abdominal CT today. Musculoskeletal: Chest wall soft tissues are unremarkable. No acute bony abnormality. Review of the MIP images confirms the above findings. IMPRESSION: No evidence of pulmonary embolus. No acute cardiopulmonary disease. Electronically Signed   By: Janeece Mechanic M.D.   On: 05/16/2023 21:25   CT ABDOMEN PELVIS W CONTRAST Result Date: 05/16/2023 CLINICAL DATA:  Abdominal pain and known pancreatitis EXAM: CT ABDOMEN AND PELVIS WITH CONTRAST TECHNIQUE: Multidetector CT imaging of the abdomen and pelvis was performed using the standard protocol following bolus administration of intravenous contrast. RADIATION DOSE REDUCTION: This exam was performed according to the departmental dose-optimization program which includes automated exposure control, adjustment of the mA and/or kV according to patient size and/or use of iterative reconstruction technique. CONTRAST:  OMNIPAQUE  IOHEXOL  300 MG/ML  SOLN COMPARISON:  MRI from 05/03/2023, CT from 05/03/2023 FINDINGS: Lower chest: No acute abnormality. Hepatobiliary: Liver is well visualized with mild intra hepatic ductal dilatation similar to that seen on the prior exam. Gallbladder is well distended without evidence of cholelithiasis. The common bile duct is prominent as is the pancreatic duct secondary to changes of prior pancreatitis in the region of the pancreatic head. Pancreas: Pancreas demonstrates scattered calcifications consistent with chronic pancreatitis. Dilatation of the pancreatic duct is again seen. The head and uncinate process are enlarged but relatively stable from the prior  exam. Slight increase in the degree of peripancreatic inflammatory changes noted consistent with an acute on chronic component. Spleen: Normal in size without focal abnormality. Adrenals/Urinary Tract: Adrenal glands are within normal limits. Kidneys demonstrate a normal enhancement pattern bilaterally. No renal calculi or obstructive changes are noted. The bladder is within normal limits. Stomach/Bowel: No obstructive or inflammatory changes of the colon are seen. The appendix is within normal limits. Small bowel and stomach are unremarkable with the exception of inflammatory changes in the second portion the duodenum related to the adjacent pancreatitis. These changes are similar in appearance. Vascular/Lymphatic: Aortic atherosclerosis. No enlarged abdominal or pelvic lymph nodes. Reproductive: Prostate is unremarkable. Other: No abdominal wall hernia or abnormality. No abdominopelvic ascites. Musculoskeletal: Right hip replacement is noted. Mild degenerative changes of lumbar spine are seen. IMPRESSION: Changes consistent  with acute on chronic pancreatitis with slight increase in the degree of peripancreatic inflammatory change when compared with the prior exam. This causes compression upon the central pancreatic duct and common bile duct with dilatation of both peripherally. Ductal changes are stable in appearance. No other focal abnormality is noted. Electronically Signed   By: Violeta Grey M.D.   On: 05/16/2023 20:25   DG Chest 2 View Result Date: 05/16/2023 CLINICAL DATA:  Hemoptysis EXAM: CHEST - 2 VIEW COMPARISON:  Chest CT report only 10/20/2012 FINDINGS: The heart size and mediastinal contours are within normal limits. Both lungs are clear. The visualized skeletal structures are unremarkable. IMPRESSION: No active cardiopulmonary disease. Electronically Signed   By: Tyron Gallon M.D.   On: 05/16/2023 16:06    Labs:  CBC: Recent Labs    05/19/23 0501 05/20/23 0536 05/27/23 1056  06/01/23 0923  WBC 5.9 6.4 6.0 7.4  HGB 11.1* 11.5* 10.8* 10.6*  HCT 32.4* 34.7* 31.2* 31.0*  PLT 363 338 529.0* 495.0*    COAGS: Recent Labs    05/16/23 1927  INR 1.1    BMP: Recent Labs    05/17/23 0506 05/18/23 0458 05/19/23 0501 05/20/23 0536 05/27/23 1056 06/01/23 0923 06/04/23 0948  NA 135 132* 134* 133* 132* 136 139  K 4.0 3.8 3.7 3.4* 3.5 4.0 3.9  CL 101 100 100 102 100 106 104  CO2 24 23 23  18* 24 24 17*  GLUCOSE 134* 133* 114* 167* 154* 102* 151*  BUN 8 9 12 13 11 12 11   CALCIUM 9.0 9.1 9.4 9.3 9.1 9.2 9.6  CREATININE 0.65 0.63 0.83 0.61 0.82 0.73 0.73*  GFRNONAA >60 >60 >60 >60  --   --   --     LIVER FUNCTION TESTS: Recent Labs    05/20/23 0536 05/27/23 1056 06/01/23 0923 06/04/23 0948  BILITOT 14.1* 6.6* 4.4* 3.4*  AST 118* 65* 24 22  ALT 202* 128* 46 33  ALKPHOS 480* 454* 259* 296*  PROT 6.6 6.9 6.5 7.1  ALBUMIN 2.9* 3.8 3.7 4.2    TUMOR MARKERS: Recent Labs    03/04/23 1608  CA199 77*    Assessment and Plan:  Pleasant 53 yo gentleman with a history of chronic pancreatitis.  Recent imaging demonstrates an enlarging pseudoaneurysm measuring up to 2.8 cm.  This is a critical finding and a risk for spontaneous rupture.  I explained to him and his partner the pathophysiology of this process and also the need to treat to prevent life threatening hemorrhage and/or death.   I described the procedure of angiography and coil embolization in detail and he voiced his understanding.  Time was given for questions and all questions were answered.   1.) He is scheduled for urgent angiogram and embolization of the pancreaticoduodenal pseudoaneurysm tomorrow at [facility].   Thank you for this interesting consult.  I greatly enjoyed meeting Sahej A Broccoli and look forward to participating in their care.  A copy of this report was sent to the requesting provider on this date.  Electronically Signed: Roxie Cord 06/08/2023, 2:02 PM   I spent a total of  40 Minutes  in face to face in clinical consultation, greater than 50% of which was counseling/coordinating care for pancreaticoduodenal arterial pseudoaneurysm

## 2023-06-08 NOTE — Progress Notes (Signed)
 Patient for IR Pseudoaneurysm angiogram and poss embolization on Wed 06/09/23, I called and spoke with the patient on the phone and gave pre-procedure instructions. Pt was made aware to be here at 12p, NPO after MN prior to procedure as well as driver post procedure/recovery/discharge. Pt stated understanding.  Called 06/07/23

## 2023-06-08 NOTE — H&P (Signed)
 Chief Complaint: Pancreatic Artery Pseudoaneurysm - IR consulted for image guided embolization  Referring Provider(s): Tory Freiberg, NP   Supervising Physician: Fernando Hoyer  Patient Status: [facility] - Out-pt  History of Present Illness: Evan Sullivan is a 53 y.o. male with hx of DM2, HTN, HLD, GERD, prior recurrent alcohol induced acute pancreatitis, now with chronic pancreatitis. 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 Recent hospitalization for pancreatitis flare with discharge on 4/17. 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 Prompt referral to a vascular specialist is recommended.  Pt was initially referred to vascular surgery who recommended pt be referred to IR for better definitive therapy with embolization.   Pt without complaint today.   Patient is Full Code  Past Medical History:  Diagnosis Date   Anemia    Arthritis    Avascular necrosis of femoral head, right (HCC) 2019   Diet-controlled type 2 diabetes mellitus (HCC) 09/07/2021   GERD (gastroesophageal reflux disease)    Gout    Hypertension    Peptic ulcer disease     Past Surgical History:  Procedure Laterality Date   COLONOSCOPY     COLONOSCOPY WITH PROPOFOL  N/A 11/30/2019   Procedure: COLONOSCOPY WITH PROPOFOL ;  Surgeon: Selena Daily, MD;  Location: [facility];  Service: Endoscopy;  Laterality: N/A;  priority 4   KNEE ARTHROSCOPY, MEDIAL PATELLO FEMORAL LIGAMENT REPAIR Bilateral 2004,2000   POLYPECTOMY  11/30/2019   Procedure: POLYPECTOMY;  Surgeon: Selena Daily, MD;  Location: [facility];  Service: Endoscopy;;   TOTAL HIP ARTHROPLASTY Right  08/02/2017   Procedure: TOTAL HIP ARTHROPLASTY;  Surgeon: Arlyne Lame, MD;  Location: [facility];  Service: Orthopedics;  Laterality: Right;    Allergies: Patient has no known allergies.  Medications: Prior to Admission medications   Medication Sig Start Date End Date Taking? Authorizing Provider  amLODipine  (NORVASC ) 10 MG tablet TAKE 1 TABLET BY MOUTH EVERY DAY 04/19/23   Laneta Pintos, MD  bisacodyl  (DULCOLAX) 5 MG EC tablet Take 2 tablets (10 mg total) by mouth 2 (two) times daily as needed for moderate constipation or severe constipation. 05/20/23   Maggie Schooner, MD  colchicine  0.6 MG tablet Take 1 capsule by mouth daily as needed (gout pain) 06/26/20   McDonough, Lauren K, PA-C  CREON  36000-114000 units CPEP capsule TAKE 2 CAPSULES (72,000 UNITS TOTAL) BY MOUTH WITH BREAKFAST, WITH LUNCH, AND WITH EVENING MEAL. 04/14/23   Elois Hair, MD  diphenhydramine -acetaminophen  (TYLENOL  PM) 25-500 MG TABS tablet Take 2 tablets by mouth at bedtime as needed (Sleep/Pain). Patient not taking: Reported on 06/08/2023    [provider]  fenofibrate  (TRICOR ) 48 MG tablet TAKE 1 TABLET BY MOUTH EVERY DAY 03/25/23   Laneta Pintos, MD  folic acid  (FOLVITE ) 1 MG tablet Take 1 tablet (1 mg total) by mouth daily. 05/21/23   Amin, Ankit C, MD  hydrALAZINE  (APRESOLINE ) 50 MG tablet Take 1 tablet (50 mg total) by mouth every 8 (eight) hours. Patient not taking: Reported on 06/08/2023 05/20/23   Maggie Schooner, MD  HYDROcodone -acetaminophen  (NORCO/VICODIN) 5-325 MG tablet Take 1-2 tablets by mouth every 6 (six) hours as needed for moderate pain (pain score 4-6) or severe  pain (pain score 7-10). Patient not taking: Reported on 06/08/2023 05/20/23   Maggie Schooner, MD  hydrocortisone  cream 1 % Apply 1 application  topically daily as needed (rash). Patient not taking: Reported on 06/08/2023    [provider]  isosorbide  dinitrate (ISORDIL ) 20 MG tablet Take 1 tablet (20 mg total) by mouth 3 (three)  times daily. 05/20/23   Amin, Ankit C, MD  Multiple Vitamin (MULTIVITAMIN WITH MINERALS) TABS tablet Take 1 tablet by mouth daily. 05/21/23   Amin, Ankit C, MD  ondansetron  (ZOFRAN ) 4 MG tablet Dissolve 1 tablet (4 mg total) by mouth daily as needed for nausea or vomiting. Patient not taking: Reported on 06/08/2023 05/06/23 05/05/24  True Fuss, MD  pantoprazole  (PROTONIX ) 40 MG tablet Take 1 tablet (40 mg total) by mouth 2 (two) times daily. 05/20/23   Amin, Ankit C, MD  thiamine  (VITAMIN B-1) 100 MG tablet Take 1 tablet (100 mg total) by mouth daily. 05/21/23   Maggie Schooner, MD     Family History  Problem Relation Age of Onset   Hypertension Mother    Hypertension Father    Colon cancer Father 103   Diabetes Father    Pancreatic cancer Neg Hx    Esophageal cancer Neg Hx    Liver cancer Neg Hx    Stomach cancer Neg Hx     Social History   Socioeconomic History   Marital status: Single    Spouse name: Not on file   Number of children: Not on file   Years of education: Not on file   Highest education level: Not on file  Occupational History   Not on file  Tobacco Use   Smoking status: Never   Smokeless tobacco: Never  Vaping Use   Vaping status: Never Used  Substance and Sexual Activity   Alcohol use: Not Currently   Drug use: Yes    Types: Marijuana    Comment: daily    Sexual activity: Yes    Birth control/protection: Condom  Other Topics Concern   Not on file  Social History Narrative   Not on file   Social Drivers of Health   Financial Resource Strain: Unknown (04/23/2017)   Received from [facility], [facility]   Overall Financial Resource Strain (CARDIA)    Difficulty of Paying Living Expenses: Patient declined  Food Insecurity: No Food Insecurity (05/21/2023)   Hunger Vital Sign    Worried About Running Out of Food in the Last Year: Never true    Ran Out of Food in the Last Year: Never true  Transportation Needs: No  Transportation Needs (05/21/2023)   PRAPARE - Administrator, Civil Service (Medical): No    Lack of Transportation (Non-Medical): No  Physical Activity: Unknown (04/23/2017)   Received from [facility], [facility]   Exercise Vital Sign    Days of Exercise per Week: Patient declined    Minutes of Exercise per Session: Patient declined  Stress: Unknown (04/23/2017)   Received from [facility], [facility]   [employer] of Occupational Health - Occupational Stress Questionnaire    Feeling of Stress : Patient declined  Social Connections: Unknown (04/23/2017)   Received from [facility], [facility]   Social Connection and Isolation Panel [NHANES]    Frequency of Communication with Friends and Family: Patient declined    Frequency of Social Gatherings with  Friends and Family: Patient declined    Attends Religious Services: Patient declined    Active Member of Clubs or Organizations: Patient declined    Attends Banker Meetings: Patient declined    Marital Status: Patient declined     Review of Systems: A 12 point ROS discussed and pertinent positives are indicated in the HPI above.  All other systems are negative.    Vital Signs: There were no vitals taken for this visit.  Advance Care Plan: The advanced care place/surrogate decision maker was discussed at the time of visit and the patient did not wish to discuss or was not able to name a surrogate decision maker or provide an advance care plan.  Physical Exam Vitals and nursing note reviewed.  Constitutional:      General: He is not in acute distress.    Appearance: He is not toxic-appearing.  HENT:     Mouth/Throat:     Mouth: Mucous membranes are moist.     Pharynx: Oropharynx is clear.  Cardiovascular:     Rate and Rhythm: Normal rate and regular rhythm.  Pulmonary:     Effort: Pulmonary  effort is normal.     Breath sounds: Normal breath sounds.  Abdominal:     Palpations: Abdomen is soft.     Tenderness: There is no abdominal tenderness.  Musculoskeletal:     Right lower leg: No edema.     Left lower leg: No edema.  Skin:    General: Skin is warm and dry.  Neurological:     Mental Status: He is alert and oriented to person, place, and time. Mental status is at baseline.     Imaging: CT ABDOMEN PELVIS W CONTRAST Result Date: 06/04/2023 EXAMINATION: CT ABDOMEN PELVIS W CONTRAST CLINICAL INDICATION: Male, 53 years old. F/U pancreatitis TECHNIQUE: Axial CT of the abdomen and pelvis with 100 cc Omnipaque  300 intravenous contrast. Multiplanar reformations provided. Unless otherwise specified, incidental thyroid , adrenal, renal lesions do not require dedicated imaging follow up. Additionally, any mentioned pulmonary nodules do not require dedicated imaging follow-up based on the Fleischner guidelines unless otherwise specified. Coronary calcifications are not identified unless otherwise specified. COMPARISON: 05/16/2023 FINDINGS: The lung bases are clear. The heart is normal in size. The liver contains a subcentimeter probable cyst. The gallbladder is normal. The spleen is normal. 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 There is interval growth of a pseudoaneurysm which appears to originate from a pancreaticoduodenal branch of the GDA now measuring 2.8 cm (previously measuring 10 mm). The adrenals are normal. Left kidney is normal. Right renal cysts are seen. The abdominal aorta is normal in caliber. Scattered atherosclerotic changes are present. Bladder is normal. The prostate is enlarged. Large and small bowel loops are otherwise within normal limits. There is no free fluid or pathologic lymphadenopathy by size criteria. Right hip arthroplasty is noted. There are degenerative changes of the spine and bony pelvis. 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 Prompt referral to a vascular specialist is recommended. DOSE REDUCTION: This exam was performed according to our departmental dose-optimization program which includes automated exposure control, adjustment of the mA and/or kV according to patient size and/or use of iterative reconstruction technique. Electronically signed by: Italy Engel MD 06/04/2023 11:48 AM EDT RP Workstation: [id number]  DG Abd 1 View Result Date: 05/20/2023 CLINICAL DATA:  Abdominal distension.  Constipation. EXAM: ABDOMEN - 1 VIEW COMPARISON:  05/18/2023 FINDINGS: No gaseous small bowel dilatation. Air in stool are seen scattered along the length of a mildly distended colon. Dystrophic calcification seen in the region of the head of the pancreas, better characterized by recent CT abdomen/pelvis. Status post right hip replacement. IMPRESSION: Mildly distended colon with air and stool. No gaseous small bowel dilatation. Electronically Signed   By: Donnal Fusi M.D.   On: 05/20/2023 09:20   DG Abd 2 Views Result Date: 05/18/2023 CLINICAL DATA:  Vomiting, abdominal pain EXAM: ABDOMEN - 2 VIEW COMPARISON:  06/20/2019 FINDINGS: The bowel gas pattern is normal. There is no evidence of free air. No radio-opaque calculi or other significant radiographic abnormality is seen. IMPRESSION: Negative. Electronically Signed   By: Janeece Mechanic M.D.   On: 05/18/2023 18:38   MR ABDOMEN MRCP W WO CONTAST Result Date: 05/16/2023 CLINICAL DATA:  Follow-up pancreatitis. EXAM: MRI ABDOMEN WITHOUT AND WITH CONTRAST (INCLUDING MRCP) TECHNIQUE: Multiplanar multisequence MR imaging of the abdomen was performed both before and after the administration of intravenous contrast. Heavily T2-weighted images of the biliary  and pancreatic ducts were obtained, and three-dimensional MRCP images were rendered by post processing. CONTRAST:  8mL GADAVIST  GADOBUTROL  1 MMOL/ML IV SOLN COMPARISON:  CT scan, same date.  Prior MRI abdomen 05/03/2023 FINDINGS: Lower chest: The lung bases are clear of an acute process. No pleural or pericardial effusion. Hepatobiliary: Stable appearing moderate intrahepatic biliary dilatation. There is also persistent significant common bile duct dilatation in the porta hepatis. The common bile duct is markedly compressed and becomes quite narrow in the upper head of the pancreas. This is likely due to significant acute on chronic pancreatitis. No hepatic lesions are identified. The portal and hepatic veins are patent. The gallbladder is distended and contains layering high T1 material which is likely inspissated bile. There is also some layering sludge or small gallstones. Mild pericholecystic fluid likely related to the patient's pancreatitis. No common bile duct stones are identified. Pancreas: Significant inflammation/edema involving the head of the pancreas along with peripancreatic and periduodenal fluid. Persistent multiple intrapancreatic pseudocysts and extensive pancreatic calcifications, better seen on the CT scan. Marked stable dilatation of the main pancreatic duct. Stable moderate atrophy of the body and tail region of the pancreas. Spleen:  Normal size.  No focal lesions. Adrenals/Urinary Tract: The adrenal glands and kidneys are unremarkable and stable. Stomach/Bowel: There is significant inflammation of the second and third portions of the duodenum surrounded by inflammatory phlegmon and fluid. Is moderate enhancement of the duodenal wall but no mechanical or functional obstruction. Stomach is unremarkable. The visualized small bowel colon are grossly normal. Vascular/Lymphatic: No pathologically enlarged lymph nodes identified. No abdominal aortic aneurysm demonstrated. The portal and splenic  veins are patent. The SMV is patent. Other: Small amount of fluid surrounding the pancreatic head and duodenum and also a small amount of fluid in the right anterior pararenal space but no overt ascites. Musculoskeletal: No significant bony findings. IMPRESSION: 1. Stable appearing moderate intrahepatic biliary dilatation and significant common bile duct dilatation in the porta hepatis. The common bile duct is markedly compressed and becomes quite narrow in the upper head of the pancreas. This is likely due to significant acute on chronic pancreatitis. Findings similar to prior MRI. 2. Distended gallbladder containing layering high T1 material which is likely inspissated bile. There is also some layering sludge or small gallstones. Mild  pericholecystic fluid likely related to the patient's pancreatitis. No common bile duct stones are identified. 3. Significant inflammation/edema involving the head of the pancreas along with peripancreatic and periduodenal fluid. Persistent multiple intrapancreatic pseudocysts and extensive pancreatic calcifications, better seen on the CT scan. Marked stable dilatation of the main pancreatic duct. Stable moderate atrophy of the body and tail region of the pancreas. 4. Significant inflammation of the second and third portions of the duodenum surrounded by inflammatory phlegmon and fluid. No mechanical or functional obstruction. Electronically Signed   By: Marrian Siva M.D.   On: 05/16/2023 22:06   CT Angio Chest PE W/Cm &/Or Wo Cm Result Date: 05/16/2023 CLINICAL DATA:  Pulmonary embolism (PE) suspected, high prob. Hemoptysis. EXAM: CT ANGIOGRAPHY CHEST WITH CONTRAST TECHNIQUE: Multidetector CT imaging of the chest was performed using the standard protocol during bolus administration of intravenous contrast. Multiplanar CT image reconstructions and MIPs were obtained to evaluate the vascular anatomy. RADIATION DOSE REDUCTION: This exam was performed according to the departmental  dose-optimization program which includes automated exposure control, adjustment of the mA and/or kV according to patient size and/or use of iterative reconstruction technique. CONTRAST:  75mL OMNIPAQUE  IOHEXOL  350 MG/ML SOLN COMPARISON:  10/15/2012 FINDINGS: Cardiovascular: No filling defects in the pulmonary arteries to suggest pulmonary emboli. Heart is normal size. Aorta is normal caliber. Mediastinum/Nodes: No mediastinal, hilar, or axillary adenopathy. Trachea and esophagus are unremarkable. Thyroid  unremarkable. Lungs/Pleura: No confluent opacities or effusions. Upper Abdomen: See report from abdominal CT today. Musculoskeletal: Chest wall soft tissues are unremarkable. No acute bony abnormality. Review of the MIP images confirms the above findings. IMPRESSION: No evidence of pulmonary embolus. No acute cardiopulmonary disease. Electronically Signed   By: Janeece Mechanic M.D.   On: 05/16/2023 21:25   CT ABDOMEN PELVIS W CONTRAST Result Date: 05/16/2023 CLINICAL DATA:  Abdominal pain and known pancreatitis EXAM: CT ABDOMEN AND PELVIS WITH CONTRAST TECHNIQUE: Multidetector CT imaging of the abdomen and pelvis was performed using the standard protocol following bolus administration of intravenous contrast. RADIATION DOSE REDUCTION: This exam was performed according to the departmental dose-optimization program which includes automated exposure control, adjustment of the mA and/or kV according to patient size and/or use of iterative reconstruction technique. CONTRAST:  OMNIPAQUE  IOHEXOL  300 MG/ML  SOLN COMPARISON:  MRI from 05/03/2023, CT from 05/03/2023 FINDINGS: Lower chest: No acute abnormality. Hepatobiliary: Liver is well visualized with mild intra hepatic ductal dilatation similar to that seen on the prior exam. Gallbladder is well distended without evidence of cholelithiasis. The common bile duct is prominent as is the pancreatic duct secondary to changes of prior pancreatitis in the region of the  pancreatic head. Pancreas: Pancreas demonstrates scattered calcifications consistent with chronic pancreatitis. Dilatation of the pancreatic duct is again seen. The head and uncinate process are enlarged but relatively stable from the prior exam. Slight increase in the degree of peripancreatic inflammatory changes noted consistent with an acute on chronic component. Spleen: Normal in size without focal abnormality. Adrenals/Urinary Tract: Adrenal glands are within normal limits. Kidneys demonstrate a normal enhancement pattern bilaterally. No renal calculi or obstructive changes are noted. The bladder is within normal limits. Stomach/Bowel: No obstructive or inflammatory changes of the colon are seen. The appendix is within normal limits. Small bowel and stomach are unremarkable with the exception of inflammatory changes in the second portion the duodenum related to the adjacent pancreatitis. These changes are similar in appearance. Vascular/Lymphatic: Aortic atherosclerosis. No enlarged abdominal or pelvic lymph nodes. Reproductive: Prostate is unremarkable.  Other: No abdominal wall hernia or abnormality. No abdominopelvic ascites. Musculoskeletal: Right hip replacement is noted. Mild degenerative changes of lumbar spine are seen. IMPRESSION: Changes consistent with acute on chronic pancreatitis with slight increase in the degree of peripancreatic inflammatory change when compared with the prior exam. This causes compression upon the central pancreatic duct and common bile duct with dilatation of both peripherally. Ductal changes are stable in appearance. No other focal abnormality is noted. Electronically Signed   By: Violeta Grey M.D.   On: 05/16/2023 20:25   DG Chest 2 View Result Date: 05/16/2023 CLINICAL DATA:  Hemoptysis EXAM: CHEST - 2 VIEW COMPARISON:  Chest CT report only 10/20/2012 FINDINGS: The heart size and mediastinal contours are within normal limits. Both lungs are clear. The visualized skeletal  structures are unremarkable. IMPRESSION: No active cardiopulmonary disease. Electronically Signed   By: Tyron Gallon M.D.   On: 05/16/2023 16:06    Labs:  CBC: Recent Labs    05/19/23 0501 05/20/23 0536 05/27/23 1056 06/01/23 0923  WBC 5.9 6.4 6.0 7.4  HGB 11.1* 11.5* 10.8* 10.6*  HCT 32.4* 34.7* 31.2* 31.0*  PLT 363 338 529.0* 495.0*    COAGS: Recent Labs    05/16/23 1927  INR 1.1    BMP: Recent Labs    05/17/23 0506 05/18/23 0458 05/19/23 0501 05/20/23 0536 05/27/23 1056 06/01/23 0923 06/04/23 0948  NA 135 132* 134* 133* 132* 136 139  K 4.0 3.8 3.7 3.4* 3.5 4.0 3.9  CL 101 100 100 102 100 106 104  CO2 24 23 23  18* 24 24 17*  GLUCOSE 134* 133* 114* 167* 154* 102* 151*  BUN 8 9 12 13 11 12 11   CALCIUM 9.0 9.1 9.4 9.3 9.1 9.2 9.6  CREATININE 0.65 0.63 0.83 0.61 0.82 0.73 0.73*  GFRNONAA >60 >60 >60 >60  --   --   --     LIVER FUNCTION TESTS: Recent Labs    05/20/23 0536 05/27/23 1056 06/01/23 0923 06/04/23 0948  BILITOT 14.1* 6.6* 4.4* 3.4*  AST 118* 65* 24 22  ALT 202* 128* 46 33  ALKPHOS 480* 454* 259* 296*  PROT 6.6 6.9 6.5 7.1  ALBUMIN 2.9* 3.8 3.7 4.2    TUMOR MARKERS: Recent Labs    03/04/23 1608  CA199 77*    Assessment and Plan:  Evan Sullivan is a 53 y.o. male with hx of DM2, HTN, HLD, GERD, prior recurrent alcohol induced acute pancreatitis, now with chronic pancreatitis. 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 Recent hospitalization for pancreatitis flare with discharge on 4/17. 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 Prompt referral to a vascular specialist is recommended.  Pt was initially referred to vascular  surgery who recommended pt be referred to IR for better definitive therapy with embolization.   Risks and benefits of pancreatic artery pseudoaneurysm embolization were discussed with the patient including, but not limited to, bleeding, infection, vascular injury, contrast induced renal failure, post procedural pain, nausea, vomiting, fatigue, progression of liver failure, chemical cholecystitis, or need for additional procedures.  This interventional procedure involves the use of X-rays and because of the nature of the planned procedure, it is possible that we will have prolonged use of X-Ahmod  fluoroscopy.  Potential radiation risks to you include (but are not limited to) the following: - A slightly elevated risk for cancer  several years later in life. This risk is typically less than 0.5% percent. This risk is low in comparison to the normal incidence of human cancer, which is 33% for women and 50% for men according to the American Cancer Society. - Radiation induced injury can include skin redness, resembling a rash, tissue breakdown / ulcers and hair loss (which can be temporary or permanent).   The likelihood of either of these occurring depends on the difficulty of the procedure and whether you are sensitive to radiation due to previous procedures, disease, or genetic conditions.   IF your procedure requires a prolonged use of radiation, you will be notified and given written instructions for further action.  It is your responsibility to monitor the irradiated area for the 2 weeks following the procedure and to notify your physician if you are concerned that you have suffered a radiation induced injury.    All of the patient's questions were answered, patient is agreeable to proceed.  Consent signed and in chart.    Thank you for allowing our service to participate in Evan Sullivan 's care.  Electronically Signed: Nicolasa Barrett, PA-C   06/08/2023, 10:10 AM      I spent a total  of 40 Minutes   in face to face in clinical consultation, greater than 50% of which was counseling/coordinating care for pancreatic artery pseudoaneurysm embolization.

## 2023-06-09 ENCOUNTER — Other Ambulatory Visit: Payer: Self-pay

## 2023-06-09 ENCOUNTER — Other Ambulatory Visit: Payer: Self-pay | Admitting: Interventional Radiology

## 2023-06-09 ENCOUNTER — Encounter: Payer: Self-pay | Admitting: Radiology

## 2023-06-09 ENCOUNTER — Ambulatory Visit
Admission: RE | Admit: 2023-06-09 | Discharge: 2023-06-09 | Disposition: A | Discharge status: Home / Self Care | Source: Ambulatory Visit | Attending: Interventional Radiology | Admitting: Interventional Radiology

## 2023-06-09 DIAGNOSIS — K859 Acute pancreatitis without necrosis or infection, unspecified: Secondary | ICD-10-CM | POA: Diagnosis not present

## 2023-06-09 DIAGNOSIS — I10 Essential (primary) hypertension: Secondary | ICD-10-CM | POA: Insufficient documentation

## 2023-06-09 DIAGNOSIS — I728 Aneurysm of other specified arteries: Secondary | ICD-10-CM

## 2023-06-09 DIAGNOSIS — E119 Type 2 diabetes mellitus without complications: Secondary | ICD-10-CM | POA: Diagnosis not present

## 2023-06-09 DIAGNOSIS — Z79899 Other long term (current) drug therapy: Secondary | ICD-10-CM | POA: Diagnosis not present

## 2023-06-09 DIAGNOSIS — I774 Celiac artery compression syndrome: Secondary | ICD-10-CM | POA: Diagnosis not present

## 2023-06-09 DIAGNOSIS — E785 Hyperlipidemia, unspecified: Secondary | ICD-10-CM | POA: Insufficient documentation

## 2023-06-09 DIAGNOSIS — K219 Gastro-esophageal reflux disease without esophagitis: Secondary | ICD-10-CM | POA: Diagnosis not present

## 2023-06-09 HISTORY — PX: IR EMBO ARTERIAL NOT HEMORR HEMANG INC GUIDE ROADMAPPING: IMG5448

## 2023-06-09 HISTORY — DX: Aneurysm of other specified arteries: I72.8

## 2023-06-09 LAB — CBC WITH DIFFERENTIAL/PLATELET
Abs Immature Granulocytes: 0.01 10*3/uL (ref 0.00–0.07)
Basophils Absolute: 0 10*3/uL (ref 0.0–0.1)
Basophils Relative: 1 %
Eosinophils Absolute: 0.2 10*3/uL (ref 0.0–0.5)
Eosinophils Relative: 6 %
HCT: 36.5 % — ABNORMAL LOW (ref 39.0–52.0)
Hemoglobin: 12.3 g/dL — ABNORMAL LOW (ref 13.0–17.0)
Immature Granulocytes: 0 %
Lymphocytes Relative: 38 %
Lymphs Abs: 1.6 10*3/uL (ref 0.7–4.0)
MCH: 32.1 pg (ref 26.0–34.0)
MCHC: 33.7 g/dL (ref 30.0–36.0)
MCV: 95.3 fL (ref 80.0–100.0)
Monocytes Absolute: 0.3 10*3/uL (ref 0.1–1.0)
Monocytes Relative: 7 %
Neutro Abs: 2 10*3/uL (ref 1.7–7.7)
Neutrophils Relative %: 48 %
Platelets: 398 10*3/uL (ref 150–400)
RBC: 3.83 MIL/uL — ABNORMAL LOW (ref 4.22–5.81)
RDW: 15.9 % — ABNORMAL HIGH (ref 11.5–15.5)
WBC: 4.2 10*3/uL (ref 4.0–10.5)
nRBC: 0 % (ref 0.0–0.2)

## 2023-06-09 LAB — COMPREHENSIVE METABOLIC PANEL WITH GFR
ALT: 25 U/L (ref 0–44)
AST: 24 U/L (ref 15–41)
Albumin: 3.8 g/dL (ref 3.5–5.0)
Alkaline Phosphatase: 166 U/L — ABNORMAL HIGH (ref 38–126)
Anion gap: 7 (ref 5–15)
BUN: 15 mg/dL (ref 6–20)
CO2: 24 mmol/L (ref 22–32)
Calcium: 9.5 mg/dL (ref 8.9–10.3)
Chloride: 105 mmol/L (ref 98–111)
Creatinine, Ser: 0.92 mg/dL (ref 0.61–1.24)
GFR, Estimated: >60 mL/min (ref >60)
Glucose, Bld: 85 mg/dL (ref 70–99)
Potassium: 3.8 mmol/L (ref 3.5–5.1)
Sodium: 136 mmol/L (ref 135–145)
Total Bilirubin: 3.4 mg/dL — ABNORMAL HIGH (ref 0.0–1.2)
Total Protein: 7.8 g/dL (ref 6.5–8.1)

## 2023-06-09 LAB — PROTIME-INR
INR: 1 (ref 0.8–1.2)
Prothrombin Time: 13.1 s (ref 11.4–15.2)

## 2023-06-09 MED ORDER — HYDROMORPHONE HCL 1 MG/ML IJ SOLN
1.0000 mg | INTRAMUSCULAR | Status: DC | PRN
  Administered 2023-06-09: 1 mg via INTRAVENOUS

## 2023-06-09 MED ORDER — FENTANYL CITRATE (PF) 100 MCG/2ML IJ SOLN
INTRAMUSCULAR | Status: AC
  Filled 2023-06-09: qty 2

## 2023-06-09 MED ORDER — IOHEXOL 300 MG/ML  SOLN
105.0000 mL | Freq: Once | INTRAMUSCULAR | Status: AC | PRN
  Administered 2023-06-09: 100 mL via INTRA_ARTERIAL

## 2023-06-09 MED ORDER — MIDAZOLAM HCL 5 MG/5ML IJ SOLN
INTRAMUSCULAR | Status: DC | PRN
  Administered 2023-06-09: 1 mg via INTRAVENOUS

## 2023-06-09 MED ORDER — SODIUM CHLORIDE 0.9 % IV SOLN
INTRAVENOUS | Status: DC | PRN
  Administered 2023-06-09: 2 g via INTRAVENOUS

## 2023-06-09 MED ORDER — SODIUM CHLORIDE 0.9 % IV SOLN: INTRAVENOUS | Status: DC

## 2023-06-09 MED ORDER — LIDOCAINE HCL 1 % IJ SOLN
INTRAMUSCULAR | Status: AC
  Filled 2023-06-09: qty 20

## 2023-06-09 MED ORDER — MIDAZOLAM HCL 2 MG/2ML IJ SOLN
INTRAMUSCULAR | Status: AC
  Filled 2023-06-09: qty 2

## 2023-06-09 MED ORDER — SODIUM CHLORIDE 0.9 % IV SOLN
2.0000 g | INTRAVENOUS | Status: DC
  Filled 2023-06-09: qty 2

## 2023-06-09 MED ORDER — HYDROMORPHONE HCL 1 MG/ML IJ SOLN
INTRAMUSCULAR | Status: AC
  Filled 2023-06-09: qty 1

## 2023-06-09 MED ORDER — MIDAZOLAM HCL 2 MG/2ML IJ SOLN
INTRAMUSCULAR | Status: DC | PRN
  Administered 2023-06-09: 1 mg via INTRAVENOUS
  Administered 2023-06-09 (×2): .5 mg via INTRAVENOUS

## 2023-06-09 MED ORDER — FENTANYL CITRATE (PF) 100 MCG/2ML IJ SOLN
INTRAMUSCULAR | Status: DC | PRN
  Administered 2023-06-09: 25 ug via INTRAVENOUS
  Administered 2023-06-09 (×2): 50 ug via INTRAVENOUS
  Administered 2023-06-09: 25 ug via INTRAVENOUS

## 2023-06-09 MED ORDER — LIDOCAINE HCL 1 % IJ SOLN
3.0000 mL | Freq: Once | INTRAMUSCULAR | Status: AC
  Administered 2023-06-09: 3 mL via INTRADERMAL

## 2023-06-09 NOTE — Procedures (Signed)
 Interventional Radiology Procedure Note  Procedure: Successful coil embolization of pseudoaneurysm arising from the posterior inferior pancreaticoduodenal artery.   Complications: None  Estimated Blood Loss: None  Recommendations: - Bedrest x 1 hr - DC home   Signed,  Roxie Cord, MD

## 2023-06-09 NOTE — Progress Notes (Signed)
 Patient clinically stable post IR Pseudoaneurysm angiogram/embolization per Dr Marne Sings, tolerated well. Awake most of procedure despite meds given , however vitals stable and appearing comfortable during procedure. Received Versed  3 mg along with Fentanyl  150 mcg IV for procedure. No bleeding nor hematoma at right groin site. Report given to Kelton Patron RN post procedure/specials./14

## 2023-06-09 NOTE — Discharge Instructions (Signed)

## 2023-06-10 ENCOUNTER — Encounter: Payer: Self-pay | Admitting: Family Medicine

## 2023-06-16 ENCOUNTER — Telehealth: Payer: Self-pay | Admitting: Family Medicine

## 2023-06-16 NOTE — Telephone Encounter (Unsigned)
 Copied from CRM [id number]. Topic: Clinical - Medication Refill >> Jun 16, 2023  1:37 PM Elle L wrote: Medication: isosorbide  dinitrate (ISORDIL ) 20 MG tablet AND thiamine  (VITAMIN B-1) 100 MG tablet  The patient is requesting a refill if Dr. Arabella Beach still wants him to take this prescription as they were prescribed by the hospital. He is unsure.    Has the patient contacted their pharmacy? Yes  This is the patient's preferred pharmacy:   [facility] [address], [place] - [address] [place] [address] Phone: [phone] Fax: [phone]  Is this the correct pharmacy for this prescription? Yes  Has the prescription been filled recently? Yes  Is the patient out of the medication? Yes  Has the patient been seen for an appointment in the last year OR does the patient have an upcoming appointment? Yes  Can we respond through MyChart? Yes  Agent: Please be advised that Rx refills may take up to 3 business days. We ask that you follow-up with your pharmacy.

## 2023-06-17 MED ORDER — VITAMIN B-1 100 MG PO TABS: 100.0000 mg | ORAL_TABLET | Freq: Every day | ORAL | 5 refills | Status: DC

## 2023-06-17 MED ORDER — ISOSORBIDE DINITRATE 20 MG PO TABS: 20.0000 mg | ORAL_TABLET | Freq: Three times a day (TID) | ORAL | 5 refills | Status: AC

## 2023-06-17 NOTE — Telephone Encounter (Signed)
 Yes, I am sending in refills for them.

## 2023-06-18 ENCOUNTER — Telehealth: Payer: Self-pay

## 2023-06-18 MED ORDER — FOLIC ACID 1 MG PO TABS: 1.0000 mg | ORAL_TABLET | Freq: Every day | ORAL | 5 refills | Status: DC

## 2023-06-18 NOTE — Telephone Encounter (Signed)
 Yes - I have sent it in

## 2023-06-18 NOTE — Addendum Note (Signed)
 Addended by: Laneta Pintos on: 06/18/2023 12:07 PM   Modules accepted: Orders

## 2023-06-18 NOTE — Telephone Encounter (Signed)
 Pt informed of below.

## 2023-06-18 NOTE — Telephone Encounter (Signed)
 Copied from CRM [id number]. Topic: Clinical - Medication Question >> Jun 18, 2023 10:03 AM Star East wrote: Reason for CRM: Patient asking if he needs to keep taking Folic Acid , please call to advise as he will need a refill if Dr Arabella Beach wants him to resume  [phone]

## 2023-06-19 ENCOUNTER — Other Ambulatory Visit: Payer: Self-pay | Admitting: Family Medicine

## 2023-06-19 DIAGNOSIS — E781 Pure hyperglyceridemia: Secondary | ICD-10-CM

## 2023-06-20 NOTE — Progress Notes (Signed)
 06/20/2023 Evan Sullivan [id number] 31-Dec-1970   Chief Complaint: Pancreatitis follow up  History of Present Illness: Evan Sullivan is a 53 y.o. male Evan Sullivan is a 54 y.o. male  Evan Sullivan is a 53 y.o. male with a past medical history of arthritis, hypertension, diabetes mellitus type 2, gout, GERD, H. pylori 07/2019 and chronic pancreatitis secondary to alcohol use disorder (abstinent x 1 year).  He was previously seen by Dr. Cherryl Corona 03/26/2023 for follow-up regarding chronic pancreatitis and he was subsequently referred to [facility] for EUS and possible ERCP.  He underwent an EUS 04/12/2023 by Dr. Otilio Block at [facility] which showed sonographic changes consistent with severe chronic pancreatitis, 1 abnormal lymph node was visualized in the peripancreatic reason and FNA  was negative for malignancy and there was no evidence of significant pathology in the left lobe of the liver.   He was admitted to the hospital 05/03/2023 with worsening LUQ pain.  Admission labs showed a WBC count of 5.1.  Hemoglobin 15.1.  Hematocrit 44.7.  Platelets 337. Total bili 5.6.  Alk phos 265.  AST 157.  ALT 243.  Lipase 435.  Troponin 16 - 20. HIV negative. CTAP with contrast showed acute on chronic pancreatitis with similar small pseudocyst in the head and uncinate process of the pancreas. Abdominal MRI/MRCP 05/03/2023 showed severe stigmata of acute and chronic pancreatitis including multiple pseudocyst within the pancreatic head, diffuse atrophy of the distal pancreas with severe PD dilatation, severe intra/extrahepatic biliary ductal dilatation which has increased when compared to prior imaging and the CBD nearly effaced within the superior pancreatic head without evidence of choledocholithiasis. His T. Bili level continued to uptrend throughout his hospitalization thought to be secondary to surrounding pancreatic edema resulting in external compression/obstruction of the CBD.  His abdominal pain continued to improve and his diet  was slowly advanced. T. Bili 8.5 -> 8.8, direct bili 5.9, Alk phos 276 -> 344, AST 159 -> 440 and ALT 183 -> 346 and Lipase 93 at time of discharge 05/06/2023.  He was scheduled for a video follow-up visit with Dr. Camille Cedars with [facility] gastroenterology 06/30/2023.   Follow-up labs done by his PCP 05/12/2023 showed a total bilirubin level of 12.7.  Direct bili 7.6.  Alk phos 380.  AST 51.  ALT 134.  He was readmitted to the hospital 05/16/2023 due to worsening LUQ pain and he endorsed coughing up phlegm with a small amount of dark red blood.  Labs in the ED showed a WBC count of 5.1. Hg 12. 7 down from 14 (eleven days ago). HCT 36.3. PLT 415.  NA+ 133. K+ 3.5. Glu 126. BUN 6. Cr 0.41. Albumin 3.1. T. Bili 13. Alk phos 483. AST 235. ALT 277. Lipase 207.  INR 1.1. Chest CT negative for PE. Abdominal MRI/MRCP with and without contrast showed stable, moderate intrahepatic biliary dilatation, significant CBD dilatation in the porta hepatis, CBD is markedly compressed and narrow in the upper head of the pancreas likely due to significant acute on chronic pancreatitis (similar when compared to prior MRI), distended gallbladder with layering sludge or stones with pericholecystic fluid, no evidence of choledocholithiasis, significant inflammation/edema involving the head of the with persistent multiple intrapancreatic pseudocysts and extensive pancreatic calcifications, stable PD dilatation, atrophy of the body and tail and significant inflammation to the 2nd and 3rd portions of the duodenum without obstruction.  He received pain medications, antiemetic and placed on a clear liquid diet.  His symptoms stabilized and his  diet was advanced.  He was discharged home 05/20/2023.  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   He was referred to interventional radiology, evaluated by Dr. 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  He presents to our office today for further follow-up and is scheduled for a video visit with Dr. Camille Cedars with [facility] gastroenterology 06/30/2023.  He is accompanied by his significant other.  He denies having any nausea or vomiting.  No significant abdominal pain.  He sometimes has a little discomfort throughout his upper abdomen.  He remains on Creon  3 times daily with meals.  He is not taking Pantoprazole  because it causes worse abdominal pain.  Off narcotic pain meds for the past 2 to 3 weeks.  He stated his urine is normal yellow.  He is passing normal brown formed stool most days.  He takes MiraLAX  as needed.  Remains abstinent from alcohol for the past year.  He is requesting GI clearance to return to work, he wishes to resume work as soon as possible.    PRIOR IMAGE STUDIES:   Abdominal MRI/MRCP W/WO contrast 05/03/2023: 1. Severe stigmata of chronic pancreatitis, including multiple  pseudocysts within the pancreatic head, not significantly changed  compared to examination dated 02/18/2023, and diffuse atrophy of the  distal pancreas. Severe pancreatic ductal dilatation, duct measuring  up to 1.1 cm and, the main pancreatic duct effaced within the  pancreatic head.  2. Diffuse inflammatory fat stranding and fluid about the pancreas,  predominantly of the pancreatic head, and to a lesser extent the  adjacent retroperitoneum and small bowel mesentery. Findings are  consistent with acute on chronic pancreatitis.  3. Severe intra and extrahepatic biliary ductal dilatation, which is  somewhat increased compared to examination dated 02/18/2023. Common  bile duct is again nearly effaced  within the superior pancreatic  head. No choledocholithiasis.  4. Similar, mild gallbladder wall thickening.  Aortic Atherosclerosis    CTAP with contrast 05/03/2023: FINDINGS: Lower chest: The visualized lung bases are clear.   No intra-abdominal free air.  Small free fluid in the pelvis.   Hepatobiliary: The liver is unremarkable. Mild biliary dilatation. The gallbladder is distended. No calcified gallstone.   Pancreas: There is inflammatory changes of the pancreas. Enlargement of the proximal pancreas predominantly involving the head and uncinate process with dilatation of the main pancreatic duct. Coarse calcifications of the proximal pancreas sequela of chronic pancreatitis. Similar appearance of small cystic structures in the head and uncinate process of the pancreas, likely pseudocysts.   Spleen: Normal in size without focal abnormality.   Adrenals/Urinary Tract: The adrenal glands are unremarkable. The kidneys, visualized ureters, and urinary bladder appear unremarkable.   Stomach/Bowel: There is no bowel obstruction or active inflammation. The appendix is normal.   Vascular/Lymphatic: Mild aortoiliac atherosclerotic disease. The IVC is unremarkable. No portal venous gas. There is compression of the main portal vein by the inflammatory changes of the pancreas. No adenopathy.   Reproductive: The prostate and seminal vesicles are grossly remarkable.   Other: Intramuscular lipoma of the left groin.   Musculoskeletal: Total right hip arthroplasty. No acute osseous pathology.   IMPRESSION: 1. Acute on chronic pancreatitis. Similar small  pseudocyst in the uncinate process and head of the pancreas. 2. No bowel obstruction. Normal appendix.  GI PROCEDURES:    EUS 04/12/2023 at [facility]:  - There was no evidence of significant pathology in the left lobe of the liver.  - Endosonographic imaging of the pancreas showed sonographic changes consistent with severe chronic    pancreatitis.  - One abnormal lymph node was visualized in the  peripancreatic region. Fine needle biopsy performed.  - There was no sign of significant pathology in the common bile duct.  - Normal major papilla.  Path report:  - Peripancreatic lymph node, fine-needle biopsy  - Blood and proteinaceous material with scattered macrophages, suggestive of cyst contents  - Superficial sampling of benign small bowel mucosa with foveolar metaplasia  - No dysplasia or malignancy is identified in this sampling    EGD 07/16/2021:  - Normal esophagus.  - Gastroesophageal flap valve classified as Hill Grade IV (no fold, wide open lumen, hiatal  hernia present).  - Gastritis with hemorrhage.  Localized severe inflammation with hemorrhage characterized by adherent blood, erythema  and shallow ulcerations was found in the gastric antrum. Biopsies were taken with a cold  forceps for Helicobacter pylori testing. Estimated blood loss was minimal.Biopsied. This is the likely source of the patient's iron  deficiency  anemia.  - Duodenitis. - Localized severe inflammation characterized by congestion (edema), erythema and shallow  ulcerations was found in the duodenal bulb and in the second portion of the duodenum.  Biopsies were taken with a cold forceps for histology. Estimated blood loss was minimal.    Colonoscopy 07/16/2021:  - One 6 mm polyp in the ascending colon, removed with a cold snare. Resected and retrieved.  - Two 3 to 5 mm polyps in the descending colon, removed  retrieved.  - Diverticulosis in the sigmoid colon, in the transverse colon and in the ascending colon.  - Non-bleeding internal hemorrhoids.  - 7 year recall colonoscopy    1. Surgical [P], duodenal biopsies  ACUTE DUODENITIS  2. Surgical [P], gastric biopsies  CHRONIC ACTIVE GASTRITIS WITH SURFACE EROSION  HELICOBACTER PRESENT  NEGATIVE FOR INTESTINAL METAPLASIA, DYSPLASIA AND CARCINOMA  MINUTE DETACHED FRAGMENT OF REACTIVE  SQUAMOUS MUCOSA WITH CHANGES CONSISTENT WITH REFLUX  ESOPHAGITIS (1 EOS/HIGH POWER FIELD)  3. Surgical [P], colon, ascending and descending, polyp (3)  TUBULAR ADENOMA  HYPERPLASTIC POLYP  NEGATIVE FOR HIGH-GRADE DYSPLASIA AND CARCINOMA    Colonoscopy 11/30/2019:  - One 15 mm polyp in the ascending colon, removed with a hot snare. Resected and retrieved. Clip (MR  conditional) was placed.  - Two 3 to 5 mm polyps in the rectum, removed with a cold snare. Resected and retrieved.  - The examination was otherwise normal.  - The distal rectum and anal verge are normal on retroflexion view.  A. COLON POLYP, ASCENDING; HOT SNARE:  - COLONIC MUCOSA WITH SUBMUCOSAL LIPOMA.  - NEGATIVE FOR DYSPLASIA AND MALIGNANCY.  B.  RECTUM POLYP; COLD SNARE:  - HYPERPLASTIC POLYP.  - NEGATIVE FOR DYSPLASIA AND MALIGNANCY.      Current Outpatient Medications on File Prior to Visit  Medication Sig Dispense Refill   amLODipine  (NORVASC ) 10 MG tablet TAKE 1 TABLET BY MOUTH EVERY DAY 90 tablet 1   bisacodyl  (DULCOLAX) 5 MG EC tablet Take 2 tablets (10 mg total) by mouth 2 (two) times daily as needed for moderate constipation or severe constipation. 60 tablet 0   colchicine  0.6 MG tablet Take 1 capsule by mouth daily as needed (  gout pain) 30 tablet 1   CREON  36000-114000 units CPEP capsule TAKE 2 CAPSULES (72,000 UNITS TOTAL) BY MOUTH WITH BREAKFAST, WITH LUNCH, AND WITH EVENING MEAL. 200 capsule 1   folic acid  (FOLVITE ) 1 MG tablet Take 1 tablet (1 mg total) by mouth daily. 30 tablet 5   HYDROcodone -acetaminophen  (NORCO/VICODIN) 5-325 MG tablet Take 1-2 tablets by mouth every 6 (six) hours as needed for moderate pain (pain score 4-6) or severe pain (pain score 7-10). 30 tablet 0   hydrocortisone  cream 1 % Apply 1 application  topically daily as needed (rash).     isosorbide  dinitrate (ISORDIL ) 20 MG tablet Take 1 tablet (20 mg total) by mouth 3 (three) times daily. 90 tablet 5   Multiple Vitamin (MULTIVITAMIN  WITH MINERALS) TABS tablet Take 1 tablet by mouth daily.     ondansetron  (ZOFRAN ) 4 MG tablet Dissolve 1 tablet (4 mg total) by mouth daily as needed for nausea or vomiting. 30 tablet 0   pantoprazole  (PROTONIX ) 40 MG tablet Take 1 tablet (40 mg total) by mouth 2 (two) times daily. (Patient taking differently: Take 40 mg by mouth 2 (two) times daily as needed.) 60 tablet 0   thiamine  (VITAMIN B-1) 100 MG tablet Take 1 tablet (100 mg total) by mouth daily. 30 tablet 5   diphenhydramine -acetaminophen  (TYLENOL  PM) 25-500 MG TABS tablet Take 2 tablets by mouth at bedtime as needed (Sleep/Pain). (Patient not taking: Reported on 06/08/2023)     No current facility-administered medications on file prior to visit.   No Known Allergies  Current Medications, Allergies, Past Medical History, Past Surgical History, Family History and Social History were reviewed in [employer] record.  Review of Systems:   Constitutional: + Weight loss. Respiratory: Negative for shortness of breath.   Cardiovascular: Negative for chest pain, palpitations and leg swelling.  Gastrointestinal: See HPI.  Musculoskeletal: Negative for back pain or muscle aches.  Neurological: Negative for dizziness, headaches or paresthesias.   Physical Exam: BP 128/84   Pulse (!) 50   Ht 6\' 1"  (1.854 m)   Wt 163 lb (73.9 kg)   SpO2 99%   BMI 21.51 kg/m   Wt Readings from Last 3 Encounters:  06/21/23 163 lb (73.9 kg)  06/09/23 163 lb 4.8 oz (74.1 kg)  06/04/23 170 lb 2.2 oz (77.2 kg)    General: 53 year old male in no acute distress. Head: Normocephalic and atraumatic. Eyes: No scleral icterus. Conjunctiva pink . Ears: Normal auditory acuity. Mouth: Dentition intact. No ulcers or lesions.  Lungs: Clear throughout to auscultation. Heart: Regular rate and rhythm, no murmur. Abdomen: Soft, nontender and nondistended. No masses or hepatomegaly. Normal bowel sounds x 4 quadrants.  Rectal: Deferred.   Musculoskeletal: Symmetrical with no gross deformities. Extremities: No edema. Neurological: Alert oriented x 4. No focal deficits.  Psychological: Alert and cooperative. Normal mood and affect  Assessment and Recommendations:  53 year old male with acute on chronic pancreatitis. Prior EUS 04/12/2023 by Dr. Otilio Block at [facility] showed severe chronic pancreatitis, 1 abnormal lymph node was visualized in the peripancreatic reason and FNA was negative for malignancy. Recently admitted 3/31 - 05/06/2023 with worsening pancreatitis and associated elevated T. Bil and LFTs treated with supportive care. Readmitted 05/16/2023 with recurrent LUQ pain and hemoptysis x 1 episode. T. Bili 13 -> 11.4. Alk phos 483 -> 445. AST 235 -> 179. ALT 277 -> 228. Lipase 207.  WBC 4.7. Abdominal MRI/MRCP showed stable, moderate intrahepatic biliary dilatation, significant CBD dilatation in the porta hepatis,  CBD markedly compressed and narrow in the upper head of the pancreas, distended gallbladder with layering sludge or stones with pericholecystic fluid, no evidence of choledocholithiasis, significant inflammation/edema involving the head of the with persistent multiple intrapancreatic pseudocysts and extensive pancreatic calcifications, stable PD dilatation, atrophy of the body and tail and significant inflammation to the 2nd and 3rd portions of the duodenum without obstruction.  His clinical status stabilized and he was discharged home 05/20/2023.  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    He was referred to interventional radiology, evaluated by Dr. Fernando Hoyer 06/08/2023 and underwent  successful coil embolization of the pseudoaneurysm arising from  the posterior inferior pancreaticoduodenal artery on 06/09/2023.  -CMP, direct bili and lipase level -To review case with Dr. Cherryl Corona prior to providing recommendations regarding return to work date -Patient to follow up with Dr. Camille Cedars at [facility] as scheduled via video visit Wed 06/30/2023 at 9:20AM. Defer further pancreatitis management per Dr. Camille Cedars.  -Heart healthy diet as tolerated -Continue Creon  36K 2 capsules p.o. 3 times daily -Continue follow-up with interventional radiology 07/01/2023 as scheduled

## 2023-06-21 ENCOUNTER — Other Ambulatory Visit

## 2023-06-21 ENCOUNTER — Telehealth: Payer: Self-pay | Admitting: Nurse Practitioner

## 2023-06-21 ENCOUNTER — Ambulatory Visit: Admitting: Nurse Practitioner

## 2023-06-21 ENCOUNTER — Other Ambulatory Visit: Payer: Self-pay

## 2023-06-21 ENCOUNTER — Ambulatory Visit: Payer: Self-pay | Admitting: Nurse Practitioner

## 2023-06-21 ENCOUNTER — Encounter: Payer: Self-pay | Admitting: Nurse Practitioner

## 2023-06-21 VITALS — BP 128/84 | HR 50 | Ht 73.0 in | Wt 163.0 lb

## 2023-06-21 DIAGNOSIS — K859 Acute pancreatitis without necrosis or infection, unspecified: Secondary | ICD-10-CM

## 2023-06-21 DIAGNOSIS — E781 Pure hyperglyceridemia: Secondary | ICD-10-CM

## 2023-06-21 LAB — COMPREHENSIVE METABOLIC PANEL WITH GFR
ALT: 12 U/L (ref 0–53)
AST: 16 U/L (ref 0–37)
Albumin: 4.4 g/dL (ref 3.5–5.2)
Alkaline Phosphatase: 109 U/L (ref 39–117)
BUN: 13 mg/dL (ref 6–23)
CO2: 25 meq/L (ref 19–32)
Calcium: 10 mg/dL (ref 8.4–10.5)
Chloride: 104 meq/L (ref 96–112)
Creatinine, Ser: 0.74 mg/dL (ref 0.40–1.50)
GFR: 104.53 mL/min (ref >60.00)
Glucose, Bld: 125 mg/dL — ABNORMAL HIGH (ref 70–99)
Potassium: 4 meq/L (ref 3.5–5.1)
Sodium: 138 meq/L (ref 135–145)
Total Bilirubin: 1.8 mg/dL — ABNORMAL HIGH (ref 0.2–1.2)
Total Protein: 7.9 g/dL (ref 6.0–8.3)

## 2023-06-21 LAB — LIPASE: Lipase: 119 U/L — ABNORMAL HIGH (ref 11.0–59.0)

## 2023-06-21 LAB — BILIRUBIN, DIRECT: Bilirubin, Direct: 0.9 mg/dL — ABNORMAL HIGH (ref 0.0–0.3)

## 2023-06-21 MED ORDER — FENOFIBRATE 48 MG PO TABS: 48.0000 mg | ORAL_TABLET | Freq: Every day | ORAL | 0 refills | Status: DC

## 2023-06-21 NOTE — Telephone Encounter (Signed)
**Note De-identified  [place] Obfuscation** Please advise 

## 2023-06-21 NOTE — Patient Instructions (Addendum)
 You are scheduled for an appointment with pancreas specialist Dr. Camille Cedars at [facility] via video visit Wed 06/30/2023 at 9:20AM. Please call [phone]  if you have questions regarding this appointment.   Continue Creon  with each meal as previously prescribed   Your provider has requested that you go to the basement level for lab work before leaving today. Press "B" on the elevator. The lab is located at the first door on the left as you exit the elevator.  _______________________________________________________  If your blood pressure at your visit was 140/90 or greater, please contact your primary care physician to follow up on this.  _______________________________________________________  If you are age 74 or older, your body mass index should be between 23-30. Your Body mass index is 21.51 kg/m. If this is out of the aforementioned range listed, please consider follow up with your Primary Care Provider.  If you are age 75 or younger, your body mass index should be between 19-25. Your Body mass index is 21.51 kg/m. If this is out of the aformentioned range listed, please consider follow up with your Primary Care Provider.   ________________________________________________________  The [facility] providers would like to encourage you to use MYCHART to communicate with providers for non-urgent requests or questions.  Due to long hold times on the telephone, sending your provider a message by [facility] may be a faster and more efficient way to get a response.  Please allow 48 business hours for a response.  Please remember that this is for non-urgent requests.  _______________________________________________________

## 2023-06-21 NOTE — Telephone Encounter (Signed)
 Noted.   I spoke to pt and informed him that we received the forms, Everett Hitt, NP is still in clinic this afternoon, she will complete the form at her earliest convenience, and have them completed by 4 PM tomorrow. Pt verbalized understanding.

## 2023-06-21 NOTE — Telephone Encounter (Signed)
 Inbound call from patient requesting a call to discuss if he is able to return to work as normal. Please advise, thank you.

## 2023-06-21 NOTE — Telephone Encounter (Signed)
 Medication has been sent to pharmacy.

## 2023-06-21 NOTE — Telephone Encounter (Signed)
 Evan Sullivan, patient was seen earlier this morning in office, he faxed a return to work form for me to complete.  I am still in clinic this afternoon and will not be able to complete this form at this time.  I will contact patient tomorrow, will have form completed by 4pm tomorrow. THX.

## 2023-06-22 ENCOUNTER — Telehealth: Payer: Self-pay | Admitting: Nurse Practitioner

## 2023-06-22 ENCOUNTER — Encounter: Payer: Self-pay | Admitting: Nurse Practitioner

## 2023-06-22 NOTE — Telephone Encounter (Signed)
 I completed the patient's return to work form, signed form, ok to return to work effective 5/202/205 with restriction including not to lift any object heavier than 20 lbs for the next 4 weeks. Patient to pick up form a front desk today. 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 I also advised the patient any further recommendations deferred to Dr. Demetra Filter Gastroenterologist, patient has vide appointment scheduled Wed 06/30/2023 at 9:20AM.   Dr. Nevin Barcelona, refer to office visit 06/21/2023.

## 2023-06-22 NOTE — Telephone Encounter (Signed)
 Patient called back stating that his work is going to give him a "point" because paperwork states that he can return to work on 06/22/23. He says that our office told him to return to work on 06/23/23. He is requesting letter be written stating that he can return to work on 06/23/23.

## 2023-06-22 NOTE — Telephone Encounter (Signed)
 Letter sent via portal and pt informed. He has no further questions or concerns at this time.

## 2023-06-22 NOTE — Telephone Encounter (Signed)
 Evan Sullivan, yesterday during patient's office visit he stated he wanted to return to work today therefore I entered today's date for return to work. It is ok to  send a letter to his employer with a recommended start date effective 06/23/2023. THX.

## 2023-06-30 DIAGNOSIS — K8689 Other specified diseases of pancreas: Secondary | ICD-10-CM | POA: Diagnosis not present

## 2023-06-30 DIAGNOSIS — K860 Alcohol-induced chronic pancreatitis: Secondary | ICD-10-CM | POA: Diagnosis not present

## 2023-07-01 ENCOUNTER — Ambulatory Visit
Admission: RE | Admit: 2023-07-01 | Discharge: 2023-07-01 | Disposition: A | Discharge status: Home / Self Care | Source: Ambulatory Visit | Attending: Urology

## 2023-07-01 DIAGNOSIS — I728 Aneurysm of other specified arteries: Secondary | ICD-10-CM

## 2023-07-01 DIAGNOSIS — K861 Other chronic pancreatitis: Secondary | ICD-10-CM | POA: Diagnosis not present

## 2023-07-01 HISTORY — PX: IR RADIOLOGIST EVAL & MGMT: IMG5224

## 2023-07-01 NOTE — Progress Notes (Signed)
 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 male with a history of chronic pancreatitis.  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    He underwent angiogram and embolization on 06/09/23.  He presents to the clinic today for follow-up.  He has been doing well.  He feels overall his abdominal pain is improved after the procedure.  He was doing really well until yesterday when he felt constipated, but this seems to be resolving.  Right groin site is well healed.   Past Medical History:  Diagnosis Date   Anemia    Arthritis    Avascular necrosis of femoral head, right (HCC) 2019   Diet-controlled type 2 diabetes mellitus (HCC) 09/07/2021   GERD (gastroesophageal reflux disease)    Gout    Hypertension    Pancreatic artery aneurysm (HCC)    Peptic ulcer disease     Past Surgical History:  Procedure Laterality Date   COLONOSCOPY     COLONOSCOPY WITH PROPOFOL  N/A 11/30/2019   Procedure: COLONOSCOPY WITH PROPOFOL ;  Surgeon: Selena Daily, MD;  Location: [facility];  Service: Endoscopy;  Laterality: N/A;  priority 4   IR EMBO ARTERIAL NOT HEMORR HEMANG INC GUIDE ROADMAPPING  06/09/2023   IR RADIOLOGIST EVAL & MGMT  06/08/2023   IR RADIOLOGIST EVAL & MGMT  07/01/2023   KNEE ARTHROSCOPY, MEDIAL PATELLO FEMORAL LIGAMENT REPAIR Bilateral 2004,2000   POLYPECTOMY  11/30/2019   Procedure: POLYPECTOMY;  Surgeon: Selena Daily, MD;  Location: [facility];  Service: Endoscopy;;   TOTAL HIP ARTHROPLASTY Right 08/02/2017   Procedure: TOTAL HIP ARTHROPLASTY;  Surgeon: Arlyne Lame, MD;  Location: [facility];  Service: Orthopedics;   Laterality: Right;    Allergies: Patient has no known allergies.  Medications: Prior to Admission medications   Medication Sig Start Date End Date Taking? Authorizing Provider  amLODipine  (NORVASC ) 10 MG tablet TAKE 1 TABLET BY MOUTH EVERY DAY 04/19/23   Laneta Pintos, MD  bisacodyl  (DULCOLAX) 5 MG EC tablet Take 2 tablets (10 mg total) by mouth 2 (two) times daily as needed for moderate constipation or severe constipation. 05/20/23   Maggie Schooner, MD  colchicine  0.6 MG tablet Take 1 capsule by mouth daily as needed (gout pain) 06/26/20   McDonough, Lauren K, PA-C  CREON  36000-114000 units CPEP capsule TAKE 2 CAPSULES (72,000 UNITS TOTAL) BY MOUTH WITH BREAKFAST, WITH LUNCH, AND WITH EVENING MEAL. 04/14/23   Elois Hair, MD  diphenhydramine -acetaminophen  (TYLENOL  PM) 25-500 MG TABS tablet Take 2 tablets by mouth at bedtime as needed (Sleep/Pain). Patient not taking: Reported on 06/08/2023    [provider]  fenofibrate  (TRICOR ) 48 MG tablet Take 1 tablet (48 mg total) by mouth daily. 06/21/23   Laneta Pintos, MD  folic acid  (FOLVITE ) 1 MG tablet Take 1 tablet (1 mg total) by mouth daily. 06/18/23   Laneta Pintos, MD  HYDROcodone -acetaminophen  (NORCO/VICODIN) 5-325 MG tablet Take 1-2 tablets by mouth every 6 (six) hours as needed for moderate pain (pain score 4-6) or severe pain (pain score 7-10). 05/20/23   Amin, Ankit C, MD  hydrocortisone  cream 1 % Apply 1 application  topically daily as needed (rash).    [provider]  isosorbide  dinitrate (ISORDIL ) 20 MG tablet Take 1 tablet (20 mg total) by mouth 3 (three) times daily. 06/17/23   Laneta Pintos, MD  Multiple Vitamin (MULTIVITAMIN WITH MINERALS) TABS tablet Take 1 tablet by mouth daily. 05/21/23   Amin, Ankit C, MD  ondansetron  (ZOFRAN ) 4 MG tablet Dissolve 1 tablet (4 mg total) by mouth daily as needed for nausea or vomiting. 05/06/23 05/05/24  Shalhoub, Merrill Abide, MD  pantoprazole  (PROTONIX ) 40 MG tablet Take 1 tablet  (40 mg total) by mouth 2 (two) times daily. Patient taking differently: Take 40 mg by mouth 2 (two) times daily as needed. 05/20/23   Amin, Ankit C, MD  thiamine  (VITAMIN B-1) 100 MG tablet Take 1 tablet (100 mg total) by mouth daily. 06/17/23   Laneta Pintos, MD     Family History  Problem Relation Age of Onset   Hypertension Mother    Hypertension Father    Colon cancer Father 30   Diabetes Father    Pancreatic cancer Neg Hx    Esophageal cancer Neg Hx    Liver cancer Neg Hx    Stomach cancer Neg Hx     Social History   Socioeconomic History   Marital status: Single    Spouse name: Not on file   Number of children: Not on file   Years of education: Not on file   Highest education level: Not on file  Occupational History   Not on file  Tobacco Use   Smoking status: Never   Smokeless tobacco: Never  Vaping Use   Vaping status: Never Used  Substance and Sexual Activity   Alcohol use: Not Currently   Drug use: Yes    Types: Marijuana    Comment: daily    Sexual activity: Yes    Birth control/protection: Condom  Other Topics Concern   Not on file  Social History Narrative   Not on file   Social Drivers of Health   Financial Resource Strain: Unknown (04/23/2017)   Received from [facility], [facility]   Overall Financial Resource Strain (CARDIA)    Difficulty of Paying Living Expenses: Patient declined  Food Insecurity: No Food Insecurity (05/21/2023)   Hunger Vital Sign    Worried About Running Out of Food in the Last Year: Never true    Ran Out of Food in the Last Year: Never true  Transportation Needs: No Transportation Needs (05/21/2023)   PRAPARE - Administrator, Civil Service (Medical): No    Lack of Transportation (Non-Medical): No  Physical Activity: Unknown (04/23/2017)   Received from [facility], [facility]   Exercise Vital Sign    Days of Exercise per Week: Patient  declined    Minutes of Exercise per Session: Patient declined  Stress: Unknown (04/23/2017)   Received from [facility], [facility]   [employer] of Occupational Health - Occupational Stress Questionnaire    Feeling of Stress : Patient declined  Social Connections: Unknown (04/23/2017)   Received from [facility], [facility]   Social Connection and Isolation Panel [NHANES]    Frequency of Communication with Friends and Family: Patient declined    Frequency of Social Gatherings with Friends and Family: Patient declined    Attends Religious Services: Patient declined    Active Member of Clubs or Organizations: Patient declined    Attends Banker Meetings: Patient declined  Marital Status: Patient declined   Review of Systems: A 12 point ROS discussed and pertinent positives are indicated in the HPI above.  All other systems are negative.  Review of Systems  Vital Signs: BP (!) 142/92 (BP Location: Left Arm, Patient Position: Sitting, Cuff Size: Normal)   Pulse 78   Temp 98.3 F (36.8 C) (Oral)   Resp 16   SpO2 100%     Physical Exam Constitutional:      General: He is not in acute distress.    Appearance: Normal appearance. He is normal weight.  HENT:     Head: Normocephalic and atraumatic.  Eyes:     General: No scleral icterus. Cardiovascular:     Rate and Rhythm: Normal rate.  Pulmonary:     Effort: Pulmonary effort is normal.  Abdominal:     General: There is no distension.     Palpations: Abdomen is soft.     Tenderness: There is no abdominal tenderness. There is no guarding.  Skin:    General: Skin is warm and dry.  Neurological:     Mental Status: He is alert and oriented to person, place, and time.  Psychiatric:        Mood and Affect: Mood normal.        Behavior: Behavior normal.       Imaging: IR Radiologist Eval & Mgmt Result Date: 07/01/2023 EXAM:  ESTABLISHED PATIENT OFFICE VISIT CHIEF COMPLAINT: SEE NOTE IN EPIC HISTORY OF PRESENT ILLNESS: SEE NOTE IN EPIC REVIEW OF SYSTEMS: SEE NOTE IN EPIC PHYSICAL EXAMINATION: SEE NOTE IN EPIC ASSESSMENT AND PLAN: SEE NOTE IN EPIC Electronically Signed   By: Fernando Hoyer M.D.   On: 07/01/2023 14:41   IR EMBO ARTERIAL NOT HEMORR HEMANG INC GUIDE ROADMAPPING Result Date: 06/09/2023 INDICATION: 53 year old male with a history of chronic pancreatitis and an enlarging pancreaticoduodenal pseudoaneurysm. EXAM: IR EMBO ARTERIAL NOT HEMORR HEMANG INC GUIDE ROADMAPPING MEDICATIONS: None. ANESTHESIA/SEDATION: Moderate (conscious) sedation was employed during this procedure. A total of Versed  3 mg and Fentanyl  150 mcg was administered intravenously by the Radiology nurse. Moderate Sedation Time: 92 minutes. The patient's level of consciousness and vital signs were monitored continuously by radiology nursing throughout the procedure under my direct supervision. CONTRAST:  100mL OMNIPAQUE  IOHEXOL  300 MG/ML  SOLN FLUOROSCOPY: Radiation Exposure Index (as provided by the fluoroscopic device): 3268 mGy Kerma COMPLICATIONS: None immediate. PROCEDURE: Informed consent was obtained from the patient following explanation of the procedure, risks, benefits and alternatives. The patient understands, agrees and consents for the procedure. All questions were addressed. A time out was performed prior to the initiation of the procedure. Maximal barrier sterile technique utilized including caps, mask, sterile gowns, sterile gloves, large sterile drape, hand hygiene, and Betadine prep. The right common femoral artery was interrogated with ultrasound and found to be widely patent. An image was obtained and stored for the medical record. Local anesthesia was attained by infiltration with 1% lidocaine . A small dermatotomy was made. Under real-time sonographic guidance, the vessel was punctured with a 21 gauge micropuncture needle. Using standard  technique, the initial micro needle was exchanged over a 0.018 micro wire for a transitional 4 [place] micro sheath. The micro sheath was then exchanged over a 0.035 wire for a 5 French vascular sheath. A C2 cobra catheter was advanced over a Bentson wire into the abdominal aorta. The origin of the celiac artery could not be catheterized. The SMA was successfully catheterized. Arteriography was performed. Massively hypertrophic posterior and  anterior inferior pancreaticoduodenal arteries which provides collateral supply to the celiac axis. There is filling of the pseudoaneurysm on the initial angiogram. This appears to be arising from 1 of the 2 inferior pancreaticoduodenal arteries. Based on the prior CT imaging, the posteroinferior pancreaticoduodenal artery is most likely. A Progreat microcatheter was advanced over a Fathom 16 wire in used to select 1 of the pancreaticoduodenal arteries. Contrast injection was performed. No evidence of pseudoaneurysm filling. This vessel represents the anterior inferior pancreaticoduodenal artery. The microcatheter was next used to select the second pancreaticoduodenal artery. Contrast injection was performed. There is positive filling of the pseudoaneurysm. This vessel represents the posteroinferior pancreaticoduodenal artery. The microcatheter was advanced along a guidewire. Initially, the microcatheter and guidewire slipped through the vessel injury and into the pseudoaneurysm sac itself. Contrast injection was performed confirming that the microcatheter is within the pseudoaneurysm. The microcatheter was brought back into the healthy vessel lumen and carefully navigated beyond the vessel injury into the more distal aspect of the inferior posterior pancreaticoduodenal artery. Contrast was injected confirming that the catheter is within the healthy vessel distal to the arterial injury. Coil embolization was then performed using a series of penumbra detachable microcoils. Coil  embolization was performed both distally, across the vessel injury and more proximally within the posteroinferior pancreaticoduodenal artery. Final contrast injection was performed confirming no flow passing through the occluded segment of the artery and no flow within the pseudoaneurysm. There is adequate flow through the anterior inferior pancreaticoduodenal artery with persistent opacification of the celiac axis. IMPRESSION: 1. 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 2. Large pseudoaneurysm arising from the posteroinferior pancreaticoduodenal artery. 3. Successful coil embolization of the injured segment of the posteroinferior pancreaticoduodenal artery with no further opacification of the pseudoaneurysm. Electronically Signed   By: Fernando Hoyer M.D.   On: 06/09/2023 16:02   IR Radiologist Eval & Mgmt Result Date: 06/08/2023 EXAM: NEW PATIENT OFFICE VISIT CHIEF COMPLAINT: SEE EPIC NOTE HISTORY OF PRESENT ILLNESS: SEE EPIC NOTE REVIEW OF SYSTEMS: SEE EPIC NOTE PHYSICAL EXAMINATION: SEE EPIC NOTE ASSESSMENT AND PLAN: SEE EPIC NOTE Electronically Signed   By: Fernando Hoyer M.D.   On: 06/08/2023 11:18   CT ABDOMEN PELVIS W CONTRAST Result Date: 06/04/2023 EXAMINATION: CT ABDOMEN PELVIS W CONTRAST CLINICAL INDICATION: Male, 53 years old. F/U pancreatitis TECHNIQUE: Axial CT of the abdomen and pelvis with 100 cc Omnipaque  300 intravenous contrast. Multiplanar reformations provided. Unless otherwise specified, incidental thyroid , adrenal, renal lesions do not require dedicated imaging follow up. Additionally, any mentioned pulmonary nodules do not require dedicated imaging follow-up based on the Fleischner guidelines unless otherwise specified. Coronary calcifications are not identified unless otherwise  specified. COMPARISON: 05/16/2023 FINDINGS: The lung bases are clear. The heart is normal in size. The liver contains a subcentimeter probable cyst. The gallbladder is normal. The spleen is normal. 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 There is interval growth of a pseudoaneurysm which appears to originate from a pancreaticoduodenal branch of the GDA now measuring 2.8 cm (previously measuring 10 mm). The adrenals are normal. Left kidney is normal. Right renal cysts are seen. The abdominal aorta is normal in caliber. Scattered atherosclerotic changes are present. Bladder is normal. The prostate is enlarged. Large and small bowel loops are otherwise within normal limits. There is no free fluid  or pathologic lymphadenopathy by size criteria. Right hip arthroplasty is noted. There are degenerative changes of the spine and bony pelvis. IMPRESSION: Persistent findings of acute on chronic interstitial pancreatitis involving the pancreatic head with significant interval growth of a pseudoaneurysm which appears to originate from a pancreaticoduodenal branch of the GDA which now measures 2.8 cm. Prompt referral to a vascular specialist is recommended. DOSE REDUCTION: This exam was performed according to our departmental dose-optimization program which includes automated exposure control, adjustment of the mA and/or kV according to patient size and/or use of iterative reconstruction technique. Electronically signed by: Italy Engel MD 06/04/2023 11:48 AM EDT RP Workstation: [id number]    Labs:  CBC: Recent Labs    05/20/23 0536 05/27/23 1056 06/01/23 0923 06/09/23 1131  WBC 6.4 6.0 7.4 4.2  HGB 11.5* 10.8* 10.6* 12.3*  HCT 34.7* 31.2* 31.0* 36.5*  PLT 338 529.0* 495.0* 398    COAGS: Recent Labs    05/16/23 1927 06/09/23 1131  INR 1.1 1.0    BMP: Recent Labs    05/18/23 0458 05/19/23 0501 05/20/23 0536 05/27/23 1056 06/01/23 0923  06/04/23 0948 06/09/23 1131 06/21/23 0938  NA 132* 134* 133*   < > 136 139 136 138  K 3.8 3.7 3.4*   < > 4.0 3.9 3.8 4.0  CL 100 100 102   < > 106 104 105 104  CO2 23 23 18*   < > 24 17* 24 25  GLUCOSE 133* 114* 167*   < > 102* 151* 85 125*  BUN 9 12 13    < > 12 11 15 13   CALCIUM 9.1 9.4 9.3   < > 9.2 9.6 9.5 10.0  CREATININE 0.63 0.83 0.61   < > 0.73 0.73* 0.92 0.74  GFRNONAA >60 >60 >60  --   --   --  >60  --    < > = values in this interval not displayed.    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  He is now 2 weeks post coil embolization and doing very well without complication.   1.) CTA abdomen followed by a clinic visit in 3 months (August 2025).  Electronically Signed: Roxie Cord 07/01/2023, 2:48 PM   I spent a total of 15 Minutes in face to face in clinical consultation, greater than 50% of which was counseling/coordinating care for pancreaticoduodenal arcade pseudoaneurysm

## 2023-07-10 ENCOUNTER — Other Ambulatory Visit: Payer: Self-pay | Admitting: Gastroenterology

## 2023-07-12 DIAGNOSIS — K860 Alcohol-induced chronic pancreatitis: Secondary | ICD-10-CM | POA: Diagnosis not present

## 2023-07-12 DIAGNOSIS — K8689 Other specified diseases of pancreas: Secondary | ICD-10-CM | POA: Diagnosis not present

## 2023-07-15 ENCOUNTER — Telehealth: Payer: Self-pay

## 2023-07-15 ENCOUNTER — Telehealth: Payer: Self-pay | Admitting: Family Medicine

## 2023-07-15 NOTE — Telephone Encounter (Signed)
 Spoke with patient over the telephone. He is requesting a note to periodically excuse him from work to eat so that he can take his CREON  as it must be taken with food. Note written and sent to patient via MyChart.   Odilia Bennett, PA-C

## 2023-07-15 NOTE — Telephone Encounter (Signed)
 Copied from CRM [id number]. Topic: Clinical - Medication Question >> Jul 15, 2023 10:52 AM Carlatta H wrote: Reason for CRM: Patient is requesting a note for work from doctor stating he needs to leave and get food/nutrients so that he can take medication at appropriate times//Please call patient//

## 2023-07-16 NOTE — Telephone Encounter (Signed)
 Called pt LVM to contact the office

## 2023-07-19 ENCOUNTER — Telehealth: Payer: Self-pay | Admitting: Family Medicine

## 2023-07-19 MED ORDER — HYDROCODONE-ACETAMINOPHEN 5-325 MG PO TABS: 1.0000 | ORAL_TABLET | Freq: Four times a day (QID) | ORAL | 0 refills | Status: AC | PRN

## 2023-07-19 NOTE — Telephone Encounter (Signed)
 Copied from CRM [id number]. Topic: Clinical - Medication Refill >> Jul 19, 2023  8:53 AM Lynnie Saucier S wrote: Medication: HYDROcodone -acetaminophen  (NORCO/VICODIN) 5-325 MG tablet  Has the patient contacted their pharmacy? Yes (Agent: If no, request that the patient contact the pharmacy for the refill. If patient does not wish to contact the pharmacy document the reason why and proceed with request.) (Agent: If yes, when and what did the pharmacy advise?)no response  This is the patient's preferred pharmacy:  [facility] [address] [place] - [address] [place] [address] Phone: [phone] Fax: [phone]  Is this the correct pharmacy for this prescription? Yes If no, delete pharmacy and type the correct one.   Has the prescription been filled recently? No  Is the patient out of the medication? No  Has the patient been seen for an appointment in the last year OR does the patient have an upcoming appointment? Yes  Can we respond through MyChart? Yes  Agent: Please be advised that Rx refills may take up to 3 business days. We ask that you follow-up with your pharmacy.

## 2023-07-22 ENCOUNTER — Telehealth: Payer: Self-pay

## 2023-07-22 ENCOUNTER — Encounter: Payer: Self-pay | Admitting: Family Medicine

## 2023-07-22 NOTE — Telephone Encounter (Signed)
 Copied from CRM [id number]. Topic: General - Other >> Jul 22, 2023 11:28 AM Evan Sullivan wrote: Reason for CRM: checking to see if forms showed up in mychart, please fill out and send back through mychart if possible

## 2023-07-23 NOTE — Telephone Encounter (Signed)
 Forms filled out and ready to be sent through mychart

## 2023-07-26 NOTE — Telephone Encounter (Signed)
 Paperwork has been sent

## 2023-08-13 ENCOUNTER — Encounter: Payer: Self-pay | Admitting: Family Medicine

## 2023-08-13 ENCOUNTER — Ambulatory Visit (INDEPENDENT_AMBULATORY_CARE_PROVIDER_SITE_OTHER): Admitting: Family Medicine

## 2023-08-13 VITALS — BP 117/71 | HR 81 | Ht 73.0 in | Wt 161.4 lb

## 2023-08-13 DIAGNOSIS — E1169 Type 2 diabetes mellitus with other specified complication: Secondary | ICD-10-CM

## 2023-08-13 DIAGNOSIS — E782 Mixed hyperlipidemia: Secondary | ICD-10-CM | POA: Diagnosis not present

## 2023-08-13 DIAGNOSIS — E119 Type 2 diabetes mellitus without complications: Secondary | ICD-10-CM

## 2023-08-13 DIAGNOSIS — K860 Alcohol-induced chronic pancreatitis: Secondary | ICD-10-CM

## 2023-08-13 DIAGNOSIS — K8689 Other specified diseases of pancreas: Secondary | ICD-10-CM | POA: Insufficient documentation

## 2023-08-13 DIAGNOSIS — F1010 Alcohol abuse, uncomplicated: Secondary | ICD-10-CM | POA: Diagnosis not present

## 2023-08-13 LAB — POCT GLYCOSYLATED HEMOGLOBIN (HGB A1C): HbA1c POC (<> result, manual entry): 6.9 % (ref 4.0–5.6)

## 2023-08-13 NOTE — Assessment & Plan Note (Signed)
 No alcohol in over a year

## 2023-08-13 NOTE — Assessment & Plan Note (Signed)
 History of chronic pancreatitis with significant malabsorption issues. Currently managing with Creon , though has self-adjusted dose to one capsule per meal. Discussed need for pancreatic enzymes to digest food, particularly fats. Concerned about ongoing weight loss. - Continue Creon  with meals. Advised to discuss dosing with GI specialist next month. - Counseled on taking an extra Creon  capsule if eating higher-fat foods, like a hamburger, to aid digestion. - Discussed diet for weight gain. Advised to identify foods that do not trigger symptoms. Encouraged softer, pureed-type foods. Advised chicken has more calories than fish. Advised to avoid red meats like steak. - Will continue to monitor weight.

## 2023-08-13 NOTE — Assessment & Plan Note (Signed)
-   Continue diet control. - Counseled on avoiding high-sugar foods/drinks and simple carbohydrates to manage blood sugar, as medication options are limited to insulin  if diet fails due to pancreatic dysfunction.

## 2023-08-13 NOTE — Progress Notes (Signed)
 Established Patient Office Visit  Subjective   Patient ID: Evan Sullivan, male    DOB: 05-Jul-1970  Age: 53 y.o. MRN: [id number]  Chief Complaint  Patient presents with   Medical Management of Chronic Issues    HPI  Subjective - Follow-up for multiple issues, primarily chronic pancreatitis. Discussed medications, diet, and a letter for work. - Reports not returning to the hospital since last visit in May. Has had video visits with the specialist in [facility] and has an in-person appointment next month. - Questions about the necessity of continuing multivitamin, thiamine , and folic acid . States has not consumed alcohol in over a year. - Reports cutting back Creon  dose from two capsules to one per meal. Feels this is adequate and can tell when a dose is needed. Acknowledges not taking it with snacks between meals. - Discussed weight loss, currently 161 lbs, down from 200 lbs. Inquires about how to gain weight. Reports eating primarily lean foods like fish and chicken. Cannot tolerate milk products like Ensure. - Inquires about a letter for work, needing wording changed from briefly step away from work to leave the premises.  Medications Current medications discussed include: multivitamin, thiamine , folic acid , isosorbide  (Isordil ) three times daily, amlodipine , fenofibrate  (Tricor ) daily, Creon  with meals, and ondansetron  (Zofran ) as needed. Reports taking five pills every morning.  PMH, PSH, FH, Social Hx PMHx: Chronic pancreatitis with malabsorption, hypertension, diet-controlled diabetes (Type 2 implied), hyperlipidemia. Social Hx: Reports quitting alcohol over one year ago.  ROS Constitutional: Reports weight loss. Denies new issues. GI: Discussed malabsorption concerns. No acute diarrhea mentioned, but discussed potential for steatorrhea with high-fat foods. No nausea reported at present.    The 10-year ASCVD risk score (Arnett DK, et al., 2019) is: 13.7%  Health  Maintenance Due  Topic Date Due   Hepatitis C Screening  Never done   DTaP/Tdap/Td (1 - Tdap) Never done   Hepatitis B Vaccines (1 of 3 - 19+ 3-dose series) Never done   COVID-19 Vaccine (3 - Pfizer risk series) 01/31/2020   Diabetic kidney evaluation - Urine ACR  03/26/2023   OPHTHALMOLOGY EXAM  04/09/2023      Objective:     BP 117/71   Pulse 81   Ht 6' 1 (1.854 m)   Wt 161 lb 6.4 oz (73.2 kg)   SpO2 100%   BMI 21.29 kg/m    Physical Exam Gen: alert, oriented Pulm: no resp distress Ext: normal sensation.  Pulses equal b/l.  Healed skin ulcer over left 5th metatarsal    Results for orders placed or performed in visit on 08/13/23  POCT glycosylated hemoglobin (Hb A1C)  Result Value Ref Range   Hemoglobin A1C     HbA1c POC (<> result, manual entry) 6.9 4.0 - 5.6 %   HbA1c, POC (prediabetic range)     HbA1c, POC (controlled diabetic range)          Assessment & Plan:   Type 2 diabetes mellitus without complication, without long-term current use of insulin  (HCC) Assessment & Plan: - Continue diet control. - Counseled on avoiding high-sugar foods/drinks and simple carbohydrates to manage blood sugar, as medication options are limited to insulin  if diet fails due to pancreatic dysfunction.  Orders: -     POCT glycosylated hemoglobin (Hb A1C) -     Microalbumin / creatinine urine ratio  Mixed diabetic hyperlipidemia associated with type 2 diabetes mellitus (HCC) -     POCT glycosylated hemoglobin (Hb A1C) -  Microalbumin / creatinine urine ratio -     Lipid panel; Future  Pancreatic insufficiency Assessment & Plan: - Counseled that due to malabsorption from pancreatitis, continued vitamin supplementation is recommended to be safe. - Advised that if they wish to decrease pill burden, they may stop thiamine  and folic acid  but continue the multivitamin daily. - Plan to start routine monitoring of vitamin levels, including Vitamin D and B12, before next  visit.  Orders: -     B12 and Folate Panel; Future -     VITAMIN D 25 Hydroxy (Vit-D Deficiency, Fractures); Future -     Magnesium ; Future -     Comprehensive metabolic panel with GFR; Future -     Vitamin A; Future -     Vitamin E; Future -     Vitamin K1, Serum; Future  Alcohol abuse Assessment & Plan: No alcohol in over a year   Alcohol-induced chronic pancreatitis (HCC) Assessment & Plan:  History of chronic pancreatitis with significant malabsorption issues. Currently managing with Creon , though has self-adjusted dose to one capsule per meal. Discussed need for pancreatic enzymes to digest food, particularly fats. Concerned about ongoing weight loss. - Continue Creon  with meals. Advised to discuss dosing with GI specialist next month. - Counseled on taking an extra Creon  capsule if eating higher-fat foods, like a hamburger, to aid digestion. - Discussed diet for weight gain. Advised to identify foods that do not trigger symptoms. Encouraged softer, pureed-type foods. Advised chicken has more calories than fish. Advised to avoid red meats like steak. - Will continue to monitor weight.      Return in about 3 months (around 11/13/2023) for DM.    Evan MARLA Slain, MD

## 2023-08-13 NOTE — Assessment & Plan Note (Signed)
-   Counseled that due to malabsorption from pancreatitis, continued vitamin supplementation is recommended to be safe. - Advised that if they wish to decrease pill burden, they may stop thiamine  and folic acid  but continue the multivitamin daily. - Plan to start routine monitoring of vitamin levels, including Vitamin D and B12, before next visit.

## 2023-08-13 NOTE — Patient Instructions (Signed)
 It was nice to see you today,  We addressed the following topics today: -Your A1c was 6.9.  We can continue trying to monitor it and control with diet. - I will rewrite your letter. - I would prefer if you took folic acid  and thiamine  but if you want to I think it would be okay to stop those for now but please continue taking the multivitamin. - Try to limit high-fat foods which would include things like hamburger.  Experiment with different foods and eat more the things that are gentle on your stomach and less have things that cause stomach upset. - I would avoid things like steak that are difficult to digest. - Your blood pressure was good.  Please continue taking your blood pressure medication.  Have a great day,  Rolan Slain, MD

## 2023-08-14 LAB — MICROALBUMIN / CREATININE URINE RATIO
Creatinine, Urine: 252.3 mg/dL
Microalb/Creat Ratio: 272 mg/g{creat} — ABNORMAL HIGH (ref 0–29)
Microalbumin, Urine: 685.9 ug/mL

## 2023-08-16 ENCOUNTER — Ambulatory Visit: Payer: Self-pay | Admitting: Family Medicine

## 2023-09-08 ENCOUNTER — Other Ambulatory Visit

## 2023-09-08 DIAGNOSIS — K8689 Other specified diseases of pancreas: Secondary | ICD-10-CM | POA: Diagnosis not present

## 2023-09-08 DIAGNOSIS — K860 Alcohol-induced chronic pancreatitis: Secondary | ICD-10-CM | POA: Diagnosis not present

## 2023-09-09 ENCOUNTER — Telehealth: Payer: Self-pay

## 2023-09-09 NOTE — Telephone Encounter (Signed)
 Copied from CRM [id number]. Topic: Clinical - Medication Question >> Sep 09, 2023  2:44 PM Emylou G wrote: Reason for CRM: Patient called.. wants to know if he can increase lipase/protease/amylase (CREON ) 36000 UNITS CPEP capsule?  Due to his recent visit.

## 2023-09-10 NOTE — Telephone Encounter (Signed)
 At our visit he had decreased his creon  from 2 capsules to 1 capsule per meal.  Is he asking if he can go back up to 2 capsules per meal?  If so, then yes he can increase it.

## 2023-09-13 ENCOUNTER — Telehealth: Payer: Self-pay

## 2023-09-13 ENCOUNTER — Encounter: Payer: Self-pay | Admitting: Family Medicine

## 2023-09-13 NOTE — Telephone Encounter (Signed)
 Called pt LVM to contact the office if pt call please advised

## 2023-09-13 NOTE — Telephone Encounter (Signed)
 I am going to send him a mychart message to clarify.

## 2023-09-13 NOTE — Telephone Encounter (Signed)
 Copied from CRM [id number]. Topic: Clinical - Medication Question >> Sep 09, 2023  2:44 PM Emylou G wrote: Reason for CRM: Patient called.. wants to know if he can increase lipase/protease/amylase (CREON ) 36000 UNITS CPEP capsule?  Due to his recent visit. >> Sep 13, 2023 10:15 AM Montie POUR wrote: Evan Sullivan is taking two capsules 3 times daily and he is asking if he can take 1 capsule in between breakfast and lunch and 1 capsule between lunch and dinner. Please call him at [phone]. Thanks

## 2023-09-14 ENCOUNTER — Other Ambulatory Visit: Payer: Self-pay | Admitting: Family Medicine

## 2023-09-14 MED ORDER — PANCRELIPASE (LIP-PROT-AMYL) 36000-114000 UNITS PO CPEP: ORAL_CAPSULE | ORAL | 5 refills | Status: AC

## 2023-09-16 ENCOUNTER — Telehealth: Payer: Self-pay | Admitting: *Deleted

## 2023-09-16 NOTE — Telephone Encounter (Signed)
 Copied from CRM [id number]. Topic: Clinical - Medical Advice >> Sep 16, 2023  3:37 PM Sasha H wrote: Reason for CRM: Pt wants to know if he should be fasting for bloodwork , please give pt a call.

## 2023-09-16 NOTE — Telephone Encounter (Signed)
 Informed pt to fast for lipid

## 2023-09-19 ENCOUNTER — Other Ambulatory Visit: Payer: Self-pay | Admitting: Family Medicine

## 2023-09-19 DIAGNOSIS — E781 Pure hyperglyceridemia: Secondary | ICD-10-CM

## 2023-10-24 ENCOUNTER — Other Ambulatory Visit: Payer: Self-pay | Admitting: Family Medicine

## 2023-10-24 DIAGNOSIS — I10 Essential (primary) hypertension: Secondary | ICD-10-CM

## 2023-10-26 DIAGNOSIS — K860 Alcohol-induced chronic pancreatitis: Secondary | ICD-10-CM | POA: Diagnosis not present

## 2023-10-26 DIAGNOSIS — K8689 Other specified diseases of pancreas: Secondary | ICD-10-CM | POA: Diagnosis not present

## 2023-10-26 DIAGNOSIS — E782 Mixed hyperlipidemia: Secondary | ICD-10-CM | POA: Diagnosis not present

## 2023-10-26 DIAGNOSIS — E1169 Type 2 diabetes mellitus with other specified complication: Secondary | ICD-10-CM | POA: Diagnosis not present

## 2023-10-26 DIAGNOSIS — N281 Cyst of kidney, acquired: Secondary | ICD-10-CM | POA: Diagnosis not present

## 2023-10-26 DIAGNOSIS — K838 Other specified diseases of biliary tract: Secondary | ICD-10-CM | POA: Diagnosis not present

## 2023-11-03 LAB — LIPID PANEL
Chol/HDL Ratio: 2.3 ratio (ref 0.0–5.0)
Cholesterol, Total: 164 mg/dL (ref 100–199)
HDL: 71 mg/dL (ref >39)
LDL Chol Calc (NIH): 81 mg/dL (ref 0–99)
Triglycerides: 60 mg/dL (ref 0–149)
VLDL Cholesterol Cal: 12 mg/dL (ref 5–40)

## 2023-11-03 LAB — VITAMIN D 25 HYDROXY (VIT D DEFICIENCY, FRACTURES): Vit D, 25-Hydroxy: 28.7 ng/mL — ABNORMAL LOW (ref 30.0–100.0)

## 2023-11-03 LAB — B12 AND FOLATE PANEL
Folate: >20 ng/mL (ref >3.0)
Vitamin B-12: 1149 pg/mL (ref 232–1245)

## 2023-11-03 LAB — COMPREHENSIVE METABOLIC PANEL WITH GFR
ALT: 16 IU/L (ref 0–44)
AST: 18 IU/L (ref 0–40)
Albumin: 4.5 g/dL (ref 3.8–4.9)
Alkaline Phosphatase: 115 IU/L (ref 47–123)
BUN/Creatinine Ratio: 17 (ref 9–20)
BUN: 16 mg/dL (ref 6–24)
Bilirubin Total: 0.4 mg/dL (ref 0.0–1.2)
CO2: 21 mmol/L (ref 20–29)
Calcium: 10.3 mg/dL — ABNORMAL HIGH (ref 8.7–10.2)
Chloride: 104 mmol/L (ref 96–106)
Creatinine, Ser: 0.94 mg/dL (ref 0.76–1.27)
Globulin, Total: 2.8 g/dL (ref 1.5–4.5)
Glucose: 103 mg/dL — ABNORMAL HIGH (ref 70–99)
Potassium: 4.8 mmol/L (ref 3.5–5.2)
Sodium: 140 mmol/L (ref 134–144)
Total Protein: 7.3 g/dL (ref 6.0–8.5)
eGFR: 98 mL/min/1.73 (ref >59)

## 2023-11-03 LAB — VITAMIN A: Vitamin A: 58.7 ug/dL (ref 20.1–62.0)

## 2023-11-03 LAB — MAGNESIUM: Magnesium: 2.2 mg/dL (ref 1.6–2.3)

## 2023-11-03 LAB — VITAMIN K1, SERUM: VITAMIN K1: 0.21 ng/mL (ref 0.10–2.20)

## 2023-11-03 LAB — VITAMIN E
Vitamin E (Alpha Tocopherol): 15.4 mg/L (ref 7.0–25.1)
Vitamin E(Gamma Tocopherol): 0.7 mg/L (ref 0.5–5.5)

## 2023-11-16 ENCOUNTER — Encounter: Payer: Self-pay | Admitting: Family Medicine

## 2023-11-16 ENCOUNTER — Ambulatory Visit: Admitting: Family Medicine

## 2023-11-16 VITALS — BP 152/84 | HR 65 | Ht 73.0 in | Wt 173.1 lb

## 2023-11-16 DIAGNOSIS — E782 Mixed hyperlipidemia: Secondary | ICD-10-CM

## 2023-11-16 DIAGNOSIS — E1169 Type 2 diabetes mellitus with other specified complication: Secondary | ICD-10-CM | POA: Diagnosis not present

## 2023-11-16 DIAGNOSIS — E119 Type 2 diabetes mellitus without complications: Secondary | ICD-10-CM

## 2023-11-16 DIAGNOSIS — K860 Alcohol-induced chronic pancreatitis: Secondary | ICD-10-CM

## 2023-11-16 DIAGNOSIS — I10 Essential (primary) hypertension: Secondary | ICD-10-CM | POA: Diagnosis not present

## 2023-11-16 LAB — POCT GLYCOSYLATED HEMOGLOBIN (HGB A1C): HbA1c POC (<> result, manual entry): 7.4 % (ref 4.0–5.6)

## 2023-11-16 MED ORDER — OLMESARTAN MEDOXOMIL 20 MG PO TABS: 20.0000 mg | ORAL_TABLET | Freq: Every day | ORAL | 2 refills | Status: DC

## 2023-11-16 NOTE — Progress Notes (Signed)
 Established Patient Office Visit  Subjective   Patient ID: Evan Sullivan, male    DOB: January 13, 1971  Age: 53 y.o. MRN: [id number]  Chief Complaint  Patient presents with   Medical Management of Chronic Issues    HPI Subjective - Ongoing stomach problems, now primarily constipation. Reports no issues with acid reflux since May. Has not taken pantoprazole  for 5 months. - Gaining weight, from 163 lbs to 173 lbs. Eating more. - Following up on MRI from 10/26/2023 at [facility].  Medications Current medications include Creon , multivitamin, amlodipine , fenofibrate , and isosorbide . Pantoprazole  40mg  is not being taken. Atenolol  was previously discontinued. Lisinopril  was prescribed in the past but not taken, possibly due to side effects. Takes Creon  two capsules with meals, and sometimes one with heavy snacks, but is still adjusting the dose. Takes two blood pressure pills (amlodipine  and isosorbide ) and a fenofibrate  for cholesterol.  PMH, PSH, FH, Social Hx PMHx: Chronic pancreatitis, hypertension, hyperlipidemia, history of acid reflux. History of emergency surgery in May for a vascular issue, described as a vein that was about to bust. PSH: Emergency surgery in May. Social Hx: Reports two years of alcohol sobriety.  ROS GI: Reports constipation, managed with Miralax . Denies acid reflux symptoms. Reports normal burping. Cardiovascular: Denies chest pain or angina symptoms.   The 10-year ASCVD risk score (Arnett DK, et al., 2019) is: 20.3%  Health Maintenance Due  Topic Date Due   Hepatitis C Screening  Never done   DTaP/Tdap/Td (1 - Tdap) Never done   Pneumococcal Vaccine: 50+ Years (1 of 2 - PCV) Never done   Hepatitis B Vaccines 19-59 Average Risk (1 of 3 - 19+ 3-dose series) Never done   Zoster Vaccines- Shingrix (1 of 2) Never done   COVID-19 Vaccine (3 - Pfizer risk series) 01/31/2020   OPHTHALMOLOGY EXAM  04/09/2023      Objective:     BP (!) 152/84   Pulse 65   Ht  6' 1 (1.854 m)   Wt 173 lb 1.9 oz (78.5 kg)   SpO2 100%   BMI 22.84 kg/m    Physical Exam Gen: alert, oriented Pulm: no respiratory distress Psych: pleasant affect   Results for orders placed or performed in visit on 11/16/23  POCT glycosylated hemoglobin (Hb A1C)  Result Value Ref Range   Hemoglobin A1C     HbA1c POC (<> result, manual entry) 7.4 4.0 - 5.6 %   HbA1c, POC (prediabetic range)     HbA1c, POC (controlled diabetic range)          Assessment & Plan:   Type 2 diabetes mellitus without complication, without long-term current use of insulin  (HCC) Assessment & Plan: - A1c is 7.4, slightly up from 6.9 last time. Is diet controlled.  Continue diet control and counseled on improving diet by reducing sweets.   - Check A1c again in 3 months. - if medications aree needed, metformin would be recommended, insulin  may be necessary though with his chronic pancreatitis. Would avoid ddp-4 and glp-1s given pancreatitis  Orders: -     POCT glycosylated hemoglobin (Hb A1C)  Mixed diabetic hyperlipidemia associated with type 2 diabetes mellitus (HCC) -     POCT glycosylated hemoglobin (Hb A1C)  Alcohol-induced chronic pancreatitis (HCC) Assessment & Plan: - History of chronic pancreatitis with ongoing management. Recent MRI on 10/26/2023 showed findings consistent with chronic pancreatitis, including pseudocysts. No evidence of bile duct blockage, though duct is mildly dilated. Liver and gallbladder appeared normal. Small intestine  showed some inflammation, possibly from acid reflux, though patient denies current symptoms. Currently seeing a nutritionist at [facility] and reports weight gain. - Continue Creon  as directed. - Follow up with nutritionist as scheduled.   Essential hypertension Assessment & Plan: - Elevated blood pressure on examination. Currently on amlodipine  and isosorbide  three times daily. Isosorbide  was started in the hospital. Reports disliking the  three-times-a-day dosing. - Discontinue thiamine  and folic acid  as they are included in the multivitamin and pt wishes to reduce his pill burden. - Add olmesartan once daily in the morning. - Wean off isosorbide : Take twice daily (morning and lunchtime) for 1 week, then once daily (morning) for 1 week, then stop. - Continue amlodipine  and fenofibrate  as prescribed. - Continue multivitamin once daily. - Check blood pressure at home twice daily (morning and afternoon/evening) and record on log sheet. - Follow up in 4-6 weeks to review blood pressure log and adjust medications.   Other orders -     Olmesartan Medoxomil; Take 1 tablet (20 mg total) by mouth daily.  Dispense: 30 tablet; Refill: 2     Return in about 3 months (around 02/16/2024) for DM, HTN.    Toribio MARLA Slain, MD

## 2023-11-16 NOTE — Patient Instructions (Signed)
 It was nice to see you today,  We addressed the following topics today: -I am adding a new blood pressure medication called olmesartan for you to take once a day.  After you pick up your olmesartan from the pharmacy and start taking it I would like you to do the following: 1.  Start taking your Isordil  at breakfast and lunch and skipping the dinner dose for 1 week 2.  Start taking your Isordil  only at lunch for 1 week.  Skip the breakfast and dinner dose. 3.  Stop taking your Isordil  completely 4.  Continue taking your amlodipine  and olmesartan every day. - Check your blood pressure twice a day, before you go to work and then again in the evening when you get back from work.  Write this down and bring it back to us  after you have finished your Isordil  for at least 1 week.  Have a great day,  Rolan Slain, MD

## 2023-11-19 NOTE — Assessment & Plan Note (Signed)
-   A1c is 7.4, slightly up from 6.9 last time. Is diet controlled.  Continue diet control and counseled on improving diet by reducing sweets.   - Check A1c again in 3 months. - if medications aree needed, metformin would be recommended, insulin  may be necessary though with his chronic pancreatitis. Would avoid ddp-4 and glp-1s given pancreatitis

## 2023-11-19 NOTE — Progress Notes (Signed)
 Evan Sullivan                                          MRN: [id number]   11/19/2023   The [facility] Quality Team Specialist reviewed this patient medical record for the purposes of chart review for care gap closure. The following were reviewed: abstraction for care gap closure-glycemic status assessment.    [facility] Quality Team

## 2023-11-19 NOTE — Assessment & Plan Note (Signed)
-   History of chronic pancreatitis with ongoing management. Recent MRI on 10/26/2023 showed findings consistent with chronic pancreatitis, including pseudocysts. No evidence of bile duct blockage, though duct is mildly dilated. Liver and gallbladder appeared normal. Small intestine showed some inflammation, possibly from acid reflux, though patient denies current symptoms. Currently seeing a nutritionist at [facility] and reports weight gain. - Continue Creon  as directed. - Follow up with nutritionist as scheduled.

## 2023-11-19 NOTE — Assessment & Plan Note (Signed)
-   Elevated blood pressure on examination. Currently on amlodipine  and isosorbide  three times daily. Isosorbide  was started in the hospital. Reports disliking the three-times-a-day dosing. - Discontinue thiamine  and folic acid  as they are included in the multivitamin and pt wishes to reduce his pill burden. - Add olmesartan once daily in the morning. - Wean off isosorbide : Take twice daily (morning and lunchtime) for 1 week, then once daily (morning) for 1 week, then stop. - Continue amlodipine  and fenofibrate  as prescribed. - Continue multivitamin once daily. - Check blood pressure at home twice daily (morning and afternoon/evening) and record on log sheet. - Follow up in 4-6 weeks to review blood pressure log and adjust medications.

## 2023-12-03 ENCOUNTER — Other Ambulatory Visit: Payer: Self-pay | Admitting: Family Medicine

## 2023-12-18 ENCOUNTER — Other Ambulatory Visit: Payer: Self-pay | Admitting: Family Medicine

## 2023-12-18 DIAGNOSIS — E781 Pure hyperglyceridemia: Secondary | ICD-10-CM

## 2024-01-07 ENCOUNTER — Encounter: Payer: Self-pay | Admitting: Family Medicine

## 2024-01-07 ENCOUNTER — Ambulatory Visit: Payer: Self-pay

## 2024-01-07 ENCOUNTER — Telehealth: Payer: Self-pay

## 2024-01-07 NOTE — Telephone Encounter (Signed)
 Called pt Evan Sullivan to contact the office if pt calls please advise pt is requesting  an appointment

## 2024-01-07 NOTE — Telephone Encounter (Signed)
 FYI Only or Action Required?: FYI only for provider: appointment scheduled on 01/11/24. No earlier appt available.   Patient was last seen in primary care on 11/16/2023 by Evan Toribio POUR, MD.  Called Nurse Triage reporting Urinary Frequency and Blurred Vision.  Symptoms began a week ago. Blurred vision yesterday.  Interventions attempted: Rest, hydration, or home remedies.  Symptoms are: unchanged.  Triage Disposition: See Physician Within 24 Hours  Patient/caregiver understands and will follow disposition?: Yes  Reason for Disposition  Urinating more frequently than usual (i.e., frequency) OR new-onset of the feeling of an urgent need to urinate (i.e., urgency)  [1] Brief (now gone) blurred vision AND [2] unexplained  Answer Assessment - Initial Assessment Questions Pt states he noticed some changes to his vision yesterday that comes and goes. States he would like to have a vision exam to get this checked because it's been about 2 years since his last one and he's concerned this may be related to Diabetes. Pt appt scheduled 01/11/24 for urinary frequency, but would like to discuss this matter as well. Pt is also requesting a refill for his Creon  to be sent to [facility].   1. DESCRIPTION: How has your vision changed? (e.g., complete vision loss, blurred vision, double vision, floaters, etc.)     Blurred vision that comes and goes  2. LOCATION: One or both eyes? If one, ask: Which eye?     Unsure  3. SEVERITY: Can you see anything? If Yes, ask: What can you see? (e.g., fine print)     Patient states he is able to see, it just seems blurry  4. ONSET: When did this begin? Did it start suddenly or has this been gradual?     Yesterday is when he first noticed it  5. PATTERN: Does this come and go, or has it been constant since it started?     Comes and goes-pt states he had a moment this morning where he felt vision was blurred, but it went away; No blurred vision during time  of triage  6. PAIN: Is there any pain in your eye(s)?  (Scale 1-10; or mild, moderate, severe)     No  7. CONTACTS-GLASSES: Do you wear contacts or glasses?     No  8. CAUSE: What do you think is causing this visual problem?     Possibly Diabetes  9. OTHER SYMPTOMS: Do you have any other symptoms? (e.g., confusion, headache, arm or leg weakness, speech problems)     Denies headache, numbness/weakness, and any other symptoms  10. PREGNANCY: Is there any chance you are pregnant? When was your last menstrual period?       NA  Answer Assessment - Initial Assessment Questions Pt states he has been noticing frequent urination starting 1-2 weeks ago and would like to be seen because he's been hearing it may be related to his Diabetes. Pt advised to come in for appt within 24 hours, but no available appts until 01/11/24, appt scheduled.   1. SYMPTOM: What's the main symptom you're concerned about? (e.g., frequency, incontinence)     Urinary frequency-states that he feels like he needs to go almost every 30 mins  2. ONSET: When did the  frequency  start?     1-2 weeks ago  3. PAIN: Is there any pain? If Yes, ask: How bad is it? (Scale: 1-10; mild, moderate, severe)     No pain  4. CAUSE: What do you think is causing the symptoms?  I've heard it could be my Diabetes.  5. OTHER SYMPTOMS: Do you have any other symptoms? (e.g., blood in urine, fever, flank pain, pain with urination)     Pt denies fever, flank pain, pain with urination  6. PREGNANCY: Is there any chance you are pregnant? When was your last menstrual period?     NA  Protocols used: Urinary Symptoms-A-AH, Vision Loss or Change-A-AH  Copied from CRM #[id number]. Topic: Clinical - Red Word Triage >> Jan 07, 2024 10:24 AM Antwanette L wrote: Red Word that prompted transfer to Nurse Triage: Pt is reporting blurred vision and frequent urination. Pt is requesting a diabetic  eye exam

## 2024-01-11 ENCOUNTER — Ambulatory Visit: Admitting: Family Medicine

## 2024-01-11 ENCOUNTER — Encounter: Payer: Self-pay | Admitting: Family Medicine

## 2024-01-11 ENCOUNTER — Ambulatory Visit: Payer: Self-pay

## 2024-01-11 VITALS — BP 139/85 | HR 74 | Ht 73.0 in | Wt 168.0 lb

## 2024-01-11 DIAGNOSIS — R350 Frequency of micturition: Secondary | ICD-10-CM | POA: Diagnosis not present

## 2024-01-11 DIAGNOSIS — E119 Type 2 diabetes mellitus without complications: Secondary | ICD-10-CM

## 2024-01-11 LAB — POCT URINALYSIS DIP (CLINITEK)
Bilirubin, UA: NEGATIVE
Blood, UA: NEGATIVE
Glucose, UA: >=1000 mg/dL — AB
Ketones, POC UA: NEGATIVE mg/dL
Leukocytes, UA: NEGATIVE
Nitrite, UA: NEGATIVE
POC PROTEIN,UA: NEGATIVE
Spec Grav, UA: 1.01 (ref 1.010–1.025)
Urobilinogen, UA: 0.2 U/dL
pH, UA: 6.5 (ref 5.0–8.0)

## 2024-01-11 LAB — GLUCOSE, POCT (MANUAL RESULT ENTRY): POC Glucose: 583 mg/dL — ABNORMAL HIGH (ref 70–99)

## 2024-01-11 MED ORDER — LANCET DEVICE MISC: 1.0000 | 0 refills | Status: AC

## 2024-01-11 MED ORDER — BLOOD GLUCOSE MONITORING SUPPL DEVI: 1.0000 | 0 refills | Status: AC

## 2024-01-11 MED ORDER — INSULIN GLARGINE 100 UNIT/ML SOLOSTAR PEN: 10.0000 [IU] | PEN_INJECTOR | Freq: Every day | SUBCUTANEOUS | 11 refills | Status: AC

## 2024-01-11 MED ORDER — BLOOD GLUCOSE TEST VI STRP: 1.0000 | ORAL_STRIP | 0 refills | Status: DC

## 2024-01-11 MED ORDER — LANCETS MISC: 1.0000 | 0 refills | Status: AC

## 2024-01-11 MED ORDER — INSULIN PEN NEEDLE 32G X 4 MM MISC: 1 refills | Status: AC

## 2024-01-11 NOTE — Patient Instructions (Addendum)
 It was nice to see you today,  We addressed the following topics today: - We are starting you on insulin  to bring your high blood sugar down. It is important to start this to avoid becoming very sick and needing to go to the hospital. - I am prescribing a long-acting insulin  pen. You will inject 10 units under the skin of your stomach every night. - I will show you how to use the pen. You will dial it to 10, clean your skin with an alcohol swab, inject, and hold for 10 seconds. Use a new needle for each injection. - If you are able to pick up your insulin  from the pharmacy today, please return to the clinic before we close at 5 pm, and I can walk you through your first dose. - To help lower your blood sugar, you need to avoid foods and drinks with a lot of sugar. This includes sodas, juice, sweet tea, potatoes, pasta, rice, bread, and sweets like oatmeal pies. - If you need something sweet, try using an artificial sweetener (like Splenda or Equal) or sugar-free candy. - Please try to be more active. Even just starting with walking will help. - We have ordered some blood tests for you today to check your kidney function and make sure your blood is not too acidic.  Have a great day,  Rolan Slain, MD

## 2024-01-11 NOTE — Progress Notes (Unsigned)
   Acute Office Visit  Subjective:     Patient ID: Levander DELENA Finder, male    DOB: [birth year], 53 y.o.   MRN: [id number]  Chief Complaint  Patient presents with  . Urinary Tract Infection    HPI Patient is in today for   Subjective - Reports increased urinary frequency and dry mouth for about two weeks. Denies dysuria. States he feels dehydrated but is not experiencing cramping. - Admits to recent dietary changes, including increased consumption of soda, potatoes, and bread (sandwiches). Also eats oatmeal pies every other day. Reports frequent consumption of grapes and pears. - Notes a sedentary lifestyle, mostly going home and lying around after work. Expresses interest in starting to walk more.  Medications Currently taking Creon  to aid in food digestion. No other regular medications mentioned.  PMH, PSH, FH, Social Hx PMHx: Chronic pancreatitis. Social Hx: Denies drinking coffee. Acknowledged eating out of boredom.  ROS Constitutional: Reports dry mouth, thirst. Denies cramping. GU: Reports polyuria. Denies dysuria.   Assessment and Plan Hyperglycemia, uncontrolled Patient presents with symptoms of polyuria and polydipsia for two weeks. He has a history of chronic pancreatitis and is not currently on any medication for diabetes. Recent dietary history includes increased intake of sugary drinks (soda, tea with sugar), potatoes, bread, and sweets. He denies dysuria. Fingerstick glucose in clinic was 583. The high glucose is likely causing his symptoms of polyuria and polydipsia. Due to the significantly elevated glucose level, there is a risk of diabetic ketoacidosis or hyperosmolar hyperglycemic state. - Start long-acting insulin , 10 units subcutaneously at night. - Patient education provided on insulin  administration using a pen, including site rotation (abdomen), needle disposal, and storage. - Offered to have patient return to clinic today before 5 pm after picking up prescription  for a demonstration on how to administer the first dose. - Labs ordered to check kidney function and bicarbonate levels to rule out acidosis. - Extensive dietary counseling provided. Advised to avoid sugary drinks, potatoes, pasta, rice, bread, and certain fruits like grapes and melons. Encouraged consumption of fruits with higher fiber content like blueberries. Recommended using artificial sweeteners if needed and snacking on sugar-free hard candy. - Discussed the importance of increasing physical activity, such as walking. - Follow-up to be scheduled to review lab results and adjust insulin  as needed.    ROS      Objective:    There were no vitals taken for this visit. {Vitals History (Optional):23777}  Physical Exam  No results found for any visits on 01/11/24.      Assessment & Plan:   There are no diagnoses linked to this encounter.   No follow-ups on file.  Toribio MARLA Slain, MD

## 2024-01-11 NOTE — Telephone Encounter (Signed)
 Copied from CRM #[id number]. Topic: Clinical - Medical Advice >> Jan 11, 2024  4:59 PM Tobias L wrote: Reason for CRM: Patient inquiring if it is okay to eat zero sugar cookies or like a sugar free pecan delight?   Patient states he will also come in as soon as his insulin  is ready. The pharmacy's computer was down when he went to pick up the prescription.   Best call back number: [phone] Reason for Disposition  General information question, no triage required and triager able to answer question  Answer Assessment - Initial Assessment Questions 1. REASON FOR CALL: What is the main reason for your call? or How can I best help you?     Patient would like to know if it's ok for him to eat zero sugar cookies or candy. Also inquiring if it's ok to have diet pepsi.   2. SYMPTOMS : Do you have any symptoms?      No  3. OTHER QUESTIONS: Do you have any other questions?     No  Protocols used: Information Only Call - No Triage-A-AH

## 2024-01-12 ENCOUNTER — Telehealth: Payer: Self-pay

## 2024-01-12 ENCOUNTER — Other Ambulatory Visit: Payer: Self-pay | Admitting: Family Medicine

## 2024-01-12 MED ORDER — BLOOD GLUCOSE TEST VI STRP: 1.0000 | ORAL_STRIP | 0 refills | Status: AC

## 2024-01-12 NOTE — Telephone Encounter (Signed)
 Copied from CRM #[id number]. Topic: Clinical - Medication Question >> Jan 12, 2024 10:22 AM Rosaria BRAVO wrote: Reason for CRM: Pt plans on coming to the office today around 4:15 to follow up on request from PCP from yesterday regarding medication instructions.

## 2024-01-13 NOTE — Assessment & Plan Note (Signed)
 Patient presents with symptoms of polyuria and polydipsia for two weeks. He has a history of chronic pancreatitis and is not currently on any medication for diabetes. Recent dietary history includes increased intake of sugary drinks (soda, tea with sugar), potatoes, bread, and sweets. He denies dysuria. Fingerstick glucose in clinic was 583.  - Start long-acting insulin , 10 units subcutaneously at night. - Patient education provided on insulin  administration using a pen, including site rotation (abdomen), needle disposal, and storage. - Offered to have patient return to clinic today before 5 pm after picking up prescription for a demonstration on how to administer the first dose. - Labs ordered to check kidney function and bicarbonate levels to rule out acidosis. - Extensive dietary counseling provided. Advised to avoid sugary drinks, potatoes, pasta, rice, bread, and certain fruits like grapes and melons. Encouraged consumption of fruits with higher fiber content like blueberries. Recommended using artificial sweeteners if needed and snacking on sugar-free hard candy. - Discussed the importance of increasing physical activity, such as walking. - Follow-up to be scheduled to review lab results and adjust insulin  as needed.

## 2024-01-22 ENCOUNTER — Other Ambulatory Visit: Payer: Self-pay | Admitting: Family Medicine

## 2024-01-22 DIAGNOSIS — I10 Essential (primary) hypertension: Secondary | ICD-10-CM

## 2024-02-01 DIAGNOSIS — E119 Type 2 diabetes mellitus without complications: Secondary | ICD-10-CM | POA: Diagnosis not present

## 2024-02-01 DIAGNOSIS — H524 Presbyopia: Secondary | ICD-10-CM | POA: Diagnosis not present

## 2024-02-01 DIAGNOSIS — D1730 Benign lipomatous neoplasm of skin and subcutaneous tissue of unspecified sites: Secondary | ICD-10-CM | POA: Diagnosis not present

## 2024-02-01 LAB — OPHTHALMOLOGY REPORT-SCANNED

## 2024-02-12 ENCOUNTER — Other Ambulatory Visit: Payer: Self-pay | Admitting: Family Medicine

## 2024-02-16 ENCOUNTER — Ambulatory Visit (INDEPENDENT_AMBULATORY_CARE_PROVIDER_SITE_OTHER): Admitting: Family Medicine

## 2024-02-16 ENCOUNTER — Encounter: Payer: Self-pay | Admitting: Family Medicine

## 2024-02-16 VITALS — BP 143/81 | HR 62 | Ht 73.0 in | Wt 178.4 lb

## 2024-02-16 DIAGNOSIS — Z794 Long term (current) use of insulin: Secondary | ICD-10-CM | POA: Diagnosis not present

## 2024-02-16 DIAGNOSIS — E119 Type 2 diabetes mellitus without complications: Secondary | ICD-10-CM | POA: Diagnosis not present

## 2024-02-16 LAB — POCT GLYCOSYLATED HEMOGLOBIN (HGB A1C): HbA1c POC (<> result, manual entry): 11.7 %

## 2024-02-16 MED ORDER — FREESTYLE LIBRE 3 PLUS SENSOR MISC: 3 refills | Status: AC

## 2024-02-16 NOTE — Assessment & Plan Note (Signed)
 A1c at 11.7% indicates poor glycemic control. Current insulin  regimen may be insufficient. Consideration for short-acting insulin  with meals. - Prescribed continuous glucose monitor (Freestyle device) for 24-hour glucose tracking. - Ordered fasting C-peptide and pro insulin  levels to assess endogenous insulin  production. - Educated on checking blood glucose 45 minutes postprandially.

## 2024-02-16 NOTE — Progress Notes (Signed)
" ° °  Established Patient Office Visit  Subjective   Patient ID: Evan Sullivan, male    DOB: [birth year]  Age: 54 y.o. MRN: [id number]  Chief Complaint  Patient presents with   Medical Management of Chronic Issues     History of Present Illness   Evan Sullivan is a 54 year old male with diabetes who presents for blood sugar management.  He checks his blood sugar at home once daily, with recent fasting readings around 83-90 mg/dL and post-meal elevations. He uses 10 units of insulin  in the late afternoon and has not adjusted the dose. His last A1c was 11.7%.   He has not had hypoglycemia symptoms or readings below 80 mg/dL and feels well throughout the day and night.          The 10-year ASCVD risk score (Arnett DK, et al., 2019) is: 18.3%  Health Maintenance Due  Topic Date Due   Hepatitis C Screening  Never done   Hepatitis B Vaccines 19-59 Average Risk (1 of 3 - 19+ 3-dose series) Never done   COVID-19 Vaccine (3 - Pfizer risk series) 01/31/2020      Objective:     BP (!) 143/81   Pulse 62   Ht 6' 1 (1.854 m)   Wt 178 lb 6.4 oz (80.9 kg)   SpO2 99%   BMI 23.54 kg/m    Physical Exam     Gen: alert, oriented Pulm: no respiratory distress Psych: pleasant affect       Results for orders placed or performed in visit on 02/16/24  POCT glycosylated hemoglobin (Hb A1C)  Result Value Ref Range   Hemoglobin A1C     HbA1c POC (<> result, manual entry) 11.7 4.0 - 5.6 %   HbA1c, POC (prediabetic range)     HbA1c, POC (controlled diabetic range)          Assessment & Plan:   Type 2 diabetes mellitus treated with insulin  (HCC) Assessment & Plan: A1c at 11.7% indicates poor glycemic control. Current insulin  regimen may be insufficient. Consideration for short-acting insulin  with meals. - Prescribed continuous glucose monitor (Freestyle device) for 24-hour glucose tracking. - Ordered fasting C-peptide and pro insulin  levels to assess endogenous insulin  production. -  Educated on checking blood glucose 45 minutes postprandially.  Orders: -     POCT glycosylated hemoglobin (Hb A1C) -     POCT glucose (manual entry) -     FreeStyle Libre 3 Plus Sensor; Change sensor every 15 days.  Dispense: 6 each; Refill: 3 -     Proinsulin/insulin  ratio -     C-peptide        Return in about 3 months (around 05/16/2024).    Toribio MARLA Slain, MD pro "

## 2024-02-16 NOTE — Patient Instructions (Signed)
 It was nice to see you today,  We addressed the following topics today: - no changes to your insulin  today.  - I have ordered a continuous glucose monitor.  I can show you how to set it up if you need help.   Have a great day,  Rolan Slain, MD

## 2024-02-17 ENCOUNTER — Other Ambulatory Visit: Payer: Self-pay | Admitting: Family Medicine

## 2024-02-17 DIAGNOSIS — E781 Pure hyperglyceridemia: Secondary | ICD-10-CM

## 2024-02-21 ENCOUNTER — Ambulatory Visit: Payer: Self-pay

## 2024-02-21 ENCOUNTER — Telehealth: Payer: Self-pay

## 2024-02-21 NOTE — Telephone Encounter (Signed)
 Evan Sullivan will be here on Wednesday at 8am.

## 2024-02-21 NOTE — Telephone Encounter (Signed)
 Copied from CRM [id number]. Topic: General - Other >> Feb 21, 2024  8:09 AM Mesmerise C wrote: Reason for CRM: Patient got his Freestyle [employer] and Dr. Chandra stated he can come in without a visit to see him so he can get an accurate reading of his A1C stated best to come in at 4-5 inquiring if he's available any earlier

## 2024-02-29 NOTE — Progress Notes (Signed)
 Called [facility] she stated that it's still pending it takes up to 15 days to be resulted

## 2024-03-03 LAB — C-PEPTIDE: C-Peptide: 1 ng/mL — ABNORMAL LOW (ref 1.1–4.4)

## 2024-03-03 LAB — PROINSULIN/INSULIN RATIO
Insulin: 4.4 u[IU]/mL
Proinsulin: 3.4 pmol/L

## 2024-03-06 ENCOUNTER — Telehealth: Payer: Self-pay

## 2024-03-07 ENCOUNTER — Ambulatory Visit: Payer: Self-pay

## 2024-03-07 NOTE — Telephone Encounter (Signed)
 Patient calling back, he put a new Freestyle Libre sensor on his arm but states it is not scanning. RN provided patient with FreeStyle Libre 3's support/trouble shooting from their website. Advised if those tips don't work to call the [employer] customer support # on his box of supplies or go to the pharmacy to have the pharmacist check his placement. Advised to call back for new or worsening symptoms.

## 2024-03-07 NOTE — Telephone Encounter (Signed)
 Called patient he is advised of  the recommendation

## 2024-03-07 NOTE — Telephone Encounter (Signed)
 FYI Only or Action Required?: FYI only for provider: see notes.  Patient was last seen in primary care on 02/16/2024 by Chandra Toribio POUR, MD.  Called Nurse Triage reporting Medication Problem.  Symptoms began today.  Interventions attempted: Nothing.  Symptoms are: stable.  Triage Disposition: No disposition on file.  Patient/caregiver understands and will follow disposition?:   Message from Darshell M sent at 03/07/2024 11:32 AM EST  Summary: Medication Issue   Reason for Triage: Patient's libre device came out and patient want to know if he should use the same one or replace it.         Answer Assessment - Initial Assessment Questions 1. REASON FOR CALL: What is the main reason for your call? or How can I best help you?     Patient reports removed shirt and device fell on the floor. Patient asked if should reapply with device, since needing to replace in 2 days.   Nurse instructed patient to use new device to prevent infection. Instructed to clean arm, prior to applying new device. Patient verbalized understanding.  Reports have enough devices on hand.   2. SYMPTOMS : Do you have any symptoms?     Denies symptoms or any other concerns.  Advised call back  if symptoms occurs/ or any questions/concerns.Patient verbalized understanding.  Protocols used: Information Only Call - No Triage-A-AH

## 2024-05-17 ENCOUNTER — Ambulatory Visit: Admitting: Family Medicine
